# Patient Record
Sex: Female | Born: 1955 | Race: Black or African American | Hispanic: No | Marital: Married | Smoking: Never smoker
Health system: Southern US, Community
[De-identification: ages and names within clinical notes are randomized; demographics above are authoritative.]

## PROBLEM LIST (undated history)

## (undated) DIAGNOSIS — N200 Calculus of kidney: Secondary | ICD-10-CM

## (undated) DIAGNOSIS — E119 Type 2 diabetes mellitus without complications: Secondary | ICD-10-CM

## (undated) DIAGNOSIS — I10 Essential (primary) hypertension: Secondary | ICD-10-CM

## (undated) DIAGNOSIS — E7800 Pure hypercholesterolemia, unspecified: Secondary | ICD-10-CM

## (undated) HISTORY — PX: BREAST EXCISIONAL BIOPSY: SUR124

## (undated) HISTORY — PX: DILATION AND CURETTAGE OF UTERUS: SHX78

---

## 1997-09-10 HISTORY — PX: TUBAL LIGATION: SHX77

## 2000-02-22 ENCOUNTER — Ambulatory Visit (HOSPITAL_COMMUNITY): Admission: RE | Admit: 2000-02-22 | Discharge: 2000-02-22 | Payer: Self-pay | Admitting: Family Medicine

## 2000-02-22 ENCOUNTER — Encounter: Payer: Self-pay | Admitting: Family Medicine

## 2000-02-22 ENCOUNTER — Emergency Department (HOSPITAL_COMMUNITY): Admission: EM | Admit: 2000-02-22 | Discharge: 2000-02-23 | Payer: Self-pay | Admitting: Emergency Medicine

## 2006-08-06 ENCOUNTER — Encounter: Admission: RE | Admit: 2006-08-06 | Discharge: 2006-08-06 | Payer: Self-pay | Admitting: Family Medicine

## 2006-11-29 ENCOUNTER — Encounter: Admission: RE | Admit: 2006-11-29 | Discharge: 2006-11-29 | Payer: Self-pay | Admitting: Family Medicine

## 2007-08-11 ENCOUNTER — Encounter: Admission: RE | Admit: 2007-08-11 | Discharge: 2007-08-11 | Payer: Self-pay | Admitting: Family Medicine

## 2008-08-13 ENCOUNTER — Encounter: Admission: RE | Admit: 2008-08-13 | Discharge: 2008-08-13 | Payer: Self-pay | Admitting: Family Medicine

## 2008-09-27 ENCOUNTER — Ambulatory Visit (HOSPITAL_COMMUNITY): Admission: RE | Admit: 2008-09-27 | Discharge: 2008-09-27 | Payer: Self-pay | Admitting: Family Medicine

## 2008-10-08 ENCOUNTER — Inpatient Hospital Stay (HOSPITAL_COMMUNITY): Admission: AD | Admit: 2008-10-08 | Discharge: 2008-10-13 | Payer: Self-pay | Admitting: Gastroenterology

## 2008-11-06 ENCOUNTER — Emergency Department (HOSPITAL_COMMUNITY): Admission: EM | Admit: 2008-11-06 | Discharge: 2008-11-06 | Payer: Self-pay | Admitting: Emergency Medicine

## 2009-08-16 ENCOUNTER — Encounter: Admission: RE | Admit: 2009-08-16 | Discharge: 2009-08-16 | Payer: Self-pay | Admitting: Family Medicine

## 2010-08-17 ENCOUNTER — Encounter
Admission: RE | Admit: 2010-08-17 | Discharge: 2010-08-17 | Payer: Self-pay | Source: Home / Self Care | Admitting: Family Medicine

## 2010-10-01 ENCOUNTER — Encounter: Payer: Self-pay | Admitting: Family Medicine

## 2010-12-25 LAB — COMPREHENSIVE METABOLIC PANEL
ALT: 1141 U/L — ABNORMAL HIGH (ref 0–35)
ALT: 1400 U/L — ABNORMAL HIGH (ref 0–35)
AST: 1995 U/L — ABNORMAL HIGH (ref 0–37)
AST: 912 U/L — ABNORMAL HIGH (ref 0–37)
Alkaline Phosphatase: 159 U/L — ABNORMAL HIGH (ref 39–117)
Alkaline Phosphatase: 163 U/L — ABNORMAL HIGH (ref 39–117)
BUN: 7 mg/dL (ref 6–23)
CO2: 28 mEq/L (ref 19–32)
Calcium: 8.6 mg/dL (ref 8.4–10.5)
Calcium: 8.7 mg/dL (ref 8.4–10.5)
Chloride: 101 mEq/L (ref 96–112)
Creatinine, Ser: 0.9 mg/dL (ref 0.4–1.2)
GFR calc Af Amer: >60 mL/min (ref >60)
GFR calc Af Amer: >60 mL/min (ref >60)
GFR calc non Af Amer: >60 mL/min (ref >60)
Glucose, Bld: 107 mg/dL — ABNORMAL HIGH (ref 70–99)
Glucose, Bld: 163 mg/dL — ABNORMAL HIGH (ref 70–99)
Potassium: 3.2 mEq/L — ABNORMAL LOW (ref 3.5–5.1)
Potassium: 3.5 mEq/L (ref 3.5–5.1)
Potassium: 3.8 mEq/L (ref 3.5–5.1)
Sodium: 134 mEq/L — ABNORMAL LOW (ref 135–145)
Sodium: 134 mEq/L — ABNORMAL LOW (ref 135–145)
Total Bilirubin: 32.3 mg/dL (ref 0.3–1.2)
Total Bilirubin: 35 mg/dL (ref 0.3–1.2)
Total Protein: 6.9 g/dL (ref 6.0–8.3)
Total Protein: 7.3 g/dL (ref 6.0–8.3)

## 2010-12-25 LAB — ANA: Anti Nuclear Antibody(ANA): NEGATIVE

## 2010-12-25 LAB — CBC
HCT: 39.3 % (ref 36.0–46.0)
Hemoglobin: 13.3 g/dL (ref 12.0–15.0)
Hemoglobin: 13.7 g/dL (ref 12.0–15.0)
MCHC: 34.4 g/dL (ref 30.0–36.0)
MCV: 79.2 fL (ref 78.0–100.0)
RBC: 4.84 MIL/uL (ref 3.87–5.11)
RBC: 5.21 MIL/uL — ABNORMAL HIGH (ref 3.87–5.11)
RDW: 25.5 % — ABNORMAL HIGH (ref 11.5–15.5)
RDW: 26.6 % — ABNORMAL HIGH (ref 11.5–15.5)
WBC: 10.3 10*3/uL (ref 4.0–10.5)

## 2010-12-25 LAB — PROTIME-INR
INR: 1.4 (ref 0.00–1.49)
Prothrombin Time: 17.4 seconds — ABNORMAL HIGH (ref 11.6–15.2)
Prothrombin Time: 18.3 seconds — ABNORMAL HIGH (ref 11.6–15.2)

## 2010-12-25 LAB — APTT: aPTT: 37 seconds (ref 24–37)

## 2010-12-25 LAB — CLOSTRIDIUM DIFFICILE EIA: C difficile Toxins A+B, EIA: NEGATIVE

## 2010-12-25 LAB — AMMONIA: Ammonia: 21 umol/L (ref 11–35)

## 2010-12-26 LAB — COMPREHENSIVE METABOLIC PANEL
ALT: 816 U/L — ABNORMAL HIGH (ref 0–35)
ALT: 822 U/L — ABNORMAL HIGH (ref 0–35)
ALT: 915 U/L — ABNORMAL HIGH (ref 0–35)
AST: 496 U/L — ABNORMAL HIGH (ref 0–37)
AST: 590 U/L — ABNORMAL HIGH (ref 0–37)
AST: 70 U/L — ABNORMAL HIGH (ref 0–37)
Albumin: 2.2 g/dL — ABNORMAL LOW (ref 3.5–5.2)
Albumin: 2.3 g/dL — ABNORMAL LOW (ref 3.5–5.2)
Alkaline Phosphatase: 164 U/L — ABNORMAL HIGH (ref 39–117)
Alkaline Phosphatase: 170 U/L — ABNORMAL HIGH (ref 39–117)
Alkaline Phosphatase: 176 U/L — ABNORMAL HIGH (ref 39–117)
BUN: 14 mg/dL (ref 6–23)
BUN: 14 mg/dL (ref 6–23)
CO2: 25 mEq/L (ref 19–32)
CO2: 26 mEq/L (ref 19–32)
Calcium: 8.4 mg/dL (ref 8.4–10.5)
Calcium: 8.6 mg/dL (ref 8.4–10.5)
Calcium: 9.2 mg/dL (ref 8.4–10.5)
Chloride: 103 mEq/L (ref 96–112)
Chloride: 103 mEq/L (ref 96–112)
Chloride: 100 mEq/L (ref 96–112)
Creatinine, Ser: 1.03 mg/dL (ref 0.4–1.2)
Creatinine, Ser: 1.11 mg/dL (ref 0.4–1.2)
GFR calc Af Amer: 58 mL/min — ABNORMAL LOW (ref >60)
GFR calc Af Amer: >60 mL/min (ref >60)
GFR calc Af Amer: >60 mL/min (ref >60)
GFR calc non Af Amer: 56 mL/min — ABNORMAL LOW (ref >60)
GFR calc non Af Amer: 52 mL/min — ABNORMAL LOW (ref >60)
Glucose, Bld: 170 mg/dL — ABNORMAL HIGH (ref 70–99)
Glucose, Bld: 199 mg/dL — ABNORMAL HIGH (ref 70–99)
Glucose, Bld: 228 mg/dL — ABNORMAL HIGH (ref 70–99)
Glucose, Bld: 478 mg/dL — ABNORMAL HIGH (ref 70–99)
Potassium: 3.3 mEq/L — ABNORMAL LOW (ref 3.5–5.1)
Potassium: 3.3 mEq/L — ABNORMAL LOW (ref 3.5–5.1)
Potassium: 3.7 mEq/L (ref 3.5–5.1)
Sodium: 134 mEq/L — ABNORMAL LOW (ref 135–145)
Sodium: 134 mEq/L — ABNORMAL LOW (ref 135–145)
Total Bilirubin: 19.4 mg/dL (ref 0.3–1.2)
Total Bilirubin: 23.2 mg/dL (ref 0.3–1.2)
Total Bilirubin: 5.5 mg/dL — ABNORMAL HIGH (ref 0.3–1.2)
Total Protein: 6.8 g/dL (ref 6.0–8.3)
Total Protein: 6.9 g/dL (ref 6.0–8.3)

## 2010-12-26 LAB — DIFFERENTIAL
Basophils Absolute: 0 10*3/uL (ref 0.0–0.1)
Basophils Relative: 0 % (ref 0–1)
Eosinophils Absolute: 0 10*3/uL (ref 0.0–0.7)
Eosinophils Absolute: 0.1 10*3/uL (ref 0.0–0.7)
Eosinophils Relative: 0 % (ref 0–5)
Eosinophils Relative: 1 % (ref 0–5)
Lymphocytes Relative: 5 % — ABNORMAL LOW (ref 12–46)
Lymphocytes Relative: 23 % (ref 12–46)
Lymphs Abs: 1.1 10*3/uL (ref 0.7–4.0)
Lymphs Abs: 3.5 10*3/uL (ref 0.7–4.0)
Monocytes Absolute: 0.5 10*3/uL (ref 0.1–1.0)
Monocytes Relative: 2 % — ABNORMAL LOW (ref 3–12)
Neutro Abs: 21.1 10*3/uL — ABNORMAL HIGH (ref 1.7–7.7)
Neutrophils Relative %: 93 % — ABNORMAL HIGH (ref 43–77)
Neutrophils Relative %: 70 % (ref 43–77)

## 2010-12-26 LAB — URINALYSIS, ROUTINE W REFLEX MICROSCOPIC
Bilirubin Urine: NEGATIVE
Glucose, UA: >1000 mg/dL — AB
Hgb urine dipstick: NEGATIVE
Specific Gravity, Urine: 1.024 (ref 1.005–1.030)
Urobilinogen, UA: 2 mg/dL — ABNORMAL HIGH (ref 0.0–1.0)

## 2010-12-26 LAB — GLUCOSE, CAPILLARY
Glucose-Capillary: 160 mg/dL — ABNORMAL HIGH (ref 70–99)
Glucose-Capillary: 177 mg/dL — ABNORMAL HIGH (ref 70–99)
Glucose-Capillary: 268 mg/dL — ABNORMAL HIGH (ref 70–99)
Glucose-Capillary: 273 mg/dL — ABNORMAL HIGH (ref 70–99)
Glucose-Capillary: 291 mg/dL — ABNORMAL HIGH (ref 70–99)
Glucose-Capillary: 467 mg/dL — ABNORMAL HIGH (ref 70–99)

## 2010-12-26 LAB — PROTEIN ELECTROPH W RFLX QUANT IMMUNOGLOBULINS
Alpha-1-Globulin: 4.1 % (ref 2.9–4.9)
Beta 2: 6 % (ref 3.2–6.5)
Beta Globulin: 4.8 % (ref 4.7–7.2)
Gamma Globulin: 26.2 % — ABNORMAL HIGH (ref 11.1–18.8)

## 2010-12-26 LAB — CBC
HCT: 41 % (ref 36.0–46.0)
HCT: 33.2 % — ABNORMAL LOW (ref 36.0–46.0)
Hemoglobin: 14.2 g/dL (ref 12.0–15.0)
Hemoglobin: 14.2 g/dL (ref 12.0–15.0)
Hemoglobin: 11.7 g/dL — ABNORMAL LOW (ref 12.0–15.0)
MCHC: 34.3 g/dL (ref 30.0–36.0)
MCHC: 34.7 g/dL (ref 30.0–36.0)
MCHC: 34.7 g/dL (ref 30.0–36.0)
MCHC: 35.3 g/dL (ref 30.0–36.0)
MCV: 79.3 fL (ref 78.0–100.0)
MCV: 79.7 fL (ref 78.0–100.0)
MCV: 88.7 fL (ref 78.0–100.0)
Platelets: 381 10*3/uL (ref 150–400)
Platelets: 392 10*3/uL (ref 150–400)
RBC: 5.15 MIL/uL — ABNORMAL HIGH (ref 3.87–5.11)
RBC: 3.74 MIL/uL — ABNORMAL LOW (ref 3.87–5.11)
RDW: 27.4 % — ABNORMAL HIGH (ref 11.5–15.5)
RDW: 27.5 % — ABNORMAL HIGH (ref 11.5–15.5)
RDW: 27.5 % — ABNORMAL HIGH (ref 11.5–15.5)
WBC: 17.3 10*3/uL — ABNORMAL HIGH (ref 4.0–10.5)
WBC: 22.7 10*3/uL — ABNORMAL HIGH (ref 4.0–10.5)
WBC: 15.3 10*3/uL — ABNORMAL HIGH (ref 4.0–10.5)

## 2010-12-26 LAB — GIARDIA/CRYPTOSPORIDIUM SCREEN(EIA)
Cryptosporidium Screen (EIA): NEGATIVE
Giardia Screen - EIA: NEGATIVE

## 2010-12-26 LAB — CLOSTRIDIUM DIFFICILE EIA

## 2010-12-26 LAB — PROTIME-INR
INR: 1.2 (ref 0.00–1.49)
Prothrombin Time: 15.3 seconds — ABNORMAL HIGH (ref 11.6–15.2)

## 2010-12-26 LAB — URINE MICROSCOPIC-ADD ON

## 2011-01-23 NOTE — H&P (Signed)
Dana Thornton              ACCOUNT NO.:  [id number]   MEDICAL RECORD NO.:  [id number]          PATIENT TYPE:  INP   LOCATION:  5531                         FACILITY:  [facility]   PHYSICIAN:  Shirley Friar, MDDATE OF BIRTH:  [birth year]   DATE OF ADMISSION:  10/08/2008  DATE OF DISCHARGE:                              HISTORY & PHYSICAL   Dana Thornton is being admitted today.  She is a 55 year old female who  describes jaundice, abdominal pain, and extreme fatigue for about the  past 10 days to 2 weeks.  The patient reports abdominal pain only when  she has diarrhea.  She has been having about 5 diarrhea bowel movements  a day.  Several days ago, she was vomiting most all of her food, but is  able to keep some done now.  She denies any hematemesis as well as  hematochezia or melena.  She describes increased continuous nausea and  extreme fatigue.  She tells me her urine has been dark for about a week  and a half.  She denies any confusion whatsoever.   She was originally seen in the [facility] office by Dr. Madilyn Fireman on  October 04, 2008.  At that point in time, he found her LFTs to be  elevated with her transaminases in the 2000 and 3000 range.  Her  bilirubin was approximately 16.9.  On October 06, 2008, he retook her  LFTs.  AST was 2500, AST 1675, and total bilirubin 31.5.  Hepatitis  panel was negative.   PAST MEDICAL HISTORY:  Significant for hypertension, hypothyroidism,  hyperlipidemia, and diverticulosis.   CURRENT MEDICATIONS:  Vitamin D, Synthroid, Lipitor, and Accuretic.   She has an allergy to CODEINE.   REVIEW OF SYSTEMS:  As per HPI.  She denies any fever, palpitations, or  shortness of breath.   SOCIAL HISTORY:  Negative for alcohol, negative for tobacco, negative  for drugs.  She tells me that she specifically has never used IV drugs.   FAMILY HISTORY:  Negative for any history of liver disease or  gallbladder disease.   PHYSICAL EXAMINATION:  GENERAL:   She is alert and oriented, appears  fatigued.  HEENT:  Her eyes are icteric 3+.  CARDIOVASCULAR SYSTEM:  Regular rate and rhythm with no murmurs, rubs,  or gallops.  LUNGS:  Clear to auscultation anteriorly.  ABDOMEN:  Obese, soft, nontender, and nondistended with good bowel  sounds.  EXTREMITIES:  She has no lower extremity edema.   LABORATORY STUDIES:  From October 06, 2008, she has a smooth muscle  antibody that is positive at 37.  She has an ammonia level of 103 and  amylase 41.  Hepatitis panel negative.  Hemoglobin 13.9, hematocrit  40.7, white count 9.4, and platelets 274,000.  BMET within normal limits  including BUN of 8 and creatinine 0.9.  LFTs on October 06, 2008, show  AST 2564, ALT 1675, alk phos 205, and total bilirubin 31.5.  On October 04, 2008, her PT was 14.5, INR 1.4, and PTT 32.  Also, her iron sat was  44, TIBC 381, and serum iron  166.  She had an abdominal ultrasound on  September 27, 2008.  Her gallbladder was markedly thickened and irregular.  She had increased renal cortical echogenicity.  The radiologist states  that is consistent with medical parenchymal disease.   ASSESSMENT:  Dr. Charlott Rakes has seen and examined the patient,  collected a history, and reviewed her chart.  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  She had a recent development of jaundice,  markedly elevated transaminases, and rise in bilirubin, found to have an  elevated actin(anti-smooth muscle antibody) level.  Due to the degree of  her hyperbilirubinemia, I have chosen to admit her for IV steroids as  initial Rx for presumed autoimmune hepatitis.  She has evidence of  rising ammonia level, but no signs of encephalopathy thus far.      Stephani Police, [place]      Shirley Friar, MD  Electronically Signed    MLY/MEDQ  D:  10/08/2008  T:  10/09/2008  Job:  [id number]   cc:   Everardo All. Madilyn Fireman, M.D.

## 2011-01-23 NOTE — Consult Note (Signed)
NAMERYDER, CHESMORE              ACCOUNT NO.:  [id number]   MEDICAL RECORD NO.:  [id number]          PATIENT TYPE:  EMS   LOCATION:  MAJO                         FACILITY:  [facility]   PHYSICIAN:  Corinna L. Lendell Caprice, MDDATE OF BIRTH:  1955/12/15   DATE OF CONSULTATION:  11/06/2008  DATE OF DISCHARGE:  11/06/2008                                 CONSULTATION   CHIEF COMPLAINT:  High sugar.   REQUESTING PHYSICIAN:  Marisa Severin, MD   REASON FOR CONSULTATION:  Hyperglycemia.   IMPRESSION AND RECOMMENDATIONS:  1. Steroid-induced diabetes:  The patient is not in diabetic      ketoacidosis and she is currently fluid repleted.  Her sugars are      improved and she is requesting to go home with close outpatient      followup, I think, this is reasonable.  I will give her a dose of      Lantus as well as IV regular insulin and give her a prescription      for Januvia.  I have asked that she monitor for hypoglycemia and to      follow up with Dr. Valentina Lucks as soon as possible.  She is feeling      better and clinically at this point, does not appear dehydrated.  I      have recommended that she cut down on concentrated sweets and      continue her glimepiride 4 mg twice a day.  2. Autoimmune hepatitis.  3. Hypertension.  Hold Accuretic until blood sugars stabilize.   HISTORY OF PRESENT ILLNESS:  Ms. Berhe is a pleasant 55 year old black  female who was recently diagnosed with autoimmune hepatitis and has been  on prednisone.  She has some baseline labs drawn in Dr. Madilyn Fireman' office on  Wednesday and was told to follow up with Dr. Valentina Lucks.  Apparently, her  blood sugar was above 400.  She was started on glimepiride 4 mg twice a  day 2 days ago.  She had been on prednisone 60 mg a day, which was  decreased to 20 mg yesterday.  She has not taken her glimepiride today.  She had polyuria, polydipsia, blurry vision, dry mouth, but currently  these symptoms have abated.  Apparently, her blood sugars  have been  running 400 at home and she monitors her blood sugars twice daily.  She  has an appointment set up with Dr. Valentina Lucks on Wednesday.   PAST MEDICAL HISTORY:  As above.   MEDICATIONS:  Synthroid, prednisone, glimepiride, mercaptopurine, and  Accuretic.   SOCIAL HISTORY:  She works for [employer].  She does not smoke.  She denies  drug use or heavy alcohol use.   FAMILY HISTORY:  Noncontributory.   REVIEW OF SYSTEMS:  As above, otherwise negative.   PHYSICAL EXAMINATION:  VITAL SIGNS:  Temperature is 98, blood pressure  132/88, pulse 100, respiratory rate 18, and oxygen saturation 100%.  GENERAL:  The patient is an obese black female, in no acute distress.  HEENT:  Normocephalic, atraumatic.  Pupils equal, round, and reactive to  light.  She has icteric sclerae.  She has moist mucous membranes.  NECK:  Supple.  No lymphadenopathy.  LUNGS:  Clear to auscultation bilaterally without wheezes, rhonchi, or  rales.  CARDIOVASCULAR:  Regular rhythm, borderline tachycardiac.  No murmurs,  gallops, or rubs.  ABDOMEN:  Obese, soft, nontender.  GU AND RECTAL:  Deferred.  EXTREMITIES:  No clubbing, cyanosis, or edema.  SKIN:  No rash.  PSYCHIATRIC:  The patient is calm and cooperative with normal affect.  NEUROLOGIC:  Alert and oriented.  Cranial nerves and sensory motor exam  are intact.   LABORATORY DATA:  White count is 15,000 with a normal differential.  Hemoglobin 11.7, hematocrit 33.2.  Sodium is 134, potassium 3.9,  chloride 100, bicarbonate 26, glucose was 478, BUN 24, creatinine 1.1,  total bilirubin 5.5, alkaline phosphatase 190, SGOT 70, SGPT 127,  albumin 3.2.  blood acetone negative.  Urinalysis greater than 1000  glucose, specific gravity 1.024, urobilinogen 2, otherwise negative.  EKG shows sinus tachycardia with a rate of 105.   The patient voices understanding and I have given her my card, should  she run into problems this weekend.      Corinna L. Lendell Caprice,  MD  Electronically Signed     CLS/MEDQ  D:  11/06/2008  T:  11/06/2008  Job:  [id number]   cc:   Gretta Arab. Valentina Lucks, M.D.  John C. Madilyn Fireman, M.D.

## 2011-01-23 NOTE — Discharge Summary (Signed)
Dana Thornton, Dana Thornton              ACCOUNT NO.:  [id number]   MEDICAL RECORD NO.:  [id number]          PATIENT TYPE:  INP   LOCATION:  5531                         FACILITY:  [facility]   PHYSICIAN:  John C. Madilyn Fireman, M.D.    DATE OF BIRTH:  May 09, 1956   DATE OF ADMISSION:  10/08/2008  DATE OF DISCHARGE:  10/13/2008                               DISCHARGE SUMMARY   Dana Thornton was admitted to [facility] on October 08, 2008.  She is being discharged today, October 13, 2008.   DISCHARGE DIAGNOSES:  1. Autoimmune hepatitis.  2. Hypertension.   CONSULTS:  None.   PROCEDURES:  None.   RADIOLOGICAL EXAMS:  None.   PERTINENT LABORATORY DATA:  On the date of admission, Dana Thornton's AST  was 1995, her ALT was 1400, her alk phos was 159, and her total  bilirubin was 32.3.  From the office, she had an elevated actin level of  37.  Her ANA in the hospital was negative.   DISCHARGE LABORATORY DATA:  An AST of 496, ALT 816, alk phos 176, and  total bilirubin 19.  Her CBC today showed a white count of 19.6,  hemoglobin 14.2, hematocrit 41.0, and platelets 398,000.  Glucose ran  between 175 and 200 throughout the admission, her potassium today is  3.7, BUN 20, and creatinine 1.19.   HISTORY AND BRIEF HOSPITAL COURSE:  Dana Thornton is a very pleasant 55-  year-old female who is a patient of Dr. Dorena Cookey at [facility] as  well as Dr. Maurice Small at [facility].  She had been  complaining of jaundice, abdominal pain, and extreme fatigue for about  10 days to 2 weeks.  She has seen Dr. Madilyn Fireman multiple times in the office  just 2-4 days prior to admission and he had been drawing labs, found her  LFTs to be markedly elevated and found her actin level to be elevated,  at which time he admitted her to the hospital for therapy with IV  steroids for autoimmune hepatitis.  She was admitted to the hospital,  still having a good deal of nausea.  She was having multiple loose bowel  movements daily.  Stool cultures were taken.  Giardia and  Cryptosporidium was negative.  C. diff was negative x3.  Over the course  of her hospitalization, her stools firmed up in decreased in number.  She was also extremely nauseated and felt weak and tired on admission.  Over the course of her hospital stay, her nausea dissipated and she  regained some strength.  She was able to tolerate a p.o. diet  immediately.  By the date of discharge, she was able to tolerate all  p.o. medications.  Her admission was rather noneventful in that no  procedures were done.  However, she was treated with 80 mg of Solu-  Medrol IV t.i.d. and her labs immediately started to improve.  It was  noted, however, that her CBG ran between 75 and the low 200s.  She was  started on insulin for this.  On the date of discharge, the patient  was  stable in good condition.  She had no abdominal pain.  She was  ambulating well.  Her bowel movements had normalized.  Her eyes were  still very icteric.  She never showed signs of encephalopathy during  this hospital admission.  She has no lower extremity edema.  Denies any  abdominal pain and is eating well.  She is being discharged to home in  stable condition.  She will have close followup with [facility].  Her  first appointment for labs will be on October 15, 2008, this Friday,  following that she has an office visit with Dr. Dorena Cookey on October 25, 2008.  We will plan for her to return to work approximately on  October 26, 2008.   DISCHARGE MEDICATIONS:  Prednisone 60 mg orally once a day until she is  seen by Dr. Madilyn Fireman.  Her Lipitor is being held.  Her vitamin D is being  held.  Accuretic blood pressure medication 20-12.5 twice a day by mouth  and lisinopril 20 mg once a day by mouth.   The patient has been advised to call our office with any increased  abdominal pain or vomiting prior to her office visit.      Stephani Police, PA     ______________________________  Everardo All Madilyn Fireman, M.D.    MLY/MEDQ  D:  10/13/2008  T:  10/14/2008  Job:  [id number]   cc:   Gretta Arab. Valentina Lucks, M.D.  John C. Madilyn Fireman, M.D.

## 2011-08-22 ENCOUNTER — Other Ambulatory Visit: Payer: Self-pay | Admitting: Family Medicine

## 2011-08-22 DIAGNOSIS — Z1231 Encounter for screening mammogram for malignant neoplasm of breast: Secondary | ICD-10-CM

## 2011-08-30 ENCOUNTER — Ambulatory Visit
Admission: RE | Admit: 2011-08-30 | Discharge: 2011-08-30 | Disposition: A | Discharge status: Home / Self Care | Payer: 59 | Source: Ambulatory Visit | Attending: Family Medicine | Admitting: Family Medicine

## 2011-08-30 DIAGNOSIS — Z1231 Encounter for screening mammogram for malignant neoplasm of breast: Secondary | ICD-10-CM

## 2011-12-02 ENCOUNTER — Other Ambulatory Visit: Payer: Self-pay

## 2011-12-02 ENCOUNTER — Emergency Department (HOSPITAL_COMMUNITY): Payer: 59

## 2011-12-02 ENCOUNTER — Encounter (HOSPITAL_COMMUNITY): Payer: Self-pay | Admitting: Emergency Medicine

## 2011-12-02 ENCOUNTER — Inpatient Hospital Stay (HOSPITAL_COMMUNITY)
Admission: EM | Admit: 2011-12-02 | Discharge: 2011-12-10 | DRG: 208 | Disposition: A | Discharge status: Home Health | Payer: 59 | Source: Ambulatory Visit | Attending: Internal Medicine | Admitting: Internal Medicine

## 2011-12-02 DIAGNOSIS — J690 Pneumonitis due to inhalation of food and vomit: Secondary | ICD-10-CM | POA: Diagnosis present

## 2011-12-02 DIAGNOSIS — K759 Inflammatory liver disease, unspecified: Secondary | ICD-10-CM

## 2011-12-02 DIAGNOSIS — J9600 Acute respiratory failure, unspecified whether with hypoxia or hypercapnia: Principal | ICD-10-CM | POA: Diagnosis present

## 2011-12-02 DIAGNOSIS — N179 Acute kidney failure, unspecified: Secondary | ICD-10-CM | POA: Diagnosis present

## 2011-12-02 DIAGNOSIS — E1165 Type 2 diabetes mellitus with hyperglycemia: Secondary | ICD-10-CM | POA: Diagnosis present

## 2011-12-02 DIAGNOSIS — J9601 Acute respiratory failure with hypoxia: Secondary | ICD-10-CM | POA: Diagnosis present

## 2011-12-02 DIAGNOSIS — K754 Autoimmune hepatitis: Secondary | ICD-10-CM | POA: Diagnosis present

## 2011-12-02 DIAGNOSIS — Z9119 Patient's noncompliance with other medical treatment and regimen: Secondary | ICD-10-CM

## 2011-12-02 DIAGNOSIS — E1310 Other specified diabetes mellitus with ketoacidosis without coma: Secondary | ICD-10-CM | POA: Diagnosis present

## 2011-12-02 DIAGNOSIS — E669 Obesity, unspecified: Secondary | ICD-10-CM | POA: Diagnosis present

## 2011-12-02 DIAGNOSIS — D696 Thrombocytopenia, unspecified: Secondary | ICD-10-CM | POA: Diagnosis present

## 2011-12-02 DIAGNOSIS — E119 Type 2 diabetes mellitus without complications: Secondary | ICD-10-CM | POA: Diagnosis present

## 2011-12-02 DIAGNOSIS — Z6841 Body Mass Index (BMI) 40.0 and over, adult: Secondary | ICD-10-CM

## 2011-12-02 DIAGNOSIS — K7682 Hepatic encephalopathy: Secondary | ICD-10-CM | POA: Diagnosis present

## 2011-12-02 DIAGNOSIS — IMO0002 Reserved for concepts with insufficient information to code with codable children: Secondary | ICD-10-CM | POA: Diagnosis present

## 2011-12-02 DIAGNOSIS — K7290 Hepatic failure, unspecified without coma: Secondary | ICD-10-CM | POA: Diagnosis present

## 2011-12-02 DIAGNOSIS — E1159 Type 2 diabetes mellitus with other circulatory complications: Secondary | ICD-10-CM | POA: Diagnosis present

## 2011-12-02 DIAGNOSIS — T380X5A Adverse effect of glucocorticoids and synthetic analogues, initial encounter: Secondary | ICD-10-CM | POA: Diagnosis present

## 2011-12-02 DIAGNOSIS — E870 Hyperosmolality and hypernatremia: Secondary | ICD-10-CM | POA: Diagnosis present

## 2011-12-02 DIAGNOSIS — Z91199 Patient's noncompliance with other medical treatment and regimen due to unspecified reason: Secondary | ICD-10-CM

## 2011-12-02 DIAGNOSIS — I10 Essential (primary) hypertension: Secondary | ICD-10-CM | POA: Diagnosis present

## 2011-12-02 DIAGNOSIS — E860 Dehydration: Secondary | ICD-10-CM

## 2011-12-02 DIAGNOSIS — R739 Hyperglycemia, unspecified: Secondary | ICD-10-CM

## 2011-12-02 DIAGNOSIS — N19 Unspecified kidney failure: Secondary | ICD-10-CM

## 2011-12-02 DIAGNOSIS — E1110 Type 2 diabetes mellitus with ketoacidosis without coma: Secondary | ICD-10-CM

## 2011-12-02 DIAGNOSIS — R17 Unspecified jaundice: Secondary | ICD-10-CM | POA: Diagnosis present

## 2011-12-02 HISTORY — DX: Essential (primary) hypertension: I10

## 2011-12-02 LAB — POCT I-STAT 3, ART BLOOD GAS (G3+)
Patient temperature: 34.7
pCO2 arterial: 45.5 mmHg — ABNORMAL HIGH (ref 35.0–45.0)
pH, Arterial: 7.345 — ABNORMAL LOW (ref 7.350–7.400)

## 2011-12-02 LAB — CARDIAC PANEL(CRET KIN+CKTOT+MB+TROPI): Total CK: 795 U/L — ABNORMAL HIGH (ref 7–177)

## 2011-12-02 LAB — COMPREHENSIVE METABOLIC PANEL
ALT: 143 U/L — ABNORMAL HIGH (ref 0–35)
BUN: 84 mg/dL — ABNORMAL HIGH (ref 6–23)
CO2: 21 mEq/L (ref 19–32)
Calcium: 9.5 mg/dL (ref 8.4–10.5)
Creatinine, Ser: 3.59 mg/dL — ABNORMAL HIGH (ref 0.50–1.10)
GFR calc Af Amer: 15 mL/min — ABNORMAL LOW (ref >90)
GFR calc non Af Amer: 13 mL/min — ABNORMAL LOW (ref >90)
Glucose, Bld: 1055 mg/dL (ref 70–99)
Sodium: 151 mEq/L — ABNORMAL HIGH (ref 135–145)
Total Protein: 7.8 g/dL (ref 6.0–8.3)

## 2011-12-02 LAB — CBC
HCT: 44.8 % (ref 36.0–46.0)
Hemoglobin: 14.4 g/dL (ref 12.0–15.0)
MCH: 29 pg (ref 26.0–34.0)
MCHC: 32.1 g/dL (ref 30.0–36.0)
MCV: 90.3 fL (ref 78.0–100.0)
RBC: 4.96 MIL/uL (ref 3.87–5.11)

## 2011-12-02 LAB — DIFFERENTIAL
Basophils Relative: 0 % (ref 0–1)
Eosinophils Absolute: 0 10*3/uL (ref 0.0–0.7)
Eosinophils Relative: 0 % (ref 0–5)
Lymphs Abs: 2.3 10*3/uL (ref 0.7–4.0)
Monocytes Absolute: 0.5 10*3/uL (ref 0.1–1.0)
Neutro Abs: 22.6 10*3/uL — ABNORMAL HIGH (ref 1.7–7.7)

## 2011-12-02 LAB — URINALYSIS, ROUTINE W REFLEX MICROSCOPIC
Leukocytes, UA: NEGATIVE
Nitrite: NEGATIVE
Specific Gravity, Urine: 1.026 (ref 1.005–1.030)
Urobilinogen, UA: 0.2 mg/dL (ref 0.0–1.0)
pH: 5.5 (ref 5.0–8.0)

## 2011-12-02 LAB — GLUCOSE, CAPILLARY
Glucose-Capillary: >600 mg/dL (ref 70–99)
Glucose-Capillary: >600 mg/dL (ref 70–99)

## 2011-12-02 LAB — URINE MICROSCOPIC-ADD ON

## 2011-12-02 LAB — LACTIC ACID, PLASMA: Lactic Acid, Venous: 6 mmol/L — ABNORMAL HIGH (ref 0.5–2.2)

## 2011-12-02 MED ORDER — LORAZEPAM 2 MG/ML IJ SOLN
INTRAMUSCULAR | Status: AC
  Filled 2011-12-02: qty 1

## 2011-12-02 MED ORDER — DEXTROSE-NACL 5-0.45 % IV SOLN: INTRAVENOUS | Status: DC

## 2011-12-02 MED ORDER — SODIUM CHLORIDE 0.9 % IV BOLUS (SEPSIS)
1000.0000 mL | Freq: Once | INTRAVENOUS | Status: AC
  Administered 2011-12-02: 1000 mL via INTRAVENOUS

## 2011-12-02 MED ORDER — SODIUM CHLORIDE 0.9 % IV SOLN
INTRAVENOUS | Status: AC
  Administered 2011-12-02: 5.4 [IU]/h via INTRAVENOUS
  Filled 2011-12-02 (×2): qty 1

## 2011-12-02 MED ORDER — INSULIN REGULAR BOLUS VIA INFUSION
0.0000 [IU] | Freq: Three times a day (TID) | INTRAVENOUS | Status: DC
  Administered 2011-12-02: 10 [IU] via INTRAVENOUS
  Filled 2011-12-02: qty 10

## 2011-12-02 MED ORDER — INSULIN ASPART 100 UNIT/ML ~~LOC~~ SOLN
SUBCUTANEOUS | Status: AC
  Filled 2011-12-02: qty 1

## 2011-12-02 MED ORDER — SODIUM CHLORIDE 0.9 % IV SOLN
Freq: Once | INTRAVENOUS | Status: AC
  Administered 2011-12-03: 500 mL via INTRAVENOUS

## 2011-12-02 MED ORDER — DEXTROSE 50 % IV SOLN: 25.0000 mL | INTRAVENOUS | Status: DC | PRN

## 2011-12-02 MED ORDER — LORAZEPAM 2 MG/ML IJ SOLN
2.0000 mg | Freq: Once | INTRAMUSCULAR | Status: AC
  Administered 2011-12-02: 2 mg via INTRAVENOUS

## 2011-12-02 MED ORDER — SODIUM CHLORIDE 0.9 % IV SOLN
INTRAVENOUS | Status: DC
  Administered 2011-12-02: 23:00:00 via INTRAVENOUS

## 2011-12-02 NOTE — ED Notes (Signed)
Pt moaning, lethargic,arousable  to painful stimulation,  Pupils equal and regular and sluggish to reaction, open eyes to pain, pt on 100% NRB. Unable to pick sat, ext cold, foley temp 92.0, pt placed on warm blanket.pt MAE x4,

## 2011-12-02 NOTE — ED Notes (Signed)
Pt on bear hunger now

## 2011-12-02 NOTE — ED Provider Notes (Addendum)
History     CSN: [id number]  Arrival date & time 12/02/11  2201   First MD Initiated Contact with Patient 12/02/11 2205      Chief Complaint  Patient presents with  . Hyperglycemia    (Consider location/radiation/quality/duration/timing/severity/associated sxs/prior treatment) HPI Comments: Hx obtained per husband.  Pt has had 3-4 day hx of nausea/vomiting/upper abd pain and yellowing of her eyes.  Has a hx of autoimmune hepatitis and steroid induced DM.  Saw her PMD 2 days ago, given rx for vicodin and phenergan.  Has not taken much of the medicine.  Got worse today, was not responding well, has not been eating or drinking, not talking.  No hx of fevers at home.  No headaches.  Mild cold symptoms.  No hx of neck or back pain per husband.  The history is provided by the spouse. The history is limited by the condition of the patient.    Past Medical History  Diagnosis Date  . Diabetes mellitus   . Hypertension     History reviewed. No pertinent past surgical history.  No family history on file.  History  Substance Use Topics  . Smoking status: Not on file  . Smokeless tobacco: Not on file  . Alcohol Use:     OB History    Grav Para Term Preterm Abortions TAB SAB Ect Mult Living                  Review of Systems  Unable to perform ROS Eyes: Negative.     Allergies  Review of patient's allergies indicates no known allergies.  Home Medications   Current Outpatient Rx  Name Route Sig Dispense Refill  . HYDROCODONE-ACETAMINOPHEN 5-325 MG PO TABS Oral Take 1 tablet by mouth every 4 (four) hours as needed. For pain    . PROMETHAZINE HCL 25 MG PO TABS Oral Take 25 mg by mouth every 4 (four) hours as needed. For nausea from pain meds.      BP 145/85  Pulse 117  Temp(Src) 97.2 F (36.2 C) (Other (Comment))  Resp 20  SpO2 100%  Physical Exam  Constitutional: She appears well-developed and well-nourished.       Pt awake with eyes open, will follow commands, but  no verbal response  HENT:  Head: Normocephalic and atraumatic.       Dry mucus membranes  Eyes: Pupils are equal, round, and reactive to light. Scleral icterus is present.  Neck: Normal range of motion. Neck supple.  Cardiovascular: Normal rate, regular rhythm and normal heart sounds.   No murmur heard. Pulmonary/Chest: Effort normal and breath sounds normal. No respiratory distress. She has no wheezes. She has no rales. She exhibits no tenderness.  Abdominal: Soft. Bowel sounds are normal. There is no tenderness. There is no rebound and no guarding.  Musculoskeletal: Normal range of motion. She exhibits no edema.  Lymphadenopathy:    She has no cervical adenopathy.  Neurological: She is alert.       Will follow simple commands.  Moves all extremities symmetrically.  No facial droop, can stick out tongue, normal gag.  Noncomprehensible verbal response  Skin: Skin is warm and dry. No rash noted. She is not diaphoretic.  Psychiatric: She has a normal mood and affect.    ED Course  Procedures (including critical care time)  Results for orders placed during the hospital encounter of 12/02/11  CBC      Component Value Range   WBC 25.4 (*) 4.0 -  10.5 (K/uL)   RBC 4.96  3.87 - 5.11 (MIL/uL)   Hemoglobin 14.4  12.0 - 15.0 (g/dL)   HCT 40.9  81.1 - 91.4 (%)   MCV 90.3  78.0 - 100.0 (fL)   MCH 29.0  26.0 - 34.0 (pg)   MCHC 32.1  30.0 - 36.0 (g/dL)   RDW 78.2 (*) 95.6 - 15.5 (%)   Platelets 297  150 - 400 (K/uL)  DIFFERENTIAL      Component Value Range   Neutrophils Relative 89 (*) 43 - 77 (%)   Lymphocytes Relative 9 (*) 12 - 46 (%)   Monocytes Relative 2 (*) 3 - 12 (%)   Eosinophils Relative 0  0 - 5 (%)   Basophils Relative 0  0 - 1 (%)   Neutro Abs 22.6 (*) 1.7 - 7.7 (K/uL)   Lymphs Abs 2.3  0.7 - 4.0 (K/uL)   Monocytes Absolute 0.5  0.1 - 1.0 (K/uL)   Eosinophils Absolute 0.0  0.0 - 0.7 (K/uL)   Basophils Absolute 0.0  0.0 - 0.1 (K/uL)   WBC Morphology MILD LEFT SHIFT (1-5%  METAS, OCC MYELO, OCC BANDS)    COMPREHENSIVE METABOLIC PANEL      Component Value Range   Sodium 151 (*) 135 - 145 (mEq/L)   Potassium 5.6 (*) 3.5 - 5.1 (mEq/L)   Chloride 109  96 - 112 (mEq/L)   CO2 21  19 - 32 (mEq/L)   Glucose, Bld 1055 (*) 70 - 99 (mg/dL)   BUN 84 (*) 6 - 23 (mg/dL)   Creatinine, Ser 2.13 (*) 0.50 - 1.10 (mg/dL)   Calcium 9.5  8.4 - 08.6 (mg/dL)   Total Protein 7.8  6.0 - 8.3 (g/dL)   Albumin 3.4 (*) 3.5 - 5.2 (g/dL)   AST 47 (*) 0 - 37 (U/L)   ALT 143 (*) 0 - 35 (U/L)   Alkaline Phosphatase 172 (*) 39 - 117 (U/L)   Total Bilirubin 2.3 (*) 0.3 - 1.2 (mg/dL)   GFR calc non Af Amer 13 (*) >90 (mL/min)   GFR calc Af Amer 15 (*) >90 (mL/min)  URINALYSIS, ROUTINE W REFLEX MICROSCOPIC      Component Value Range   Color, Urine YELLOW  YELLOW    APPearance CLOUDY (*) CLEAR    Specific Gravity, Urine 1.026  1.005 - 1.030    pH 5.5  5.0 - 8.0    Glucose, UA >1000 (*) NEGATIVE (mg/dL)   Hgb urine dipstick LARGE (*) NEGATIVE    Bilirubin Urine SMALL (*) NEGATIVE    Ketones, ur NEGATIVE  NEGATIVE (mg/dL)   Protein, ur 578 (*) NEGATIVE (mg/dL)   Urobilinogen, UA 0.2  0.0 - 1.0 (mg/dL)   Nitrite NEGATIVE  NEGATIVE    Leukocytes, UA NEGATIVE  NEGATIVE   PREGNANCY, URINE      Component Value Range   Preg Test, Ur NEGATIVE  NEGATIVE   CARDIAC PANEL(CRET KIN+CKTOT+MB+TROPI)      Component Value Range   Total CK 795 (*) 7 - 177 (U/L)   CK, MB 2.7  0.3 - 4.0 (ng/mL)   Troponin I <0.30  <0.30 (ng/mL)   Relative Index 0.3  0.0 - 2.5   LACTIC ACID, PLASMA      Component Value Range   Lactic Acid, Venous 6.0 (*) 0.5 - 2.2 (mmol/L)  GLUCOSE, CAPILLARY      Component Value Range   Glucose-Capillary >600 (*) 70 - 99 (mg/dL)   Comment 1 Documented in Chart  Comment 2 Notify RN    POCT I-STAT 3, BLOOD GAS (G3+)      Component Value Range   pH, Arterial 7.345 (*) 7.350 - 7.400    pCO2 arterial 45.5 (*) 35.0 - 45.0 (mmHg)   pO2, Arterial 369.0 (*) 80.0 - 100.0 (mmHg)    Bicarbonate 25.5 (*) 20.0 - 24.0 (mEq/L)   TCO2 27  0 - 100 (mmol/L)   O2 Saturation 100.0     Acid-base deficit 1.0  0.0 - 2.0 (mmol/L)   Patient temperature 34.7 C     Collection site BRACHIAL ARTERY     Drawn by RT     Sample type ARTERIAL    URINE MICROSCOPIC-ADD ON      Component Value Range   Squamous Epithelial / LPF FEW (*) RARE    RBC / HPF 3-6  <3 (RBC/hpf)   Bacteria, UA MANY (*) RARE    Casts GRANULAR CAST (*) NEGATIVE    Urine-Other AMORPHOUS MATERIAL PRESENT    GLUCOSE, CAPILLARY      Component Value Range   Glucose-Capillary >600 (*) 70 - 99 (mg/dL)   Ct Head Wo Contrast  12/02/2011  *RADIOLOGY REPORT*  Clinical Data: 56 year old female with altered mental status.  CT HEAD WITHOUT CONTRAST  Technique:  Contiguous axial images were obtained from the base of the skull through the vertex without contrast.  Comparison: None  Findings: No acute intracranial abnormalities are identified, including mass lesion or mass effect, hydrocephalus, extra-axial fluid collection, midline shift, hemorrhage, or acute infarction.  The visualized bony calvarium is unremarkable.  IMPRESSION: No evidence of acute intracranial abnormality.  Original Report Authenticated By: Rosendo Gros, M.D.   Dg Chest Port 1 View  12/02/2011  *RADIOLOGY REPORT*  Clinical Data: 56 year old female with altered mental status.  PORTABLE CHEST - 1 VIEW  Comparison: None  Findings: Upper limits normal heart size and mild pulmonary vascular congestion noted. There is no evidence of focal airspace disease, pulmonary edema, suspicious pulmonary nodule/mass, pleural effusion, or pneumothorax. No acute bony abnormalities are identified.  IMPRESSION: Upper limits normal heart size and mild pulmonary vascular congestion.  Original Report Authenticated By: Rosendo Gros, M.D.      Date: 12/03/2011  Rate: 119  Rhythm: normal sinus rhythm and sinus tachycardia  QRS Axis: normal  Intervals: normal  ST/T Wave abnormalities:  nonspecific ST/T changes  Conduction Disutrbances:none  Narrative Interpretation:   Old EKG Reviewed: changes noted      1. Hyperglycemia   2. Renal failure   3. Dehydration       MDM  Pt with critically high BS, not acidodic, but lactate high.  Mild elevation in liver enzymes.  Was hypothermic on arrival, but temp improving with [employer].  Started IV fluid replacement.  Starting to have some UOP, about 200cc now.  Given 2 liter bolus, now at 150cc/hr given some pulmonary vascular congestion CXR.  On glucose stabilizer orders.  Pt controlling airway, but still confused.  No hx of fevers, or other recent illness to suggest sepsis.  No hypotension.  Feel that pt is more likely sick from hyperosmolar, nonketotic hyperglycemia.  In ARF with hypothermia, dehydration.  Will consult hospitalist for admission. Will cover pt with abx  Spoke with intensivist, will see pt in ED.  Requests CT abd/pelvis which I ordered.  I attempted CVL, but unsuccessful, pt combative.  Given ativan for combative behavior and fentanyl for pain during procedure.  CRITICAL CARE Performed by: Rolan Bucco  Total critical care time: 60  Critical care time was exclusive of separately billable procedures and treating other patients.  Critical care was necessary to treat or prevent imminent or life-threatening deterioration.  Critical care was time spent personally by me on the following activities: development of treatment plan with patient and/or surrogate as well as nursing, discussions with consultants, evaluation of patient's response to treatment, examination of patient, obtaining history from patient or surrogate, ordering and performing treatments and interventions, ordering and review of laboratory studies, ordering and review of radiographic studies, pulse oximetry and re-evaluation of patient's condition.         Rolan Bucco, MD 12/03/11 0030  Rolan Bucco, MD 12/03/11 1610  Rolan Bucco, MD 12/03/11 [id number]

## 2011-12-02 NOTE — ED Notes (Addendum)
Critical glucose level 1055mg /dL called Md notified

## 2011-12-02 NOTE — ED Notes (Signed)
Patient transported to CT 

## 2011-12-02 NOTE — ED Notes (Signed)
Pt husband stated pt has been unresponsive expect to pain for the last 8hrs., has not eaten or drink anything for more than 2DAYS, CBG >600 .

## 2011-12-02 NOTE — ED Notes (Signed)
Pt very restless, pulling thing out, more confused, trashing in bed,  MD notified in room now assessing pt.

## 2011-12-03 ENCOUNTER — Other Ambulatory Visit: Payer: Self-pay

## 2011-12-03 ENCOUNTER — Encounter (HOSPITAL_COMMUNITY): Payer: Self-pay | Admitting: Emergency Medicine

## 2011-12-03 ENCOUNTER — Inpatient Hospital Stay (HOSPITAL_COMMUNITY): Payer: 59

## 2011-12-03 ENCOUNTER — Emergency Department (HOSPITAL_COMMUNITY): Payer: 59

## 2011-12-03 DIAGNOSIS — K754 Autoimmune hepatitis: Secondary | ICD-10-CM | POA: Diagnosis present

## 2011-12-03 DIAGNOSIS — E1069 Type 1 diabetes mellitus with other specified complication: Secondary | ICD-10-CM

## 2011-12-03 DIAGNOSIS — E1165 Type 2 diabetes mellitus with hyperglycemia: Secondary | ICD-10-CM | POA: Diagnosis present

## 2011-12-03 DIAGNOSIS — R7309 Other abnormal glucose: Secondary | ICD-10-CM

## 2011-12-03 DIAGNOSIS — E1065 Type 1 diabetes mellitus with hyperglycemia: Secondary | ICD-10-CM

## 2011-12-03 DIAGNOSIS — J9600 Acute respiratory failure, unspecified whether with hypoxia or hypercapnia: Secondary | ICD-10-CM

## 2011-12-03 DIAGNOSIS — J9601 Acute respiratory failure with hypoxia: Secondary | ICD-10-CM | POA: Diagnosis present

## 2011-12-03 DIAGNOSIS — E860 Dehydration: Secondary | ICD-10-CM

## 2011-12-03 LAB — BLOOD GAS, ARTERIAL
Drawn by: 34779
MECHVT: 400 mL
O2 Saturation: 97.9 %
PEEP: 5 cmH2O
Patient temperature: 97.9
RATE: 14 resp/min

## 2011-12-03 LAB — POCT I-STAT, CHEM 8
BUN: 79 mg/dL — ABNORMAL HIGH (ref 6–23)
Hemoglobin: 13.3 g/dL (ref 12.0–15.0)
Potassium: 3.6 mEq/L (ref 3.5–5.1)
Sodium: 166 mEq/L (ref 135–145)
TCO2: 21 mmol/L (ref 0–100)

## 2011-12-03 LAB — SEDIMENTATION RATE: Sed Rate: 23 mm/hr — ABNORMAL HIGH (ref 0–22)

## 2011-12-03 LAB — COMPREHENSIVE METABOLIC PANEL
Albumin: 2.8 g/dL — ABNORMAL LOW (ref 3.5–5.2)
BUN: 76 mg/dL — ABNORMAL HIGH (ref 6–23)
Chloride: 129 mEq/L — ABNORMAL HIGH (ref 96–112)
Creatinine, Ser: 2.84 mg/dL — ABNORMAL HIGH (ref 0.50–1.10)
GFR calc Af Amer: 20 mL/min — ABNORMAL LOW (ref >90)
Glucose, Bld: 168 mg/dL — ABNORMAL HIGH (ref 70–99)
Total Bilirubin: 1.5 mg/dL — ABNORMAL HIGH (ref 0.3–1.2)
Total Protein: 6.9 g/dL (ref 6.0–8.3)

## 2011-12-03 LAB — BASIC METABOLIC PANEL
CO2: 25 mEq/L (ref 19–32)
Calcium: 8.5 mg/dL (ref 8.4–10.5)
Calcium: 8.4 mg/dL (ref 8.4–10.5)
Chloride: 130 mEq/L — ABNORMAL HIGH (ref 96–112)
Chloride: 128 mEq/L — ABNORMAL HIGH (ref 96–112)
Creatinine, Ser: 2.52 mg/dL — ABNORMAL HIGH (ref 0.50–1.10)
GFR calc Af Amer: 24 mL/min — ABNORMAL LOW (ref >90)
GFR calc Af Amer: 28 mL/min — ABNORMAL LOW (ref >90)
GFR calc non Af Amer: 21 mL/min — ABNORMAL LOW (ref >90)
GFR calc non Af Amer: 24 mL/min — ABNORMAL LOW (ref >90)
Potassium: 4.5 mEq/L (ref 3.5–5.1)
Potassium: 4.1 mEq/L (ref 3.5–5.1)
Sodium: 164 mEq/L (ref 135–145)
Sodium: 162 mEq/L (ref 135–145)
Sodium: 161 mEq/L (ref 135–145)

## 2011-12-03 LAB — GLUCOSE, CAPILLARY
Glucose-Capillary: >600 mg/dL (ref 70–99)
Glucose-Capillary: 310 mg/dL — ABNORMAL HIGH (ref 70–99)
Glucose-Capillary: 149 mg/dL — ABNORMAL HIGH (ref 70–99)
Glucose-Capillary: 187 mg/dL — ABNORMAL HIGH (ref 70–99)
Glucose-Capillary: 108 mg/dL — ABNORMAL HIGH (ref 70–99)
Glucose-Capillary: 141 mg/dL — ABNORMAL HIGH (ref 70–99)
Glucose-Capillary: 187 mg/dL — ABNORMAL HIGH (ref 70–99)

## 2011-12-03 LAB — MAGNESIUM: Magnesium: 2.8 mg/dL — ABNORMAL HIGH (ref 1.5–2.5)

## 2011-12-03 LAB — MRSA PCR SCREENING: MRSA by PCR: NEGATIVE

## 2011-12-03 LAB — KETONES, QUALITATIVE: Acetone, Bld: NEGATIVE

## 2011-12-03 LAB — PHOSPHORUS: Phosphorus: 3.1 mg/dL (ref 2.3–4.6)

## 2011-12-03 LAB — LACTIC ACID, PLASMA: Lactic Acid, Venous: 1.2 mmol/L (ref 0.5–2.2)

## 2011-12-03 MED ORDER — MIDAZOLAM HCL 2 MG/2ML IJ SOLN: 2.0000 mg | Freq: Once | INTRAMUSCULAR | Status: DC

## 2011-12-03 MED ORDER — ETOMIDATE 2 MG/ML IV SOLN
INTRAVENOUS | Status: AC
  Filled 2011-12-03: qty 20

## 2011-12-03 MED ORDER — MIDAZOLAM HCL 2 MG/2ML IJ SOLN
2.0000 mg | INTRAMUSCULAR | Status: DC | PRN
  Administered 2011-12-03: 2 mg via INTRAVENOUS

## 2011-12-03 MED ORDER — MIDAZOLAM HCL 2 MG/2ML IJ SOLN
INTRAMUSCULAR | Status: AC
  Administered 2011-12-03: 01:00:00
  Filled 2011-12-03: qty 2

## 2011-12-03 MED ORDER — INSULIN ASPART 100 UNIT/ML ~~LOC~~ SOLN
0.0000 [IU] | SUBCUTANEOUS | Status: DC
  Administered 2011-12-03: 5 [IU] via SUBCUTANEOUS
  Administered 2011-12-03: 11 [IU] via SUBCUTANEOUS
  Administered 2011-12-04 (×2): 15 [IU] via SUBCUTANEOUS

## 2011-12-03 MED ORDER — OSMOLITE 1.5 CAL PO LIQD
1000.0000 mL | ORAL | Status: DC
  Administered 2011-12-03 – 2011-12-04 (×2): 1000 mL
  Filled 2011-12-03 (×4): qty 1000

## 2011-12-03 MED ORDER — PANTOPRAZOLE SODIUM 40 MG IV SOLR
40.0000 mg | Freq: Every day | INTRAVENOUS | Status: DC
  Administered 2011-12-03: 40 mg via INTRAVENOUS
  Filled 2011-12-03 (×3): qty 40

## 2011-12-03 MED ORDER — SODIUM CHLORIDE 0.9 % IV BOLUS (SEPSIS)
1000.0000 mL | Freq: Once | INTRAVENOUS | Status: AC
  Administered 2011-12-03: 1000 mL via INTRAVENOUS

## 2011-12-03 MED ORDER — PROPOFOL 10 MG/ML IV EMUL
5.0000 ug/kg/min | Freq: Once | INTRAVENOUS | Status: AC
  Administered 2011-12-03: 50 ug/kg/min via INTRAVENOUS
  Filled 2011-12-03: qty 20

## 2011-12-03 MED ORDER — FENTANYL BOLUS VIA INFUSION
50.0000 ug | Freq: Four times a day (QID) | INTRAVENOUS | Status: DC | PRN
Start: 2011-12-03 — End: 2011-12-03
  Filled 2011-12-03: qty 100

## 2011-12-03 MED ORDER — FENTANYL CITRATE 0.05 MG/ML IJ SOLN
INTRAMUSCULAR | Status: AC
  Filled 2011-12-03: qty 2

## 2011-12-03 MED ORDER — SODIUM CHLORIDE 0.9 % IV SOLN
50.0000 ug/h | INTRAVENOUS | Status: DC
  Administered 2011-12-03: 50 ug/h via INTRAVENOUS
  Filled 2011-12-03: qty 50

## 2011-12-03 MED ORDER — PROPOFOL 10 MG/ML IV EMUL
INTRAVENOUS | Status: AC
  Filled 2011-12-03: qty 20

## 2011-12-03 MED ORDER — PIPERACILLIN-TAZOBACTAM 3.375 G IVPB
3.3750 g | Freq: Three times a day (TID) | INTRAVENOUS | Status: DC
  Administered 2011-12-03 – 2011-12-05 (×7): 3.375 g via INTRAVENOUS
  Filled 2011-12-03 (×9): qty 50

## 2011-12-03 MED ORDER — BIOTENE DRY MOUTH MT LIQD
15.0000 mL | Freq: Four times a day (QID) | OROMUCOSAL | Status: DC
  Administered 2011-12-03 – 2011-12-10 (×22): 15 mL via OROMUCOSAL

## 2011-12-03 MED ORDER — SODIUM CHLORIDE 0.45 % IV SOLN
INTRAVENOUS | Status: DC
  Administered 2011-12-03 (×2): via INTRAVENOUS
  Administered 2011-12-04: 125 mL/h via INTRAVENOUS
  Administered 2011-12-04: 06:00:00 via INTRAVENOUS
  Administered 2011-12-04: 125 mL/h via INTRAVENOUS

## 2011-12-03 MED ORDER — LORAZEPAM 2 MG/ML IJ SOLN
INTRAMUSCULAR | Status: AC
  Filled 2011-12-03: qty 1

## 2011-12-03 MED ORDER — SODIUM CHLORIDE 0.9 % IV SOLN
Freq: Once | INTRAVENOUS | Status: AC
  Administered 2011-12-03: 2000 mL via INTRAVENOUS

## 2011-12-03 MED ORDER — SUCCINYLCHOLINE CHLORIDE 20 MG/ML IJ SOLN
INTRAMUSCULAR | Status: AC
  Filled 2011-12-03: qty 10

## 2011-12-03 MED ORDER — FENTANYL CITRATE 0.05 MG/ML IJ SOLN
50.0000 ug | Freq: Once | INTRAMUSCULAR | Status: AC
  Administered 2011-12-03: 50 ug via INTRAVENOUS

## 2011-12-03 MED ORDER — CHLORHEXIDINE GLUCONATE 0.12 % MT SOLN
15.0000 mL | Freq: Two times a day (BID) | OROMUCOSAL | Status: DC
  Administered 2011-12-03 – 2011-12-05 (×5): 15 mL via OROMUCOSAL
  Filled 2011-12-03 (×5): qty 15

## 2011-12-03 MED ORDER — PIPERACILLIN-TAZOBACTAM 3.375 G IVPB
3.3750 g | Freq: Once | INTRAVENOUS | Status: AC
  Administered 2011-12-03: 3.375 g via INTRAVENOUS
  Filled 2011-12-03: qty 50

## 2011-12-03 MED ORDER — PROPOFOL 10 MG/ML IV EMUL
5.0000 ug/kg/min | INTRAVENOUS | Status: DC
  Administered 2011-12-03 (×2): 30 ug/kg/min via INTRAVENOUS
  Administered 2011-12-03: 25 ug/kg/min via INTRAVENOUS
  Administered 2011-12-04 (×2): 35 ug/kg/min via INTRAVENOUS
  Administered 2011-12-04: 40 ug/kg/min via INTRAVENOUS
  Administered 2011-12-04: 35 ug/kg/min via INTRAVENOUS
  Administered 2011-12-05: 10 ug/kg/min via INTRAVENOUS
  Filled 2011-12-03 (×10): qty 100

## 2011-12-03 MED ORDER — PROPOFOL 10 MG/ML IV EMUL: 5.0000 ug/kg/min | Freq: Once | INTRAVENOUS | Status: DC

## 2011-12-03 MED ORDER — ROCURONIUM BROMIDE 50 MG/5ML IV SOLN
INTRAVENOUS | Status: AC
  Filled 2011-12-03: qty 2

## 2011-12-03 MED ORDER — SODIUM CHLORIDE 0.9 % IV SOLN: Freq: Once | INTRAVENOUS | Status: DC

## 2011-12-03 MED ORDER — HEPARIN SODIUM (PORCINE) 5000 UNIT/ML IJ SOLN
5000.0000 [IU] | Freq: Three times a day (TID) | INTRAMUSCULAR | Status: DC
  Administered 2011-12-03 – 2011-12-08 (×16): 5000 [IU] via SUBCUTANEOUS
  Filled 2011-12-03 (×18): qty 1

## 2011-12-03 MED ORDER — FENTANYL BOLUS VIA INFUSION
50.0000 ug | Freq: Four times a day (QID) | INTRAVENOUS | Status: DC | PRN
  Filled 2011-12-03: qty 100

## 2011-12-03 MED ORDER — METHYLPREDNISOLONE SODIUM SUCC 40 MG IJ SOLR
40.0000 mg | Freq: Two times a day (BID) | INTRAMUSCULAR | Status: DC
  Administered 2011-12-03 – 2011-12-09 (×13): 40 mg via INTRAVENOUS
  Filled 2011-12-03 (×17): qty 1

## 2011-12-03 MED ORDER — LIDOCAINE HCL (CARDIAC) 20 MG/ML IV SOLN
INTRAVENOUS | Status: AC
  Filled 2011-12-03: qty 5

## 2011-12-03 MED ORDER — SODIUM CHLORIDE 0.9 % IV SOLN
50.0000 ug/h | INTRAVENOUS | Status: DC
  Administered 2011-12-04: 100 ug/h via INTRAVENOUS
  Filled 2011-12-03 (×2): qty 50

## 2011-12-03 MED ORDER — SODIUM CHLORIDE 0.9 % IV SOLN: 250.0000 mL | INTRAVENOUS | Status: DC | PRN

## 2011-12-03 MED ORDER — NEPRO/CARBSTEADY PO LIQD
1000.0000 mL | ORAL | Status: DC
  Filled 2011-12-03: qty 1000

## 2011-12-03 MED ORDER — SODIUM CHLORIDE 0.9 % IV SOLN
INTRAVENOUS | Status: DC
  Administered 2011-12-03: 6.9 [IU]/h via INTRAVENOUS
  Filled 2011-12-03: qty 1

## 2011-12-03 MED ORDER — HEPARIN SODIUM (PORCINE) 5000 UNIT/ML IJ SOLN: 5000.0000 [IU] | Freq: Three times a day (TID) | INTRAMUSCULAR | Status: DC

## 2011-12-03 MED ORDER — MIDAZOLAM HCL 2 MG/2ML IJ SOLN
INTRAMUSCULAR | Status: AC
  Filled 2011-12-03: qty 2

## 2011-12-03 MED ORDER — FENTANYL CITRATE 0.05 MG/ML IJ SOLN: 50.0000 ug | INTRAMUSCULAR | Status: DC | PRN

## 2011-12-03 MED ORDER — PRO-STAT SUGAR FREE PO LIQD
30.0000 mL | Freq: Two times a day (BID) | ORAL | Status: DC
  Administered 2011-12-04 (×2): 30 mL
  Filled 2011-12-03 (×7): qty 30

## 2011-12-03 MED ORDER — MIDAZOLAM HCL 2 MG/2ML IJ SOLN: 2.0000 mg | INTRAMUSCULAR | Status: DC | PRN

## 2011-12-03 MED ORDER — INSULIN GLARGINE 100 UNIT/ML ~~LOC~~ SOLN
10.0000 [IU] | Freq: Every day | SUBCUTANEOUS | Status: DC
  Administered 2011-12-03: 10 [IU] via SUBCUTANEOUS

## 2011-12-03 MED ORDER — SUCCINYLCHOLINE CHLORIDE 20 MG/ML IJ SOLN
INTRAMUSCULAR | Status: AC
  Administered 2011-12-03: 100 mg
  Filled 2011-12-03: qty 10

## 2011-12-03 MED ORDER — SODIUM CHLORIDE 0.9 % IV SOLN
INTRAVENOUS | Status: DC
  Administered 2011-12-03: 04:00:00 via INTRAVENOUS

## 2011-12-03 MED ORDER — LORAZEPAM 2 MG/ML IJ SOLN
1.0000 mg | Freq: Once | INTRAMUSCULAR | Status: AC
Start: 2011-12-03 — End: 2011-12-03
  Administered 2011-12-03: 1 mg via INTRAVENOUS

## 2011-12-03 MED ORDER — VANCOMYCIN HCL IN DEXTROSE 1-5 GM/200ML-% IV SOLN
1000.0000 mg | Freq: Once | INTRAVENOUS | Status: AC
  Administered 2011-12-03: 1000 mg via INTRAVENOUS
  Filled 2011-12-03: qty 200

## 2011-12-03 NOTE — Progress Notes (Signed)
INITIAL ADULT NUTRITION ASSESSMENT Date: 12/03/2011   Time: 12:25 PM  Reason for Assessment: Consult- TF Initiation/Management  ASSESSMENT: Female 56 y.o.  Dx: hyperglycemia, ARF  Hx:  Past Medical History  Diagnosis Date  . Diabetes mellitus   . Hypertension     Related Meds:     . sodium chloride   Intravenous Once  . sodium chloride   Intravenous Once  . sodium chloride   Intravenous Once  . antiseptic oral rinse  15 mL Mouth Rinse QID  . chlorhexidine  15 mL Mouth Rinse BID  . etomidate      . fentaNYL      . fentaNYL  50 mcg Intravenous Once  . heparin subcutaneous  5,000 Units Subcutaneous Q8H  . insulin aspart      . insulin (NOVOLIN-R) infusion   Intravenous To Major  . lidocaine (cardiac) 100 mg/35ml      . LORazepam      . LORazepam      . LORazepam  1 mg Intravenous Once  . LORazepam  2 mg Intravenous Once  . methylPREDNISolone (SOLU-MEDROL) injection  40 mg Intravenous Q12H  . midazolam      . midazolam      . pantoprazole (PROTONIX) IV  40 mg Intravenous QHS  . piperacillin-tazobactam (ZOSYN)  IV  3.375 g Intravenous Once  . piperacillin-tazobactam (ZOSYN)  IV  3.375 g Intravenous Q8H  . propofol  5-70 mcg/kg/min Intravenous Once  . rocuronium      . sodium chloride  1,000 mL Intravenous Once  . succinylcholine      . vancomycin  1,000 mg Intravenous Once  . DISCONTD: heparin  5,000 Units Subcutaneous Q8H  . DISCONTD: insulin regular  0-10 Units Intravenous TID WC  . DISCONTD: midazolam  2-4 mg Intravenous Once  . DISCONTD: propofol  5-70 mcg/kg/min Intravenous Once    Ht: 5\' 2"  (157.5 cm)  Wt: 225 lb 15.5 oz (102.5 kg)  Ideal Wt: 50 kg % Ideal Wt: 205%  Usual Wt: 220 lb (per pt husband)  % Usual Wt: 102%  Body mass index is 41.33 kg/(m^2). Obesity Class III  Food/Nutrition Related Hx: 3-4 days PTA, pt had n/v with upper abdominal pain, per H&P. Per pt husband, pt with decreased appetite for >3 days PTA. Pt only able to consume small  amounts of juice and soup. Pt husband states pt did not check her blood sugar at home; pt was given insulin set last year but pt has never used it.  Labs:  CMP     Component Value Date/Time   NA 166* 12/03/2011 0542   K 3.7 12/03/2011 0542   CL 129* 12/03/2011 0542   CO2 26 12/03/2011 0542   GLUCOSE 168* 12/03/2011 0542   BUN 76* 12/03/2011 0542   CREATININE 2.84* 12/03/2011 0542   CALCIUM 8.7 12/03/2011 0542   PROT 6.9 12/03/2011 0542   ALBUMIN 2.8* 12/03/2011 0542   AST 42* 12/03/2011 0542   ALT 122* 12/03/2011 0542   ALKPHOS 145* 12/03/2011 0542   BILITOT 1.5* 12/03/2011 0542   GFRNONAA 18* 12/03/2011 0542   GFRAA 20* 12/03/2011 0542  Magnesium: 2.8 on 3/25 (high) Phosphorus: 3.1 on 3/25 (WNL) Noted LFT's and Na elevated.  CBG (last 3)   Basename 12/03/11 0702 12/03/11 0614 12/03/11 0515  GLUCAP 149* 187* 233*     Intake/Output Summary (Last 24 hours) at 12/03/11 1236 Last data filed at 12/03/11 1000  Gross per 24 hour  Intake 2647.01 ml  Output    485 ml  Net 2162.01 ml    Diet Order: NPO  Supplements/Tube Feeding: Nepro Carb Steady @ 30 ml/hr Tip of OG tube is seen to be 3 cm above the carina.  IVF:    sodium chloride Last Rate: 100 mL/hr (12/03/11 0800)  feeding supplement (NEPRO CARB STEADY)   fentaNYL infusion INTRAVENOUS Last Rate: 50 mcg/hr (12/03/11 1000)  insulin (NOVOLIN-R) infusion Last Rate: Stopped (12/03/11 0900)  propofol Last Rate: 30 mcg/kg/min (12/03/11 0800)  DISCONTD: sodium chloride Last Rate: 150 mL/hr at 12/02/11 2238  DISCONTD: sodium chloride Last Rate: Stopped (12/03/11 4401)  DISCONTD: dextrose 5 % and 0.45% NaCl   DISCONTD: fentaNYL infusion INTRAVENOUS Last Rate: 50 mcg/hr (12/03/11 0347)    Estimated Nutritional Needs:   Kcal: 1683 Protein:60-80 grams Fluid: 1.5 L  Propofol currently running at 12 ml/hr, providing 317 kcals.  No plans to extubate soon as MS will not support, per Dr. Tyson Alias note. 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   Noted pt with +2 and +1 pitting edema of extremities.  NUTRITION DIAGNOSIS: -Inadequate oral intake (NI-2.1).  Status: Ongoing  RELATED TO: inability to eat  AS EVIDENCE BY: NPO status  MONITORING/EVALUATION(Goals): Goal: EN to provide 60-70% of estimated calorie needs based on ASPEN guidelines for permissive underfeeding of the critically ill. Monitor: TF tolerance/adequacy, renal labs, weight trend, need for vent support, propofol rate  EDUCATION NEEDS: -No education needs identified at this time  INTERVENTION:  Discontinue Nepro Carb Steady  Initiate Osmolite 1.5 via OG tube continuously @ 20 ml/hr with 30 ml Prostat BID. TF and Propofol will provide 1181 kcals (70% estimated kcal needs), 75 grams protein (100% estimated protein needs), and 365 ml free water daily.   Recommend ordering adult multi vitamin as pt will not receive 100% RDI's from current TF rate.  Dietitian #:[phone]  DOCUMENTATION CODES Per approved criteria  -Morbid Obesity    Karenann Cai 12/03/2011, 12:25 PM  Hettie Holstein [phone]

## 2011-12-03 NOTE — Procedures (Signed)
Central Venous Catheter Insertion Procedure Note Dana Thornton [id number] 02-12-1956  Procedure: Insertion of Central Venous Catheter Indications: Assessment of intravascular volume, Drug and/or fluid administration and Frequent blood sampling  Procedure Details Consent: Unable to obtain consent because of altered level of consciousness. Time Out: Verified patient identification, verified procedure, site/side was marked, verified correct patient position, special equipment/implants available, medications/allergies/relevent history reviewed, required imaging and test results available.  Performed  Maximum sterile technique was used including antiseptics, cap, gloves, gown, hand hygiene, mask and sheet. Skin prep: Chlorhexidine; local anesthetic administered A antimicrobial bonded/coated triple lumen catheter was placed in the right subclavian vein using the Seldinger technique.  Evaluation Blood flow good Complications: No apparent complications Patient did tolerate procedure well. Chest X-ray ordered to verify placement.  CXR: Catheter needs to be pulled about 4 cm  Overton Mam, M.D. Pulmonary and Critical Care Medicine Call E-link with questions [phone] 12/03/2011, 6:10 AM

## 2011-12-03 NOTE — Progress Notes (Signed)
ANTIBIOTIC CONSULT NOTE - INITIAL  Pharmacy Consult for Zosyn Indication: rule out sepsis  No Known Allergies  Patient Measurements: Height: 5\' 2"  (157.5 cm) Weight: 220 lb (99.791 kg) IBW/kg (Calculated) : 50.1   Vital Signs: Temp: 97.3 F (36.3 C) (03/25 0145) Temp src: Other (Comment) (03/24 2235) BP: 93/70 mmHg (03/25 0225) Pulse Rate: 107  (03/25 0225) Intake/Output from previous day: 03/24 0701 - 03/25 0700 In: 1500 [I.V.:1500] Out: -  Intake/Output from this shift: Total I/O In: 1500 [I.V.:1500] Out: -   Labs:  Basename 12/03/11 0202 12/02/11 2211  WBC -- 25.4*  HGB 13.3 14.4  PLT -- 297  LABCREA -- --  CREATININE 3.20* 3.59*   Estimated Creatinine Clearance: 22 ml/min (by C-G formula based on Cr of 3.2).  Medical History: Past Medical History  Diagnosis Date  . Diabetes mellitus   . Hypertension     Medications:  Vicodin  Phenergan  Assessment: 56 yo female with hyperglycemia, VDRF for empiric antibiotics  Plan:  Zosyn 3.375 g IV q8h F/U rernal function  Clerance Umland, Gary Fleet 12/03/2011,2:37 AM

## 2011-12-03 NOTE — ED Notes (Signed)
100 mg Succy IV push and 80 mg propofol given IV push at this time. Anethesthia RN and MD,  RT, at bedside for assistance. Pulmonologist to intubate.

## 2011-12-03 NOTE — ED Notes (Signed)
CVC placed by CCM. Right chest. Dressing applied .

## 2011-12-03 NOTE — Progress Notes (Signed)
CRITICAL VALUE ALERT  Critical value received:  Na=161  Date of notification:  12/03/11  Time of notification:  2217  Critical value read back:yes  Nurse who received alert:  Dustin Flock  MD notified (1st page):  Dr Marin Shutter  Time of first page: 2245  MD notified (2nd page):  Time of second page:  Responding MD:  Dr Charlynn Court  Time MD responded:  2245 Value consistent with previous results

## 2011-12-03 NOTE — Progress Notes (Signed)
CRITICAL VALUE ALERT  Critical value received:  Na=166  Date of notification:  12/03/11 Time of notification:  0629  Critical value read back:yes  Nurse who received alert:  Dustin Flock  MD notified (1st page):  Dr Deterding  Time of first page:  0630  MD notified (2nd page):  Time of second page:  Responding MD:  E. Deterding  Time MD responded:  0630

## 2011-12-03 NOTE — H&P (Signed)
. Name: Dana Thornton MRN: [id number] DOB: [birth year]    LOS: 1  PCCM ADMISSION NOTE  History of Present Illness:  56 yr old DM / HTN, auto immune hepatitis.  3-4 days n/v, upper abdominal pain and yellowin gof eyes. 2 days to primary  Treated vicodin / phenergan for abdo pain  / n/v. Became unresponsive 8 hrs prior to admission, delayed. Presented to [facility]. Temp 91, gl;u 1050. Intubated for airway protection. Insulin drip started. Fluid given to ICU  Lines / Drains: ETT 3/25>>> Rt [place] 3/25>>>  Cultures: BC 3/25>>> Urine 3/25>>> mrsa pcr neg  Antibiotics: 3/25 zosyn>>> 3/25 vanc>>>  Tests / Events: 3/25- intubated, line placed 3/25 Ct abdo- Bilateral lower lobe airspace opacification raises concern for  pneumonia, given associated air bronchograms.  2. Mild scattered diverticulosis along the descending proximal  sigmoid colon, without definite evidence of diverticulitis.  3. Vaguely increased attenuation within the gallbladder raises  concern for sludge; no definite stones identified within the  gallbladder. No evidence for obstruction or cholecystitis. 3/25 ct head - neg   The patient is sedated, intubated and unable to provide history, which was obtained for available medical records.    Past Medical History  Diagnosis Date  . Diabetes mellitus   . 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 No pertinent past surgical history. Prior to Admission medications   Medication Sig Start Date End Date Taking? Authorizing Provider  HYDROcodone-acetaminophen (NORCO) 5-325 MG per tablet Take 1 tablet by mouth every 4 (four) hours as needed. For pain   Yes Historical Provider, MD  promethazine (PHENERGAN) 25 MG tablet Take 25 mg by mouth every 4 (four) hours as needed. For nausea from pain meds.   Yes Historical Provider, MD   Allergies No Known Allergies reaction to pred per husband unclear.  Family History No family history on file.  Social  History  does not have a smoking history on file. She does not have any smokeless tobacco history on file. She reports that she does not drink alcohol. Her drug history not on file.  Review Of Systems  11 points review of systems is negative with an exception of listed in HPI.  Vital Signs: Temp:  [91 F (32.8 C)-98.1 F (36.7 C)] 97.3 F (36.3 C) (03/25 0900) Pulse Rate:  [96-117] 103  (03/25 0900) Resp:  [14-30] 24  (03/25 0900) BP: (84-148)/(61-126) 119/66 mmHg (03/25 0900) SpO2:  [93 %-100 %] 100 % (03/25 0900) FiO2 (%):  [39.4 %-100 %] 40 % (03/25 0900) Weight:  [99.791 kg (220 lb)-102.5 kg (225 lb 15.5 oz)] 102.5 kg (225 lb 15.5 oz) (03/25 0330) I/O last 3 completed shifts: In: 2174.3 [I.V.:2174.3] Out: 375 [Urine:375]  Physical Examination: General:  Unresponsive, vent Neuro:  rass -4 HEENT:  No jvd, perrl 5 mm sluggish, no jaundice Neck:  ett Cardiovascular:  s1 s2 RRR Lungs:  CTA Abdomen:  Soft, bs obese, no r/g  Ventilator settings: Vent Mode:  [-] PRVC FiO2 (%):  [39.4 %-100 %] 40 % Set Rate:  [14 bmp-15 bmp] 14 bmp Vt Set:  [400 mL-500 mL] 400 mL PEEP:  [5 cmH20] 5 cmH20 Plateau Pressure:  [14 cmH20-21 cmH20] 14 cmH20  Labs and Imaging:  Reviewed.  Please refer to the Assessment and Plan section for relevant results. CBC    Component Value Date/Time   WBC 25.4* 12/02/2011 2211   RBC 4.96 12/02/2011 2211   HGB 13.3 12/03/2011 0202   HCT 39.0  12/03/2011 0202   PLT 297 12/02/2011 2211   MCV 90.3 12/02/2011 2211   MCH 29.0 12/02/2011 2211   MCHC 32.1 12/02/2011 2211   RDW 18.0* 12/02/2011 2211   LYMPHSABS 2.3 12/02/2011 2211   MONOABS 0.5 12/02/2011 2211   EOSABS 0.0 12/02/2011 2211   BASOSABS 0.0 12/02/2011 2211    BMET    Component Value Date/Time   NA 166* 12/03/2011 0542   K 3.7 12/03/2011 0542   CL 129* 12/03/2011 0542   CO2 26 12/03/2011 0542   GLUCOSE 168* 12/03/2011 0542   BUN 76* 12/03/2011 0542   CREATININE 2.84* 12/03/2011 0542   CALCIUM 8.7 12/03/2011  0542   GFRNONAA 18* 12/03/2011 0542   GFRAA 20* 12/03/2011 0542    ABG    Component Value Date/Time   PHART 7.342* 12/03/2011 0600   PCO2ART 45.4* 12/03/2011 0600   PO2ART 104.0* 12/03/2011 0600   HCO3 24.1* 12/03/2011 0600   TCO2 25.5 12/03/2011 0600   ACIDBASEDEF 0.9 12/03/2011 0600   O2SAT 97.9 12/03/2011 0600      Assessment and Plan: Acute resp failure, type 4 -abg reviewed -Continue current MV -treating asp process Consider SBT, no plan extubation, MS will not support  Acute renal failure -pre renal likely -continue 1/2 NS bmet q4h Slow na correction  Likely asp PNA CT c/w bases Continue asp coverage Follow BC, sputum addition  Change in  MS -CT head neg -Correct na slowly -consider lactulose ealry in am after volume corrected and re eval  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 And liver dz Consider early TF   Hypernatremia Na corrected on admission 168, now 166 , no major osm changes Correct slow with 1/2 NS  malnurished -tf ppi  At risk DVT Add sub q hep  aggitation Propofol Dc versed - liver dz fent  Best practices / Disposition: -->ICU status under PCCM -->full code -->Heparin for DVT Px -->Protonix for GI Px -->ventilator bundle -->diet TF  -->family updated at bedside  The patient is critically ill with multiple organ systems failure and requires high complexity decision making for assessment and support, frequent evaluation and titration of therapies, application of advanced monitoring technologies and extensive interpretation of multiple databases. Critical Care Time devoted to patient care services described in this note is 60 minutes.  Nelda Bucks 12/03/2011, 9:58 AM  Mcarthur Rossetti. Tyson Alias, MD, FACP Pgr: [phone] [place]  Pulmonary & Critical Care

## 2011-12-03 NOTE — ED Notes (Signed)
CVC line attempted by Ed Dr  Not successfully.

## 2011-12-03 NOTE — ED Notes (Signed)
PT tubed with 7.0. Good color change, b

## 2011-12-03 NOTE — ED Notes (Signed)
CBG >600 INSULIN GTT INCREASE TO 10.8UNIT/HR

## 2011-12-03 NOTE — Procedures (Signed)
Intubation Procedure Note Dana Thornton [id number] May 07, 1956  Procedure: Intubation Indications: Airway protection and maintenance  Procedure Details Consent: Unable to obtain consent because of altered level of consciousness. Time Out: Verified patient identification, verified procedure, site/side was marked, verified correct patient position, special equipment/implants available, medications/allergies/relevent history reviewed, required imaging and test results available.  Performed  Medications used: Propofol and succinylcholine Equipment used: Macintosh 3 laryngoscope blade Grade I View Correct placement confirmed by by auscultation and ETCO2 monitor Tube secured at 22 cm at the lip  Evaluation Hemodynamic Status: BP stable throughout; O2 sats: stable throughout Patient's Current Condition: stable Complications: No apparent complications Patient did tolerate procedure well. Chest X-ray ordered to verify placement.  CXR: tube position acceptable.   Overton Mam, M.D. Pulmonary and Critical Care Medicine Call E-link with questions [phone] 12/03/2011

## 2011-12-03 NOTE — ED Notes (Signed)
Intensivist and RN and RT at bedside for impending intubation.

## 2011-12-04 ENCOUNTER — Inpatient Hospital Stay (HOSPITAL_COMMUNITY): Payer: 59

## 2011-12-04 DIAGNOSIS — R7309 Other abnormal glucose: Secondary | ICD-10-CM

## 2011-12-04 DIAGNOSIS — J9600 Acute respiratory failure, unspecified whether with hypoxia or hypercapnia: Principal | ICD-10-CM

## 2011-12-04 DIAGNOSIS — K759 Inflammatory liver disease, unspecified: Secondary | ICD-10-CM

## 2011-12-04 DIAGNOSIS — E1310 Other specified diabetes mellitus with ketoacidosis without coma: Secondary | ICD-10-CM

## 2011-12-04 LAB — GLUCOSE, CAPILLARY
Glucose-Capillary: 380 mg/dL — ABNORMAL HIGH (ref 70–99)
Glucose-Capillary: 362 mg/dL — ABNORMAL HIGH (ref 70–99)
Glucose-Capillary: 303 mg/dL — ABNORMAL HIGH (ref 70–99)
Glucose-Capillary: 190 mg/dL — ABNORMAL HIGH (ref 70–99)
Glucose-Capillary: 132 mg/dL — ABNORMAL HIGH (ref 70–99)
Glucose-Capillary: 136 mg/dL — ABNORMAL HIGH (ref 70–99)
Glucose-Capillary: 104 mg/dL — ABNORMAL HIGH (ref 70–99)
Glucose-Capillary: 175 mg/dL — ABNORMAL HIGH (ref 70–99)
Glucose-Capillary: 224 mg/dL — ABNORMAL HIGH (ref 70–99)
Glucose-Capillary: 189 mg/dL — ABNORMAL HIGH (ref 70–99)
Glucose-Capillary: 191 mg/dL — ABNORMAL HIGH (ref 70–99)

## 2011-12-04 LAB — POCT I-STAT 3, ART BLOOD GAS (G3+)
pCO2 arterial: 47.7 mmHg — ABNORMAL HIGH (ref 35.0–45.0)
pO2, Arterial: 71 mmHg — ABNORMAL LOW (ref 80.0–100.0)

## 2011-12-04 LAB — COMPREHENSIVE METABOLIC PANEL
ALT: 93 U/L — ABNORMAL HIGH (ref 0–35)
BUN: 67 mg/dL — ABNORMAL HIGH (ref 6–23)
CO2: 24 mEq/L (ref 19–32)
Calcium: 8 mg/dL — ABNORMAL LOW (ref 8.4–10.5)
Creatinine, Ser: 1.91 mg/dL — ABNORMAL HIGH (ref 0.50–1.10)
GFR calc Af Amer: 33 mL/min — ABNORMAL LOW (ref >90)
GFR calc non Af Amer: 28 mL/min — ABNORMAL LOW (ref >90)
Glucose, Bld: 407 mg/dL — ABNORMAL HIGH (ref 70–99)

## 2011-12-04 LAB — URINE CULTURE
Colony Count: NO GROWTH
Culture  Setup Time: 201303250221
Culture: NO GROWTH

## 2011-12-04 LAB — BASIC METABOLIC PANEL
BUN: 67 mg/dL — ABNORMAL HIGH (ref 6–23)
CO2: 25 mEq/L (ref 19–32)
CO2: 27 mEq/L (ref 19–32)
Calcium: 8 mg/dL — ABNORMAL LOW (ref 8.4–10.5)
Chloride: 127 mEq/L — ABNORMAL HIGH (ref 96–112)
Creatinine, Ser: 2.01 mg/dL — ABNORMAL HIGH (ref 0.50–1.10)
Glucose, Bld: 378 mg/dL — ABNORMAL HIGH (ref 70–99)
Glucose, Bld: 172 mg/dL — ABNORMAL HIGH (ref 70–99)
Potassium: 4.3 mEq/L (ref 3.5–5.1)
Sodium: 162 mEq/L (ref 135–145)

## 2011-12-04 LAB — CBC
Hemoglobin: 11.7 g/dL — ABNORMAL LOW (ref 12.0–15.0)
MCHC: 32.3 g/dL (ref 30.0–36.0)
RBC: 4.03 MIL/uL (ref 3.87–5.11)

## 2011-12-04 LAB — DIFFERENTIAL
Basophils Relative: 0 % (ref 0–1)
Monocytes Relative: 1 % — ABNORMAL LOW (ref 3–12)
Neutro Abs: 20 10*3/uL — ABNORMAL HIGH (ref 1.7–7.7)
Neutrophils Relative %: 92 % — ABNORMAL HIGH (ref 43–77)

## 2011-12-04 LAB — PROCALCITONIN: Procalcitonin: 0.45 ng/mL

## 2011-12-04 MED ORDER — PANTOPRAZOLE SODIUM 40 MG PO PACK
40.0000 mg | PACK | Freq: Every day | ORAL | Status: DC
  Administered 2011-12-04: 40 mg
  Filled 2011-12-04 (×2): qty 20

## 2011-12-04 MED ORDER — SODIUM CHLORIDE 0.9 % IV SOLN
INTRAVENOUS | Status: DC
  Administered 2011-12-04: 4.2 [IU]/h via INTRAVENOUS
  Administered 2011-12-04: 10.4 [IU]/h via INTRAVENOUS
  Administered 2011-12-04: 5.1 [IU]/h via INTRAVENOUS
  Filled 2011-12-04 (×2): qty 1

## 2011-12-04 MED ORDER — FREE WATER
200.0000 mL | Freq: Three times a day (TID) | Status: DC
  Administered 2011-12-04 – 2011-12-05 (×4): 200 mL

## 2011-12-04 MED ORDER — LACTULOSE 10 GM/15ML PO SOLN
30.0000 g | Freq: Every day | ORAL | Status: DC
  Administered 2011-12-04: 30 g via ORAL
  Filled 2011-12-04 (×3): qty 45

## 2011-12-04 NOTE — H&P (Signed)
. Name: Dana Thornton MRN: [id number] DOB: 1956/03/28    LOS: 2  PCCM ADMISSION NOTE  History of Present Illness:  56 yr old DM / HTN, auto immune hepatitis.  3-4 days n/v, upper abdominal pain and yellowin gof eyes. 2 days to primary  Treated vicodin / phenergan for abdo pain  / n/v. Became unresponsive 8 hrs prior to admission, delayed. Presented to [facility]. Temp 91, gl;u 1050. Intubated for airway protection. Insulin drip started. Fluid given to ICU  Lines / Drains: ETT 3/25>>> Rt [place] 3/25>>>  Cultures: BC 3/25>>> Urine 3/25>>> mrsa pcr neg  Antibiotics: 3/25 zosyn>>> 3/25 vanc>>>off  Tests / Events: 3/25- intubated, line placed 3/25 Ct abdo- Bilateral lower lobe airspace opacification raises concern for  pneumonia, given associated air bronchograms.  2. Mild scattered diverticulosis along the descending proximal  sigmoid colon, without definite evidence of diverticulitis.  3. Vaguely increased attenuation within the gallbladder raises  concern for sludge; no definite stones identified within the  gallbladder. No evidence for obstruction or cholecystitis. 3/25 ct head - neg 3/26- hyperglycemia, higher prop needs  Vital Signs: Temp:  [97.3 F (36.3 C)-98.4 F (36.9 C)] 97.5 F (36.4 C) (03/26 0700) Pulse Rate:  [76-106] 82  (03/26 0700) Resp:  [14-28] 15  (03/26 0700) BP: (87-113)/(48-89) 93/58 mmHg (03/26 0700) SpO2:  [98 %-100 %] 99 % (03/26 0700) FiO2 (%):  [39.7 %-40.2 %] 40.1 % (03/26 0700) Weight:  [106.4 kg (234 lb 9.1 oz)] 106.4 kg (234 lb 9.1 oz) (03/26 0500) I/O last 3 completed shifts: In: 6608 [I.V.:6113; NG/GT:320; IV Piggyback:175] Out: 1445 [Urine:1445]  Physical Examination: General:  aggitated at times Neuro:  rass -2, moves all ext, not following commands HEENT:  No jvd, perrl 3 mm sluggish, no jaundice Neck:  ett Cardiovascular:  s1 s2 RRR Lungs:  CTA Abdomen:  Soft, bs obese, no r/g, liver border 2 cm?  Ventilator settings: Vent Mode:   [-] PRVC FiO2 (%):  [39.7 %-40.2 %] 40.1 % Set Rate:  [14 bmp] 14 bmp Vt Set:  [400 mL] 400 mL PEEP:  [5 cmH20] 5 cmH20 Plateau Pressure:  [17 cmH20-18 cmH20] 18 cmH20  Labs and Imaging:  Reviewed.  Please refer to the Assessment and Plan section for relevant results. CBC    Component Value Date/Time   WBC 21.7* 12/04/2011 0540   RBC 4.03 12/04/2011 0540   HGB 11.7* 12/04/2011 0540   HCT 36.2 12/04/2011 0540   PLT 164 12/04/2011 0540   MCV 89.8 12/04/2011 0540   MCH 29.0 12/04/2011 0540   MCHC 32.3 12/04/2011 0540   RDW 18.6* 12/04/2011 0540   LYMPHSABS 1.5 12/04/2011 0540   MONOABS 0.3 12/04/2011 0540   EOSABS 0.0 12/04/2011 0540   BASOSABS 0.0 12/04/2011 0540    BMET    Component Value Date/Time   NA 159* 12/04/2011 0540   K 4.4 12/04/2011 0540   CL 129* 12/04/2011 0540   CO2 24 12/04/2011 0540   GLUCOSE 407* 12/04/2011 0540   BUN 67* 12/04/2011 0540   CREATININE 1.91* 12/04/2011 0540   CALCIUM 8.0* 12/04/2011 0540   GFRNONAA 28* 12/04/2011 0540   GFRAA 33* 12/04/2011 0540    ABG    Component Value Date/Time   PHART 7.321* 12/04/2011 0411   PCO2ART 47.7* 12/04/2011 0411   PO2ART 71.0* 12/04/2011 0411   HCO3 24.8* 12/04/2011 0411   TCO2 26 12/04/2011 0411   ACIDBASEDEF 2.0 12/04/2011 0411   O2SAT 93.0 12/04/2011 0411  Assessment and Plan: Acute resp failure, type 4 -abg reviewed, increase Tv 450 -plan wean cpap 5 ps 5 with sedation and apnea alarms, Tv alarms unlikely to extubate with current neurostatus pcxrin am  follow plat   Acute renal failure, improved -pre renal likely -continue 1/2 NS bmet in pm  Slow na correction, add free water  Likely asp PNA CT c/w bases Will narrow zosyn in am if remains culture eneg  Change in  MS -CT head neg -Correct na slowly -consider lactulose as pos balance to continue  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) lft wnl Await Gi to see Maintain solumedral Await esr  HNNK / small degree dka -tolerating TF  1/2  NS Restart insulin drip, best way to control glu for her Obtain TG  Hypernatremia Na corrected on admission 168, now 166 , no major osm changes Correct slow with 1/2 NS Add free water  malnurished -tf ppi  At risk DVT sub q hep  aggitation Propofol with wua lactulsoe today Dc versed - liver dz fent 1 day history neurochanges does not support vasculitis cns etc Esr awaited  Best practices / Disposition: -->ICU status under PCCM -->full code -->Heparin for DVT Px -->Protonix for GI Px -->ventilator bundle -->diet TF  -->family updated at bedside  The patient is critically ill with multiple organ systems failure and requires high complexity decision making for assessment and support, frequent evaluation and titration of therapies, application of advanced monitoring technologies and extensive interpretation of multiple databases. Critical Care Time devoted to patient care services described in this note is 30 minutes.  Nelda Bucks 12/04/2011, 9:22 AM  Mcarthur Rossetti. Tyson Alias, MD, FACP Pgr: [phone] [place] [facility]

## 2011-12-04 NOTE — Significant Event (Signed)
CRITICAL VALUE ALERT  Critical value received:  Na++ 162  Date of notification:  12/04/2011  Time of notification:  1940  Critical value read back:yes  Nurse who received alert:  Gerre Pebbles, RN/Davis Mayo Ao, RN  MD notified (1st page):  Dr Blima Dessert  Time of first page:  1940  MD notified (2nd page):  Time of second page:  Responding MD:  Dr Amadeo Garnet  Time MD responded:  [phone]

## 2011-12-04 NOTE — Consult Note (Signed)
[facility] CONSULT Reason for consult: History of autoimmune hepatitis Referring Physician: Dr Tyson Alias, PCP: Dr. Margretta Ditty, GI: Dr. Dorena Cookey  Dana Thornton is an 56 y.o. female.  HPI: The patient has history of diabetes exacerbated in the past by the use of steroids. This she has documented autoimmune hepatitis and has been followed by Dr. Madilyn Fireman. She apparently has had this for a number of years this can has had previous workup by Dr. Madilyn Fireman. He had apparently treated her in the past with prednisone with rapid resolution of her liver test. The prednisone was tapered and she was started on 6-MP. She continued to be in remission on 6-MP and in 2011 the 6 MP was decreased to 25 mg daily. For unclear reasons the patient stop the medication. She will return to her primary care physician feeling poorly about 2 weeks ago with total bilirubin 9.2 and transaminases around 1400. This she saw Dr. Madilyn Fireman in the office and was jaundice. He started her back on prednisone and 6-mercaptopurine 50 mg daily. She was brought to the emergency room unresponsive. She had been nauseated and had been treated with Vicodin and her. She had a respiratory arrest and required intubation. CT of the abdomen showed possible pneumonia as well as diverticulosis and a question of sludge in the gallbladder. Patient admitted with blood sugar 1055 on insulin drip and treated with antibiotics for probable aspiration pneumonia. She has been given steroids for her pneumonia. Her initial bilirubin was 2.3 on admission with slight elevation of transaminases has slightly improved since receiving steroids.   Past Medical History  Diagnosis Date  . Diabetes mellitus   . Hypertension     History reviewed. No pertinent past surgical history.  History reviewed. No pertinent family history.  Social History:  reports that she has never smoked. She has never used smokeless tobacco. She reports that she does not drink alcohol or  use illicit drugs.  Allergies: No Known Allergies  Medications;    . sodium chloride   Intravenous Once  . antiseptic oral rinse  15 mL Mouth Rinse QID  . chlorhexidine  15 mL Mouth Rinse BID  . etomidate      . feeding supplement (OSMOLITE 1.5 CAL)  1,000 mL Per Tube Q24H  . feeding supplement  30 mL Per Tube BID  . free water  200 mL Per Tube Q8H  . heparin subcutaneous  5,000 Units Subcutaneous Q8H  . lactulose  30 g Oral Daily  . lidocaine (cardiac) 100 mg/67ml      . methylPREDNISolone (SOLU-MEDROL) injection  40 mg Intravenous Q12H  . midazolam      . pantoprazole (PROTONIX) IV  40 mg Intravenous QHS  . piperacillin-tazobactam (ZOSYN)  IV  3.375 g Intravenous Q8H  . rocuronium      . sodium chloride  1,000 mL Intravenous Once  . DISCONTD: insulin aspart  0-15 Units Subcutaneous Q4H  . DISCONTD: insulin glargine  10 Units Subcutaneous QHS   PRN Meds sodium chloride, fentaNYL Results for orders placed during the hospital encounter of 12/02/11 (from the past 48 hour(s))  URINALYSIS, ROUTINE W REFLEX MICROSCOPIC     Status: Abnormal   Collection Time   12/02/11  9:04 PM      Component Value Range Comment   Color, Urine YELLOW  YELLOW     APPearance CLOUDY (*) CLEAR     Specific Gravity, Urine 1.026  1.005 - 1.030     pH 5.5  5.0 - 8.0  Glucose, UA >1000 (*) NEGATIVE (mg/dL)    Hgb urine dipstick LARGE (*) NEGATIVE     Bilirubin Urine SMALL (*) NEGATIVE     Ketones, ur NEGATIVE  NEGATIVE (mg/dL)    Protein, ur 161 (*) NEGATIVE (mg/dL)    Urobilinogen, UA 0.2  0.0 - 1.0 (mg/dL)    Nitrite NEGATIVE  NEGATIVE     Leukocytes, UA NEGATIVE  NEGATIVE    URINE MICROSCOPIC-ADD ON     Status: Abnormal   Collection Time   12/02/11  9:04 PM      Component Value Range Comment   Squamous Epithelial / LPF FEW (*) RARE     RBC / HPF 3-6  <3 (RBC/hpf)    Bacteria, UA MANY (*) RARE     Casts GRANULAR CAST (*) NEGATIVE  HYALINE CASTS   Urine-Other AMORPHOUS MATERIAL PRESENT       GLUCOSE, CAPILLARY     Status: Abnormal   Collection Time   12/02/11 10:10 PM      Component Value Range Comment   Glucose-Capillary >600 (*) 70 - 99 (mg/dL)    Comment 1 Documented in Chart      Comment 2 Notify RN     CBC     Status: Abnormal   Collection Time   12/02/11 10:11 PM      Component Value Range Comment   WBC 25.4 (*) 4.0 - 10.5 (K/uL)    RBC 4.96  3.87 - 5.11 (MIL/uL)    Hemoglobin 14.4  12.0 - 15.0 (g/dL)    HCT 09.6  04.5 - 40.9 (%)    MCV 90.3  78.0 - 100.0 (fL)    MCH 29.0  26.0 - 34.0 (pg)    MCHC 32.1  30.0 - 36.0 (g/dL)    RDW 81.1 (*) 91.4 - 15.5 (%)    Platelets 297  150 - 400 (K/uL)   DIFFERENTIAL     Status: Abnormal   Collection Time   12/02/11 10:11 PM      Component Value Range Comment   Neutrophils Relative 89 (*) 43 - 77 (%)    Lymphocytes Relative 9 (*) 12 - 46 (%)    Monocytes Relative 2 (*) 3 - 12 (%)    Eosinophils Relative 0  0 - 5 (%)    Basophils Relative 0  0 - 1 (%)    Neutro Abs 22.6 (*) 1.7 - 7.7 (K/uL)    Lymphs Abs 2.3  0.7 - 4.0 (K/uL)    Monocytes Absolute 0.5  0.1 - 1.0 (K/uL)    Eosinophils Absolute 0.0  0.0 - 0.7 (K/uL)    Basophils Absolute 0.0  0.0 - 0.1 (K/uL)    WBC Morphology MILD LEFT SHIFT (1-5% METAS, OCC MYELO, OCC BANDS)     COMPREHENSIVE METABOLIC PANEL     Status: Abnormal   Collection Time   12/02/11 10:11 PM      Component Value Range Comment   Sodium 151 (*) 135 - 145 (mEq/L)    Potassium 5.6 (*) 3.5 - 5.1 (mEq/L) HEMOLYSIS AT THIS LEVEL MAY AFFECT RESULT   Chloride 109  96 - 112 (mEq/L)    CO2 21  19 - 32 (mEq/L)    Glucose, Bld 1055 (*) 70 - 99 (mg/dL)    BUN 84 (*) 6 - 23 (mg/dL)    Creatinine, Ser 7.82 (*) 0.50 - 1.10 (mg/dL)    Calcium 9.5  8.4 - 10.5 (mg/dL)    Total Protein 7.8  6.0 -  8.3 (g/dL)    Albumin 3.4 (*) 3.5 - 5.2 (g/dL)    AST 47 (*) 0 - 37 (U/L) HEMOLYSIS AT THIS LEVEL MAY AFFECT RESULT   ALT 143 (*) 0 - 35 (U/L)    Alkaline Phosphatase 172 (*) 39 - 117 (U/L)    Total Bilirubin 2.3 (*)  0.3 - 1.2 (mg/dL)    GFR calc non Af Amer 13 (*) >90 (mL/min)    GFR calc Af Amer 15 (*) >90 (mL/min)   LACTIC ACID, PLASMA     Status: Abnormal   Collection Time   12/02/11 10:12 PM      Component Value Range Comment   Lactic Acid, Venous 6.0 (*) 0.5 - 2.2 (mmol/L)   URINE CULTURE     Status: Normal   Collection Time   12/02/11 10:22 PM      Component Value Range Comment   Specimen Description URINE, CATHETERIZED      Special Requests NONE      Culture  Setup Time [id number]      Colony Count NO GROWTH      Culture NO GROWTH      Report Status 12/04/2011 FINAL     POCT I-STAT 3, BLOOD GAS (G3+)     Status: Abnormal   Collection Time   12/02/11 10:24 PM      Component Value Range Comment   pH, Arterial 7.345 (*) 7.350 - 7.400     pCO2 arterial 45.5 (*) 35.0 - 45.0 (mmHg)    pO2, Arterial 369.0 (*) 80.0 - 100.0 (mmHg)    Bicarbonate 25.5 (*) 20.0 - 24.0 (mEq/L)    TCO2 27  0 - 100 (mmol/L)    O2 Saturation 100.0      Acid-base deficit 1.0  0.0 - 2.0 (mmol/L)    Patient temperature 34.7 C      Collection site BRACHIAL ARTERY      Drawn by RT      Sample type ARTERIAL     CARDIAC PANEL(CRET KIN+CKTOT+MB+TROPI)     Status: Abnormal   Collection Time   12/02/11 10:29 PM      Component Value Range Comment   Total CK 795 (*) 7 - 177 (U/L)    CK, MB 2.7  0.3 - 4.0 (ng/mL)    Troponin I <0.30  <0.30 (ng/mL)    Relative Index 0.3  0.0 - 2.5    PREGNANCY, URINE     Status: Normal   Collection Time   12/02/11 10:36 PM      Component Value Range Comment   Preg Test, Ur NEGATIVE  NEGATIVE    CULTURE, BLOOD (ROUTINE X 2)     Status: Normal (Preliminary result)   Collection Time   12/02/11 10:45 PM      Component Value Range Comment   Specimen Description BLOOD RIGHT HAND      Special Requests BOTTLES DRAWN AEROBIC ONLY 2CC      Culture  Setup Time [id number]      Culture        Value:        BLOOD CULTURE RECEIVED NO GROWTH TO DATE CULTURE WILL BE HELD FOR 5 DAYS BEFORE ISSUING A  FINAL NEGATIVE REPORT   Report Status PENDING     GLUCOSE, CAPILLARY     Status: Abnormal   Collection Time   12/02/11 11:34 PM      Component Value Range Comment   Glucose-Capillary >600 (*) 70 - 99 (mg/dL)   KETONES, QUALITATIVE  Status: Normal   Collection Time   12/03/11 12:15 AM      Component Value Range Comment   Acetone, Bld NEGATIVE  NEGATIVE    LIPASE, BLOOD     Status: Abnormal   Collection Time   12/03/11 12:17 AM      Component Value Range Comment   Lipase 103 (*) 11 - 59 (U/L)   GLUCOSE, CAPILLARY     Status: Abnormal   Collection Time   12/03/11 12:40 AM      Component Value Range Comment   Glucose-Capillary >600 (*) 70 - 99 (mg/dL)   POCT I-STAT, CHEM 8     Status: Abnormal   Collection Time   12/03/11  2:02 AM      Component Value Range Comment   Sodium 166 (*) 135 - 145 (mEq/L)    Potassium 3.6  3.5 - 5.1 (mEq/L)    Chloride 129 (*) 96 - 112 (mEq/L)    BUN 79 (*) 6 - 23 (mg/dL)    Creatinine, Ser 0.86 (*) 0.50 - 1.10 (mg/dL)    Glucose, Bld 578 (*) 70 - 99 (mg/dL)    Calcium, Ion 4.69  1.12 - 1.32 (mmol/L)    TCO2 21  0 - 100 (mmol/L)    Hemoglobin 13.3  12.0 - 15.0 (g/dL)    HCT 62.9  52.8 - 41.3 (%)    Comment NOTIFIED PHYSICIAN     GLUCOSE, CAPILLARY     Status: Abnormal   Collection Time   12/03/11  2:10 AM      Component Value Range Comment   Glucose-Capillary 452 (*) 70 - 99 (mg/dL)   MRSA PCR SCREENING     Status: Normal   Collection Time   12/03/11  2:56 AM      Component Value Range Comment   MRSA by PCR NEGATIVE  NEGATIVE    GLUCOSE, CAPILLARY     Status: Abnormal   Collection Time   12/03/11  3:10 AM      Component Value Range Comment   Glucose-Capillary 406 (*) 70 - 99 (mg/dL)    Comment 1 Notify RN     GLUCOSE, CAPILLARY     Status: Abnormal   Collection Time   12/03/11  4:22 AM      Component Value Range Comment   Glucose-Capillary 310 (*) 70 - 99 (mg/dL)   GLUCOSE, CAPILLARY     Status: Abnormal   Collection Time   12/03/11  5:15  AM      Component Value Range Comment   Glucose-Capillary 233 (*) 70 - 99 (mg/dL)   APTT     Status: Abnormal   Collection Time   12/03/11  5:42 AM      Component Value Range Comment   aPTT 21 (*) 24 - 37 (seconds) SPECIMEN CHECKED FOR CLOTS  COMPREHENSIVE METABOLIC PANEL     Status: Abnormal   Collection Time   12/03/11  5:42 AM      Component Value Range Comment   Sodium 166 (*) 135 - 145 (mEq/L)    Potassium 3.7  3.5 - 5.1 (mEq/L)    Chloride 129 (*) 96 - 112 (mEq/L)    CO2 26  19 - 32 (mEq/L)    Glucose, Bld 168 (*) 70 - 99 (mg/dL)    BUN 76 (*) 6 - 23 (mg/dL)    Creatinine, Ser 2.44 (*) 0.50 - 1.10 (mg/dL)    Calcium 8.7  8.4 - 10.5 (mg/dL)    Total Protein  6.9  6.0 - 8.3 (g/dL)    Albumin 2.8 (*) 3.5 - 5.2 (g/dL)    AST 42 (*) 0 - 37 (U/L)    ALT 122 (*) 0 - 35 (U/L)    Alkaline Phosphatase 145 (*) 39 - 117 (U/L)    Total Bilirubin 1.5 (*) 0.3 - 1.2 (mg/dL)    GFR calc non Af Amer 18 (*) >90 (mL/min)    GFR calc Af Amer 20 (*) >90 (mL/min)   MAGNESIUM     Status: Abnormal   Collection Time   12/03/11  5:42 AM      Component Value Range Comment   Magnesium 2.8 (*) 1.5 - 2.5 (mg/dL)   PHOSPHORUS     Status: Normal   Collection Time   12/03/11  5:42 AM      Component Value Range Comment   Phosphorus 3.1  2.3 - 4.6 (mg/dL)   PROTIME-INR     Status: Abnormal   Collection Time   12/03/11  5:42 AM      Component Value Range Comment   Prothrombin Time 16.1 (*) 11.6 - 15.2 (seconds)    INR 1.26  0.00 - 1.49    BLOOD GAS, ARTERIAL     Status: Abnormal   Collection Time   12/03/11  6:00 AM      Component Value Range Comment   FIO2 0.40      Delivery systems VENTILATOR      Mode PRESSURE REGULATED VOLUME CONTROL      VT 400      Rate 14      Peep/cpap 5.0      pH, Arterial 7.342 (*) 7.350 - 7.400     pCO2 arterial 45.4 (*) 35.0 - 45.0 (mmHg)    pO2, Arterial 104.0 (*) 80.0 - 100.0 (mmHg)    Bicarbonate 24.1 (*) 20.0 - 24.0 (mEq/L)    TCO2 25.5  0 - 100 (mmol/L)     Acid-base deficit 0.9  0.0 - 2.0 (mmol/L)    O2 Saturation 97.9      Patient temperature 97.9      Collection site RIGHT RADIAL      Drawn by [phone]      Sample type ARTERIAL DRAW      Allens test (pass/fail) PASS  PASS    GLUCOSE, CAPILLARY     Status: Abnormal   Collection Time   12/03/11  6:14 AM      Component Value Range Comment   Glucose-Capillary 187 (*) 70 - 99 (mg/dL)   GLUCOSE, CAPILLARY     Status: Abnormal   Collection Time   12/03/11  7:02 AM      Component Value Range Comment   Glucose-Capillary 149 (*) 70 - 99 (mg/dL)   GLUCOSE, CAPILLARY     Status: Abnormal   Collection Time   12/03/11  8:19 AM      Component Value Range Comment   Glucose-Capillary 116 (*) 70 - 99 (mg/dL)   GLUCOSE, CAPILLARY     Status: Normal   Collection Time   12/03/11  9:27 AM      Component Value Range Comment   Glucose-Capillary 74  70 - 99 (mg/dL)   BASIC METABOLIC PANEL     Status: Abnormal   Collection Time   12/03/11 10:14 AM      Component Value Range Comment   Sodium 164 (*) 135 - 145 (mEq/L)    Potassium 4.3  3.5 - 5.1 (mEq/L)    Chloride 130 (*)  96 - 112 (mEq/L)    CO2 24  19 - 32 (mEq/L)    Glucose, Bld 213 (*) 70 - 99 (mg/dL)    BUN 74 (*) 6 - 23 (mg/dL)    Creatinine, Ser 1.61 (*) 0.50 - 1.10 (mg/dL)    Calcium 8.5  8.4 - 10.5 (mg/dL)    GFR calc non Af Amer 20 (*) >90 (mL/min)    GFR calc Af Amer 24 (*) >90 (mL/min)   AMYLASE     Status: Normal   Collection Time   12/03/11 10:14 AM      Component Value Range Comment   Amylase 86  0 - 105 (U/L)   SEDIMENTATION RATE     Status: Abnormal   Collection Time   12/03/11 10:14 AM      Component Value Range Comment   Sed Rate 23 (*) 0 - 22 (mm/hr)   GLUCOSE, CAPILLARY     Status: Abnormal   Collection Time   12/03/11 10:27 AM      Component Value Range Comment   Glucose-Capillary 108 (*) 70 - 99 (mg/dL)   LACTIC ACID, PLASMA     Status: Normal   Collection Time   12/03/11 10:30 AM      Component Value Range Comment   Lactic  Acid, Venous 1.2  0.5 - 2.2 (mmol/L)   GLUCOSE, CAPILLARY     Status: Abnormal   Collection Time   12/03/11 11:18 AM      Component Value Range Comment   Glucose-Capillary 141 (*) 70 - 99 (mg/dL)   CULTURE, RESPIRATORY     Status: Normal (Preliminary result)   Collection Time   12/03/11 12:17 PM      Component Value Range Comment   Specimen Description ENDOTRACHEAL ASPIRATE      Special Requests NONE      Gram Stain        Value: NO WBC SEEN     FEW SQUAMOUS EPITHELIAL CELLS PRESENT     RARE GRAM POSITIVE COCCI IN PAIRS   Culture Non-Pathogenic Oropharyngeal-type Flora Isolated.      Report Status PENDING     GLUCOSE, CAPILLARY     Status: Abnormal   Collection Time   12/03/11 12:42 PM      Component Value Range Comment   Glucose-Capillary 174 (*) 70 - 99 (mg/dL)   GLUCOSE, CAPILLARY     Status: Abnormal   Collection Time   12/03/11  1:51 PM      Component Value Range Comment   Glucose-Capillary 184 (*) 70 - 99 (mg/dL)   GLUCOSE, CAPILLARY     Status: Abnormal   Collection Time   12/03/11  3:28 PM      Component Value Range Comment   Glucose-Capillary 187 (*) 70 - 99 (mg/dL)   GLUCOSE, CAPILLARY     Status: Abnormal   Collection Time   12/03/11  5:16 PM      Component Value Range Comment   Glucose-Capillary 220 (*) 70 - 99 (mg/dL)   BASIC METABOLIC PANEL     Status: Abnormal   Collection Time   12/03/11  6:14 PM      Component Value Range Comment   Sodium 162 (*) 135 - 145 (mEq/L)    Potassium 4.5  3.5 - 5.1 (mEq/L)    Chloride 127 (*) 96 - 112 (mEq/L)    CO2 25  19 - 32 (mEq/L)    Glucose, Bld 253 (*) 70 - 99 (mg/dL)  BUN 76 (*) 6 - 23 (mg/dL)    Creatinine, Ser 1.61 (*) 0.50 - 1.10 (mg/dL)    Calcium 8.4  8.4 - 10.5 (mg/dL)    GFR calc non Af Amer 21 (*) >90 (mL/min)    GFR calc Af Amer 25 (*) >90 (mL/min)   GLUCOSE, CAPILLARY     Status: Abnormal   Collection Time   12/03/11  6:15 PM      Component Value Range Comment   Glucose-Capillary 231 (*) 70 - 99 (mg/dL)     GLUCOSE, CAPILLARY     Status: Abnormal   Collection Time   12/03/11  7:02 PM      Component Value Range Comment   Glucose-Capillary 245 (*) 70 - 99 (mg/dL)   BASIC METABOLIC PANEL     Status: Abnormal   Collection Time   12/03/11  9:23 PM      Component Value Range Comment   Sodium 161 (*) 135 - 145 (mEq/L)    Potassium 4.1  3.5 - 5.1 (mEq/L)    Chloride 128 (*) 96 - 112 (mEq/L)    CO2 25  19 - 32 (mEq/L)    Glucose, Bld 337 (*) 70 - 99 (mg/dL)    BUN 70 (*) 6 - 23 (mg/dL)    Creatinine, Ser 0.96 (*) 0.50 - 1.10 (mg/dL)    Calcium 7.6 (*) 8.4 - 10.5 (mg/dL)    GFR calc non Af Amer 24 (*) >90 (mL/min)    GFR calc Af Amer 28 (*) >90 (mL/min)   GLUCOSE, CAPILLARY     Status: Abnormal   Collection Time   12/03/11 10:59 PM      Component Value Range Comment   Glucose-Capillary 339 (*) 70 - 99 (mg/dL)   BASIC METABOLIC PANEL     Status: Abnormal   Collection Time   12/04/11  1:37 AM      Component Value Range Comment   Sodium 158 (*) 135 - 145 (mEq/L)    Potassium 4.2  3.5 - 5.1 (mEq/L)    Chloride 127 (*) 96 - 112 (mEq/L)    CO2 25  19 - 32 (mEq/L)    Glucose, Bld 378 (*) 70 - 99 (mg/dL)    BUN 67 (*) 6 - 23 (mg/dL)    Creatinine, Ser 0.45 (*) 0.50 - 1.10 (mg/dL)    Calcium 7.8 (*) 8.4 - 10.5 (mg/dL)    GFR calc non Af Amer 27 (*) >90 (mL/min)    GFR calc Af Amer 31 (*) >90 (mL/min)   GLUCOSE, CAPILLARY     Status: Abnormal   Collection Time   12/04/11  3:36 AM      Component Value Range Comment   Glucose-Capillary 380 (*) 70 - 99 (mg/dL)   POCT I-STAT 3, BLOOD GAS (G3+)     Status: Abnormal   Collection Time   12/04/11  4:11 AM      Component Value Range Comment   pH, Arterial 7.321 (*) 7.350 - 7.400     pCO2 arterial 47.7 (*) 35.0 - 45.0 (mmHg)    pO2, Arterial 71.0 (*) 80.0 - 100.0 (mmHg)    Bicarbonate 24.8 (*) 20.0 - 24.0 (mEq/L)    TCO2 26  0 - 100 (mmol/L)    O2 Saturation 93.0      Acid-base deficit 2.0  0.0 - 2.0 (mmol/L)    Patient temperature 97.5 F       Collection site RADIAL, ALLEN'S TEST ACCEPTABLE  Drawn by Operator      Sample type ARTERIAL     COMPREHENSIVE METABOLIC PANEL     Status: Abnormal   Collection Time   12/04/11  5:40 AM      Component Value Range Comment   Sodium 159 (*) 135 - 145 (mEq/L)    Potassium 4.4  3.5 - 5.1 (mEq/L)    Chloride 129 (*) 96 - 112 (mEq/L)    CO2 24  19 - 32 (mEq/L)    Glucose, Bld 407 (*) 70 - 99 (mg/dL)    BUN 67 (*) 6 - 23 (mg/dL)    Creatinine, Ser 1.19 (*) 0.50 - 1.10 (mg/dL)    Calcium 8.0 (*) 8.4 - 10.5 (mg/dL)    Total Protein 6.4  6.0 - 8.3 (g/dL)    Albumin 2.6 (*) 3.5 - 5.2 (g/dL)    AST 52 (*) 0 - 37 (U/L)    ALT 93 (*) 0 - 35 (U/L)    Alkaline Phosphatase 133 (*) 39 - 117 (U/L)    Total Bilirubin 1.2  0.3 - 1.2 (mg/dL)    GFR calc non Af Amer 28 (*) >90 (mL/min)    GFR calc Af Amer 33 (*) >90 (mL/min)   CBC     Status: Abnormal   Collection Time   12/04/11  5:40 AM      Component Value Range Comment   WBC 21.7 (*) 4.0 - 10.5 (K/uL)    RBC 4.03  3.87 - 5.11 (MIL/uL)    Hemoglobin 11.7 (*) 12.0 - 15.0 (g/dL)    HCT 14.7  82.9 - 56.2 (%)    MCV 89.8  78.0 - 100.0 (fL)    MCH 29.0  26.0 - 34.0 (pg)    MCHC 32.3  30.0 - 36.0 (g/dL)    RDW 13.0 (*) 86.5 - 15.5 (%)    Platelets 164  150 - 400 (K/uL)   DIFFERENTIAL     Status: Abnormal   Collection Time   12/04/11  5:40 AM      Component Value Range Comment   Neutrophils Relative 92 (*) 43 - 77 (%)    Neutro Abs 20.0 (*) 1.7 - 7.7 (K/uL)    Lymphocytes Relative 7 (*) 12 - 46 (%)    Lymphs Abs 1.5  0.7 - 4.0 (K/uL)    Monocytes Relative 1 (*) 3 - 12 (%)    Monocytes Absolute 0.3  0.1 - 1.0 (K/uL)    Eosinophils Relative 0  0 - 5 (%)    Eosinophils Absolute 0.0  0.0 - 0.7 (K/uL)    Basophils Relative 0  0 - 1 (%)    Basophils Absolute 0.0  0.0 - 0.1 (K/uL)   PROCALCITONIN     Status: Normal   Collection Time   12/04/11  5:40 AM      Component Value Range Comment   Procalcitonin 0.45     GLUCOSE, CAPILLARY     Status:  Abnormal   Collection Time   12/04/11  7:58 AM      Component Value Range Comment   Glucose-Capillary 362 (*) 70 - 99 (mg/dL)    Comment 1 Notify RN      Comment 2 Documented in Chart       Ct Abdomen Pelvis Wo Contrast  12/03/2011  *RADIOLOGY REPORT*  Clinical Data: Abdominal pain; elevated LFTs.  Nausea, vomiting and jaundice.  CT ABDOMEN AND PELVIS WITHOUT CONTRAST  Technique:  Multidetector CT imaging of the abdomen and pelvis  was performed following the standard protocol without intravenous contrast.  Comparison: CT of the abdomen and pelvis performed 07/01/2007, and abdominal ultrasound performed 09/27/2008  Findings: Bilateral lower lobe airspace consolidation raises concern for pneumonia, given associated air bronchograms.  The liver and spleen are unremarkable in appearance.  Vaguely increased attenuation within the gallbladder may reflect sludge; no definite stones are identified.  There is no evidence for obstruction or cholecystitis.  The pancreas and adrenal glands are unremarkable.  The kidneys are unremarkable in appearance.  There is no evidence of hydronephrosis.  No renal or ureteral stones are seen.  No perinephric stranding is appreciated.  No free fluid is identified.  The small bowel is unremarkable in appearance.  The stomach is within normal limits.  No acute vascular abnormalities are seen.  Scattered calcification is noted along the abdominal aorta and its branches.  The appendix is normal in caliber, extending about the midline, without evidence for appendicitis.  Mild scattered diverticulosis is noted along the descending and proximal sigmoid colon, without definite evidence of diverticulitis.  The colon is otherwise unremarkable in appearance.  The bladder is decompressed, with a Foley catheter in place.  A small amount of air is noted within the bladder.  The uterus is grossly unremarkable in appearance.  The ovaries are relatively symmetric; no suspicious adnexal masses are  seen.  No inguinal lymphadenopathy is seen.  No acute osseous abnormalities are identified.  IMPRESSION:  1.  Bilateral lower lobe airspace opacification raises concern for pneumonia, given associated air bronchograms. 2.  Mild scattered diverticulosis along the descending proximal sigmoid colon, without definite evidence of diverticulitis. 3.  Vaguely increased attenuation within the gallbladder raises concern for sludge; no definite stones identified within the gallbladder.  No evidence for obstruction or cholecystitis.  Original Report Authenticated By: Tonia Ghent, M.D.   Ct Head Wo Contrast  12/02/2011  *RADIOLOGY REPORT*  Clinical Data: 56 year old female with altered mental status.  CT HEAD WITHOUT CONTRAST  Technique:  Contiguous axial images were obtained from the base of the skull through the vertex without contrast.  Comparison: None  Findings: No acute intracranial abnormalities are identified, including mass lesion or mass effect, hydrocephalus, extra-axial fluid collection, midline shift, hemorrhage, or acute infarction.  The visualized bony calvarium is unremarkable.  IMPRESSION: No evidence of acute intracranial abnormality.  Original Report Authenticated By: Rosendo Gros, M.D.   Dg Chest Port 1 View  12/04/2011  *RADIOLOGY REPORT*  Clinical Data: Respiratory failure  PORTABLE CHEST - 1 VIEW  Comparison: 12/03/2011  Findings: Endotracheal tube remains present with the tip approximately 4 cm above the carina.  Interval nasogastric tube with tip in stomach.  Central line positioning stable with tip in the right atrium.  Bilateral lower lobe atelectasis present which is slightly more prominent compared to the prior chest x-ray.  No overt pulmonary edema or significant pleural effusions identified.  IMPRESSION: Increased bilateral lower lobe atelectasis.  Original Report Authenticated By: Reola Calkins, M.D.   Dg Chest Port 1 View  12/03/2011  *RADIOLOGY REPORT*  Clinical Data:  Endotracheal tube placement.  PORTABLE CHEST - 1 VIEW  Comparison: Chest radiograph performed earlier today at 01:23 a.m.  Findings: The patient's endotracheal tube is seen ending 3 cm above the carina.  The patient's right IJ line is noted ending deep within the right atrium; this should be retracted 7 cm.  The lungs remain hypoexpanded.  Vascular congestion is noted, with mild bibasilar airspace opacity, raising concern for mild interstitial  edema.  No pleural effusion or pneumothorax is seen.  The cardiomediastinal silhouette is borderline enlarged.  No acute osseous abnormalities identified.  IMPRESSION:  1.  Endotracheal tube seen ending 3 cm above the carina. 2.  Right IJ line noted ending deep within the right atrium; this should be retracted 7 cm. 3.  Lungs hypoexpanded; vascular congestion and borderline cardiomegaly, with mild bibasilar airspace opacities, raising concern for mild interstitial edema.  These results were called by telephone on 12/03/2011  at  04:17 a.m. to  Nursing on [id number], who verbally acknowledged these results.  Original Report Authenticated By: Tonia Ghent, M.D.   Dg Chest Portable 1 View  12/03/2011  *RADIOLOGY REPORT*  Clinical Data: Central line placement; hyperglycemia.  PORTABLE CHEST - 1 VIEW  Comparison: Chest radiograph performed 12/02/2011  Findings: The patient's right subclavian line is noted ending deep within the right atrium; this should be retracted at least 4 cm.  The lungs are hypoexpanded.  Mild vascular congestion and vascular crowding are noted.  No definite interstitial edema is seen.  No focal consolidation, pleural effusion or pneumothorax is identified.  The cardiomediastinal silhouette is borderline normal in size.  No acute osseous abnormalities are identified.  IMPRESSION:  1.  Right subclavian line noted ending deep within the right atrium; this should be retracted at least 4 cm. 2.  Lungs hypoexpanded, with mild vascular congestion.  No focal  consolidation seen.  These results were called by telephone on 12/03/2011  at  01:38 a.m. to  Dr. Glynn Octave, who verbally acknowledged these results.  Original Report Authenticated By: Tonia Ghent, M.D.   Dg Chest Port 1 View  12/02/2011  *RADIOLOGY REPORT*  Clinical Data: 56 year old female with altered mental status.  PORTABLE CHEST - 1 VIEW  Comparison: None  Findings: Upper limits normal heart size and mild pulmonary vascular congestion noted. There is no evidence of focal airspace disease, pulmonary edema, suspicious pulmonary nodule/mass, pleural effusion, or pneumothorax. No acute bony abnormalities are identified.  IMPRESSION: Upper limits normal heart size and mild pulmonary vascular congestion.  Original Report Authenticated By: Rosendo Gros, M.D.   ROS: Patient intubated review of systems unobtainable GI: General: HEENT: Pulmonary: Cardiovascular: Other:           Blood pressure 91/53, pulse 84, temperature 98 F (36.7 C), temperature source Core (Comment), resp. rate 19, height 5\' 2"  (1.575 m), weight 106.4 kg (234 lb 9.1 oz), SpO2 100.00%.  Physical exam:   Gen.-obese African American female heavily sedated on the ventilator. Eyes-sclerae are nonicteric Lungs-coarse ventilator breath sounds Heart-grossly normal Abdomen-grossly obese with good bowel sounds and nontender  Assessment: 1. 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 She apparently has had lots of problems in the past with steroids regarding her diabetes but has responded dramatically regarding her autoimmune hepatitis. Her bilirubin has gone from 9.8 to normal after 2 weeks of prednisone. 2. Respiratory Failure probably due to aspiration pneumonia. She appears to have active pneumonia and is being treated for this with antibiotics. 3. Severe Hyperglycemia. On insulin drip. This is almost certainly exacerbated by recent resumption of  steroids.   Plan: Patients liver should continue to respond to the prednisone. When she is extubated and taking by mouth, we did go ahead and resume 6-MP 50 mg daily and began to taper rapidly from the prednisone. I have discussed this with her husband. I have emphasized to him that she will likely need  to be on 6-MP in some dose indefinitely.   Iliani Vejar JR,Dalayah Deahl L 12/04/2011, 1:35 PM

## 2011-12-05 ENCOUNTER — Inpatient Hospital Stay (HOSPITAL_COMMUNITY): Payer: 59

## 2011-12-05 DIAGNOSIS — E860 Dehydration: Secondary | ICD-10-CM

## 2011-12-05 LAB — CBC
Hemoglobin: 10.9 g/dL — ABNORMAL LOW (ref 12.0–15.0)
MCH: 28.7 pg (ref 26.0–34.0)
MCV: 88.9 fL (ref 78.0–100.0)
RBC: 3.8 MIL/uL — ABNORMAL LOW (ref 3.87–5.11)

## 2011-12-05 LAB — DIFFERENTIAL
Eosinophils Absolute: 0 10*3/uL (ref 0.0–0.7)
Lymphs Abs: 1.3 10*3/uL (ref 0.7–4.0)
Monocytes Relative: 3 % (ref 3–12)
Neutrophils Relative %: 91 % — ABNORMAL HIGH (ref 43–77)

## 2011-12-05 LAB — CULTURE, RESPIRATORY W GRAM STAIN: Gram Stain: NONE SEEN

## 2011-12-05 LAB — BASIC METABOLIC PANEL
BUN: 41 mg/dL — ABNORMAL HIGH (ref 6–23)
Calcium: 8.2 mg/dL — ABNORMAL LOW (ref 8.4–10.5)
Creatinine, Ser: 1.2 mg/dL — ABNORMAL HIGH (ref 0.50–1.10)
GFR calc Af Amer: 58 mL/min — ABNORMAL LOW (ref >90)
GFR calc non Af Amer: 50 mL/min — ABNORMAL LOW (ref >90)
Potassium: 4.2 mEq/L (ref 3.5–5.1)

## 2011-12-05 LAB — GLUCOSE, CAPILLARY
Glucose-Capillary: 155 mg/dL — ABNORMAL HIGH (ref 70–99)
Glucose-Capillary: 202 mg/dL — ABNORMAL HIGH (ref 70–99)
Glucose-Capillary: 201 mg/dL — ABNORMAL HIGH (ref 70–99)
Glucose-Capillary: 111 mg/dL — ABNORMAL HIGH (ref 70–99)
Glucose-Capillary: 119 mg/dL — ABNORMAL HIGH (ref 70–99)
Glucose-Capillary: 151 mg/dL — ABNORMAL HIGH (ref 70–99)
Glucose-Capillary: 175 mg/dL — ABNORMAL HIGH (ref 70–99)
Glucose-Capillary: 194 mg/dL — ABNORMAL HIGH (ref 70–99)

## 2011-12-05 LAB — COMPREHENSIVE METABOLIC PANEL
AST: 49 U/L — ABNORMAL HIGH (ref 0–37)
Albumin: 2.4 g/dL — ABNORMAL LOW (ref 3.5–5.2)
Calcium: 7.9 mg/dL — ABNORMAL LOW (ref 8.4–10.5)
Creatinine, Ser: 1.42 mg/dL — ABNORMAL HIGH (ref 0.50–1.10)
GFR calc non Af Amer: 41 mL/min — ABNORMAL LOW (ref >90)
Total Protein: 6.1 g/dL (ref 6.0–8.3)

## 2011-12-05 MED ORDER — PANTOPRAZOLE SODIUM 40 MG PO PACK
40.0000 mg | PACK | Freq: Every day | ORAL | Status: DC
  Filled 2011-12-05: qty 20

## 2011-12-05 MED ORDER — INSULIN ASPART 100 UNIT/ML ~~LOC~~ SOLN
0.0000 [IU] | Freq: Three times a day (TID) | SUBCUTANEOUS | Status: DC
  Administered 2011-12-05 – 2011-12-06 (×2): 3 [IU] via SUBCUTANEOUS
  Administered 2011-12-06 (×2): 5 [IU] via SUBCUTANEOUS
  Administered 2011-12-07: 8 [IU] via SUBCUTANEOUS

## 2011-12-05 MED ORDER — DEXTROSE 5 % IV SOLN
INTRAVENOUS | Status: DC
  Administered 2011-12-05 – 2011-12-07 (×3): via INTRAVENOUS

## 2011-12-05 MED ORDER — INSULIN ASPART 100 UNIT/ML ~~LOC~~ SOLN
0.0000 [IU] | Freq: Every day | SUBCUTANEOUS | Status: DC
  Administered 2011-12-05: 2 [IU] via SUBCUTANEOUS

## 2011-12-05 MED ORDER — INSULIN GLARGINE 100 UNIT/ML ~~LOC~~ SOLN
10.0000 [IU] | SUBCUTANEOUS | Status: DC
  Administered 2011-12-05 – 2011-12-06 (×2): 10 [IU] via SUBCUTANEOUS

## 2011-12-05 MED ORDER — MIDAZOLAM HCL 2 MG/2ML IJ SOLN
1.0000 mg | INTRAMUSCULAR | Status: DC | PRN
  Administered 2011-12-05: 5 mg via INTRAVENOUS
  Filled 2011-12-05: qty 6

## 2011-12-05 MED ORDER — DEXTROSE 5 % IV SOLN
1.0000 g | INTRAVENOUS | Status: AC
  Administered 2011-12-05 – 2011-12-09 (×5): 1 g via INTRAVENOUS
  Filled 2011-12-05 (×5): qty 10

## 2011-12-05 NOTE — Progress Notes (Signed)
Extubated @ 10:20

## 2011-12-05 NOTE — H&P (Signed)
. Name: Dana Thornton MRN: [id number] DOB: 11-04-1955    LOS: 3  PCCM ADMISSION NOTE  History of Present Illness:  56 yr old DM / HTN, auto immune hepatitis.  3-4 days n/v, upper abdominal pain and yellowin gof eyes. 2 days to primary  Treated vicodin / phenergan for abdo pain  / n/v. Became unresponsive 8 hrs prior to admission, delayed. Presented to [facility]. Temp 91, gl;u 1050. Intubated for airway protection. Insulin drip started. Fluid given to ICU  Lines / Drains: ETT 3/25>>> Rt [place] 3/25>>>  Cultures: BC 3/25>>> Urine 3/25>>> mrsa pcr neg  Antibiotics: 3/25 zosyn>>>3/27 3/25 vanc>>>off 3/27 ceftriaxone>>>stop date 3/31  Tests / Events: 3/25- intubated, line placed 3/25 Ct abdo- Bilateral lower lobe airspace opacification raises concern for  pneumonia, given associated air bronchograms.  2. Mild scattered diverticulosis along the descending proximal  sigmoid colon, without definite evidence of diverticulitis.  3. Vaguely increased attenuation within the gallbladder raises  concern for sludge; no definite stones identified within the  gallbladder. No evidence for obstruction or cholecystitis. 3/25 ct head - neg 3/26- hyperglycemia, higher prop needs 3/27- follow commands, Na correction slow  Vital Signs: Temp:  [96.1 F (35.6 C)-98.5 F (36.9 C)] 98 F (36.7 C) (03/27 0800) Pulse Rate:  [58-97] 70  (03/27 0800) Resp:  [8-24] 19  (03/27 0800) BP: (82-125)/(45-73) 115/59 mmHg (03/27 0800) SpO2:  [96 %-100 %] 100 % (03/27 0800) FiO2 (%):  [39.7 %-40.3 %] 39.9 % (03/27 0800) Weight:  [111.8 kg (246 lb 7.6 oz)] 111.8 kg (246 lb 7.6 oz) (03/27 0500) I/O last 3 completed shifts: In: 5918.6 [I.V.:5003.6; NG/GT:740; IV Piggyback:175] Out: 2290 [Urine:2290]  Physical Examination: General:  anxiety Neuro:  rass 0, weak diffuse HEENT:  No jvd, perrl 3 mm sluggish, no jaundice Neck:  ett Cardiovascular:  s1 s2 RRR Lungs:  CTA Abdomen:  Soft, bs obese, no r/g, liver  border 2 cm  Ventilator settings: Vent Mode:  [-] PRVC FiO2 (%):  [39.7 %-40.3 %] 39.9 % Set Rate:  [14 bmp-15 bmp] 15 bmp Vt Set:  [400 mL-450 mL] 450 mL PEEP:  [5 cmH20-5.5 cmH20] 5 cmH20 Pressure Support:  [8 cmH20] 8 cmH20 Plateau Pressure:  [17 cmH20-22 cmH20] 22 cmH20  Labs and Imaging:  Reviewed.  Please refer to the Assessment and Plan section for relevant results. CBC    Component Value Date/Time   WBC 20.3* 12/05/2011 0425   RBC 3.80* 12/05/2011 0425   HGB 10.9* 12/05/2011 0425   HCT 33.8* 12/05/2011 0425   PLT 133* 12/05/2011 0425   MCV 88.9 12/05/2011 0425   MCH 28.7 12/05/2011 0425   MCHC 32.2 12/05/2011 0425   RDW 18.9* 12/05/2011 0425   LYMPHSABS 1.3 12/05/2011 0425   MONOABS 0.6 12/05/2011 0425   EOSABS 0.0 12/05/2011 0425   BASOSABS 0.0 12/05/2011 0425    BMET    Component Value Date/Time   NA 158* 12/05/2011 0425   K 4.0 12/05/2011 0425   CL 125* 12/05/2011 0425   CO2 27 12/05/2011 0425   GLUCOSE 230* 12/05/2011 0425   BUN 49* 12/05/2011 0425   CREATININE 1.42* 12/05/2011 0425   CALCIUM 7.9* 12/05/2011 0425   GFRNONAA 41* 12/05/2011 0425   GFRAA 47* 12/05/2011 0425    ABG    Component Value Date/Time   PHART 7.321* 12/04/2011 0411   PCO2ART 47.7* 12/04/2011 0411   PO2ART 71.0* 12/04/2011 0411   HCO3 24.8* 12/04/2011 0411   TCO2 26 12/04/2011 0411  ACIDBASEDEF 2.0 12/04/2011 0411   O2SAT 93.0 12/04/2011 0411    Assessment and Plan: Acute resp failure, type 4 Improved MS, wean cpap 5ps 5 , goal 30 min  Assess cough, strength, rsbi -pcxr reviewed, no defined  infiltrates   Acute renal failure, improved -pre renal likely -chem in pm , d5w addition  Needed Dc 1/2 NS continue free water  Likely asp PNA culture neg, dc zosyn Add ceftriaxone stop date 31st  Change in  MS -CT head neg -Correct na slowly -continue lactulose, improved with this  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) lft wnl Gi eval , appreciated, plan noted   HNNK / small degree  dka -resolved Correct Na Insulin drip will continue to be needed with d5 addition  Hypernatremia Na in pm , as we change top d5w  malnurished -tf, hold ppi  At risk DVT sub q hep  aggitation Propofol now off wua successful NO versed  Best practices / Disposition: -->ICU status under PCCM -->full code -->Heparin for DVT Px -->Protonix for GI Px -->ventilator bundle -->diet TF  -->husband updated at bedside    The patient is critically ill with multiple organ systems failure and requires high complexity decision making for assessment and support, frequent evaluation and titration of therapies, application of advanced monitoring technologies and extensive interpretation of multiple databases. Critical Care Time devoted to patient care services described in this note is 30 minutes.  Nelda Bucks 12/05/2011, 8:51 AM  Mcarthur Rossetti. Tyson Alias, MD, FACP Pgr: [phone] [facility]

## 2011-12-05 NOTE — Progress Notes (Signed)
UR Completed.  [name] [phone] 12/05/2011  

## 2011-12-05 NOTE — Procedures (Signed)
Extubation Procedure Note  Patient Details:   Name: Dana Thornton DOB: 26-Dec-1955 MRN: [id number]   Airway Documentation:     Evaluation  O2 sats: stable throughout Complications: No apparent complications Patient did tolerate procedure well. Bilateral Breath Sounds: Clear Suctioning: Airway Yes  Joylene John 12/05/2011, 11:16 AM

## 2011-12-05 NOTE — Progress Notes (Signed)
Pt incontinent of urine all day. Pt skin and linen constantly soiled. Pt agreed to allow RN re-insert foley catheter to protect her skin.

## 2011-12-06 ENCOUNTER — Inpatient Hospital Stay (HOSPITAL_COMMUNITY): Payer: 59

## 2011-12-06 LAB — GLUCOSE, CAPILLARY
Glucose-Capillary: 219 mg/dL — ABNORMAL HIGH (ref 70–99)
Glucose-Capillary: 207 mg/dL — ABNORMAL HIGH (ref 70–99)
Glucose-Capillary: 157 mg/dL — ABNORMAL HIGH (ref 70–99)

## 2011-12-06 LAB — COMPREHENSIVE METABOLIC PANEL
BUN: 35 mg/dL — ABNORMAL HIGH (ref 6–23)
Calcium: 8.3 mg/dL — ABNORMAL LOW (ref 8.4–10.5)
GFR calc Af Amer: 75 mL/min — ABNORMAL LOW (ref >90)
GFR calc non Af Amer: 65 mL/min — ABNORMAL LOW (ref >90)
Glucose, Bld: 225 mg/dL — ABNORMAL HIGH (ref 70–99)
Total Protein: 6.3 g/dL (ref 6.0–8.3)

## 2011-12-06 LAB — DIFFERENTIAL
Basophils Absolute: 0 10*3/uL (ref 0.0–0.1)
Lymphocytes Relative: 14 % (ref 12–46)
Monocytes Absolute: 0.6 10*3/uL (ref 0.1–1.0)
Monocytes Relative: 5 % (ref 3–12)
Neutro Abs: 11.4 10*3/uL — ABNORMAL HIGH (ref 1.7–7.7)
Neutrophils Relative %: 82 % — ABNORMAL HIGH (ref 43–77)

## 2011-12-06 LAB — BASIC METABOLIC PANEL
CO2: 28 mEq/L (ref 19–32)
Calcium: 8.3 mg/dL — ABNORMAL LOW (ref 8.4–10.5)
GFR calc Af Amer: 80 mL/min — ABNORMAL LOW (ref >90)
GFR calc non Af Amer: 69 mL/min — ABNORMAL LOW (ref >90)
Sodium: 152 mEq/L — ABNORMAL HIGH (ref 135–145)

## 2011-12-06 LAB — CBC
HCT: 35.3 % — ABNORMAL LOW (ref 36.0–46.0)
Hemoglobin: 11.3 g/dL — ABNORMAL LOW (ref 12.0–15.0)
RDW: 18.4 % — ABNORMAL HIGH (ref 11.5–15.5)
WBC: 14 10*3/uL — ABNORMAL HIGH (ref 4.0–10.5)

## 2011-12-06 MED ORDER — LABETALOL HCL 5 MG/ML IV SOLN
10.0000 mg | INTRAVENOUS | Status: DC | PRN
  Administered 2011-12-06: 10 mg via INTRAVENOUS
  Filled 2011-12-06: qty 4

## 2011-12-06 MED ORDER — PANTOPRAZOLE SODIUM 40 MG PO TBEC
40.0000 mg | DELAYED_RELEASE_TABLET | Freq: Every day | ORAL | Status: DC
  Administered 2011-12-06 – 2011-12-10 (×5): 40 mg via ORAL
  Filled 2011-12-06 (×5): qty 1

## 2011-12-06 MED ORDER — ONDANSETRON HCL 4 MG/2ML IJ SOLN
4.0000 mg | Freq: Once | INTRAMUSCULAR | Status: AC
  Administered 2011-12-06: 4 mg via INTRAVENOUS

## 2011-12-06 MED ORDER — LABETALOL HCL 5 MG/ML IV SOLN
INTRAVENOUS | Status: AC
  Administered 2011-12-06: 10 mg via INTRAVENOUS
  Filled 2011-12-06: qty 4

## 2011-12-06 MED ORDER — LACTULOSE 10 GM/15ML PO SOLN
30.0000 g | Freq: Two times a day (BID) | ORAL | Status: DC
  Filled 2011-12-06 (×3): qty 45

## 2011-12-06 NOTE — Evaluation (Signed)
Physical Therapy Evaluation Patient Details Name: Dana Thornton MRN: [id number] DOB: 1955-09-20 Today's Date: 12/06/2011  Problem List:  Patient Active Problem List  Diagnoses  . Acute respiratory failure  . DKA, type 2, not at goal  . Hepatitis    Past Medical History:  Past Medical History  Diagnosis Date  . Diabetes mellitus   . Hypertension    Past Surgical History: History reviewed. No pertinent past surgical history.  PT Assessment/Plan/Recommendation PT Assessment Clinical Impression Statement: Pt admitted with hepatitis and unresponsiveness on vent 3/25-27. Pt currently lethargic maintaining neck flexion and unable to control oral secretions drooling throughout tx. Pt with decreased coordination, strength and control. Will benefit from acute therapy to maximize function, strength, and activity tolerance prior to transfer to next venue. PT Recommendation/Assessment: Patient will need skilled PT in the acute care venue PT Problem List: Decreased balance;Decreased strength;Decreased mobility;Decreased activity tolerance;Decreased coordination;Decreased cognition;Decreased knowledge of use of DME Barriers to Discharge: Other (comment) Barriers to Discharge Comments: spouse works but pt states he could provide 24hr assist if needed PT Therapy Diagnosis : Generalized weakness;Altered mental status PT Plan PT Frequency: Min 3X/week PT Treatment/Interventions: DME instruction;Gait training;Functional mobility training;Balance training;Therapeutic exercise;Therapeutic activities;Patient/family education;Cognitive remediation PT Recommendation Recommendations for Other Services: OT consult;Speech consult;Rehab consult Follow Up Recommendations: Inpatient Rehab Equipment Recommended: Defer to next venue PT Goals  Acute Rehab PT Goals PT Goal Formulation: With patient Time For Goal Achievement: 2 weeks Pt will go Supine/Side to Sit: with supervision;with HOB 0 degrees PT Goal:  Supine/Side to Sit - Progress: Goal set today Pt will Sit at [facility] of Bed: with supervision;3-5 min;with no upper extremity support PT Goal: Sit at Edge Of Bed - Progress: Goal set today Pt will go Sit to Supine/Side: with min assist PT Goal: Sit to Supine/Side - Progress: Goal set today Pt will go Sit to Stand: with min assist PT Goal: Sit to Stand - Progress: Goal set today Pt will go Stand to Sit: with min assist PT Goal: Stand to Sit - Progress: Goal set today Pt will Transfer Bed to Chair/Chair to Bed: with min assist PT Transfer Goal: Bed to Chair/Chair to Bed - Progress: Goal set today Pt will Ambulate: 16 - 50 feet;with min assist;with least restrictive assistive device PT Goal: Ambulate - Progress: Goal set today  PT Evaluation Precautions/Restrictions  Precautions Precautions: Fall Prior Functioning  Home Living Lives With: Spouse Type of Home: House Home Layout: One level Home Access: Level entry Bathroom Shower/Tub: Tub/shower unit;Curtain Bathroom Toilet: Standard Home Adaptive Equipment: None Prior Function Level of Independence: Independent with basic ADLs;Independent with homemaking with wheelchair;Independent with gait;Independent with transfers Driving: Yes Vocation: Full time employment Comments: Pt is a Clinical biochemist rep for [employer] Cognition Cognition Arousal/Alertness: Lethargic Overall Cognitive Status: Impaired Attention: Impaired Current Attention Level: Sustained Orientation Level: Oriented to person;Oriented to place;Oriented to time Sensation/Coordination Sensation Light Touch: Appears Intact Coordination Gross Motor Movements are Fluid and Coordinated: No Fine Motor Movements are Fluid and Coordinated: No Coordination and Movement Description: Pt unable to maintain grasp with bil UE Heel Shin Test: unable to complete heel drops off shin with movement Extremity Assessment RLE Assessment RLE Assessment: Exceptions to [facility] RLE Strength RLE  Overall Strength Comments: grossly 2+/5, pt able to initiate heel slides but assist to complete, and able to bring legs to EOB but not back into bed. Unable to formally assess secondary to pt fatigue and decreased coordination LLE Assessment LLE Assessment: Exceptions to [facility] LLE Strength LLE Overall  Strength Comments: grossly 2+/5, pt able to initiate heel slides but assist to complete, and able to bring legs to EOB but not back into bed. Unable to formally assess secondary to pt fatigue and decreased coordination Mobility (including Balance) Bed Mobility Bed Mobility: Yes Rolling Right: 5: Supervision;With rail Rolling Right Details (indicate cue type and reason): increased time and cueing to reach for rail Right Sidelying to Sit: 4: Min assist;HOB elevated (comment degrees) (HOB 20degrees) Right Sidelying to Sit Details (indicate cue type and reason): increased time and cueing for sequence to complete transfer Sitting - Scoot to Edge of Bed: 4: Min assist Sitting - Scoot to [employer] of Bed Details (indicate cue type and reason): assist with pad to complete reciprocal scooting Sit to Supine: 2: Max assist;HOB flat Sit to Supine - Details (indicate cue type and reason): gravity assisted upper body and max assist for lower body into bed Scooting to HOB: 1: +2 Total assist;Patient percentage (comment) (pt=60%) Scooting to [facility] Details (indicate cue type and reason): with bed in trendelenburg and assist to place bil LE into knee flexion and cueing pt able to scoot toward [facility] with supervision but required total assist at end of transfer to complete last couple of inches due to pt fatigue Transfers Transfers: Yes Sit to Stand: 3: Mod assist;From bed Sit to Stand Details (indicate cue type and reason): 1st trial from bed pt used momentum to stand from bed. Pt reported dizziness in standing with BP elevated and returned to sitting. Second trial with max assist pt unable to stand and returned to  supine Stand to Sit: 3: Mod assist;To bed Stand to Sit Details: assist to control descent secondary to pt reported dizziness then sat unexpectedly Ambulation/Gait Ambulation/Gait: No Stairs: No  Posture/Postural Control Posture/Postural Control: Postural limitations Postural Limitations: pt maintaining neck flexion and trunk flexion EOB Balance Balance Assessed: Yes Static Sitting Balance Static Sitting - Balance Support: Feet supported;Bilateral upper extremity supported Static Sitting - Level of Assistance: 4: Min assist Static Sitting - Comment/# of Minutes: pt with Right lean and unable to control balance with bil UE secondary to lack of gross motor coordination and unable to maintain grasp with bil UE Exercise    End of Session PT - End of Session Equipment Utilized During Treatment: Gait belt Activity Tolerance: Patient limited by fatigue Patient left: in bed;with call bell in reach Nurse Communication: Mobility status for transfers General Behavior During Session: Lethargic Cognition: Impaired  Delorse Lek 12/06/2011, 3:59 PM  Delaney Meigs, PT [phone]

## 2011-12-06 NOTE — Progress Notes (Signed)
Subjective: Extubated yesterday. Is hungry.  Objective: Vital signs in last 24 hours: Temp:  [97.4 F (36.3 C)-98.7 F (37.1 C)] 98.2 F (36.8 C) (03/27 1500) Pulse Rate:  [58-101] 64  (03/28 0800) Resp:  [13-25] 18  (03/27 2200) BP: (88-194)/(56-113) 156/83 mmHg (03/28 0800) SpO2:  [96 %-100 %] 99 % (03/28 0800) FiO2 (%):  [39.7 %] 39.7 % (03/27 1000) Weight:  [111.5 kg (245 lb 13 oz)] 111.5 kg (245 lb 13 oz) (03/28 0500) Weight change: -0.3 kg (-10.6 oz) Last BM Date: 12/04/11  PE: GEN:  Somnolent, intermittently confused ABD:  Protuberant, soft  Lab Results: CBC    Component Value Date/Time   WBC 14.0* 12/06/2011 0500   RBC 4.02 12/06/2011 0500   HGB 11.3* 12/06/2011 0500   HCT 35.3* 12/06/2011 0500   PLT 130* 12/06/2011 0500   MCV 87.8 12/06/2011 0500   MCH 28.1 12/06/2011 0500   MCHC 32.0 12/06/2011 0500   RDW 18.4* 12/06/2011 0500   LYMPHSABS 1.9 12/06/2011 0500   MONOABS 0.6 12/06/2011 0500   EOSABS 0.0 12/06/2011 0500   BASOSABS 0.0 12/06/2011 0500   CMP     Component Value Date/Time   NA 155* 12/06/2011 0500   K 4.3 12/06/2011 0500   CL 122* 12/06/2011 0500   CO2 28 12/06/2011 0500   GLUCOSE 225* 12/06/2011 0500   BUN 35* 12/06/2011 0500   CREATININE 0.97 12/06/2011 0500   CALCIUM 8.3* 12/06/2011 0500   PROT 6.3 12/06/2011 0500   ALBUMIN 2.4* 12/06/2011 0500   AST 47* 12/06/2011 0500   ALT 71* 12/06/2011 0500   ALKPHOS 107 12/06/2011 0500   BILITOT 1.2 12/06/2011 0500   GFRNONAA 65* 12/06/2011 0500   GFRAA 75* 12/06/2011 0500   Assessment: 1.  Autoimmune hepatitis, quiescent. 2.  Hyperglycemic coma, from prednisone, used to treat #1 above.  Plan:  1.  Continue IV solumedrol today, as I am not convinced she can reliably take PO medications at this time. 2.  Perhaps tomorrow, we can start 6-mercaptopurine in conjunction with low-dose prednisone; she will require low-dose prednisone for a period of at least 4 weeks, as it takes several weeks for to achieve  steady-state concentration. 3.  Dr. Randa Evens to follow tomorrow.   Freddy Jaksch 12/06/2011, 9:55 AM

## 2011-12-06 NOTE — Progress Notes (Signed)
. Name: Dana Thornton MRN: [id number] DOB: [birth year]    LOS: 4  PCCM ADMISSION NOTE  History of Present Illness:  56 yr old DM / HTN, auto immune hepatitis.  3-4 days n/v, upper abdominal pain and yellowin gof eyes. 2 days to primary  Treated vicodin / phenergan for abdo pain  / n/v. Became unresponsive 8 hrs prior to admission, delayed. Presented to [facility]. Temp 91, gl;u 1050. Intubated for airway protection. Insulin drip started. Fluid given to ICU  Lines / Drains: ETT 3/25>>>3/27 Rt [place] 3/25>>>3/28  Cultures: [facility] 3/25>>> Urine 3/25>>> mrsa pcr neg  Antibiotics: 3/25 zosyn>>>3/27 3/25 vanc>>>off 3/27 ceftriaxone>>>stop date 3/31  Tests / Events: 3/25- intubated, line placed 3/25 Ct abdo- Bilateral lower lobe airspace opacification raises concern for  pneumonia, given associated air bronchograms.  2. Mild scattered diverticulosis along the descending proximal  sigmoid colon, without definite evidence of diverticulitis.  3. Vaguely increased attenuation within the gallbladder raises  concern for sludge; no definite stones identified within the  gallbladder. No evidence for obstruction or cholecystitis. 3/25 ct head - neg 3/26- hyperglycemia, higher prop needs 3/27- follow commands, Na correction slow 3/27- extubated   Vital Signs: Temp:  [97.4 F (36.3 C)-98.7 F (37.1 C)] 98.2 F (36.8 C) (03/27 1500) Pulse Rate:  [58-101] 62  (03/28 1000) Resp:  [13-22] 18  (03/27 2200) BP: (88-194)/(56-113) 148/86 mmHg (03/28 1000) SpO2:  [96 %-100 %] 99 % (03/28 1000) Weight:  [111.5 kg (245 lb 13 oz)] 111.5 kg (245 lb 13 oz) (03/28 0500) I/O last 3 completed shifts: In: 3917.9 [I.V.:3547.9; NG/GT:270; IV Piggyback:100] Out: 2141 [Urine:2141]  Physical Examination: General:  calm Neuro:  Nonfocal, weak diffuse HEENT:  No jvd, perrl 3 mm sluggish, no jaundice Neck:  ett Cardiovascular:  s1 s2 RRR Lungs:  CTA Abdomen:  Soft, bs obese, no r/g, liver border 2  cm  Ventilator settings:    Labs and Imaging:  Reviewed.  Please refer to the Assessment and Plan section for relevant results. CBC    Component Value Date/Time   WBC 14.0* 12/06/2011 0500   RBC 4.02 12/06/2011 0500   HGB 11.3* 12/06/2011 0500   HCT 35.3* 12/06/2011 0500   PLT 130* 12/06/2011 0500   MCV 87.8 12/06/2011 0500   MCH 28.1 12/06/2011 0500   MCHC 32.0 12/06/2011 0500   RDW 18.4* 12/06/2011 0500   LYMPHSABS 1.9 12/06/2011 0500   MONOABS 0.6 12/06/2011 0500   EOSABS 0.0 12/06/2011 0500   BASOSABS 0.0 12/06/2011 0500    BMET    Component Value Date/Time   NA 155* 12/06/2011 0500   K 4.3 12/06/2011 0500   CL 122* 12/06/2011 0500   CO2 28 12/06/2011 0500   GLUCOSE 225* 12/06/2011 0500   BUN 35* 12/06/2011 0500   CREATININE 0.97 12/06/2011 0500   CALCIUM 8.3* 12/06/2011 0500   GFRNONAA 65* 12/06/2011 0500   GFRAA 75* 12/06/2011 0500    ABG    Component Value Date/Time   PHART 7.321* 12/04/2011 0411   PCO2ART 47.7* 12/04/2011 0411   PO2ART 71.0* 12/04/2011 0411   HCO3 24.8* 12/04/2011 0411   TCO2 26 12/04/2011 0411   ACIDBASEDEF 2.0 12/04/2011 0411   O2SAT 93.0 12/04/2011 0411    Assessment and Plan: Acute resp failure, type 4 IS No distress Even to neg goal  Acute renal failure, improved -pre renal likely -chem in pm and am  continue free water d5w , slight increase  Likely asp PNA ceftriaxone stop date 31st  afebrile  Change in  MS -CT head neg -Correct na slowly -continue lactulose, increase slight  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) Gi to start pred / in am  In am ?  HNNK / small degree dka -resolved Correct Na Insulin drip off ssi lantus  Hypernatremia Na in pm, d5w to 100  At risk DVT sub q hep Ambulate if able  Best practices / Disposition: -->ICU status under PCCM, to triad in am 3/29 -->full code -->Heparin for DVT Px -->Protonix for GI Px -->diet  -->husband updated at bedside  Nelda Bucks. 12/06/2011, 10:19 AM  Mcarthur Rossetti. Tyson Alias, MD, FACP Pgr: [phone] [facility]

## 2011-12-06 NOTE — Progress Notes (Signed)
Inpatient Diabetes Program Recommendations  AACE/ADA: New Consensus Statement on Inpatient Glycemic Control (2009)  Target Ranges:  Prepandial:   less than 140 mg/dL      Peak postprandial:   less than 180 mg/dL (1-2 hours)      Critically ill patients:  140 - 180 mg/dL   Reason for Visit: Results for Dana Thornton, Dana Thornton (MRN [id number]) as of 12/06/2011 15:15  Ref. Range 12/05/2011 16:46 12/05/2011 16:49 12/06/2011 04:02 12/06/2011 07:27 12/06/2011 11:25  Glucose-Capillary Latest Range: 70-99 mg/dL 409 (H) 811 (H) 914 (H) 219 (H) 207 (H)    Inpatient Diabetes Program Recommendations Insulin - Basal: Consider increasing Lantus to 15 units daily. HgbA1C: Consider checking A1C to determine prehosp. glycemic control.  Note: Note that patient does have prior history of diabetes, however no home diabetes medications listed.  Patient is currently still on steroids.  A1C may be helpful in determining plan at discharge for diabetes.

## 2011-12-07 DIAGNOSIS — N179 Acute kidney failure, unspecified: Secondary | ICD-10-CM | POA: Diagnosis present

## 2011-12-07 DIAGNOSIS — E870 Hyperosmolality and hypernatremia: Secondary | ICD-10-CM | POA: Diagnosis present

## 2011-12-07 DIAGNOSIS — R17 Unspecified jaundice: Secondary | ICD-10-CM | POA: Diagnosis present

## 2011-12-07 DIAGNOSIS — E1159 Type 2 diabetes mellitus with other circulatory complications: Secondary | ICD-10-CM | POA: Diagnosis present

## 2011-12-07 DIAGNOSIS — K7682 Hepatic encephalopathy: Secondary | ICD-10-CM | POA: Diagnosis present

## 2011-12-07 DIAGNOSIS — I10 Essential (primary) hypertension: Secondary | ICD-10-CM | POA: Diagnosis present

## 2011-12-07 DIAGNOSIS — E119 Type 2 diabetes mellitus without complications: Secondary | ICD-10-CM | POA: Diagnosis present

## 2011-12-07 DIAGNOSIS — K7290 Hepatic failure, unspecified without coma: Secondary | ICD-10-CM | POA: Diagnosis present

## 2011-12-07 DIAGNOSIS — J690 Pneumonitis due to inhalation of food and vomit: Secondary | ICD-10-CM | POA: Diagnosis present

## 2011-12-07 LAB — GLUCOSE, CAPILLARY
Glucose-Capillary: 170 mg/dL — ABNORMAL HIGH (ref 70–99)
Glucose-Capillary: 208 mg/dL — ABNORMAL HIGH (ref 70–99)

## 2011-12-07 LAB — COMPREHENSIVE METABOLIC PANEL
ALT: 64 U/L — ABNORMAL HIGH (ref 0–35)
AST: 41 U/L — ABNORMAL HIGH (ref 0–37)
Alkaline Phosphatase: 108 U/L (ref 39–117)
CO2: 27 mEq/L (ref 19–32)
GFR calc Af Amer: 83 mL/min — ABNORMAL LOW (ref >90)
GFR calc non Af Amer: 72 mL/min — ABNORMAL LOW (ref >90)
Glucose, Bld: 283 mg/dL — ABNORMAL HIGH (ref 70–99)
Potassium: 4 mEq/L (ref 3.5–5.1)
Sodium: 144 mEq/L (ref 135–145)
Total Protein: 6.5 g/dL (ref 6.0–8.3)

## 2011-12-07 LAB — BASIC METABOLIC PANEL
BUN: 21 mg/dL (ref 6–23)
CO2: 27 mEq/L (ref 19–32)
Calcium: 8.5 mg/dL (ref 8.4–10.5)
Creatinine, Ser: 0.93 mg/dL (ref 0.50–1.10)
GFR calc non Af Amer: 68 mL/min — ABNORMAL LOW (ref >90)
Glucose, Bld: 222 mg/dL — ABNORMAL HIGH (ref 70–99)

## 2011-12-07 LAB — CBC
Hemoglobin: 11.7 g/dL — ABNORMAL LOW (ref 12.0–15.0)
Platelets: 125 10*3/uL — ABNORMAL LOW (ref 150–400)
RBC: 4.11 MIL/uL (ref 3.87–5.11)

## 2011-12-07 MED ORDER — RIFAXIMIN 200 MG PO TABS
200.0000 mg | ORAL_TABLET | Freq: Three times a day (TID) | ORAL | Status: DC
  Administered 2011-12-07 – 2011-12-08 (×5): 200 mg via ORAL
  Filled 2011-12-07 (×10): qty 1

## 2011-12-07 MED ORDER — LACTULOSE ENEMA: 300.0000 mL | Freq: Two times a day (BID) | ORAL | Status: DC

## 2011-12-07 MED ORDER — INSULIN ASPART 100 UNIT/ML ~~LOC~~ SOLN
0.0000 [IU] | Freq: Every day | SUBCUTANEOUS | Status: DC
Start: 2011-12-07 — End: 2011-12-08
  Administered 2011-12-07: 2 [IU] via SUBCUTANEOUS

## 2011-12-07 MED ORDER — LACTULOSE 10 GM/15ML PO SOLN: 30.0000 g | Freq: Two times a day (BID) | ORAL | Status: DC

## 2011-12-07 MED ORDER — MERCAPTOPURINE 50 MG PO TABS
50.0000 mg | ORAL_TABLET | Freq: Every day | ORAL | Status: DC
  Administered 2011-12-08 – 2011-12-10 (×3): 50 mg via ORAL
  Filled 2011-12-07 (×3): qty 1

## 2011-12-07 MED ORDER — INSULIN ASPART 100 UNIT/ML ~~LOC~~ SOLN
0.0000 [IU] | Freq: Three times a day (TID) | SUBCUTANEOUS | Status: DC
  Administered 2011-12-07 (×2): 7 [IU] via SUBCUTANEOUS
  Administered 2011-12-08: 11 [IU] via SUBCUTANEOUS
  Administered 2011-12-08: 4 [IU] via SUBCUTANEOUS
  Administered 2011-12-08: 15 [IU] via SUBCUTANEOUS
  Administered 2011-12-09: 3 [IU] via SUBCUTANEOUS
  Administered 2011-12-10: 11 [IU] via SUBCUTANEOUS
  Administered 2011-12-10: 7 [IU] via SUBCUTANEOUS

## 2011-12-07 MED ORDER — INSULIN GLARGINE 100 UNIT/ML ~~LOC~~ SOLN
20.0000 [IU] | Freq: Every day | SUBCUTANEOUS | Status: DC
  Administered 2011-12-07: 20 [IU] via SUBCUTANEOUS

## 2011-12-07 MED ORDER — BD GETTING STARTED TAKE HOME KIT: 1/2ML X 30G SYRINGES
1.0000 | Freq: Once | Status: DC
  Filled 2011-12-07: qty 1

## 2011-12-07 MED ORDER — INSULIN ASPART 100 UNIT/ML ~~LOC~~ SOLN
6.0000 [IU] | Freq: Three times a day (TID) | SUBCUTANEOUS | Status: DC
  Administered 2011-12-07 – 2011-12-08 (×3): 6 [IU] via SUBCUTANEOUS

## 2011-12-07 NOTE — Progress Notes (Signed)
ANTIBIOTIC CONSULT NOTE - FOLLOW UP  Pharmacy Consult for Ceftriaxone Indication: Aspiration Pneumonia  No Known Allergies  Patient Measurements: Height: 5\' 2"  (157.5 cm) Weight: 244 lb 2.9 oz (110.76 kg) IBW/kg (Calculated) : 50.1   Vital Signs: Temp: 97.2 F (36.2 C) (03/29 0740) Temp src: Oral (03/29 0740) BP: 135/78 mmHg (03/29 0600) Pulse Rate: 60  (03/29 0600) Intake/Output from previous day: 03/28 0701 - 03/29 0700 In: 2045 [P.O.:120; I.V.:1925] Out: 1820 [Urine:1820] Intake/Output from this shift:    Labs:  Basename 12/06/11 1743 12/06/11 0500 12/05/11 1800 12/05/11 0425  WBC -- 14.0* -- 20.3*  HGB -- 11.3* -- 10.9*  PLT -- 130* -- 133*  LABCREA -- -- -- --  CREATININE 0.92 0.97 1.20* --   Estimated Creatinine Clearance: 81.2 ml/min (by C-G formula based on Cr of 0.92). No results found for this basename: VANCOTROUGH:2,VANCOPEAK:2,VANCORANDOM:2,GENTTROUGH:2,GENTPEAK:2,GENTRANDOM:2,TOBRATROUGH:2,TOBRAPEAK:2,TOBRARND:2,AMIKACINPEAK:2,AMIKACINTROU:2,AMIKACIN:2, in the last 72 hours   Microbiology: Recent Results (from the past 720 hour(s))  URINE CULTURE     Status: Normal   Collection Time   12/02/11 10:22 PM      Component Value Range Status Comment   Specimen Description URINE, CATHETERIZED   Final    Special Requests NONE   Final    Culture  Setup Time [id number]   Final    Colony Count NO GROWTH   Final    Culture NO GROWTH   Final    Report Status 12/04/2011 FINAL   Final   CULTURE, BLOOD (ROUTINE X 2)     Status: Normal (Preliminary result)   Collection Time   12/02/11 10:45 PM      Component Value Range Status Comment   Specimen Description BLOOD RIGHT HAND   Final    Special Requests BOTTLES DRAWN AEROBIC ONLY 2CC   Final    Culture  Setup Time [id number]   Final    Culture     Final    Value:        BLOOD CULTURE RECEIVED NO GROWTH TO DATE CULTURE WILL BE HELD FOR 5 DAYS BEFORE ISSUING A FINAL NEGATIVE REPORT   Report Status PENDING    Incomplete   MRSA PCR SCREENING     Status: Normal   Collection Time   12/03/11  2:56 AM      Component Value Range Status Comment   MRSA by PCR NEGATIVE  NEGATIVE  Final   CULTURE, RESPIRATORY     Status: Normal   Collection Time   12/03/11 12:17 PM      Component Value Range Status Comment   Specimen Description ENDOTRACHEAL ASPIRATE   Final    Special Requests NONE   Final    Gram Stain     Final    Value: NO WBC SEEN     FEW SQUAMOUS EPITHELIAL CELLS PRESENT     RARE GRAM POSITIVE COCCI IN PAIRS   Culture Non-Pathogenic Oropharyngeal-type Flora Isolated.   Final    Report Status 12/05/2011 FINAL   Final     Anti-infectives     Start     Dose/Rate Route Frequency Ordered Stop   12/05/11 2200   cefTRIAXone (ROCEPHIN) 1 g in dextrose 5 % 50 mL IVPB        1 g 100 mL/hr over 30 Minutes Intravenous Every 24 hours 12/05/11 0858 12/10/11 2159   12/03/11 0800   piperacillin-tazobactam (ZOSYN) IVPB 3.375 g  Status:  Discontinued        3.375 g 12.5 mL/hr over 240 Minutes Intravenous  3 times per day 12/03/11 0550 12/05/11 0858   12/03/11 0030   vancomycin (VANCOCIN) IVPB 1000 mg/200 mL premix        1,000 mg 200 mL/hr over 60 Minutes Intravenous  Once 12/03/11 0026 12/03/11 0152   12/03/11 0030  piperacillin-tazobactam (ZOSYN) IVPB 3.375 g       3.375 g 12.5 mL/hr over 240 Minutes Intravenous  Once 12/03/11 0026 12/03/11 0602          Assessment: Admit Complaint: 56 yo F w/ hx of autoimmune hepatitis & steroid induced DM. Presents on 3/24 with nausea/vomiting/upper abd pain/ hyperglycemia/ yellowing of eyes.  Anticoagulation: Hep SQ for VTE ppx  Infectious Disease: Likely aspiration PNA: Zosyn switched to CTX. Afebrile. WBC elevated but trending down. PCT 0.45 (3/26). MRSA PCR neg. Zosyn 3/25>> 3/27 CTX 3/27 >> 3/31 (stop date in place) BCx 3/24>>NGTD UCx 3/24>>neg Resp Cx 3/25>> NPF (collected after start of abx)  Cardiovascular: BP ok to high - labetalol PRN added  3/28. HR ok. CE (-)x1 (3/24).   Endocrinology: Hx of DM. Presents with hyperosmolar, nonketotic hyperglycemia. BG at admission >600. ICU protocol Insulin infusion d/ced 3/27>>on lantus & SSI meal coverage, bedtime (BGs 157-283; trending up). Solumedrol 40 q12. -Steroids not weaned since 3/25. Recommend weaning steroids which will help to improve CBGs. Rise in CBGs may be also related to D5 at 139ml/hr for elevated Na.   Gastrointestinal / Nutrition: Carb modified diet. OG tube removed. PPI PO. LFTs barely elevated (solumedrol IV on board for hepatitis). GI consult:  Resumed 6-MP today (hopefully wean steroid soon)-dose appropriate. Follow-up LFTs, renal function, and CBC.   Neurology: A&O. Follows commands. Fentanyl and propofol drips d/ced (AVOID versed with autoimmune hepatitis). Lactulose increased yesterday- initiated for change in MS in setting of autoimmune hepatitis; MS improved--unsure plan for duration.   Nephrology: Scr trending down. UOP 0.12ml/kg/hr. Na 168 (corrected) on admission; still elevated but trending down (on D5 @100ml /hr). Free water 200 q8h d/ced. High chloride trending down. Other lytes OK.   Pulmonary: Extubated 3/27. On 3L O2 nasal cannula (sat 100%).  Hematology / Oncology: Hgb/Hct increasing. Plts dropping (Plts 297 on admit >>164 >> 130; currently below 1/2 of admit Plt count; consider possibility of HIT however likely autoimmune hepatitis playing a role; day 4 of SQ Heparin)- on Hep SQ.   PTA Medication Issues: Norco PRN, promethazine PRN.  Best Practices: Hep SQ for VTE ppx, PPI for SUP  Plan:  Will f/u on renal function and cultures.   Delphia Grates, PharmD Candidate 12/07/2011,8:55 AM  I agree with above assessment. See edits in blue.   Recommendations: 1. Re-evaluate need for continued lactulose with improved Mental Status. 2. Recommend tapering Solumedrol-last changed on 3/25. Currently on Solumedrol 40mg  IV q12h. This will likely help to improve  CBGs. 3. Watch Platelets closely (admit 297-now day 4 of sq heparin and 130; likely hepatitis is a component of this, but need to consider HIT).   Link Snuffer, PharmD, BCPS Clinical Pharmacist [phone] 12/07/2011, 10:29 AM

## 2011-12-07 NOTE — Progress Notes (Signed)
Inpatient Diabetes Program Recommendations  AACE/ADA: New Consensus Statement on Inpatient Glycemic Control (2009)  Target Ranges:  Prepandial:   less than 140 mg/dL      Peak postprandial:   less than 180 mg/dL (1-2 hours)      Critically ill patients:  140 - 180 mg/dL   Reason for Visit: Patient admitted with hyperglycemia.  No A1C available to assess pre-hospitalization glycemic control.  Lantus increased this AM and meal coverage added.  Ordered insulin teaching/ videos for patient when appropriate (just in case patient is to be d/c'd home on insulin).  Discussed with RN and patient is still very sleepy. Not appropriate for education at this time.  Will follow.

## 2011-12-07 NOTE — Progress Notes (Signed)
[facility]   Subjective: Awakened easily but confused and seemed somewhat lethargic. Denies shortness of breath or chest pain  Objective: Vital signs in last 24 hours: Temp:  [97.2 F (36.2 C)-98.4 F (36.9 C)] 98.4 F (36.9 C) (03/29 1054) Pulse Rate:  [57-94] 82  (03/29 1054) Resp:  [20] 20  (03/29 1054) BP: (99-191)/(58-136) 164/88 mmHg (03/29 1054) SpO2:  [89 %-100 %] 99 % (03/29 1054) Weight:  [110.76 kg (244 lb 2.9 oz)] 110.76 kg (244 lb 2.9 oz) (03/29 0500) Weight change: -0.74 kg (-1 lb 10.1 oz) Last BM Date: 12/04/11  Intake/Output from previous day: 03/28 0701 - 03/29 0700 In: 2145 [P.O.:120; I.V.:2025] Out: 1820 [Urine:1820] Intake/Output this shift: Total I/O In: 200 [I.V.:200] Out: 350 [Urine:350]  General appearance: cooperative, appears older than stated age, icteric, no distress and slowed mentation Resp: clear to auscultation bilaterally, 3 L of oxygen with saturations 100%. Saturations between 94 and 96% on room air previously. Cardio: regular rate and rhythm, S1, S2 normal, no murmur, click, rub or gallop GI: soft, non-tender; bowel sounds normal; no masses,  no organomegaly Extremities: extremities normal, atraumatic, no cyanosis or edema Neurologic: Grossly normal except for lethargy and confusion. Patient endorses she's only been diagnosed with autoimmune hepatitis for one week which conflicts with her medical record.  Lab Results:  Basename 12/07/11 1008 12/06/11 0500  WBC 12.6* 14.0*  HGB 11.7* 11.3*  HCT 35.5* 35.3*  PLT 125* 130*   BMET  Basename 12/07/11 1008 12/06/11 1743  NA 144 152*  K 4.0 3.7  CL 111 117*  CO2 27 28  GLUCOSE 283* 162*  BUN 23 30*  CREATININE 0.89 0.92  CALCIUM 8.3* 8.3*    Studies/Results: Dg Chest Port 1 View  12/06/2011  *RADIOLOGY REPORT*  Clinical Data: Assess edema.  PORTABLE CHEST - 1 VIEW  Comparison: Plain film of the chest 12/04/2011 and 12/05/2011.  Findings: Endotracheal tube and NG tube  have been removed. Bibasilar airspace opacities persist.  Aeration is improved in the left lung base.  No pneumothorax is identified.  There is cardiomegaly.  IMPRESSION:  1.  Status post extubation and NG tube removal. 2.  Improved left basilar airspace disease with no change in the right base.  Original Report Authenticated By: Bernadene Bell. Maricela Curet, M.D.    Medications:  I have reviewed the patient's current medications. Scheduled:   . antiseptic oral rinse  15 mL Mouth Rinse QID  . cefTRIAXone (ROCEPHIN)  IV  1 g Intravenous Q24H  . heparin subcutaneous  5,000 Units Subcutaneous Q8H  . insulin aspart  0-15 Units Subcutaneous TID WC  . insulin aspart  0-5 Units Subcutaneous QHS  . insulin glargine  10 Units Subcutaneous Q24H  . mercaptopurine  50 mg Oral Daily  . methylPREDNISolone (SOLU-MEDROL) injection  40 mg Intravenous Q12H  . ondansetron (ZOFRAN) IV  4 mg Intravenous Once  . pantoprazole  40 mg Oral Q1200  . rifaximin  200 mg Oral TID  . DISCONTD: feeding supplement  30 mL Per Tube BID  . DISCONTD: lactulose  30 g Oral BID  . DISCONTD: lactulose  30 g Oral BID  . DISCONTD: lactulose  300 mL Rectal BID  . DISCONTD: pantoprazole sodium  40 mg Oral QHS    Assessment/Plan:  Principal Problem:  *Acute respiratory failure with hypoxia/Aspiration pneumonia *Stable *Was placed back on oxygen overnight but unclear why since saturations were in the mid 90s and no evidence of tachypnea *Consider weaning oxygen to off *Continue  Rocephin-white count continues to trend downward and no fever  Active Problems:  Acute renal failure *Creatinine 2.52 with a BUN of 74 presentation *As of today creatinine is now down to 0.89 with a BUN of 23 *Excellent urine output   Autoimmune hepatitis/Jaundice *Appreciate gastroenterology team assistance *Total bilirubin normal at presentation at 1.0 with elevated alkaline phosphatase 133 and elevated AST and ALT of 52 and 93 respectively *Patient had  been noncompliant with her medications prior to admission *Currently on IV Solu-Medrol but anticipate patient will be transitioned over to prednisone and 6 MP by gastroenterology  Hepatic encephalopathy *Patient refusing lactulose so we'll change to Xifaxan *Ammonia level today is 52 *If mental status does not improve in the next 24-48 hours consider other etiology to patient's encephalopathy-may need CT of the head to clarify  Thrombocytopenia *Likely related to patient's underlying hepatitis issues-stable   Diabetes mellitus/ DKA, type 2, not at goal *Presented with serum glucose greater than 400 *CBGs still greater than 200 especially now with patient advancing diet *We'll make adjustments in insulin dosing and sliding scale insulin   Hypernatremia *Sodium today is 144   HTN (hypertension) *Blood pressure now increasing to the hypertensive range again *Does not appear to have been on antihypertensive medications prior to admission *would benefit from beta blocker therapy but did have transient issues with bradycardia early this morning with rates in the 50s so at this point we'll continue to monitor and if heart rate remains stable would initiate beta blocker in a.m. *May benefit from 2-D echocardiogram this admission   Disposition *Transfer to 6700   LOS: 5 days   Junious Silk, ANP pager [phone]  [facility] [email or web address] Password: [email or web address]  12/07/2011, 11:13 AM  I have examined the patient and reviewed the chart. I agree with the above note.   Calvert Cantor, MD [phone]

## 2011-12-07 NOTE — Progress Notes (Signed)
[facility] PROGRESS NOTE Subjective Pt extubated and taking PO. Denies pain.  Objective: Vital signs in last 24 hours: Temp:  [97.2 F (36.2 C)] 97.2 F (36.2 C) (03/29 0740) Pulse Rate:  [56-94] 60  (03/29 0600) BP: (99-191)/(58-136) 135/78 mmHg (03/29 0600) SpO2:  [89 %-100 %] 100 % (03/29 0600) Weight:  [110.76 kg (244 lb 2.9 oz)] 110.76 kg (244 lb 2.9 oz) (03/29 0500) Last BM Date: 12/04/11  Intake/Output from previous day: 03/28 0701 - 03/29 0700 In: 2045 [P.O.:120; I.V.:1925] Out: 1820 [Urine:1820] Intake/Output this shift:    ZO:[id number] Abd-soft and nontender  Lab Results:  Basename 12/06/11 0500 12/05/11 0425  WBC 14.0* 20.3*  HGB 11.3* 10.9*  HCT 35.3* 33.8*  PLT 130* 133*   BMET  Basename 12/06/11 1743 12/06/11 0500 12/05/11 1800 12/05/11 0425 12/04/11 1800  NA 152* 155* 156* 158* 162*  K 3.7 4.3 4.2 4.0 4.3  CL 117* 122* 123* 125* 130*  CO2 28 28 27 27 27   CREATININE 0.92 0.97 1.20* 1.42* 1.66*   LFT  Basename 12/06/11 0500 12/05/11 0425  PROT 6.3 6.1  AST 47* 49*  ALT 71* 79*  ALKPHOS 107 105  BILITOT 1.2 1.1  BILIDIR -- --  IBILI -- --   PT/INR No results found for this basename: LABPROT:3,INR:3 in the last 72 hours PANCREAS No results found for this basename: LIPASE:3 in the last 72 hours       Studies/Results: Dg Chest Port 1 View  12/06/2011  *RADIOLOGY REPORT*  Clinical Data: Assess edema.  PORTABLE CHEST - 1 VIEW  Comparison: Plain film of the chest 12/04/2011 and 12/05/2011.  Findings: Endotracheal tube and NG tube have been removed. Bibasilar airspace opacities persist.  Aeration is improved in the left lung base.  No pneumothorax is identified.  There is cardiomegaly.  IMPRESSION:  1.  Status post extubation and NG tube removal. 2.  Improved left basilar airspace disease with no change in the right base.  Original Report Authenticated By: Bernadene Bell. Maricela Curet, M.D.    Medications: I have reviewed the patient's current  medications.  Assessment/Plan: 1. Autoimmune Hepatitis.  Pt on IV steroid for respiratory failure and hopefully can be weaned soon. LFTs near normal but could increase as steroids tapered.  Will resume 6-MP and follow Q2-3 days.   Dana Thornton,Dana Thornton L 12/07/2011, 9:11 AM

## 2011-12-07 NOTE — Progress Notes (Signed)
Nutrition Follow-up  Pt awake but very drowsy with multiple family members at bedside. Pt was just transferred from 2100 this morning and states she had some n/v from the move.  PO intake of breakfast this morning was 50% per pt husband report. Pt's husband states PO intake was <25% of dinner last night.   Diet Order:  Carb Modified Medium (1600-2000 kcal)  Meds: Scheduled Meds:   . antiseptic oral rinse  15 mL Mouth Rinse QID  . cefTRIAXone (ROCEPHIN)  IV  1 g Intravenous Q24H  . heparin subcutaneous  5,000 Units Subcutaneous Q8H  . insulin aspart  0-15 Units Subcutaneous TID WC  . insulin aspart  0-5 Units Subcutaneous QHS  . insulin glargine  10 Units Subcutaneous Q24H  . mercaptopurine  50 mg Oral Daily  . methylPREDNISolone (SOLU-MEDROL) injection  40 mg Intravenous Q12H  . ondansetron (ZOFRAN) IV  4 mg Intravenous Once  . pantoprazole  40 mg Oral Q1200  . rifaximin  200 mg Oral TID  . DISCONTD: feeding supplement  30 mL Per Tube BID  . DISCONTD: lactulose  30 g Oral BID  . DISCONTD: lactulose  30 g Oral BID  . DISCONTD: lactulose  300 mL Rectal BID  . DISCONTD: pantoprazole sodium  40 mg Oral QHS   Continuous Infusions:   . dextrose 100 mL/hr at 12/07/11 0600   PRN Meds:.sodium chloride, labetalol  Labs:  CMP     Component Value Date/Time   NA 144 12/07/2011 1008   K 4.0 12/07/2011 1008   CL 111 12/07/2011 1008   CO2 27 12/07/2011 1008   GLUCOSE 283* 12/07/2011 1008   BUN 23 12/07/2011 1008   CREATININE 0.89 12/07/2011 1008   CALCIUM 8.3* 12/07/2011 1008   PROT 6.5 12/07/2011 1008   ALBUMIN 2.4* 12/07/2011 1008   AST 41* 12/07/2011 1008   ALT 64* 12/07/2011 1008   ALKPHOS 108 12/07/2011 1008   BILITOT 1.3* 12/07/2011 1008   GFRNONAA 72* 12/07/2011 1008   GFRAA 83* 12/07/2011 1008  Phosphorus: 3.1 on 12/03/11 ( WNL) Magnesium: 2.8 on 12/03/11 (high) GFR continues to improve.  Intake/Output Summary (Last 24 hours) at 12/07/11 1115 Last data filed at 12/07/11 0900  Gross  per 24 hour  Intake   1900 ml  Output   1775 ml  Net    125 ml    Weight Status:  110.76 kg, trending up from 102.5 kg on 3/25  Re-estimated needs:  1600-1800 kcal and 75-90 grams protein  Nutrition Dx:  Inadequate oral intake now r/t poor appetite AEB PO intake 25-50% of meals.  Goal:  EN to provide 60-70% of estimated calorie needs based on ASPEN guidelines for permissive underfeeding of the critically ill. No longer Applicable. New Goal: PO intake to meet >/=90% of estimated needs.  Intervention:    Order orange shebert magic cup BID  Encourage PO intake as pt is able  Monitor:  PO intake, weight trend, labs  Karenann Cai Pager #:  [phone]  Kendell Bane Cornelison [phone]

## 2011-12-08 LAB — GLUCOSE, CAPILLARY
Glucose-Capillary: 344 mg/dL — ABNORMAL HIGH (ref 70–99)
Glucose-Capillary: 259 mg/dL — ABNORMAL HIGH (ref 70–99)
Glucose-Capillary: 197 mg/dL — ABNORMAL HIGH (ref 70–99)

## 2011-12-08 MED ORDER — INSULIN ASPART 100 UNIT/ML ~~LOC~~ SOLN
8.0000 [IU] | Freq: Three times a day (TID) | SUBCUTANEOUS | Status: DC
  Administered 2011-12-08 – 2011-12-09 (×4): 8 [IU] via SUBCUTANEOUS

## 2011-12-08 MED ORDER — MENTHOL 3 MG MT LOZG
1.0000 | LOZENGE | OROMUCOSAL | Status: DC | PRN
  Filled 2011-12-08 (×2): qty 9

## 2011-12-08 MED ORDER — INSULIN ASPART 100 UNIT/ML ~~LOC~~ SOLN: 0.0000 [IU] | Freq: Every day | SUBCUTANEOUS | Status: DC

## 2011-12-08 MED ORDER — INSULIN ASPART 100 UNIT/ML ~~LOC~~ SOLN
0.0000 [IU] | Freq: Three times a day (TID) | SUBCUTANEOUS | Status: DC
  Administered 2011-12-09: 15 [IU] via SUBCUTANEOUS

## 2011-12-08 MED ORDER — INSULIN GLARGINE 100 UNIT/ML ~~LOC~~ SOLN
25.0000 [IU] | Freq: Every day | SUBCUTANEOUS | Status: DC
  Administered 2011-12-08 – 2011-12-09 (×2): 25 [IU] via SUBCUTANEOUS

## 2011-12-08 NOTE — Progress Notes (Signed)
Dana Thornton CSN:[id number],MRN:[id number] is a 56 y.o. female,  Outpatient Primary MD for the patient is No primary provider on file.  Chief Complaint  Patient presents with  . Hyperglycemia        Subjective:    Dana Thornton today has, No headache, No chest pain, No abdominal pain - No Nausea, No new weakness tingling or numbness, No Cough - SOB.   Objective:   Filed Vitals:   12/07/11 1343 12/07/11 1709 12/07/11 2053 12/08/11 0519  BP: 107/60 133/96 144/65 153/74  Pulse: 89 68 57 53  Temp: 98 F (36.7 C) 98.2 F (36.8 C) 98.2 F (36.8 C) 97.7 F (36.5 C)  TempSrc: Oral Oral Oral Oral  Resp: 20 19 19 19   Height:      Weight:   111.812 kg (246 lb 8 oz)   SpO2: 100% 99% 96% 98%    Wt Readings from Last 3 Encounters:  12/07/11 111.812 kg (246 lb 8 oz)     Intake/Output Summary (Last 24 hours) at 12/08/11 0924 Last data filed at 12/07/11 1842  Gross per 24 hour  Intake    150 ml  Output    650 ml  Net   -500 ml    Exam Awake Alert, Oriented *3, No new F.N deficits, Normal affect [place].AT,PERRAL Supple Neck,No JVD, No cervical lymphadenopathy appriciated.  Symmetrical Chest wall movement, Good air movement bilaterally, CTAB RRR,No Gallops,Rubs or new Murmurs, No Parasternal Heave +ve B.Sounds, Abd Soft, Non tender, No organomegaly appriciated, No rebound -guarding or rigidity. No Cyanosis, Clubbing or edema, No new Rash or bruise     Data Review  CBC  Lab 12/07/11 1008 12/06/11 0500 12/05/11 0425 12/04/11 0540 12/03/11 0202 12/02/11 2211  WBC 12.6* 14.0* 20.3* 21.7* -- 25.4*  HGB 11.7* 11.3* 10.9* 11.7* 13.3 --  HCT 35.5* 35.3* 33.8* 36.2 39.0 --  PLT 125* 130* 133* 164 -- 297  MCV 86.4 87.8 88.9 89.8 -- 90.3  MCH 28.5 28.1 28.7 29.0 -- 29.0  MCHC 33.0 32.0 32.2 32.3 -- 32.1  RDW 17.8* 18.4* 18.9* 18.6* -- 18.0*  LYMPHSABS -- 1.9 1.3 1.5 -- 2.3  MONOABS -- 0.6 0.6 0.3 -- 0.5  EOSABS -- 0.0 0.0 0.0 -- 0.0  BASOSABS -- 0.0 0.0 0.0 -- 0.0  BANDABS -- --  -- -- -- --    Chemistries   Lab 12/07/11 1714 12/07/11 1008 12/06/11 1743 12/06/11 0500 12/05/11 1800 12/05/11 0425 12/04/11 0540 12/03/11 0542  NA 143 144 152* 155* 156* -- -- --  K 4.0 4.0 3.7 4.3 4.2 -- -- --  CL 110 111 117* 122* 123* -- -- --  CO2 27 27 28 28 27  -- -- --  GLUCOSE 222* 283* 162* 225* 224* -- -- --  BUN 21 23 30* 35* 41* -- -- --  CREATININE 0.93 0.89 0.92 0.97 1.20* -- -- --  CALCIUM 8.5 8.3* 8.3* 8.3* 8.2* -- -- --  MG -- -- -- -- -- -- -- 2.8*  AST -- 41* -- 47* -- 49* 52* 42*  ALT -- 64* -- 71* -- 79* 93* 122*  ALKPHOS -- 108 -- 107 -- 105 133* 145*  BILITOT -- 1.3* -- 1.2 -- 1.1 1.2 1.5*   ------------------------------------------------------------------------------------------------------------------ estimated creatinine clearance is 80.7 ml/min (by C-G formula based on Cr of 0.93). ------------------------------------------------------------------------------------------------------------------ No results found for this basename: HGBA1C:2 in the last 72 hours ------------------------------------------------------------------------------------------------------------------ No results found for this basename: CHOL:2,HDL:2,LDLCALC:2,TRIG:2,CHOLHDL:2,LDLDIRECT:2 in the last 72 hours ------------------------------------------------------------------------------------------------------------------ No results found  for this basename: TSH,T4TOTAL,FREET3,T3FREE,THYROIDAB in the last 72 hours ------------------------------------------------------------------------------------------------------------------ No results found for this basename: VITAMINB12:2,FOLATE:2,FERRITIN:2,TIBC:2,IRON:2,RETICCTPCT:2 in the last 72 hours  Coagulation profile  Lab 12/03/11 0542  INR 1.26  PROTIME --    No results found for this basename: DDIMER:2 in the last 72 hours  Cardiac Enzymes  Lab 12/02/11 2229  CKMB 2.7  TROPONINI <0.30  MYOGLOBIN --    ------------------------------------------------------------------------------------------------------------------ No components found with this basename: POCBNP:3  Micro Results Recent Results (from the past 240 hour(s))  URINE CULTURE     Status: Normal   Collection Time   12/02/11 10:22 PM      Component Value Range Status Comment   Specimen Description URINE, CATHETERIZED   Final    Special Requests NONE   Final    Culture  Setup Time [id number]   Final    Colony Count NO GROWTH   Final    Culture NO GROWTH   Final    Report Status 12/04/2011 FINAL   Final   CULTURE, BLOOD (ROUTINE X 2)     Status: Normal (Preliminary result)   Collection Time   12/02/11 10:45 PM      Component Value Range Status Comment   Specimen Description BLOOD RIGHT HAND   Final    Special Requests BOTTLES DRAWN AEROBIC ONLY 2CC   Final    Culture  Setup Time [id number]   Final    Culture     Final    Value:        BLOOD CULTURE RECEIVED NO GROWTH TO DATE CULTURE WILL BE HELD FOR 5 DAYS BEFORE ISSUING A FINAL NEGATIVE REPORT   Report Status PENDING   Incomplete   MRSA PCR SCREENING     Status: Normal   Collection Time   12/03/11  2:56 AM      Component Value Range Status Comment   MRSA by PCR NEGATIVE  NEGATIVE  Final   CULTURE, RESPIRATORY     Status: Normal   Collection Time   12/03/11 12:17 PM      Component Value Range Status Comment   Specimen Description ENDOTRACHEAL ASPIRATE   Final    Special Requests NONE   Final    Gram Stain     Final    Value: NO WBC SEEN     FEW SQUAMOUS EPITHELIAL CELLS PRESENT     RARE GRAM POSITIVE COCCI IN PAIRS   Culture Non-Pathogenic Oropharyngeal-type [place] Isolated.   Final    Report Status 12/05/2011 FINAL   Final     Radiology Reports Ct Abdomen Pelvis Wo Contrast  12/03/2011  *RADIOLOGY REPORT*  Clinical Data: Abdominal pain; elevated LFTs.  Nausea, vomiting and jaundice.  CT ABDOMEN AND PELVIS WITHOUT CONTRAST  Technique:  Multidetector CT imaging  of the abdomen and pelvis was performed following the standard protocol without intravenous contrast.  Comparison: CT of the abdomen and pelvis performed 07/01/2007, and abdominal ultrasound performed 09/27/2008  Findings: Bilateral lower lobe airspace consolidation raises concern for pneumonia, given associated air bronchograms.  The liver and spleen are unremarkable in appearance.  Vaguely increased attenuation within the gallbladder may reflect sludge; no definite stones are identified.  There is no evidence for obstruction or cholecystitis.  The pancreas and adrenal glands are unremarkable.  The kidneys are unremarkable in appearance.  There is no evidence of hydronephrosis.  No renal or ureteral stones are seen.  No perinephric stranding is appreciated.  No free fluid is identified.  The small bowel is unremarkable  in appearance.  The stomach is within normal limits.  No acute vascular abnormalities are seen.  Scattered calcification is noted along the abdominal aorta and its branches.  The appendix is normal in caliber, extending about the midline, without evidence for appendicitis.  Mild scattered diverticulosis is noted along the descending and proximal sigmoid colon, without definite evidence of diverticulitis.  The colon is otherwise unremarkable in appearance.  The bladder is decompressed, with a Foley catheter in place.  A small amount of air is noted within the bladder.  The uterus is grossly unremarkable in appearance.  The ovaries are relatively symmetric; no suspicious adnexal masses are seen.  No inguinal lymphadenopathy is seen.  No acute osseous abnormalities are identified.  IMPRESSION:  1.  Bilateral lower lobe airspace opacification raises concern for pneumonia, given associated air bronchograms. 2.  Mild scattered diverticulosis along the descending proximal sigmoid colon, without definite evidence of diverticulitis. 3.  Vaguely increased attenuation within the gallbladder raises concern for  sludge; no definite stones identified within the gallbladder.  No evidence for obstruction or cholecystitis.  Original Report Authenticated By: Tonia Ghent, M.D.   Ct Head Wo Contrast  12/02/2011  *RADIOLOGY REPORT*  Clinical Data: 56 year old female with altered mental status.  CT HEAD WITHOUT CONTRAST  Technique:  Contiguous axial images were obtained from the base of the skull through the vertex without contrast.  Comparison: None  Findings: No acute intracranial abnormalities are identified, including mass lesion or mass effect, hydrocephalus, extra-axial fluid collection, midline shift, hemorrhage, or acute infarction.  The visualized bony calvarium is unremarkable.  IMPRESSION: No evidence of acute intracranial abnormality.  Original Report Authenticated By: Rosendo Gros, M.D.   Dg Chest Port 1 View  12/06/2011  *RADIOLOGY REPORT*  Clinical Data: Assess edema.  PORTABLE CHEST - 1 VIEW  Comparison: Plain film of the chest 12/04/2011 and 12/05/2011.  Findings: Endotracheal tube and NG tube have been removed. Bibasilar airspace opacities persist.  Aeration is improved in the left lung base.  No pneumothorax is identified.  There is cardiomegaly.  IMPRESSION:  1.  Status post extubation and NG tube removal. 2.  Improved left basilar airspace disease with no change in the right base.  Original Report Authenticated By: Bernadene Bell. Maricela Curet, M.D.   Dg Chest Port 1 View  12/05/2011  *RADIOLOGY REPORT*  Clinical Data: Follow up pulmonary edema  PORTABLE CHEST - 1 VIEW  Comparison: 12/04/2011  Findings: Cardiomediastinal silhouette is stable.  Stable endotracheal and NG tube position.  Stable right subclavian central line position with tip in right atrium.  Central mild vascular congestion without convincing pulmonary edema.  Persistent bilateral basilar atelectasis or infiltrate.  Question small left pleural effusion.  IMPRESSION: Stable endotracheal and NG tube position.  Stable right subclavian central  line position with tip in right atrium.  Central mild vascular congestion without convincing pulmonary edema.  Persistent bilateral basilar atelectasis or infiltrate.  Question small left pleural effusion.  Original Report Authenticated By: Natasha Mead, M.D.   Dg Chest Port 1 View  12/04/2011  *RADIOLOGY REPORT*  Clinical Data: Respiratory failure  PORTABLE CHEST - 1 VIEW  Comparison: 12/03/2011  Findings: Endotracheal tube remains present with the tip approximately 4 cm above the carina.  Interval nasogastric tube with tip in stomach.  Central line positioning stable with tip in the right atrium.  Bilateral lower lobe atelectasis present which is slightly more prominent compared to the prior chest x-ray.  No overt pulmonary edema or significant pleural effusions  identified.  IMPRESSION: Increased bilateral lower lobe atelectasis.  Original Report Authenticated By: Reola Calkins, M.D.   Dg Chest Port 1 View  12/03/2011  *RADIOLOGY REPORT*  Clinical Data: Endotracheal tube placement.  PORTABLE CHEST - 1 VIEW  Comparison: Chest radiograph performed earlier today at 01:23 a.m.  Findings: The patient's endotracheal tube is seen ending 3 cm above the carina.  The patient's right IJ line is noted ending deep within the right atrium; this should be retracted 7 cm.  The lungs remain hypoexpanded.  Vascular congestion is noted, with mild bibasilar airspace opacity, raising concern for mild interstitial edema.  No pleural effusion or pneumothorax is seen.  The cardiomediastinal silhouette is borderline enlarged.  No acute osseous abnormalities identified.  IMPRESSION:  1.  Endotracheal tube seen ending 3 cm above the carina. 2.  Right IJ line noted ending deep within the right atrium; this should be retracted 7 cm. 3.  Lungs hypoexpanded; vascular congestion and borderline cardiomegaly, with mild bibasilar airspace opacities, raising concern for mild interstitial edema.  These results were called by telephone on  12/03/2011  at  04:17 a.m. to  Nursing on [id number], who verbally acknowledged these results.  Original Report Authenticated By: Tonia Ghent, M.D.   Dg Chest Portable 1 View  12/03/2011  *RADIOLOGY REPORT*  Clinical Data: Central line placement; hyperglycemia.  PORTABLE CHEST - 1 VIEW  Comparison: Chest radiograph performed 12/02/2011  Findings: The patient's right subclavian line is noted ending deep within the right atrium; this should be retracted at least 4 cm.  The lungs are hypoexpanded.  Mild vascular congestion and vascular crowding are noted.  No definite interstitial edema is seen.  No focal consolidation, pleural effusion or pneumothorax is identified.  The cardiomediastinal silhouette is borderline normal in size.  No acute osseous abnormalities are identified.  IMPRESSION:  1.  Right subclavian line noted ending deep within the right atrium; this should be retracted at least 4 cm. 2.  Lungs hypoexpanded, with mild vascular congestion.  No focal consolidation seen.  These results were called by telephone on 12/03/2011  at  01:38 a.m. to  Dr. Glynn Octave, who verbally acknowledged these results.  Original Report Authenticated By: Tonia Ghent, M.D.   Dg Chest Port 1 View  12/02/2011  *RADIOLOGY REPORT*  Clinical Data: 56 year old female with altered mental status.  PORTABLE CHEST - 1 VIEW  Comparison: None  Findings: Upper limits normal heart size and mild pulmonary vascular congestion noted. There is no evidence of focal airspace disease, pulmonary edema, suspicious pulmonary nodule/mass, pleural effusion, or pneumothorax. No acute bony abnormalities are identified.  IMPRESSION: Upper limits normal heart size and mild pulmonary vascular congestion.  Original Report Authenticated By: Rosendo Gros, M.D.    Scheduled Meds:   . antiseptic oral rinse  15 mL Mouth Rinse QID  . bd getting started take home kit  1 kit Other Once  . cefTRIAXone (ROCEPHIN)  IV  1 g Intravenous Q24H  . heparin  subcutaneous  5,000 Units Subcutaneous Q8H  . insulin aspart  0-20 Units Subcutaneous TID WC  . insulin aspart  0-5 Units Subcutaneous QHS  . insulin aspart  6 Units Subcutaneous TID WC  . insulin glargine  20 Units Subcutaneous QHS  . mercaptopurine  50 mg Oral Daily  . methylPREDNISolone (SOLU-MEDROL) injection  40 mg Intravenous Q12H  . pantoprazole  40 mg Oral Q1200  . rifaximin  200 mg Oral TID  . DISCONTD: insulin aspart  0-15 Units Subcutaneous  TID WC  . DISCONTD: insulin aspart  0-5 Units Subcutaneous QHS  . DISCONTD: insulin glargine  10 Units Subcutaneous Q24H  . DISCONTD: lactulose  30 g Oral BID  . DISCONTD: lactulose  30 g Oral BID  . DISCONTD: lactulose  300 mL Rectal BID   Continuous Infusions:   . DISCONTD: dextrose 100 mL/hr at 12/07/11 0600   PRN Meds:.labetalol, menthol-cetylpyridinium, DISCONTD: sodium chloride  Assessment & Plan   Taking over patient 12-08-11 on day 6 of admission  Patient admitted with DKA - PNA - Resp Failure- ARF(pre renal) - Has Auto Immune Hepatitis - was intubated and extubated, in ICU for few days, now getting better, finishing ABX, DKA and ARF resolved, GI following for Steroid dosing.   Acute respiratory failure with hypoxia/Aspiration pneumonia   Stable, not on oxygen, feels fine, afebrile, WBC trending lower, Rocephin to stop in am.     Acute renal failure  Due to preRenal azotemia- resolved post IVF   Autoimmune hepatitis/Jaundice  Trend improving *Patient had been noncompliant with her medications prior to admission  *Currently on IV Solu-Medrol but anticipate patient will be transitioned over to prednisone and 6 MP by gastroenterology    Hepatic encephalopathy  *Patient refusing lactulose so we'll continue Xifaxan  CT brain on 24th -ve, clinically much better, increase activity, DC Foley, PT.   Thrombocytopenia  *Likely related to patient's underlying hepatitis issues, monitor   Diabetes mellitus/ DKA, type 2,  not at goal  Worse with steroids, Lantus-ISS all increased   Hypernatremia  Resolved    HTN (hypertension)  Stable. Continue present Rx.      DVT Prophylaxis Heparin      Leroy Sea M.D on 12/08/2011 at 9:24 AM  [facility] Office  [phone]

## 2011-12-09 LAB — BASIC METABOLIC PANEL
BUN: 18 mg/dL (ref 6–23)
CO2: 26 mEq/L (ref 19–32)
Calcium: 8.7 mg/dL (ref 8.4–10.5)
Chloride: 107 mEq/L (ref 96–112)
Creatinine, Ser: 0.86 mg/dL (ref 0.50–1.10)
GFR calc Af Amer: 87 mL/min — ABNORMAL LOW (ref >90)
GFR calc non Af Amer: 75 mL/min — ABNORMAL LOW (ref >90)
Glucose, Bld: 156 mg/dL — ABNORMAL HIGH (ref 70–99)
Potassium: 4 mEq/L (ref 3.5–5.1)
Sodium: 141 mEq/L (ref 135–145)

## 2011-12-09 LAB — HEPATIC FUNCTION PANEL
ALT: 48 U/L — ABNORMAL HIGH (ref 0–35)
AST: 27 U/L (ref 0–37)
Alkaline Phosphatase: 103 U/L (ref 39–117)
Bilirubin, Direct: 0.4 mg/dL — ABNORMAL HIGH (ref 0.0–0.3)
Total Bilirubin: 1.2 mg/dL (ref 0.3–1.2)

## 2011-12-09 LAB — GLUCOSE, CAPILLARY
Glucose-Capillary: 139 mg/dL — ABNORMAL HIGH (ref 70–99)
Glucose-Capillary: 73 mg/dL (ref 70–99)

## 2011-12-09 LAB — CBC
MCH: 28 pg (ref 26.0–34.0)
MCHC: 32.8 g/dL (ref 30.0–36.0)
Platelets: 153 10*3/uL (ref 150–400)
RDW: 17.6 % — ABNORMAL HIGH (ref 11.5–15.5)

## 2011-12-09 LAB — CULTURE, BLOOD (ROUTINE X 2)

## 2011-12-09 MED ORDER — AMLODIPINE BESYLATE 5 MG PO TABS
5.0000 mg | ORAL_TABLET | Freq: Every day | ORAL | Status: DC
  Administered 2011-12-09: 5 mg via ORAL
  Filled 2011-12-09 (×2): qty 1

## 2011-12-09 MED ORDER — LACTULOSE 10 GM/15ML PO SOLN
30.0000 g | Freq: Two times a day (BID) | ORAL | Status: DC
  Filled 2011-12-09 (×2): qty 45

## 2011-12-09 MED ORDER — CARVEDILOL 3.125 MG PO TABS
3.1250 mg | ORAL_TABLET | Freq: Two times a day (BID) | ORAL | Status: DC
  Administered 2011-12-09 – 2011-12-10 (×3): 3.125 mg via ORAL
  Filled 2011-12-09 (×5): qty 1

## 2011-12-09 NOTE — Progress Notes (Signed)
Dana Thornton CSN:[id number],MRN:[id number] is a 56 y.o. female,  Outpatient Primary MD for the patient is No primary provider on file.  Chief Complaint  Patient presents with  . Hyperglycemia        Subjective:    Dana Thornton today has, No headache, No chest pain, No abdominal pain - No Nausea, No new weakness tingling or numbness, No Cough - SOB.   Objective:   Filed Vitals:   12/08/11 1800 12/08/11 2131 12/09/11 0543 12/09/11 0942  BP: 154/74 178/69 178/83 167/93  Pulse: 103 60 63 70  Temp: 98.6 F (37 C) 97.9 F (36.6 C) 97.6 F (36.4 C) 97.4 F (36.3 C)  TempSrc: Oral Oral Oral Oral  Resp: 18 18 18 19   Height:      Weight:  110.9 kg (244 lb 7.8 oz)    SpO2: 97% 100% 99% 100%    Wt Readings from Last 3 Encounters:  12/08/11 110.9 kg (244 lb 7.8 oz)     Intake/Output Summary (Last 24 hours) at 12/09/11 0955 Last data filed at 12/09/11 0900  Gross per 24 hour  Intake    840 ml  Output    375 ml  Net    465 ml    Exam Awake Alert, Oriented *3, No new F.N deficits, Normal affect [place].AT,PERRAL Supple Neck,No JVD, No cervical lymphadenopathy appriciated.  Symmetrical Chest wall movement, Good air movement bilaterally, CTAB RRR,No Gallops,Rubs or new Murmurs, No Parasternal Heave +ve B.Sounds, Abd Soft, Non tender, No organomegaly appriciated, No rebound -guarding or rigidity. No Cyanosis, Clubbing or edema, No new Rash or bruise     Data Review  CBC  Lab 12/09/11 0618 12/07/11 1008 12/06/11 0500 12/05/11 0425 12/04/11 0540 12/02/11 2211  WBC 15.1* 12.6* 14.0* 20.3* 21.7* --  HGB 11.8* 11.7* 11.3* 10.9* 11.7* --  HCT 36.0 35.5* 35.3* 33.8* 36.2 --  PLT 153 125* 130* 133* 164 --  MCV 85.3 86.4 87.8 88.9 89.8 --  MCH 28.0 28.5 28.1 28.7 29.0 --  MCHC 32.8 33.0 32.0 32.2 32.3 --  RDW 17.6* 17.8* 18.4* 18.9* 18.6* --  LYMPHSABS -- -- 1.9 1.3 1.5 2.3  MONOABS -- -- 0.6 0.6 0.3 0.5  EOSABS -- -- 0.0 0.0 0.0 0.0  BASOSABS -- -- 0.0 0.0 0.0 0.0  BANDABS --  -- -- -- -- --    Chemistries   Lab 12/09/11 0618 12/07/11 1714 12/07/11 1008 12/06/11 1743 12/06/11 0500 12/05/11 0425 12/04/11 0540 12/03/11 0542  NA 141 143 144 152* 155* -- -- --  K 4.0 4.0 4.0 3.7 4.3 -- -- --  CL 107 110 111 117* 122* -- -- --  CO2 26 27 27 28 28  -- -- --  GLUCOSE 156* 222* 283* 162* 225* -- -- --  BUN 18 21 23  30* 35* -- -- --  CREATININE 0.86 0.93 0.89 0.92 0.97 -- -- --  CALCIUM 8.7 8.5 8.3* 8.3* 8.3* -- -- --  MG -- -- -- -- -- -- -- 2.8*  AST 27 -- 41* -- 47* 49* 52* --  ALT 48* -- 64* -- 71* 79* 93* --  ALKPHOS 103 -- 108 -- 107 105 133* --  BILITOT 1.2 -- 1.3* -- 1.2 1.1 1.2 --   ------------------------------------------------------------------------------------------------------------------ estimated creatinine clearance is 86.8 ml/min (by C-G formula based on Cr of 0.86). ------------------------------------------------------------------------------------------------------------------ No results found for this basename: HGBA1C:2 in the last 72 hours ------------------------------------------------------------------------------------------------------------------ No results found for this basename: CHOL:2,HDL:2,LDLCALC:2,TRIG:2,CHOLHDL:2,LDLDIRECT:2 in the last 72 hours ------------------------------------------------------------------------------------------------------------------ No results  found for this basename: TSH,T4TOTAL,FREET3,T3FREE,THYROIDAB in the last 72 hours ------------------------------------------------------------------------------------------------------------------ No results found for this basename: VITAMINB12:2,FOLATE:2,FERRITIN:2,TIBC:2,IRON:2,RETICCTPCT:2 in the last 72 hours  Coagulation profile  Lab 12/03/11 0542  INR 1.26  PROTIME --    No results found for this basename: DDIMER:2 in the last 72 hours  Cardiac Enzymes  Lab 12/02/11 2229  CKMB 2.7  TROPONINI <0.30  MYOGLOBIN --    ------------------------------------------------------------------------------------------------------------------ No components found with this basename: POCBNP:3  Micro Results Recent Results (from the past 240 hour(s))  URINE CULTURE     Status: Normal   Collection Time   12/02/11 10:22 PM      Component Value Range Status Comment   Specimen Description URINE, CATHETERIZED   Final    Special Requests NONE   Final    Culture  Setup Time [id number]   Final    Colony Count NO GROWTH   Final    Culture NO GROWTH   Final    Report Status 12/04/2011 FINAL   Final   CULTURE, BLOOD (ROUTINE X 2)     Status: Normal   Collection Time   12/02/11 10:45 PM      Component Value Range Status Comment   Specimen Description BLOOD RIGHT HAND   Final    Special Requests BOTTLES DRAWN AEROBIC ONLY [facility]   Final    Culture  Setup Time [id number]   Final    Culture NO GROWTH 5 DAYS   Final    Report Status 12/09/2011 FINAL   Final   MRSA PCR SCREENING     Status: Normal   Collection Time   12/03/11  2:56 AM      Component Value Range Status Comment   MRSA by PCR NEGATIVE  NEGATIVE  Final   CULTURE, RESPIRATORY     Status: Normal   Collection Time   12/03/11 12:17 PM      Component Value Range Status Comment   Specimen Description ENDOTRACHEAL ASPIRATE   Final    Special Requests NONE   Final    Gram Stain     Final    Value: NO WBC SEEN     FEW SQUAMOUS EPITHELIAL CELLS PRESENT     RARE GRAM POSITIVE COCCI IN PAIRS   Culture Non-Pathogenic Oropharyngeal-type [place] Isolated.   Final    Report Status 12/05/2011 FINAL   Final     Radiology Reports Ct Abdomen Pelvis Wo Contrast  12/03/2011  *RADIOLOGY REPORT*  Clinical Data: Abdominal pain; elevated LFTs.  Nausea, vomiting and jaundice.  CT ABDOMEN AND PELVIS WITHOUT CONTRAST  Technique:  Multidetector CT imaging of the abdomen and pelvis was performed following the standard protocol without intravenous contrast.  Comparison: CT of the  abdomen and pelvis performed 07/01/2007, and abdominal ultrasound performed 09/27/2008  Findings: Bilateral lower lobe airspace consolidation raises concern for pneumonia, given associated air bronchograms.  The liver and spleen are unremarkable in appearance.  Vaguely increased attenuation within the gallbladder may reflect sludge; no definite stones are identified.  There is no evidence for obstruction or cholecystitis.  The pancreas and adrenal glands are unremarkable.  The kidneys are unremarkable in appearance.  There is no evidence of hydronephrosis.  No renal or ureteral stones are seen.  No perinephric stranding is appreciated.  No free fluid is identified.  The small bowel is unremarkable in appearance.  The stomach is within normal limits.  No acute vascular abnormalities are seen.  Scattered calcification is noted along the abdominal aorta and its branches.  The appendix is normal in caliber, extending about the midline, without evidence for appendicitis.  Mild scattered diverticulosis is noted along the descending and proximal sigmoid colon, without definite evidence of diverticulitis.  The colon is otherwise unremarkable in appearance.  The bladder is decompressed, with a Foley catheter in place.  A small amount of air is noted within the bladder.  The uterus is grossly unremarkable in appearance.  The ovaries are relatively symmetric; no suspicious adnexal masses are seen.  No inguinal lymphadenopathy is seen.  No acute osseous abnormalities are identified.  IMPRESSION:  1.  Bilateral lower lobe airspace opacification raises concern for pneumonia, given associated air bronchograms. 2.  Mild scattered diverticulosis along the descending proximal sigmoid colon, without definite evidence of diverticulitis. 3.  Vaguely increased attenuation within the gallbladder raises concern for sludge; no definite stones identified within the gallbladder.  No evidence for obstruction or cholecystitis.  Original Report  Authenticated By: Tonia Ghent, M.D.   Ct Head Wo Contrast  12/02/2011  *RADIOLOGY REPORT*  Clinical Data: 56 year old female with altered mental status.  CT HEAD WITHOUT CONTRAST  Technique:  Contiguous axial images were obtained from the base of the skull through the vertex without contrast.  Comparison: None  Findings: No acute intracranial abnormalities are identified, including mass lesion or mass effect, hydrocephalus, extra-axial fluid collection, midline shift, hemorrhage, or acute infarction.  The visualized bony calvarium is unremarkable.  IMPRESSION: No evidence of acute intracranial abnormality.  Original Report Authenticated By: Rosendo Gros, M.D.   Dg Chest Port 1 View  12/06/2011  *RADIOLOGY REPORT*  Clinical Data: Assess edema.  PORTABLE CHEST - 1 VIEW  Comparison: Plain film of the chest 12/04/2011 and 12/05/2011.  Findings: Endotracheal tube and NG tube have been removed. Bibasilar airspace opacities persist.  Aeration is improved in the left lung base.  No pneumothorax is identified.  There is cardiomegaly.  IMPRESSION:  1.  Status post extubation and NG tube removal. 2.  Improved left basilar airspace disease with no change in the right base.  Original Report Authenticated By: Bernadene Bell. Maricela Curet, M.D.   Dg Chest Port 1 View  12/05/2011  *RADIOLOGY REPORT*  Clinical Data: Follow up pulmonary edema  PORTABLE CHEST - 1 VIEW  Comparison: 12/04/2011  Findings: Cardiomediastinal silhouette is stable.  Stable endotracheal and NG tube position.  Stable right subclavian central line position with tip in right atrium.  Central mild vascular congestion without convincing pulmonary edema.  Persistent bilateral basilar atelectasis or infiltrate.  Question small left pleural effusion.  IMPRESSION: Stable endotracheal and NG tube position.  Stable right subclavian central line position with tip in right atrium.  Central mild vascular congestion without convincing pulmonary edema.  Persistent  bilateral basilar atelectasis or infiltrate.  Question small left pleural effusion.  Original Report Authenticated By: Natasha Mead, M.D.   Dg Chest Port 1 View  12/04/2011  *RADIOLOGY REPORT*  Clinical Data: Respiratory failure  PORTABLE CHEST - 1 VIEW  Comparison: 12/03/2011  Findings: Endotracheal tube remains present with the tip approximately 4 cm above the carina.  Interval nasogastric tube with tip in stomach.  Central line positioning stable with tip in the right atrium.  Bilateral lower lobe atelectasis present which is slightly more prominent compared to the prior chest x-ray.  No overt pulmonary edema or significant pleural effusions identified.  IMPRESSION: Increased bilateral lower lobe atelectasis.  Original Report Authenticated By: Reola Calkins, M.D.   Dg Chest Port 1 View  12/03/2011  *RADIOLOGY REPORT*  Clinical Data: Endotracheal tube placement.  PORTABLE CHEST - 1 VIEW  Comparison: Chest radiograph performed earlier today at 01:23 a.m.  Findings: The patient's endotracheal tube is seen ending 3 cm above the carina.  The patient's right IJ line is noted ending deep within the right atrium; this should be retracted 7 cm.  The lungs remain hypoexpanded.  Vascular congestion is noted, with mild bibasilar airspace opacity, raising concern for mild interstitial edema.  No pleural effusion or pneumothorax is seen.  The cardiomediastinal silhouette is borderline enlarged.  No acute osseous abnormalities identified.  IMPRESSION:  1.  Endotracheal tube seen ending 3 cm above the carina. 2.  Right IJ line noted ending deep within the right atrium; this should be retracted 7 cm. 3.  Lungs hypoexpanded; vascular congestion and borderline cardiomegaly, with mild bibasilar airspace opacities, raising concern for mild interstitial edema.  These results were called by telephone on 12/03/2011  at  04:17 a.m. to  Nursing on [id number], who verbally acknowledged these results.  Original Report Authenticated By:  Tonia Ghent, M.D.   Dg Chest Portable 1 View  12/03/2011  *RADIOLOGY REPORT*  Clinical Data: Central line placement; hyperglycemia.  PORTABLE CHEST - 1 VIEW  Comparison: Chest radiograph performed 12/02/2011  Findings: The patient's right subclavian line is noted ending deep within the right atrium; this should be retracted at least 4 cm.  The lungs are hypoexpanded.  Mild vascular congestion and vascular crowding are noted.  No definite interstitial edema is seen.  No focal consolidation, pleural effusion or pneumothorax is identified.  The cardiomediastinal silhouette is borderline normal in size.  No acute osseous abnormalities are identified.  IMPRESSION:  1.  Right subclavian line noted ending deep within the right atrium; this should be retracted at least 4 cm. 2.  Lungs hypoexpanded, with mild vascular congestion.  No focal consolidation seen.  These results were called by telephone on 12/03/2011  at  01:38 a.m. to  Dr. Glynn Octave, who verbally acknowledged these results.  Original Report Authenticated By: Tonia Ghent, M.D.   Dg Chest Port 1 View  12/02/2011  *RADIOLOGY REPORT*  Clinical Data: 56 year old female with altered mental status.  PORTABLE CHEST - 1 VIEW  Comparison: None  Findings: Upper limits normal heart size and mild pulmonary vascular congestion noted. There is no evidence of focal airspace disease, pulmonary edema, suspicious pulmonary nodule/mass, pleural effusion, or pneumothorax. No acute bony abnormalities are identified.  IMPRESSION: Upper limits normal heart size and mild pulmonary vascular congestion.  Original Report Authenticated By: Rosendo Gros, M.D.    Scheduled Meds:    . amLODipine  5 mg Oral Daily  . antiseptic oral rinse  15 mL Mouth Rinse QID  . bd getting started take home kit  1 kit Other Once  . carvedilol  3.125 mg Oral BID WC  . cefTRIAXone (ROCEPHIN)  IV  1 g Intravenous Q24H  . insulin aspart  0-20 Units Subcutaneous TID WC  . insulin aspart   0-20 Units Subcutaneous TID WC  . insulin aspart  0-5 Units Subcutaneous QHS  . insulin aspart  8 Units Subcutaneous TID WC  . insulin glargine  25 Units Subcutaneous QHS  . mercaptopurine  50 mg Oral Daily  . methylPREDNISolone (SOLU-MEDROL) injection  40 mg Intravenous Q12H  . pantoprazole  40 mg Oral Q1200  . DISCONTD: heparin subcutaneous  5,000 Units Subcutaneous Q8H  . DISCONTD: lactulose  30 g Oral BID  . DISCONTD: rifaximin  200 mg Oral TID  Continuous Infusions:  PRN Meds:.labetalol, menthol-cetylpyridinium  Assessment & Plan   Taking over patient 12-08-11 on day 6 of admission  Patient admitted with DKA - PNA - Resp Failure- ARF(pre renal) - Has Auto Immune Hepatitis - was intubated and extubated, in ICU for few days, now getting better, finishing ABX, DKA and ARF resolved, GI following for Steroid dosing for autoimmune hepatitis.     Acute respiratory failure with hypoxia/Aspiration pneumonia   Stable, not on oxygen, feels fine, afebrile, WBC slightly elevated likely due to steroid use, Rocephin completed course, clinically appears fine and feels good she is not on oxygen no cough no shortness of breath.     Acute renal failure  Due to preRenal azotemia- resolved post IVF    Autoimmune hepatitis/Jaundice  Trend improving, Patient had been noncompliant with her medications prior to admission, Currently on IV Solu-Medrol but anticipate patient will be transitioned over to prednisone and 6 MP by gastroenterology I have discussed this with Dr. Randa Evens on 12/09/2011 and he will put his recommendations today, per him for Xifaxan and lactulose but can be stopped as liver functions are normal.    Hepatic encephalopathy   CT brain negative on 21st of March 2013, agents hepatic encephalopathy is completely resolved now     Thrombocytopenia  Likely related to patient's underlying hepatitis issues, trend improving have stopped heparin use and replaced it with  SCDs.    Diabetes mellitus/ DKA, type 2, not at goal  Worse with steroids, Lantus-ISS all increased, better control now.   CBG (last 3)   Basename 12/09/11 0748 12/08/11 2128 12/08/11 1745  GLUCAP 139* 197* 259*     Hypernatremia  Resolved     HTN (hypertension)   Medications have been adjusted have added Coreg and Norvasc for better blood pressure control as trend is high.      DVT Prophylaxis Heparin      Leroy Sea M.D on 12/09/2011 at 9:55 AM  [facility] Office  [phone]

## 2011-12-09 NOTE — Progress Notes (Signed)
[facility] PROGRESS NOTE Subjective Patient feeling quite well is alert no particular symptoms. She is on 6-MP 50 mg and IV steroids. She is doing much better and is due to be sent home soon.  Objective: Vital signs in last 24 hours: Temp:  [97.4 F (36.3 C)-98.6 F (37 C)] 97.4 F (36.3 C) (03/31 0942) Pulse Rate:  [60-103] 70  (03/31 0942) Resp:  [18-19] 19  (03/31 0942) BP: (154-178)/(69-93) 167/93 mmHg (03/31 0942) SpO2:  [97 %-100 %] 100 % (03/31 0942) Weight:  [110.9 kg (244 lb 7.8 oz)] 110.9 kg (244 lb 7.8 oz) (03/30 2131) Last BM Date: 12/05/11  Intake/Output from previous day: 03/30 0701 - 03/31 0700 In: 720 [P.O.:720] Out: 1375 [Urine:1375] Intake/Output this shift: Total I/O In: 240 [P.O.:240] Out: -     Lab Results:  Basename 12/09/11 0618 12/07/11 1008  WBC 15.1* 12.6*  HGB 11.8* 11.7*  HCT 36.0 35.5*  PLT 153 125*   BMET  Basename 12/09/11 0618 12/07/11 1714 12/07/11 1008 12/06/11 1743  NA 141 143 144 152*  K 4.0 4.0 4.0 3.7  CL 107 110 111 117*  CO2 26 27 27 28   CREATININE 0.86 0.93 0.89 0.92   LFT  Basename 12/09/11 0618 12/07/11 1008  PROT 6.5 6.5  AST 27 41*  ALT 48* 64*  ALKPHOS 103 108  BILITOT 1.2 1.3*  BILIDIR 0.4* --  IBILI 0.8 --   PT/INR No results found for this basename: LABPROT:3,INR:3 in the last 72 hours PANCREAS No results found for this basename: LIPASE:3 in the last 72 hours       Studies/Results: No results found.  Medications: I have reviewed the patient's current medications.  Assessment/Plan: 1. Autoimmune Hepatitis. Patient doing quite well and liver tests are normal with the IV steroids. She has recovered from her hyperglycemic hyperosmotic state and is due to be sent home and followup regarding her diabetes.  Plan: I would stop all of the medications for hepatic encephalopathy since her liver tests are now returned to normal. I would discharge her on prednisone 20 mg daily with a taper  scheduled to decrease her dose by 5 mg every week down to a dose of 5 mg daily. I would discharge her in 6 MP 50 mg daily. I have told her that she needs to followup in the office with Dr. Dorena Cookey in about 2 weeks to monitor her steroid taper and adjust according.   Milanna Kozlov JR,Maylon Sailors L 12/09/2011, 10:36 AM

## 2011-12-09 NOTE — Progress Notes (Deleted)
Dana Thornton CSN:[id number],MRN:[id number] is a 56 y.o. female,  Outpatient Primary MD for the patient is No primary provider on file.  Chief Complaint  Patient presents with  . Hyperglycemia        Subjective:    Dana Thornton today has, No headache, No chest pain, No abdominal pain - No Nausea, No new weakness tingling or numbness, No Cough - SOB.   Objective:   Filed Vitals:   12/08/11 1800 12/08/11 2131 12/09/11 0543 12/09/11 0942  BP: 154/74 178/69 178/83 167/93  Pulse: 103 60 63 70  Temp: 98.6 F (37 C) 97.9 F (36.6 C) 97.6 F (36.4 C) 97.4 F (36.3 C)  TempSrc: Oral Oral Oral Oral  Resp: 18 18 18 19   Height:      Weight:  110.9 kg (244 lb 7.8 oz)    SpO2: 97% 100% 99% 100%    Wt Readings from Last 3 Encounters:  12/08/11 110.9 kg (244 lb 7.8 oz)     Intake/Output Summary (Last 24 hours) at 12/09/11 0949 Last data filed at 12/09/11 0900  Gross per 24 hour  Intake    840 ml  Output    375 ml  Net    465 ml    Exam Awake Alert, Oriented *3, No new F.N deficits, Normal affect [place].AT,PERRAL Supple Neck,No JVD, No cervical lymphadenopathy appriciated.  Symmetrical Chest wall movement, Good air movement bilaterally, CTAB RRR,No Gallops,Rubs or new Murmurs, No Parasternal Heave +ve B.Sounds, Abd Soft, Non tender, No organomegaly appriciated, No rebound -guarding or rigidity. No Cyanosis, Clubbing or edema, No new Rash or bruise     Data Review  CBC  Lab 12/09/11 0618 12/07/11 1008 12/06/11 0500 12/05/11 0425 12/04/11 0540 12/02/11 2211  WBC 15.1* 12.6* 14.0* 20.3* 21.7* --  HGB 11.8* 11.7* 11.3* 10.9* 11.7* --  HCT 36.0 35.5* 35.3* 33.8* 36.2 --  PLT 153 125* 130* 133* 164 --  MCV 85.3 86.4 87.8 88.9 89.8 --  MCH 28.0 28.5 28.1 28.7 29.0 --  MCHC 32.8 33.0 32.0 32.2 32.3 --  RDW 17.6* 17.8* 18.4* 18.9* 18.6* --  LYMPHSABS -- -- 1.9 1.3 1.5 2.3  MONOABS -- -- 0.6 0.6 0.3 0.5  EOSABS -- -- 0.0 0.0 0.0 0.0  BASOSABS -- -- 0.0 0.0 0.0 0.0  BANDABS --  -- -- -- -- --    Chemistries   Lab 12/09/11 0618 12/07/11 1714 12/07/11 1008 12/06/11 1743 12/06/11 0500 12/05/11 0425 12/04/11 0540 12/03/11 0542  NA 141 143 144 152* 155* -- -- --  K 4.0 4.0 4.0 3.7 4.3 -- -- --  CL 107 110 111 117* 122* -- -- --  CO2 26 27 27 28 28  -- -- --  GLUCOSE 156* 222* 283* 162* 225* -- -- --  BUN 18 21 23  30* 35* -- -- --  CREATININE 0.86 0.93 0.89 0.92 0.97 -- -- --  CALCIUM 8.7 8.5 8.3* 8.3* 8.3* -- -- --  MG -- -- -- -- -- -- -- 2.8*  AST 27 -- 41* -- 47* 49* 52* --  ALT 48* -- 64* -- 71* 79* 93* --  ALKPHOS 103 -- 108 -- 107 105 133* --  BILITOT 1.2 -- 1.3* -- 1.2 1.1 1.2 --   ------------------------------------------------------------------------------------------------------------------ estimated creatinine clearance is 86.8 ml/min (by C-G formula based on Cr of 0.86). ------------------------------------------------------------------------------------------------------------------ No results found for this basename: HGBA1C:2 in the last 72 hours ------------------------------------------------------------------------------------------------------------------ No results found for this basename: CHOL:2,HDL:2,LDLCALC:2,TRIG:2,CHOLHDL:2,LDLDIRECT:2 in the last 72 hours ------------------------------------------------------------------------------------------------------------------ No results  found for this basename: TSH,T4TOTAL,FREET3,T3FREE,THYROIDAB in the last 72 hours ------------------------------------------------------------------------------------------------------------------ No results found for this basename: VITAMINB12:2,FOLATE:2,FERRITIN:2,TIBC:2,IRON:2,RETICCTPCT:2 in the last 72 hours  Coagulation profile  Lab 12/03/11 0542  INR 1.26  PROTIME --    No results found for this basename: DDIMER:2 in the last 72 hours  Cardiac Enzymes  Lab 12/02/11 2229  CKMB 2.7  TROPONINI <0.30  MYOGLOBIN --    ------------------------------------------------------------------------------------------------------------------ No components found with this basename: POCBNP:3  Micro Results Recent Results (from the past 240 hour(s))  URINE CULTURE     Status: Normal   Collection Time   12/02/11 10:22 PM      Component Value Range Status Comment   Specimen Description URINE, CATHETERIZED   Final    Special Requests NONE   Final    Culture  Setup Time [id number]   Final    Colony Count NO GROWTH   Final    Culture NO GROWTH   Final    Report Status 12/04/2011 FINAL   Final   CULTURE, BLOOD (ROUTINE X 2)     Status: Normal   Collection Time   12/02/11 10:45 PM      Component Value Range Status Comment   Specimen Description BLOOD RIGHT HAND   Final    Special Requests BOTTLES DRAWN AEROBIC ONLY [facility]   Final    Culture  Setup Time [id number]   Final    Culture NO GROWTH 5 DAYS   Final    Report Status 12/09/2011 FINAL   Final   MRSA PCR SCREENING     Status: Normal   Collection Time   12/03/11  2:56 AM      Component Value Range Status Comment   MRSA by PCR NEGATIVE  NEGATIVE  Final   CULTURE, RESPIRATORY     Status: Normal   Collection Time   12/03/11 12:17 PM      Component Value Range Status Comment   Specimen Description ENDOTRACHEAL ASPIRATE   Final    Special Requests NONE   Final    Gram Stain     Final    Value: NO WBC SEEN     FEW SQUAMOUS EPITHELIAL CELLS PRESENT     RARE GRAM POSITIVE COCCI IN PAIRS   Culture Non-Pathogenic Oropharyngeal-type [place] Isolated.   Final    Report Status 12/05/2011 FINAL   Final     Radiology Reports Ct Abdomen Pelvis Wo Contrast  12/03/2011  *RADIOLOGY REPORT*  Clinical Data: Abdominal pain; elevated LFTs.  Nausea, vomiting and jaundice.  CT ABDOMEN AND PELVIS WITHOUT CONTRAST  Technique:  Multidetector CT imaging of the abdomen and pelvis was performed following the standard protocol without intravenous contrast.  Comparison: CT of the  abdomen and pelvis performed 07/01/2007, and abdominal ultrasound performed 09/27/2008  Findings: Bilateral lower lobe airspace consolidation raises concern for pneumonia, given associated air bronchograms.  The liver and spleen are unremarkable in appearance.  Vaguely increased attenuation within the gallbladder may reflect sludge; no definite stones are identified.  There is no evidence for obstruction or cholecystitis.  The pancreas and adrenal glands are unremarkable.  The kidneys are unremarkable in appearance.  There is no evidence of hydronephrosis.  No renal or ureteral stones are seen.  No perinephric stranding is appreciated.  No free fluid is identified.  The small bowel is unremarkable in appearance.  The stomach is within normal limits.  No acute vascular abnormalities are seen.  Scattered calcification is noted along the abdominal aorta and its branches.  The appendix is normal in caliber, extending about the midline, without evidence for appendicitis.  Mild scattered diverticulosis is noted along the descending and proximal sigmoid colon, without definite evidence of diverticulitis.  The colon is otherwise unremarkable in appearance.  The bladder is decompressed, with a Foley catheter in place.  A small amount of air is noted within the bladder.  The uterus is grossly unremarkable in appearance.  The ovaries are relatively symmetric; no suspicious adnexal masses are seen.  No inguinal lymphadenopathy is seen.  No acute osseous abnormalities are identified.  IMPRESSION:  1.  Bilateral lower lobe airspace opacification raises concern for pneumonia, given associated air bronchograms. 2.  Mild scattered diverticulosis along the descending proximal sigmoid colon, without definite evidence of diverticulitis. 3.  Vaguely increased attenuation within the gallbladder raises concern for sludge; no definite stones identified within the gallbladder.  No evidence for obstruction or cholecystitis.  Original Report  Authenticated By: Tonia Ghent, M.D.   Ct Head Wo Contrast  12/02/2011  *RADIOLOGY REPORT*  Clinical Data: 56 year old female with altered mental status.  CT HEAD WITHOUT CONTRAST  Technique:  Contiguous axial images were obtained from the base of the skull through the vertex without contrast.  Comparison: None  Findings: No acute intracranial abnormalities are identified, including mass lesion or mass effect, hydrocephalus, extra-axial fluid collection, midline shift, hemorrhage, or acute infarction.  The visualized bony calvarium is unremarkable.  IMPRESSION: No evidence of acute intracranial abnormality.  Original Report Authenticated By: Rosendo Gros, M.D.   Dg Chest Port 1 View  12/06/2011  *RADIOLOGY REPORT*  Clinical Data: Assess edema.  PORTABLE CHEST - 1 VIEW  Comparison: Plain film of the chest 12/04/2011 and 12/05/2011.  Findings: Endotracheal tube and NG tube have been removed. Bibasilar airspace opacities persist.  Aeration is improved in the left lung base.  No pneumothorax is identified.  There is cardiomegaly.  IMPRESSION:  1.  Status post extubation and NG tube removal. 2.  Improved left basilar airspace disease with no change in the right base.  Original Report Authenticated By: Bernadene Bell. Maricela Curet, M.D.   Dg Chest Port 1 View  12/05/2011  *RADIOLOGY REPORT*  Clinical Data: Follow up pulmonary edema  PORTABLE CHEST - 1 VIEW  Comparison: 12/04/2011  Findings: Cardiomediastinal silhouette is stable.  Stable endotracheal and NG tube position.  Stable right subclavian central line position with tip in right atrium.  Central mild vascular congestion without convincing pulmonary edema.  Persistent bilateral basilar atelectasis or infiltrate.  Question small left pleural effusion.  IMPRESSION: Stable endotracheal and NG tube position.  Stable right subclavian central line position with tip in right atrium.  Central mild vascular congestion without convincing pulmonary edema.  Persistent  bilateral basilar atelectasis or infiltrate.  Question small left pleural effusion.  Original Report Authenticated By: Natasha Mead, M.D.   Dg Chest Port 1 View  12/04/2011  *RADIOLOGY REPORT*  Clinical Data: Respiratory failure  PORTABLE CHEST - 1 VIEW  Comparison: 12/03/2011  Findings: Endotracheal tube remains present with the tip approximately 4 cm above the carina.  Interval nasogastric tube with tip in stomach.  Central line positioning stable with tip in the right atrium.  Bilateral lower lobe atelectasis present which is slightly more prominent compared to the prior chest x-ray.  No overt pulmonary edema or significant pleural effusions identified.  IMPRESSION: Increased bilateral lower lobe atelectasis.  Original Report Authenticated By: Reola Calkins, M.D.   Dg Chest Port 1 View  12/03/2011  *RADIOLOGY REPORT*  Clinical Data: Endotracheal tube placement.  PORTABLE CHEST - 1 VIEW  Comparison: Chest radiograph performed earlier today at 01:23 a.m.  Findings: The patient's endotracheal tube is seen ending 3 cm above the carina.  The patient's right IJ line is noted ending deep within the right atrium; this should be retracted 7 cm.  The lungs remain hypoexpanded.  Vascular congestion is noted, with mild bibasilar airspace opacity, raising concern for mild interstitial edema.  No pleural effusion or pneumothorax is seen.  The cardiomediastinal silhouette is borderline enlarged.  No acute osseous abnormalities identified.  IMPRESSION:  1.  Endotracheal tube seen ending 3 cm above the carina. 2.  Right IJ line noted ending deep within the right atrium; this should be retracted 7 cm. 3.  Lungs hypoexpanded; vascular congestion and borderline cardiomegaly, with mild bibasilar airspace opacities, raising concern for mild interstitial edema.  These results were called by telephone on 12/03/2011  at  04:17 a.m. to  Nursing on [id number]-[id number], who verbally acknowledged these results.  Original Report Authenticated By:  Tonia Ghent, M.D.   Dg Chest Portable 1 View  12/03/2011  *RADIOLOGY REPORT*  Clinical Data: Central line placement; hyperglycemia.  PORTABLE CHEST - 1 VIEW  Comparison: Chest radiograph performed 12/02/2011  Findings: The patient's right subclavian line is noted ending deep within the right atrium; this should be retracted at least 4 cm.  The lungs are hypoexpanded.  Mild vascular congestion and vascular crowding are noted.  No definite interstitial edema is seen.  No focal consolidation, pleural effusion or pneumothorax is identified.  The cardiomediastinal silhouette is borderline normal in size.  No acute osseous abnormalities are identified.  IMPRESSION:  1.  Right subclavian line noted ending deep within the right atrium; this should be retracted at least 4 cm. 2.  Lungs hypoexpanded, with mild vascular congestion.  No focal consolidation seen.  These results were called by telephone on 12/03/2011  at  01:38 a.m. to  Dr. Glynn Octave, who verbally acknowledged these results.  Original Report Authenticated By: Tonia Ghent, M.D.   Dg Chest Port 1 View  12/02/2011  *RADIOLOGY REPORT*  Clinical Data: 56 year old female with altered mental status.  PORTABLE CHEST - 1 VIEW  Comparison: None  Findings: Upper limits normal heart size and mild pulmonary vascular congestion noted. There is no evidence of focal airspace disease, pulmonary edema, suspicious pulmonary nodule/mass, pleural effusion, or pneumothorax. No acute bony abnormalities are identified.  IMPRESSION: Upper limits normal heart size and mild pulmonary vascular congestion.  Original Report Authenticated By: Rosendo Gros, M.D.    Scheduled Meds:    . antiseptic oral rinse  15 mL Mouth Rinse QID  . bd getting started take home kit  1 kit Other Once  . cefTRIAXone (ROCEPHIN)  IV  1 g Intravenous Q24H  . insulin aspart  0-20 Units Subcutaneous TID WC  . insulin aspart  0-20 Units Subcutaneous TID WC  . insulin aspart  0-5 Units  Subcutaneous QHS  . insulin aspart  8 Units Subcutaneous TID WC  . insulin glargine  25 Units Subcutaneous QHS  . lactulose  30 g Oral BID  . mercaptopurine  50 mg Oral Daily  . methylPREDNISolone (SOLU-MEDROL) injection  40 mg Intravenous Q12H  . pantoprazole  40 mg Oral Q1200  . rifaximin  200 mg Oral TID  . DISCONTD: heparin subcutaneous  5,000 Units Subcutaneous Q8H   Continuous Infusions:  PRN Meds:.labetalol, menthol-cetylpyridinium  Assessment & Plan   Taking over patient 12-08-11 on  day 6 of admission  Patient admitted with DKA - PNA - Resp Failure- ARF(pre renal) - Has Auto Immune Hepatitis - was intubated and extubated, in ICU for few days, now getting better, finishing ABX, DKA and ARF resolved, GI following for Steroid dosing for autoimmune hepatitis.     Acute respiratory failure with hypoxia/Aspiration pneumonia   Stable, not on oxygen, feels fine, afebrile, WBC slightly elevated likely due to steroid use, Rocephin completed course, clinically appears fine and feels good she is not on oxygen no cough no shortness of breath.     Acute renal failure  Due to preRenal azotemia- resolved post IVF    Autoimmune hepatitis/Jaundice  Trend improving, Patient had been noncompliant with her medications prior to admission, Currently on IV Solu-Medrol but anticipate patient will be transitioned over to prednisone and 6 MP by gastroenterology I have discussed this with Dr. Randa Evens on 12/09/2011 and he will put his recommendations today, per him for Xifaxan and lactulose but can be stopped as liver functions are normal.    Hepatic encephalopathy   CT brain negative on 21st of March 2013, agents hepatic encephalopathy is completely resolved now     Thrombocytopenia  Likely related to patient's underlying hepatitis issues, trend improving have stopped heparin use and replaced it with SCDs.    Diabetes mellitus/ DKA, type 2, not at goal  Worse with steroids, Lantus-ISS  all increased, better control now.   CBG (last 3)   Basename 12/09/11 0748 12/08/11 2128 12/08/11 1745  GLUCAP 139* 197* 259*     Hypernatremia  Resolved    HTN (hypertension)  Stable. Continue present Rx.      DVT Prophylaxis Heparin      Leroy Sea M.D on 12/09/2011 at 9:49 AM  [facility] Office  [phone]

## 2011-12-10 LAB — GLUCOSE, CAPILLARY: Glucose-Capillary: 288 mg/dL — ABNORMAL HIGH (ref 70–99)

## 2011-12-10 MED ORDER — PREDNISONE 20 MG PO TABS
20.0000 mg | ORAL_TABLET | Freq: Every day | ORAL | Status: DC
  Administered 2011-12-10: 20 mg via ORAL
  Filled 2011-12-10 (×2): qty 1

## 2011-12-10 MED ORDER — PREDNISONE 20 MG PO TABS: 20.0000 mg | ORAL_TABLET | Freq: Every day | ORAL | Status: AC

## 2011-12-10 MED ORDER — INSULIN ASPART 100 UNIT/ML ~~LOC~~ SOLN: 3.0000 [IU] | Freq: Three times a day (TID) | SUBCUTANEOUS | Status: DC

## 2011-12-10 MED ORDER — MERCAPTOPURINE 50 MG PO TABS: 50.0000 mg | ORAL_TABLET | Freq: Every day | ORAL | Status: AC

## 2011-12-10 MED ORDER — INSULIN GLARGINE 100 UNIT/ML ~~LOC~~ SOLN: 25.0000 [IU] | Freq: Every day | SUBCUTANEOUS | Status: DC

## 2011-12-10 MED ORDER — INSULIN ASPART 100 UNIT/ML ~~LOC~~ SOLN: 0.0000 [IU] | Freq: Three times a day (TID) | SUBCUTANEOUS | Status: DC

## 2011-12-10 MED ORDER — FREESTYLE SYSTEM KIT: 1.0000 | PACK | Freq: Three times a day (TID) | Status: AC

## 2011-12-10 MED ORDER — AMLODIPINE BESYLATE 10 MG PO TABS
10.0000 mg | ORAL_TABLET | Freq: Every day | ORAL | Status: DC
  Administered 2011-12-10: 10 mg via ORAL
  Filled 2011-12-10: qty 1

## 2011-12-10 MED ORDER — INSULIN ASPART 100 UNIT/ML ~~LOC~~ SOLN: 5.0000 [IU] | Freq: Three times a day (TID) | SUBCUTANEOUS | Status: DC

## 2011-12-10 MED ORDER — CARVEDILOL 3.125 MG PO TABS: 3.1250 mg | ORAL_TABLET | Freq: Two times a day (BID) | ORAL | Status: DC

## 2011-12-10 MED ORDER — INSULIN ASPART 100 UNIT/ML ~~LOC~~ SOLN
3.0000 [IU] | Freq: Three times a day (TID) | SUBCUTANEOUS | Status: DC
  Administered 2011-12-10: 3 [IU] via SUBCUTANEOUS

## 2011-12-10 MED ORDER — AMLODIPINE BESYLATE 10 MG PO TABS: 10.0000 mg | ORAL_TABLET | Freq: Every day | ORAL | Status: DC

## 2011-12-10 MED ORDER — INSULIN ASPART 100 UNIT/ML ~~LOC~~ SOLN: SUBCUTANEOUS | Status: DC

## 2011-12-10 NOTE — Progress Notes (Signed)
Pt. Ready to go home,IV was d/c,pt. Education done,pt. Got prescriptions and discharge instructuions

## 2011-12-10 NOTE — Discharge Instructions (Signed)
Follow with Primary MD in 2 days   Get CBC, CMP, checked 2 days by Primary MD and again as instructed by your Primary MD. Get a 2 view Chest X ray done next visit.  Get Medicines reviewed and adjusted.  Please request your Prim.MD to go over all Hospital Tests and Procedure/Radiological results at the follow up, please get all Hospital records sent to your Prim MD by signing hospital release before you go home.   Accuchecks 4 times/day, Once in AM empty stomach and then before each meal. Log in all results and show them to your Prim.MD in 3 days. If any glucose reading is under 80 or above 351 call your Prim MD immidiately. Follow Low glucose instructions for glucose under 80 as instructed.  As you're steroid dose is decreased by Dr. Madilyn Fireman your insulin requirements might go down, please stay in constant touch with your primary physician and get your insulin dose adjusted.   Activity: Fall precautions use walker/cane & assistance as needed  Diet: Diabetic-Heart Healthy, Aspiration precautions.  For Heart failure patients - Check your Weight same time everyday, if you gain over 2 pounds, or you develop in leg swelling, experience more shortness of breath or chest pain, call your Primary MD immediately. Follow Cardiac Low Salt Diet and 1.8 lit/day fluid restriction.  Disposition Home  If you experience worsening of your admission symptoms, develop shortness of breath, life threatening emergency, suicidal or homicidal thoughts you must seek medical attention immediately by calling 911 or calling your MD immediately  if symptoms less severe.  You Must read complete instructions/literature along with all the possible adverse reactions/side effects for all the Medicines you take and that have been prescribed to you. Take any new Medicines after you have completely understood and accpet all the possible adverse reactions/side effects.   Do not drive if your were admitted for syncope or siezures  until you have seen by Primary MD or a Neurologist and advised to drive.  Do not drive when taking Pain medications.    Do not take more than prescribed Pain, Sleep and Anxiety Medications  Special Instructions: If you have smoked or chewed Tobacco  in the last 2 yrs please stop smoking, stop any regular Alcohol  and or any Recreational drug use.  Wear Seat belts while driving.  [facility] [phone], rn and phy therapy

## 2011-12-10 NOTE — Progress Notes (Signed)
Pt. Received the video instructions,she has been watching them with her husband,Hpt's husband has administer insulin subcutaneous  twice.pt and husband verbalized been comfortable with the insulin material and instructions

## 2011-12-10 NOTE — Progress Notes (Signed)
Inpatient Diabetes Program Recommendations  AACE/ADA: New Consensus Statement on Inpatient Glycemic Control (2009)  Target Ranges:  Prepandial:   less than 140 mg/dL      Peak postprandial:   less than 180 mg/dL (1-2 hours)      Critically ill patients:  140 - 180 mg/dL   Reason for Visit: Referral received for CBG education. Bedside RN is to do basic education. Pt can practice using hospital meter.  Inpatient Diabetes Program Recommendations Insulin - Basal: Consider increasing Lantus to 15 units daily. HgbA1C: Consider checking A1C to determine prehosp. glycemic control.  Note: MD, will need to write prescription for glucose meter and pharmacy can give her the options for her meter covered by insurance.  MD, please write on prescription how many times patient is to test in order to get enough strips per month. Thank you, Lenor Coffin, RN, CNS, Diabetes Coordinator [phone])

## 2011-12-10 NOTE — Progress Notes (Addendum)
   CARE MANAGEMENT NOTE 12/10/2011  Patient:  Dana Thornton, Dana Thornton   Account Number:  [id number]  Date Initiated:  12/05/2011  Documentation initiated by:  [facility]  Subjective/Objective Assessment:   n/v, abd pain, unresponsive for 8 hours, insulin drip, fluid, intubated.  temp 91, gluc 1050.     Action/Plan:   Anticipated DC Date:  12/10/2011   Anticipated DC Plan:  HOME W HOME HEALTH SERVICES      DC Planning Services  CM consult      [facility] Choice  HOME HEALTH   Choice offered to / List presented to:  C-1 Patient        HH arranged  HH-1 RN  HH-2 PT      [facility] agency  [facility]   Status of service:  Completed, signed off Medicare Important Message given?   (If response is "NO", the following Medicare IM given date fields will be blank) Date Medicare IM given:   Date Additional Medicare IM given:    Discharge Disposition:  HOME W HOME HEALTH SERVICES  Per UR Regulation:  Reviewed for med. necessity/level of care/duration of stay  If discussed at Long Length of Stay Meetings, dates discussed:    Comments:  4/1 spoke w pt and fam, they chose [facility] from [facility] agency list. ref to donna w [facility] for rn and phy ther. copy of [facility] agency list on chart also. debbie Lashundra Shiveley rn,bsn [phone] HOME HEALTH AGENCIES SERVING [place]   Agencies that are Medicare-Certified and are affiliated with [facility]  Telephone Number Address  [facility]   The [facility] has ownership interest in this company; however, you are under no obligation to use this agency. [phone] or  [phone] [address] [place], [place] [address]   Agencies that are Medicare-Certified and are not affiliated with [facility] Agency Telephone Number Address  [facility] [phone] Fax  [phone] [address], [address] [place], [place]  [address]  [facility] [phone] or [phone] Fax [phone] [address] [place], [place] [address]  [facility] [phone] Fax [phone] [address] [place], [place] [address]  [facility] [phone] Fax [phone] [address] [place], [place]  [address]  [facility] [phone] Fax [phone] [address] Suite [place], [place] [address]  [facility] [phone] [address] [place], [place] [address]  [facility] [phone]  [address] [place], [place] [address]  [facility] [phone] or [phone] Fax [phone] [address] [place], [place]  [address]  [facility] [phone] Fax [phone] [address] [place], [place]  [address]  [facility]  [phone] Fax [phone] [address] [place], [place] [address]

## 2011-12-10 NOTE — Progress Notes (Addendum)
Physical Therapy Treatment and Discharge Patient Details Name: KATHREN SCEARCE MRN: [id number] DOB: 03/18/1956 Today's Date: 12/10/2011  PT Assessment/Plan  PT - Assessment/Plan Comments on Treatment Session: Pt much improved from initial eval. All acute PT goals met and pt presents with no further acute or follow-up PT needs.   PT Plan: All goals met and education completed, patient dischaged from PT services PT Frequency: Min 3X/week Follow Up Recommendations: No PT follow up Equipment Recommended: None recommended by PT PT Goals  Acute Rehab PT Goals PT Goal Formulation: With patient Time For Goal Achievement: 2 weeks Pt will go Supine/Side to Sit: with supervision;with HOB 0 degrees PT Goal: Supine/Side to Sit - Progress: Met Pt will Sit at Edge of Bed: with supervision;3-5 min;with no upper extremity support PT Goal: Sit at Edge Of Bed - Progress: Met Pt will go Sit to Supine/Side: with min assist PT Goal: Sit to Supine/Side - Progress: Met Pt will go Sit to Stand: with min assist PT Goal: Sit to Stand - Progress: Met Pt will go Stand to Sit: with min assist PT Goal: Stand to Sit - Progress: Met Pt will Transfer Bed to Chair/Chair to Bed: with min assist PT Transfer Goal: Bed to Chair/Chair to Bed - Progress: Met Pt will Ambulate: 16 - 50 feet;with min assist;with least restrictive assistive device PT Goal: Ambulate - Progress: Met  PT Treatment Precautions/Restrictions  Precautions Precautions: Fall Restrictions Weight Bearing Restrictions: No Mobility (including Balance) Bed Mobility Bed Mobility: Yes Rolling Right: 7: Independent Right Sidelying to Sit: 7: Independent Sitting - Scoot to Edge of Bed: 7: Independent Sit to Supine: 7: Independent Scooting to HOB: 7: Independent Transfers Transfers: Yes Sit to Stand: 7: Independent;With upper extremity assist Stand to Sit: 7: Independent;With upper extremity assist Ambulation/Gait Ambulation/Gait:  Yes Ambulation/Gait Assistance: 7: Independent Ambulation Distance (Feet): 200 Feet Assistive device: None Gait Pattern: Within Functional Limits Gait velocity: WFL Stairs: No Wheelchair Mobility Wheelchair Mobility: No  Posture/Postural Control Posture/Postural Control: No significant limitations Balance Balance Assessed: Yes Static Sitting Balance Static Sitting - Balance Support: Feet supported;Bilateral upper extremity supported Static Sitting - Level of Assistance: 7: Independent Dynamic Standing Balance Dynamic Standing - Balance Support: No upper extremity supported Dynamic Standing - Level of Assistance: 7: Independent Exercise    End of Session PT - End of Session Equipment Utilized During Treatment: Gait belt Activity Tolerance: Patient tolerated treatment well Patient left: in bed;with call bell in reach Nurse Communication: Mobility status for transfers;Mobility status for ambulation General Behavior During Session: [facility] for tasks performed Cognition: [facility] for tasks performed  Emrys Mceachron 12/10/2011, 2:57 PM Monico Sudduth L. Mordche Hedglin DPT [phone]

## 2011-12-10 NOTE — Discharge Summary (Addendum)
Dana Thornton, 56 y.o., DOB Sep 12, 1955, MRN [id number]. Admission date: 12/02/2011 Discharge Date 12/10/2011 Primary MD No primary provider on file. Admitting Physician Zigmund Gottron, MD  Admission Diagnosis  Dehydration [276.51] Renal failure [586] Hyperglycemia [790.29] unresponsive  Discharge Diagnosis   Principal Problem:  *Acute respiratory failure with hypoxia Active Problems:  DKA, type 2, not at goal  Autoimmune hepatitis  Hepatic encephalopathy  Acute renal failure  Aspiration pneumonia  Hypernatremia  Diabetes mellitus  HTN (hypertension)  Jaundice   Past Medical History  Diagnosis Date  . Diabetes mellitus   . Hypertension     History reviewed. No pertinent past surgical history.   Hospital Course See H&P, Labs, Consult and Test reports for all details in brief, patient was admitted for   Patient admitted with DKA which was caused by 60 mg of prednisone which the patient was taking at home for autoimmune hepatitis, DKA was complicated by pneumonia causing acute respiratory failure requiring intubation and ICU admission, she was subsequently extubated she completed her antibiotics course, her renal failure resolved along with her DKA, she has now been placed on 20 mg of oral prednisone along with 50 mg of 6 MP by Dr. Randa Evens gastroenterologist. Her liver enzymes and liver function appear to be at normal levels now with no evidence of hepatic encephalopathy. Patient will need close glucose and insulin monitoring as her steroid has been switched from intravenous route to oral at a much lower dose, she will get home health and home nursing she has received insulin and Accu-Chek education in the hospital. She does confirm that she has glucose testing supplies at home.   Acute respiratory failure with hypoxia/Aspiration pneumonia  Stable, not on oxygen, feels fine, afebrile, WBC slightly elevated likely due to steroid use, Rocephin completed course, clinically  appears fine and feels good she is not on oxygen no cough no shortness of breath. Walker physical therapy without any distress on room air. Repeat 2 view chest x-ray in a week.   Acute renal failure  Due to preRenal azotemia- resolved post IVF    Autoimmune hepatitis/Jaundice  Trend improving, Patient had been noncompliant with her medications prior to admission, was on IV Solu-Medrol but anticipate patient will be transitioned over to 20 mg of prednisone today and 50 mg of 6 MP by gastroenterology Dr. Randa Evens. I have discussed her medications with Dr. Randa Evens on 12/09/2011 , per him for Xifaxan and lactulose but can be stopped as liver functions are normal.     Hepatic encephalopathy  CT brain negative on 21st of March 2013, agents hepatic encephalopathy is completely resolved now    Thrombocytopenia  Likely related to patient's underlying hepatitis issues, trend improving have stopped heparin use and replaced it with SCDs. And has improved continue outpatient monitoring.   Diabetes mellitus/ DKA, type 2, not at goal   Patient will need close Accu-Chek and insulin monitoring as her steroid dose has been adjusted and will continued to be tapered by Dr. Madilyn Fireman, Will request primary care physician to please monitor her Accu-Chek levels closely she is getting home nursing assistance and I have requested home health nurse also to monitor her sugars closely. Patient has received insulin and Accu-Chek education prior to her discharge.   CBG (last 3)   Basename 12/10/11 0742 12/10/11 0437 12/09/11 2211  GLUCAP 288* 279* 104*       Basename  12/09/11 0748  12/08/11 2128  12/08/11 1745   GLUCAP  139*  197*  259*  Hypernatremia  Resolved  HTN (hypertension)  Medications have been adjusted have added Coreg and Norvasc for better blood pressure control as trend is high.    Consults  pulmonary, renal, GI  Significant Tests:  See full reports for all details     Ct Abdomen Pelvis  Wo Contrast  12/03/2011  *RADIOLOGY REPORT*  Clinical Data: Abdominal pain; elevated LFTs.  Nausea, vomiting and jaundice.  CT ABDOMEN AND PELVIS WITHOUT CONTRAST  Technique:  Multidetector CT imaging of the abdomen and pelvis was performed following the standard protocol without intravenous contrast.  Comparison: CT of the abdomen and pelvis performed 07/01/2007, and abdominal ultrasound performed 09/27/2008  Findings: Bilateral lower lobe airspace consolidation raises concern for pneumonia, given associated air bronchograms.  The liver and spleen are unremarkable in appearance.  Vaguely increased attenuation within the gallbladder may reflect sludge; no definite stones are identified.  There is no evidence for obstruction or cholecystitis.  The pancreas and adrenal glands are unremarkable.  The kidneys are unremarkable in appearance.  There is no evidence of hydronephrosis.  No renal or ureteral stones are seen.  No perinephric stranding is appreciated.  No free fluid is identified.  The small bowel is unremarkable in appearance.  The stomach is within normal limits.  No acute vascular abnormalities are seen.  Scattered calcification is noted along the abdominal aorta and its branches.  The appendix is normal in caliber, extending about the midline, without evidence for appendicitis.  Mild scattered diverticulosis is noted along the descending and proximal sigmoid colon, without definite evidence of diverticulitis.  The colon is otherwise unremarkable in appearance.  The bladder is decompressed, with a Foley catheter in place.  A small amount of air is noted within the bladder.  The uterus is grossly unremarkable in appearance.  The ovaries are relatively symmetric; no suspicious adnexal masses are seen.  No inguinal lymphadenopathy is seen.  No acute osseous abnormalities are identified.  IMPRESSION:  1.  Bilateral lower lobe airspace opacification raises concern for pneumonia, given associated air bronchograms.  2.  Mild scattered diverticulosis along the descending proximal sigmoid colon, without definite evidence of diverticulitis. 3.  Vaguely increased attenuation within the gallbladder raises concern for sludge; no definite stones identified within the gallbladder.  No evidence for obstruction or cholecystitis.  Original Report Authenticated By: Tonia Ghent, M.D.   Ct Head Wo Contrast  12/02/2011  *RADIOLOGY REPORT*  Clinical Data: 56 year old female with altered mental status.  CT HEAD WITHOUT CONTRAST  Technique:  Contiguous axial images were obtained from the base of the skull through the vertex without contrast.  Comparison: None  Findings: No acute intracranial abnormalities are identified, including mass lesion or mass effect, hydrocephalus, extra-axial fluid collection, midline shift, hemorrhage, or acute infarction.  The visualized bony calvarium is unremarkable.  IMPRESSION: No evidence of acute intracranial abnormality.  Original Report Authenticated By: Rosendo Gros, M.D.   Dg Chest Port 1 View  12/06/2011  *RADIOLOGY REPORT*  Clinical Data: Assess edema.  PORTABLE CHEST - 1 VIEW  Comparison: Plain film of the chest 12/04/2011 and 12/05/2011.  Findings: Endotracheal tube and NG tube have been removed. Bibasilar airspace opacities persist.  Aeration is improved in the left lung base.  No pneumothorax is identified.  There is cardiomegaly.  IMPRESSION:  1.  Status post extubation and NG tube removal. 2.  Improved left basilar airspace disease with no change in the right base.  Original Report Authenticated By: Bernadene Bell. D'ALESSIO, M.D.   Dg Chest  Port 1 View  12/05/2011  *RADIOLOGY REPORT*  Clinical Data: Follow up pulmonary edema  PORTABLE CHEST - 1 VIEW  Comparison: 12/04/2011  Findings: Cardiomediastinal silhouette is stable.  Stable endotracheal and NG tube position.  Stable right subclavian central line position with tip in right atrium.  Central mild vascular congestion without convincing  pulmonary edema.  Persistent bilateral basilar atelectasis or infiltrate.  Question small left pleural effusion.  IMPRESSION: Stable endotracheal and NG tube position.  Stable right subclavian central line position with tip in right atrium.  Central mild vascular congestion without convincing pulmonary edema.  Persistent bilateral basilar atelectasis or infiltrate.  Question small left pleural effusion.  Original Report Authenticated By: Natasha Mead, M.D.   Dg Chest Port 1 View  12/04/2011  *RADIOLOGY REPORT*  Clinical Data: Respiratory failure  PORTABLE CHEST - 1 VIEW  Comparison: 12/03/2011  Findings: Endotracheal tube remains present with the tip approximately 4 cm above the carina.  Interval nasogastric tube with tip in stomach.  Central line positioning stable with tip in the right atrium.  Bilateral lower lobe atelectasis present which is slightly more prominent compared to the prior chest x-ray.  No overt pulmonary edema or significant pleural effusions identified.  IMPRESSION: Increased bilateral lower lobe atelectasis.  Original Report Authenticated By: Reola Calkins, M.D.   Dg Chest Port 1 View  12/03/2011  *RADIOLOGY REPORT*  Clinical Data: Endotracheal tube placement.  PORTABLE CHEST - 1 VIEW  Comparison: Chest radiograph performed earlier today at 01:23 a.m.  Findings: The patient's endotracheal tube is seen ending 3 cm above the carina.  The patient's right IJ line is noted ending deep within the right atrium; this should be retracted 7 cm.  The lungs remain hypoexpanded.  Vascular congestion is noted, with mild bibasilar airspace opacity, raising concern for mild interstitial edema.  No pleural effusion or pneumothorax is seen.  The cardiomediastinal silhouette is borderline enlarged.  No acute osseous abnormalities identified.  IMPRESSION:  1.  Endotracheal tube seen ending 3 cm above the carina. 2.  Right IJ line noted ending deep within the right atrium; this should be retracted 7 cm. 3.   Lungs hypoexpanded; vascular congestion and borderline cardiomegaly, with mild bibasilar airspace opacities, raising concern for mild interstitial edema.  These results were called by telephone on 12/03/2011  at  04:17 a.m. to  Nursing on [id number], who verbally acknowledged these results.  Original Report Authenticated By: Tonia Ghent, M.D.   Dg Chest Portable 1 View  12/03/2011  *RADIOLOGY REPORT*  Clinical Data: Central line placement; hyperglycemia.  PORTABLE CHEST - 1 VIEW  Comparison: Chest radiograph performed 12/02/2011  Findings: The patient's right subclavian line is noted ending deep within the right atrium; this should be retracted at least 4 cm.  The lungs are hypoexpanded.  Mild vascular congestion and vascular crowding are noted.  No definite interstitial edema is seen.  No focal consolidation, pleural effusion or pneumothorax is identified.  The cardiomediastinal silhouette is borderline normal in size.  No acute osseous abnormalities are identified.  IMPRESSION:  1.  Right subclavian line noted ending deep within the right atrium; this should be retracted at least 4 cm. 2.  Lungs hypoexpanded, with mild vascular congestion.  No focal consolidation seen.  These results were called by telephone on 12/03/2011  at  01:38 a.m. to  Dr. Glynn Octave, who verbally acknowledged these results.  Original Report Authenticated By: Tonia Ghent, M.D.   Dg Chest Port 1 View  12/02/2011  *RADIOLOGY  REPORT*  Clinical Data: 56 year old female with altered mental status.  PORTABLE CHEST - 1 VIEW  Comparison: None  Findings: Upper limits normal heart size and mild pulmonary vascular congestion noted. There is no evidence of focal airspace disease, pulmonary edema, suspicious pulmonary nodule/mass, pleural effusion, or pneumothorax. No acute bony abnormalities are identified.  IMPRESSION: Upper limits normal heart size and mild pulmonary vascular congestion.  Original Report Authenticated By: Rosendo Gros,  M.D.     Today   Subjective:   Dana Thornton today has no headache,no chest abdominal pain,no new weakness tingling or numbness, feels much better wants to go home today.     Objective:   Blood pressure 148/76, pulse 62, temperature 97.9 F (36.6 C), temperature source Oral, resp. rate 18, height 5\' 2"  (1.575 m), weight 110.269 kg (243 lb 1.6 oz), SpO2 96.00%.  Intake/Output Summary (Last 24 hours) at 12/10/11 1048 Last data filed at 12/10/11 0900  Gross per 24 hour  Intake   1370 ml  Output      0 ml  Net   1370 ml    Exam Awake Alert, Oriented *3, No new F.N deficits, Normal affect [place].AT,PERRAL Supple Neck,No JVD, No cervical lymphadenopathy appriciated.  Symmetrical Chest wall movement, Good air movement bilaterally, CTAB RRR,No Gallops,Rubs or new Murmurs, No Parasternal Heave +ve B.Sounds, Abd Soft, Non tender, No organomegaly appriciated, No rebound -guarding or rigidity. No Cyanosis, Clubbing or edema, No new Rash or bruise  Data Review      CBC w Diff: Lab Results  Component Value Date   WBC 15.1* 12/09/2011   HGB 11.8* 12/09/2011   HCT 36.0 12/09/2011   PLT 153 12/09/2011   LYMPHOPCT 14 12/06/2011   MONOPCT 5 12/06/2011   EOSPCT 0 12/06/2011   BASOPCT 0 12/06/2011   CMP: Lab Results  Component Value Date   NA 141 12/09/2011   K 4.0 12/09/2011   CL 107 12/09/2011   CO2 26 12/09/2011   BUN 18 12/09/2011   CREATININE 0.86 12/09/2011   PROT 6.5 12/09/2011   ALBUMIN 2.5* 12/09/2011   BILITOT 1.2 12/09/2011   ALKPHOS 103 12/09/2011   AST 27 12/09/2011   ALT 48* 12/09/2011  .  Micro Results Recent Results (from the past 240 hour(s))  URINE CULTURE     Status: Normal   Collection Time   12/02/11 10:22 PM      Component Value Range Status Comment   Specimen Description URINE, CATHETERIZED   Final    Special Requests NONE   Final    Culture  Setup Time [id number]   Final    Colony Count NO GROWTH   Final    Culture NO GROWTH   Final    Report Status 12/04/2011  FINAL   Final   CULTURE, BLOOD (ROUTINE X 2)     Status: Normal   Collection Time   12/02/11 10:45 PM      Component Value Range Status Comment   Specimen Description BLOOD RIGHT HAND   Final    Special Requests BOTTLES DRAWN AEROBIC ONLY [facility]   Final    Culture  Setup Time [id number]   Final    Culture NO GROWTH 5 DAYS   Final    Report Status 12/09/2011 FINAL   Final   MRSA PCR SCREENING     Status: Normal   Collection Time   12/03/11  2:56 AM      Component Value Range Status Comment   MRSA by PCR NEGATIVE  NEGATIVE  Final   CULTURE, RESPIRATORY     Status: Normal   Collection Time   12/03/11 12:17 PM      Component Value Range Status Comment   Specimen Description ENDOTRACHEAL ASPIRATE   Final    Special Requests NONE   Final    Gram Stain     Final    Value: NO WBC SEEN     FEW SQUAMOUS EPITHELIAL CELLS PRESENT     RARE GRAM POSITIVE COCCI IN PAIRS   Culture Non-Pathogenic Oropharyngeal-type [place]ora Isolated.   Final    Report Status 12/05/2011 FINAL   Final      Discharge Instructions     Follow with Primary MD in 2 days   Get CBC, CMP, checked 2 days by Primary MD and again as instructed by your Primary MD. Get a 2 view Chest X ray done next visit.  Get Medicines reviewed and adjusted.  Please request your Prim.MD to go over all Hospital Tests and Procedure/Radiological results at the follow up, please get all Hospital records sent to your Prim MD by signing hospital release before you go home.   Accuchecks 4 times/day, Once in AM empty stomach and then before each meal. Log in all results and show them to your Prim.MD in 3 days. If any glucose reading is under 80 or above 351 call your Prim MD immidiately. Follow Low glucose instructions for glucose under 80 as instructed.  As you're steroid dose is decreased by Dr. Madilyn Fireman your insulin requirements might go down, please stay in constant touch with your primary physician and get your insulin dose  adjusted.   Activity: Fall precautions use walker/cane & assistance as needed  Diet: Diabetic-Heart Healthy, Aspiration precautions.  For Heart failure patients - Check your Weight same time everyday, if you gain over 2 pounds, or you develop in leg swelling, experience more shortness of breath or chest pain, call your Primary MD immediately. Follow Cardiac Low Salt Diet and 1.8 lit/day fluid restriction.  Disposition Home  If you experience worsening of your admission symptoms, develop shortness of breath, life threatening emergency, suicidal or homicidal thoughts you must seek medical attention immediately by calling 911 or calling your MD immediately  if symptoms less severe.  You Must read complete instructions/literature along with all the possible adverse reactions/side effects for all the Medicines you take and that have been prescribed to you. Take any new Medicines after you have completely understood and accpet all the possible adverse reactions/side effects.   Do not drive if your were admitted for syncope or siezures until you have seen by Primary MD or a Neurologist and advised to drive.  Do not drive when taking Pain medications.    Do not take more than prescribed Pain, Sleep and Anxiety Medications  Special Instructions: If you have smoked or chewed Tobacco  in the last 2 yrs please stop smoking, stop any regular Alcohol  and or any Recreational drug use.  Wear Seat belts while driving.  [facility] [phone], rn and phy therapy   Follow-up Information    Follow up with Astrid Divine, MD. Schedule an appointment as soon as possible for a visit in 2 days.   Contact information:   [address], [address] [place] [address] [phone]       Follow up with HAYES,JOHN C, MD. Schedule an appointment as soon as possible for a visit in 5 days.   Contact information:   [address] [facility],  [place]. [place] [address] [phone]          Discharge Medications   Medication List  As of 12/10/2011  2:15 PM   START taking these medications         amLODipine 10 MG tablet   Commonly known as: NORVASC   Take 1 tablet (10 mg total) by mouth daily.      carvedilol 3.125 MG tablet   Commonly known as: COREG   Take 1 tablet (3.125 mg total) by mouth 2 (two) times daily with a meal.      glucose monitoring kit monitoring kit   1 each by Does not apply route 4 (four) times daily - after meals and at bedtime. 1 month Diabetic Testing Supplies for QAC-QHS accuchecks.      * insulin aspart 100 UNIT/ML injection   Commonly known as: novoLOG   Before each meal 3 times a day, 120-150 - 2 units, 151-200 - 4 units, 201-250 - 8 units, 251-300 - 10 units,  301 or above 10 units and call your MD      * insulin aspart 100 UNIT/ML injection   Commonly known as: novoLOG   Inject 3 Units into the skin 3 (three) times daily with meals. Plus the sliding scale with meals.      insulin glargine 100 UNIT/ML injection   Commonly known as: LANTUS   Inject 25 Units into the skin at bedtime.      mercaptopurine 50 MG tablet   Commonly known as: PURINETHOL   Take 1 tablet (50 mg total) by mouth daily. Give on an empty stomach 1 hour before or 2 hours after meals. Caution: Chemotherapy.  Please get the medication dose adjusted by Dr. Madilyn Fireman your stomach doctor in a week.      predniSONE 20 MG tablet   Commonly known as: DELTASONE   Take 1 tablet (20 mg total) by mouth daily with breakfast. This medicine will be changed by Dr Madilyn Fireman only     * Notice: This list has 2 medication(s) that are the same as other medications prescribed for you. Read the directions carefully, and ask your doctor or other care provider to review them with you.       CONTINUE taking these medications         HYDROcodone-acetaminophen 5-325 MG per tablet   Commonly known as: NORCO      promethazine 25 MG  tablet   Commonly known as: PHENERGAN          Where to get your medications    These are the prescriptions that you need to pick up. We sent them to a specific pharmacy, so you will need to go there to get them.   [id number] - [place], [place] - [address] AT [facility] [address]    [address] [place] [address]    Phone: [phone]        glucose monitoring kit monitoring kit         You may get these medications from any pharmacy.         amLODipine 10 MG tablet   carvedilol 3.125 MG tablet   insulin aspart 100 UNIT/ML injection   insulin glargine 100 UNIT/ML injection   mercaptopurine 50 MG tablet   predniSONE 20 MG tablet           Total Time in preparing paper  work, Patent examiner and todays exam - 35 minutes  Leroy Sea M.D on 12/10/2011 at 10:48 AM  [facility] Office  [phone]

## 2012-07-23 ENCOUNTER — Other Ambulatory Visit: Payer: Self-pay | Admitting: Family Medicine

## 2012-07-23 DIAGNOSIS — Z1231 Encounter for screening mammogram for malignant neoplasm of breast: Secondary | ICD-10-CM

## 2012-09-01 ENCOUNTER — Ambulatory Visit
Admission: RE | Admit: 2012-09-01 | Discharge: 2012-09-01 | Disposition: A | Discharge status: Home / Self Care | Payer: 59 | Source: Ambulatory Visit | Attending: Family Medicine | Admitting: Family Medicine

## 2012-09-01 DIAGNOSIS — Z1231 Encounter for screening mammogram for malignant neoplasm of breast: Secondary | ICD-10-CM

## 2013-07-29 ENCOUNTER — Other Ambulatory Visit: Payer: Self-pay

## 2013-07-29 DIAGNOSIS — Z1231 Encounter for screening mammogram for malignant neoplasm of breast: Secondary | ICD-10-CM

## 2013-09-07 ENCOUNTER — Ambulatory Visit: Payer: 59

## 2013-09-08 ENCOUNTER — Ambulatory Visit
Admission: RE | Admit: 2013-09-08 | Discharge: 2013-09-08 | Disposition: A | Discharge status: Home / Self Care | Payer: 59 | Source: Ambulatory Visit

## 2013-09-08 DIAGNOSIS — Z1231 Encounter for screening mammogram for malignant neoplasm of breast: Secondary | ICD-10-CM

## 2014-07-13 ENCOUNTER — Other Ambulatory Visit: Payer: Self-pay

## 2014-07-13 DIAGNOSIS — Z1231 Encounter for screening mammogram for malignant neoplasm of breast: Secondary | ICD-10-CM

## 2014-09-09 ENCOUNTER — Ambulatory Visit
Admission: RE | Admit: 2014-09-09 | Discharge: 2014-09-09 | Disposition: A | Discharge status: Home / Self Care | Payer: 59 | Source: Ambulatory Visit

## 2014-09-09 DIAGNOSIS — Z1231 Encounter for screening mammogram for malignant neoplasm of breast: Secondary | ICD-10-CM

## 2015-03-28 ENCOUNTER — Encounter (HOSPITAL_COMMUNITY): Payer: Self-pay | Admitting: Emergency Medicine

## 2015-03-28 ENCOUNTER — Inpatient Hospital Stay (HOSPITAL_COMMUNITY)
Admission: EM | Admit: 2015-03-28 | Discharge: 2015-04-01 | DRG: 637 | Disposition: A | Discharge status: Home / Self Care | Payer: 59 | Attending: Internal Medicine | Admitting: Internal Medicine

## 2015-03-28 DIAGNOSIS — I10 Essential (primary) hypertension: Secondary | ICD-10-CM | POA: Diagnosis present

## 2015-03-28 DIAGNOSIS — E1165 Type 2 diabetes mellitus with hyperglycemia: Principal | ICD-10-CM | POA: Diagnosis present

## 2015-03-28 DIAGNOSIS — E43 Unspecified severe protein-calorie malnutrition: Secondary | ICD-10-CM | POA: Diagnosis present

## 2015-03-28 DIAGNOSIS — Z7982 Long term (current) use of aspirin: Secondary | ICD-10-CM

## 2015-03-28 DIAGNOSIS — E1143 Type 2 diabetes mellitus with diabetic autonomic (poly)neuropathy: Secondary | ICD-10-CM | POA: Diagnosis present

## 2015-03-28 DIAGNOSIS — Z794 Long term (current) use of insulin: Secondary | ICD-10-CM

## 2015-03-28 DIAGNOSIS — Z87442 Personal history of urinary calculi: Secondary | ICD-10-CM

## 2015-03-28 DIAGNOSIS — T380X5A Adverse effect of glucocorticoids and synthetic analogues, initial encounter: Secondary | ICD-10-CM | POA: Diagnosis present

## 2015-03-28 DIAGNOSIS — E860 Dehydration: Secondary | ICD-10-CM

## 2015-03-28 DIAGNOSIS — Z6841 Body Mass Index (BMI) 40.0 and over, adult: Secondary | ICD-10-CM

## 2015-03-28 DIAGNOSIS — R630 Anorexia: Secondary | ICD-10-CM | POA: Diagnosis present

## 2015-03-28 DIAGNOSIS — N179 Acute kidney failure, unspecified: Secondary | ICD-10-CM | POA: Diagnosis present

## 2015-03-28 DIAGNOSIS — E1159 Type 2 diabetes mellitus with other circulatory complications: Secondary | ICD-10-CM | POA: Diagnosis present

## 2015-03-28 DIAGNOSIS — E870 Hyperosmolality and hypernatremia: Secondary | ICD-10-CM | POA: Diagnosis present

## 2015-03-28 DIAGNOSIS — R112 Nausea with vomiting, unspecified: Secondary | ICD-10-CM | POA: Diagnosis not present

## 2015-03-28 DIAGNOSIS — K754 Autoimmune hepatitis: Secondary | ICD-10-CM | POA: Diagnosis present

## 2015-03-28 DIAGNOSIS — E780 Pure hypercholesterolemia: Secondary | ICD-10-CM | POA: Diagnosis present

## 2015-03-28 DIAGNOSIS — IMO0002 Reserved for concepts with insufficient information to code with codable children: Secondary | ICD-10-CM | POA: Diagnosis present

## 2015-03-28 DIAGNOSIS — Z9851 Tubal ligation status: Secondary | ICD-10-CM

## 2015-03-28 DIAGNOSIS — IMO0001 Reserved for inherently not codable concepts without codable children: Secondary | ICD-10-CM | POA: Diagnosis present

## 2015-03-28 DIAGNOSIS — K3184 Gastroparesis: Secondary | ICD-10-CM | POA: Diagnosis present

## 2015-03-28 DIAGNOSIS — B370 Candidal stomatitis: Secondary | ICD-10-CM | POA: Diagnosis present

## 2015-03-28 DIAGNOSIS — N390 Urinary tract infection, site not specified: Secondary | ICD-10-CM | POA: Diagnosis present

## 2015-03-28 DIAGNOSIS — Z885 Allergy status to narcotic agent status: Secondary | ICD-10-CM

## 2015-03-28 HISTORY — DX: Pure hypercholesterolemia, unspecified: E78.00

## 2015-03-28 HISTORY — DX: Calculus of kidney: N20.0

## 2015-03-28 HISTORY — DX: Type 2 diabetes mellitus without complications: E11.9

## 2015-03-28 LAB — COMPREHENSIVE METABOLIC PANEL
ALBUMIN: 3.4 g/dL — AB (ref 3.5–5.0)
ALT: 142 U/L — ABNORMAL HIGH (ref 14–54)
AST: 65 U/L — AB (ref 15–41)
Alkaline Phosphatase: 130 U/L — ABNORMAL HIGH (ref 38–126)
Anion gap: 14 (ref 5–15)
BILIRUBIN TOTAL: 1.6 mg/dL — AB (ref 0.3–1.2)
BUN: 52 mg/dL — AB (ref 6–20)
CALCIUM: 9.5 mg/dL (ref 8.9–10.3)
CHLORIDE: 119 mmol/L — AB (ref 101–111)
CO2: 24 mmol/L (ref 22–32)
CREATININE: 2.37 mg/dL — AB (ref 0.44–1.00)
GFR, EST AFRICAN AMERICAN: 25 mL/min — AB (ref >60)
GFR, EST NON AFRICAN AMERICAN: 21 mL/min — AB (ref >60)
Glucose, Bld: 557 mg/dL (ref 65–99)
Potassium: 4.4 mmol/L (ref 3.5–5.1)
SODIUM: 157 mmol/L — AB (ref 135–145)
TOTAL PROTEIN: 8.4 g/dL — AB (ref 6.5–8.1)

## 2015-03-28 LAB — CBC WITH DIFFERENTIAL/PLATELET
BASOS PCT: 0 % (ref 0–1)
Basophils Absolute: 0 10*3/uL (ref 0.0–0.1)
EOS ABS: 0 10*3/uL (ref 0.0–0.7)
EOS PCT: 0 % (ref 0–5)
HCT: 48.2 % — ABNORMAL HIGH (ref 36.0–46.0)
Hemoglobin: 15.7 g/dL — ABNORMAL HIGH (ref 12.0–15.0)
Lymphocytes Relative: 14 % (ref 12–46)
Lymphs Abs: 1.9 10*3/uL (ref 0.7–4.0)
MCH: 27.3 pg (ref 26.0–34.0)
MCHC: 32.6 g/dL (ref 30.0–36.0)
MCV: 83.8 fL (ref 78.0–100.0)
MONO ABS: 1 10*3/uL (ref 0.1–1.0)
Monocytes Relative: 7 % (ref 3–12)
Neutro Abs: 10.9 10*3/uL — ABNORMAL HIGH (ref 1.7–7.7)
Neutrophils Relative %: 79 % — ABNORMAL HIGH (ref 43–77)
PLATELETS: 236 10*3/uL (ref 150–400)
RBC: 5.75 MIL/uL — AB (ref 3.87–5.11)
RDW: 19.4 % — AB (ref 11.5–15.5)
WBC: 13.8 10*3/uL — ABNORMAL HIGH (ref 4.0–10.5)

## 2015-03-28 LAB — CBG MONITORING, ED: Glucose-Capillary: 441 mg/dL — ABNORMAL HIGH (ref 65–99)

## 2015-03-28 MED ORDER — SODIUM CHLORIDE 0.9 % IV BOLUS (SEPSIS)
1000.0000 mL | Freq: Once | INTRAVENOUS | Status: AC
  Administered 2015-03-28: 1000 mL via INTRAVENOUS

## 2015-03-28 NOTE — ED Provider Notes (Signed)
CSN: [id number]     Arrival date & time 03/28/15  2208 History  This chart was scribed for Mirian Mo, MD by Evon Slack, ED Scribe. This patient was seen in room A01C/A01C and the patient's care was started at 11:08 PM.     Chief Complaint  Patient presents with  . Blurred Vision  . Emesis   Patient is a 59 y.o. female presenting with vomiting. The history is provided by the patient. No language interpreter was used.  Emesis Severity:  Moderate Duration:  5 days Timing:  Intermittent Progression:  Unchanged Chronicity:  New Relieved by:  Nothing Associated symptoms: no abdominal pain, no diarrhea and no fever   Risk factors: diabetes    HPI Comments: Dana Thornton is a 59 y.o. female who presents to the Emergency Department complaining of blurred vision described as a "haze" over her eyes that began 4-5 days. Pt states she has had decreased appetite, nausea, vomiting. She states that she was recently placed on 60 mg of prednisone for abdnormal liver enzymes. Pt states that since starting the medicine she notice that everything she eats taste like medicine. She states that she talked to her PCP today and was told her liver enzymes were back in normal limits and to taper down to 20 mg of prednisone. She sates after the taper down she still has not had any relief. Pt denies fever, diarrhea or abdominal pain.    Past Medical History  Diagnosis Date  . Diabetes mellitus   . Hypertension    History reviewed. No pertinent past surgical history. No family history on file. History  Substance Use Topics  . Smoking status: Never Smoker   . Smokeless tobacco: Never Used  . Alcohol Use: No   OB History    No data available      Review of Systems  Constitutional: Positive for appetite change.  Eyes: Positive for visual disturbance.  Gastrointestinal: Positive for nausea and vomiting. Negative for abdominal pain and diarrhea.  All other systems reviewed and are  negative.    Allergies  Codeine  Home Medications   Prior to Admission medications   Medication Sig Start Date End Date Taking? Authorizing Provider  aluminum-magnesium hydroxide-simethicone (MAALOX) 200-200-20 MG/5ML SUSP Take 30 mLs by mouth 2 (two) times daily.   Yes Historical Provider, MD  amLODipine (NORVASC) 10 MG tablet Take 1 tablet (10 mg total) by mouth daily. 12/10/11 03/28/15 Yes Leroy Sea, MD  aspirin 325 MG tablet Take 325 mg by mouth daily.   Yes Historical Provider, MD  calcium elemental as carbonate (BARIATRIC TUMS ULTRA) 400 MG tablet Chew 1,000 mg by mouth daily.   Yes Historical Provider, MD  carvedilol (COREG) 6.25 MG tablet Take 6.25 mg by mouth 2 (two) times daily with a meal.   Yes Historical Provider, MD  insulin aspart (NOVOLOG) 100 UNIT/ML injection Inject 3 Units into the skin 3 (three) times daily with meals. Plus the sliding scale with meals. Patient taking differently: Inject 5 Units into the skin 2 (two) times daily. Plus the sliding scale with meals. 12/10/11 03/28/15 Yes Leroy Sea, MD  insulin glargine (LANTUS) 100 UNIT/ML injection Inject 25 Units into the skin at bedtime. Patient taking differently: Inject 16 Units into the skin at bedtime.  12/10/11 03/28/15 Yes Leroy Sea, MD  levothyroxine (SYNTHROID, LEVOTHROID) 200 MCG tablet Take 200 mcg by mouth daily before breakfast.   Yes Historical Provider, MD  mercaptopurine (PURINETHOL) 50 MG tablet Take 50  mg by mouth daily. Give on an empty stomach 1 hour before or 2 hours after meals. Caution: Chemotherapy.   Yes Historical Provider, MD  ondansetron (ZOFRAN-ODT) 4 MG disintegrating tablet Take 4 mg by mouth every 8 (eight) hours as needed for nausea or vomiting.   Yes Historical Provider, MD  predniSONE (DELTASONE) 20 MG tablet Take 60 mg by mouth daily with breakfast.   Yes Historical Provider, MD  quinapril (ACCUPRIL) 10 MG tablet Take 10 mg by mouth daily.   Yes Historical Provider, MD   carvedilol (COREG) 3.125 MG tablet Take 1 tablet (3.125 mg total) by mouth 2 (two) times daily with a meal. Patient not taking: Reported on 03/28/2015 12/10/11 12/09/12  Leroy SeaPrashant K Singh, MD  insulin aspart (NOVOLOG) 100 UNIT/ML injection Before each meal 3 times a day, 120-150 - 2 units, 151-200 - 4 units, 201-250 - 8 units, 251-300 - 10 units,  301 or above 10 units and call your MD Patient not taking: Reported on 03/28/2015 12/10/11   Leroy SeaPrashant K Singh, MD   BP 125/86 mmHg  Pulse 128  Temp(Src) 98 F (36.7 C)  Resp 20  SpO2 98%   Physical Exam  Constitutional: She is oriented to person, place, and time. She appears well-developed and well-nourished.  HENT:  Head: Normocephalic and atraumatic.  Right Ear: External ear normal.  Left Ear: External ear normal.  Eyes: Conjunctivae and EOM are normal. Pupils are equal, round, and reactive to light.  Neck: Normal range of motion. Neck supple.  Cardiovascular: Regular rhythm, normal heart sounds and intact distal pulses.  Tachycardia present.   Pulmonary/Chest: Effort normal and breath sounds normal.  Abdominal: Soft. Bowel sounds are normal. There is no tenderness.  Musculoskeletal: Normal range of motion.  Neurological: She is alert and oriented to person, place, and time.  Skin: Skin is warm and dry.  Vitals reviewed.   ED Course  Procedures (including critical care time) DIAGNOSTIC STUDIES: Oxygen Saturation is 96% on RA, adequate by my interpretation.    COORDINATION OF CARE: 11:30 PM-Discussed treatment plan with pt at bedside and pt agreed to plan.     Labs Review Labs Reviewed  CBC WITH DIFFERENTIAL/PLATELET - Abnormal; Notable for the following:    WBC 13.8 (*)    RBC 5.75 (*)    Hemoglobin 15.7 (*)    HCT 48.2 (*)    RDW 19.4 (*)    Neutrophils Relative % 79 (*)    Neutro Abs 10.9 (*)    All other components within normal limits  COMPREHENSIVE METABOLIC PANEL - Abnormal; Notable for the following:    Sodium 157 (*)     Chloride 119 (*)    Glucose, Bld 557 (*)    BUN 52 (*)    Creatinine, Ser 2.37 (*)    Total Protein 8.4 (*)    Albumin 3.4 (*)    AST 65 (*)    ALT 142 (*)    Alkaline Phosphatase 130 (*)    Total Bilirubin 1.6 (*)    GFR calc non Af Amer 21 (*)    GFR calc Af Amer 25 (*)    All other components within normal limits  CBG MONITORING, ED - Abnormal; Notable for the following:    Glucose-Capillary 441 (*)    All other components within normal limits    Imaging Review No results found.   EKG Interpretation   Date/Time:  Monday March 28 2015 22:18:29 EDT Ventricular Rate:  141 PR Interval:  112  QRS Duration: 66 QT Interval:  284 QTC Calculation: 434 R Axis:   46 Text Interpretation:  Sinus tachycardia Nonspecific ST and T wave  abnormality Abnormal ECG SINCE LAST TRACING HEART RATE HAS INCREASED  Confirmed by Mirian Mo [phone]) on 03/28/2015 11:58:32 PM      MDM   Final diagnoses:  Dehydration      59 y.o. female with pertinent PMH of DM, HTN, autoimmune hepatitis presents with nausea, anorexia, malaise in setting of being on prednisone.  Pt endorses decrease PO intake over the last week. Physical exam as above.  Wu as above with elevated cr, likely dehydration mediated.  Admitted in stable condition.    I have reviewed all laboratory and imaging studies if ordered as above  1. Dehydration           Mirian Mo, MD 03/29/15 [phone]

## 2015-03-28 NOTE — ED Notes (Addendum)
Pt. reports blurred vision with nausea onset 3 days ago , emesis once today , denies diarrhea , no fever or chills. Tachycardic at arrival 140's/min .

## 2015-03-28 NOTE — ED Notes (Signed)
Patient presents to ED with reports of nausea/vomiting, decreased appetite, blurred vision x 5 days. Denies diarrhea. Denies pain.

## 2015-03-29 ENCOUNTER — Encounter (HOSPITAL_COMMUNITY): Payer: Self-pay | Admitting: General Practice

## 2015-03-29 DIAGNOSIS — E870 Hyperosmolality and hypernatremia: Secondary | ICD-10-CM | POA: Diagnosis present

## 2015-03-29 DIAGNOSIS — I10 Essential (primary) hypertension: Secondary | ICD-10-CM

## 2015-03-29 DIAGNOSIS — N179 Acute kidney failure, unspecified: Secondary | ICD-10-CM | POA: Diagnosis present

## 2015-03-29 DIAGNOSIS — Z885 Allergy status to narcotic agent status: Secondary | ICD-10-CM | POA: Diagnosis not present

## 2015-03-29 DIAGNOSIS — R112 Nausea with vomiting, unspecified: Secondary | ICD-10-CM | POA: Diagnosis present

## 2015-03-29 DIAGNOSIS — Z794 Long term (current) use of insulin: Secondary | ICD-10-CM | POA: Diagnosis not present

## 2015-03-29 DIAGNOSIS — N390 Urinary tract infection, site not specified: Secondary | ICD-10-CM | POA: Diagnosis present

## 2015-03-29 DIAGNOSIS — IMO0001 Reserved for inherently not codable concepts without codable children: Secondary | ICD-10-CM | POA: Diagnosis present

## 2015-03-29 DIAGNOSIS — E1165 Type 2 diabetes mellitus with hyperglycemia: Secondary | ICD-10-CM | POA: Diagnosis not present

## 2015-03-29 DIAGNOSIS — B370 Candidal stomatitis: Secondary | ICD-10-CM | POA: Diagnosis present

## 2015-03-29 DIAGNOSIS — R630 Anorexia: Secondary | ICD-10-CM

## 2015-03-29 DIAGNOSIS — E780 Pure hypercholesterolemia: Secondary | ICD-10-CM | POA: Diagnosis present

## 2015-03-29 DIAGNOSIS — E1143 Type 2 diabetes mellitus with diabetic autonomic (poly)neuropathy: Secondary | ICD-10-CM | POA: Diagnosis present

## 2015-03-29 DIAGNOSIS — K754 Autoimmune hepatitis: Secondary | ICD-10-CM | POA: Diagnosis present

## 2015-03-29 DIAGNOSIS — Z9851 Tubal ligation status: Secondary | ICD-10-CM | POA: Diagnosis not present

## 2015-03-29 DIAGNOSIS — E43 Unspecified severe protein-calorie malnutrition: Secondary | ICD-10-CM | POA: Diagnosis present

## 2015-03-29 DIAGNOSIS — E860 Dehydration: Secondary | ICD-10-CM | POA: Diagnosis present

## 2015-03-29 DIAGNOSIS — K3184 Gastroparesis: Secondary | ICD-10-CM | POA: Diagnosis present

## 2015-03-29 DIAGNOSIS — T380X5A Adverse effect of glucocorticoids and synthetic analogues, initial encounter: Secondary | ICD-10-CM | POA: Diagnosis present

## 2015-03-29 DIAGNOSIS — Z6841 Body Mass Index (BMI) 40.0 and over, adult: Secondary | ICD-10-CM | POA: Diagnosis not present

## 2015-03-29 DIAGNOSIS — Z87442 Personal history of urinary calculi: Secondary | ICD-10-CM | POA: Diagnosis not present

## 2015-03-29 DIAGNOSIS — Z7982 Long term (current) use of aspirin: Secondary | ICD-10-CM | POA: Diagnosis not present

## 2015-03-29 LAB — BASIC METABOLIC PANEL
ANION GAP: 7 (ref 5–15)
Anion gap: 6 (ref 5–15)
Anion gap: 6 (ref 5–15)
BUN: 51 mg/dL — ABNORMAL HIGH (ref 6–20)
BUN: 46 mg/dL — AB (ref 6–20)
BUN: 45 mg/dL — ABNORMAL HIGH (ref 6–20)
CALCIUM: 8.8 mg/dL — AB (ref 8.9–10.3)
CHLORIDE: 124 mmol/L — AB (ref 101–111)
CHLORIDE: 120 mmol/L — AB (ref 101–111)
CO2: 29 mmol/L (ref 22–32)
CO2: 27 mmol/L (ref 22–32)
CO2: 28 mmol/L (ref 22–32)
CREATININE: 2.26 mg/dL — AB (ref 0.44–1.00)
Calcium: 9.2 mg/dL (ref 8.9–10.3)
Calcium: 9.1 mg/dL (ref 8.9–10.3)
Chloride: 125 mmol/L — ABNORMAL HIGH (ref 101–111)
Creatinine, Ser: 1.91 mg/dL — ABNORMAL HIGH (ref 0.44–1.00)
Creatinine, Ser: 1.95 mg/dL — ABNORMAL HIGH (ref 0.44–1.00)
GFR calc Af Amer: 26 mL/min — ABNORMAL LOW (ref >60)
GFR calc Af Amer: 32 mL/min — ABNORMAL LOW (ref >60)
GFR calc non Af Amer: 28 mL/min — ABNORMAL LOW (ref >60)
GFR calc non Af Amer: 27 mL/min — ABNORMAL LOW (ref >60)
GFR, EST AFRICAN AMERICAN: 31 mL/min — AB (ref >60)
GFR, EST NON AFRICAN AMERICAN: 23 mL/min — AB (ref >60)
Glucose, Bld: 285 mg/dL — ABNORMAL HIGH (ref 65–99)
Glucose, Bld: 169 mg/dL — ABNORMAL HIGH (ref 65–99)
Glucose, Bld: 204 mg/dL — ABNORMAL HIGH (ref 65–99)
Potassium: 4 mmol/L (ref 3.5–5.1)
Potassium: 4 mmol/L (ref 3.5–5.1)
Potassium: 4.3 mmol/L (ref 3.5–5.1)
SODIUM: 159 mmol/L — AB (ref 135–145)
Sodium: 159 mmol/L — ABNORMAL HIGH (ref 135–145)
Sodium: 154 mmol/L — ABNORMAL HIGH (ref 135–145)

## 2015-03-29 LAB — URINALYSIS, ROUTINE W REFLEX MICROSCOPIC
GLUCOSE, UA: NEGATIVE mg/dL
HGB URINE DIPSTICK: NEGATIVE
Ketones, ur: 15 mg/dL — AB
Nitrite: NEGATIVE
PROTEIN: NEGATIVE mg/dL
SPECIFIC GRAVITY, URINE: 1.019 (ref 1.005–1.030)
UROBILINOGEN UA: 1 mg/dL (ref 0.0–1.0)
pH: 5 (ref 5.0–8.0)

## 2015-03-29 LAB — GLUCOSE, CAPILLARY
GLUCOSE-CAPILLARY: 327 mg/dL — AB (ref 65–99)
GLUCOSE-CAPILLARY: 125 mg/dL — AB (ref 65–99)
GLUCOSE-CAPILLARY: 119 mg/dL — AB (ref 65–99)
GLUCOSE-CAPILLARY: 133 mg/dL — AB (ref 65–99)
GLUCOSE-CAPILLARY: 184 mg/dL — AB (ref 65–99)
Glucose-Capillary: 302 mg/dL — ABNORMAL HIGH (ref 65–99)
Glucose-Capillary: 256 mg/dL — ABNORMAL HIGH (ref 65–99)
Glucose-Capillary: 204 mg/dL — ABNORMAL HIGH (ref 65–99)
Glucose-Capillary: 189 mg/dL — ABNORMAL HIGH (ref 65–99)
Glucose-Capillary: 158 mg/dL — ABNORMAL HIGH (ref 65–99)
Glucose-Capillary: 197 mg/dL — ABNORMAL HIGH (ref 65–99)
Glucose-Capillary: 160 mg/dL — ABNORMAL HIGH (ref 65–99)
Glucose-Capillary: 270 mg/dL — ABNORMAL HIGH (ref 65–99)

## 2015-03-29 LAB — CBC
HCT: 45.5 % (ref 36.0–46.0)
Hemoglobin: 14.7 g/dL (ref 12.0–15.0)
MCH: 27.4 pg (ref 26.0–34.0)
MCHC: 32.3 g/dL (ref 30.0–36.0)
MCV: 84.7 fL (ref 78.0–100.0)
Platelets: 218 10*3/uL (ref 150–400)
RBC: 5.37 MIL/uL — AB (ref 3.87–5.11)
RDW: 19.7 % — ABNORMAL HIGH (ref 11.5–15.5)
WBC: 14.5 10*3/uL — ABNORMAL HIGH (ref 4.0–10.5)

## 2015-03-29 LAB — URINE MICROSCOPIC-ADD ON

## 2015-03-29 MED ORDER — HEPARIN SODIUM (PORCINE) 5000 UNIT/ML IJ SOLN
5000.0000 [IU] | Freq: Two times a day (BID) | INTRAMUSCULAR | Status: DC
  Administered 2015-03-29 – 2015-04-01 (×7): 5000 [IU] via SUBCUTANEOUS
  Filled 2015-03-29 (×8): qty 1

## 2015-03-29 MED ORDER — CEFTRIAXONE SODIUM IN DEXTROSE 20 MG/ML IV SOLN
1.0000 g | INTRAVENOUS | Status: DC
  Administered 2015-03-29 – 2015-03-31 (×3): 1 g via INTRAVENOUS
  Filled 2015-03-29 (×4): qty 50

## 2015-03-29 MED ORDER — PROMETHAZINE HCL 25 MG/ML IJ SOLN
25.0000 mg | INTRAMUSCULAR | Status: DC | PRN
  Administered 2015-03-29: 25 mg via INTRAVENOUS
  Filled 2015-03-29: qty 1

## 2015-03-29 MED ORDER — SODIUM CHLORIDE 0.9 % IV SOLN
INTRAVENOUS | Status: DC
  Administered 2015-03-29: 2.4 [IU]/h via INTRAVENOUS
  Filled 2015-03-29: qty 2.5

## 2015-03-29 MED ORDER — DEXTROSE 5 % IV SOLN
INTRAVENOUS | Status: DC
  Administered 2015-03-29: 05:00:00 via INTRAVENOUS

## 2015-03-29 MED ORDER — CARVEDILOL 6.25 MG PO TABS
6.2500 mg | ORAL_TABLET | Freq: Two times a day (BID) | ORAL | Status: DC
  Administered 2015-03-29 – 2015-04-01 (×6): 6.25 mg via ORAL
  Filled 2015-03-29 (×9): qty 1

## 2015-03-29 MED ORDER — MERCAPTOPURINE 50 MG PO TABS
50.0000 mg | ORAL_TABLET | ORAL | Status: DC
  Administered 2015-03-30 – 2015-04-01 (×2): 50 mg via ORAL
  Filled 2015-03-29 (×2): qty 1

## 2015-03-29 MED ORDER — PREDNISONE 20 MG PO TABS
20.0000 mg | ORAL_TABLET | Freq: Every day | ORAL | Status: DC
  Administered 2015-03-29 – 2015-04-01 (×4): 20 mg via ORAL
  Filled 2015-03-29 (×5): qty 1

## 2015-03-29 MED ORDER — INSULIN GLARGINE 100 UNIT/ML ~~LOC~~ SOLN: 25.0000 [IU] | Freq: Every day | SUBCUTANEOUS | Status: DC

## 2015-03-29 MED ORDER — FLUCONAZOLE IN SODIUM CHLORIDE 200-0.9 MG/100ML-% IV SOLN
200.0000 mg | Freq: Once | INTRAVENOUS | Status: AC
  Administered 2015-03-29: 200 mg via INTRAVENOUS
  Filled 2015-03-29: qty 100

## 2015-03-29 MED ORDER — CARVEDILOL 6.25 MG PO TABS
6.2500 mg | ORAL_TABLET | Freq: Once | ORAL | Status: AC
  Administered 2015-03-29: 6.25 mg via ORAL

## 2015-03-29 MED ORDER — FLUCONAZOLE 100MG IVPB
100.0000 mg | INTRAVENOUS | Status: DC
  Administered 2015-03-30 – 2015-04-01 (×3): 100 mg via INTRAVENOUS
  Filled 2015-03-29 (×4): qty 50

## 2015-03-29 MED ORDER — INSULIN REGULAR BOLUS VIA INFUSION
0.0000 [IU] | Freq: Three times a day (TID) | INTRAVENOUS | Status: DC
  Administered 2015-03-29 (×2): 2 [IU] via INTRAVENOUS
  Filled 2015-03-29: qty 10

## 2015-03-29 MED ORDER — NYSTATIN 100000 UNIT/ML MT SUSP
5.0000 mL | Freq: Four times a day (QID) | OROMUCOSAL | Status: DC
  Administered 2015-03-29 – 2015-04-01 (×13): 500000 [IU] via ORAL
  Filled 2015-03-29 (×16): qty 5

## 2015-03-29 MED ORDER — SODIUM CHLORIDE 0.45 % IV SOLN
INTRAVENOUS | Status: DC
  Administered 2015-03-29: 04:00:00 via INTRAVENOUS

## 2015-03-29 MED ORDER — INSULIN ASPART 100 UNIT/ML ~~LOC~~ SOLN
3.0000 [IU] | Freq: Three times a day (TID) | SUBCUTANEOUS | Status: DC
  Administered 2015-03-29 – 2015-03-30 (×2): 3 [IU] via SUBCUTANEOUS

## 2015-03-29 MED ORDER — DEXTROSE-NACL 5-0.45 % IV SOLN: INTRAVENOUS | Status: DC

## 2015-03-29 MED ORDER — DEXTROSE 50 % IV SOLN: 25.0000 mL | INTRAVENOUS | Status: DC | PRN

## 2015-03-29 MED ORDER — INSULIN GLARGINE 100 UNIT/ML ~~LOC~~ SOLN
25.0000 [IU] | Freq: Every day | SUBCUTANEOUS | Status: DC
  Administered 2015-03-29: 25 [IU] via SUBCUTANEOUS
  Filled 2015-03-29 (×2): qty 0.25

## 2015-03-29 MED ORDER — INSULIN ASPART 100 UNIT/ML ~~LOC~~ SOLN: 0.0000 [IU] | SUBCUTANEOUS | Status: DC

## 2015-03-29 MED ORDER — DEXTROSE 5 % IV SOLN
INTRAVENOUS | Status: DC
  Administered 2015-03-30 – 2015-03-31 (×2): via INTRAVENOUS

## 2015-03-29 MED ORDER — LEVOTHYROXINE SODIUM 200 MCG PO TABS: 200.0000 ug | ORAL_TABLET | Freq: Every day | ORAL | Status: DC

## 2015-03-29 MED ORDER — SODIUM CHLORIDE 0.9 % IV SOLN
INTRAVENOUS | Status: DC
  Filled 2015-03-29: qty 2.5

## 2015-03-29 MED ORDER — LEVOTHYROXINE SODIUM 100 MCG IV SOLR
100.0000 ug | Freq: Every day | INTRAVENOUS | Status: DC
  Administered 2015-03-29 – 2015-03-30 (×2): 100 ug via INTRAVENOUS
  Filled 2015-03-29 (×2): qty 5

## 2015-03-29 MED ORDER — ASPIRIN 325 MG PO TABS
325.0000 mg | ORAL_TABLET | Freq: Every day | ORAL | Status: DC
  Administered 2015-03-29 – 2015-04-01 (×4): 325 mg via ORAL
  Filled 2015-03-29 (×4): qty 1

## 2015-03-29 MED ORDER — INSULIN ASPART 100 UNIT/ML ~~LOC~~ SOLN
0.0000 [IU] | Freq: Three times a day (TID) | SUBCUTANEOUS | Status: DC
  Administered 2015-03-29: 3 [IU] via SUBCUTANEOUS
  Administered 2015-03-30: 5 [IU] via SUBCUTANEOUS
  Administered 2015-03-30: 15 [IU] via SUBCUTANEOUS
  Administered 2015-03-30: 8 [IU] via SUBCUTANEOUS
  Administered 2015-03-31: 3 [IU] via SUBCUTANEOUS
  Administered 2015-03-31 – 2015-04-01 (×3): 5 [IU] via SUBCUTANEOUS

## 2015-03-29 MED ORDER — SODIUM CHLORIDE 0.9 % IV SOLN: INTRAVENOUS | Status: DC

## 2015-03-29 MED ORDER — BOOST / RESOURCE BREEZE PO LIQD
1.0000 | Freq: Two times a day (BID) | ORAL | Status: DC
  Administered 2015-03-29 – 2015-04-01 (×6): 1 via ORAL

## 2015-03-29 NOTE — Progress Notes (Signed)
Patient unable to take oral medicines due to thrush in the mouth, paged the Dr. Danie BinderIskra Myers, according to her she wanted to hold her oral meds which cannot be given through IV till she checks the patient.

## 2015-03-29 NOTE — Plan of Care (Signed)
Problem: Consults Goal: Skin Care Protocol Initiated - if Braden Score 18 or less If consults are not indicated, leave blank or document N/A Outcome: Progressing Mouth with thrush

## 2015-03-29 NOTE — Progress Notes (Addendum)
Pt seen and examined at bedside. Admitted after midnight, please see full H&P done by Dr. Arlean HoppingSchertz.   In brief, pt is 59 yo female who presented with N/V, abd pain, blurry vision, CBG > 400.  Assessment: 1. Uncontrolled DM 2 - CBG's better this AM in 150 - 200's, stop insulin drip and place on long acting insulin. Continue IVF 2. Hypernatremia - still volume depleted, continue IVF, repeat BMP tonight and again in AM 3. Thrush - due to steroids/ uncont DM2.Place on Diflucan  4. Acute renal failure - pre renal, IVF have been provided and Cr is trending down, repeat BMP in AM 5. Leukocytosis - steroid induced, ? UTI, placed on empiric Rocephin for now, follow up on urine cultures  6. Morbid obesity - Body mass index is 43.52 kg/(m^2).   Debbora PrestoMAGICK-Poppy Mcafee, MD  [facility] Pager [phone]  If 7PM-7AM, please contact night-coverage [email or web address] Password [email or web address]

## 2015-03-29 NOTE — Progress Notes (Signed)
Called ED nurse for report,ED will call back .

## 2015-03-29 NOTE — H&P (Addendum)
[facility] History and Physical  Dana Thornton ZOX:[id number] DOB: Dec 06, 1955 DOA: 03/28/2015  Referring physician: Judie PetitM. Littie DeedsGentry , MD PCP: Astrid DivineGRIFFIN,ELAINE COLLINS, MD  Specialists: Dr. Dorena CookeyJohn Hayes, GI  Chief Complaint: Anorexia, nausea/ vomiting, blurred visions with BS > 400  HPI: Dana Thornton is a 59 y.o. female with hx of DM2, HTN , HL and kidney stones.  She presented to ED today with a hx of blurred vision, nausea and emesis over the last few days.  No fever or chills. Food doesn't taste good, is losing weight. Blood sugars have been higher than usual today.  No diarrhea.  +dry heaves/ nausea.  BS has been HHH. She has DM2 on lantus and meal coverage insulin.   No fever, or chills, no CP/ SOB/ HA, joint pains or skin rash.    Where does patient live home Can patient participate in ADLs? Yes of course  Past Medical History  Past Medical History  Diagnosis Date  . Hypertension   . Kidney stones   . Hypercholesterolemia   . Type II diabetes mellitus    Past Surgical History  Past Surgical History  Procedure Laterality Date  . Dilation and curettage of uterus      "I lost a baby"  . Cesarean section  1999  . Tubal ligation  1999   Family History History reviewed. No pertinent family history. Mother is alive with DM2. Father alive with DM and HTN. No hx liver disease, cancer or blood clotting in the family.  Social History  reports that she has never smoked. She has never used smokeless tobacco. She reports that she does not drink alcohol or use illicit drugs. Allergies  Allergies  Allergen Reactions  . Codeine Nausea And Vomiting   Home medications Prior to Admission medications   Medication Sig Start Date End Date Taking? Authorizing Provider  aluminum-magnesium hydroxide-simethicone (MAALOX) 200-200-20 MG/5ML SUSP Take 30 mLs by mouth 2 (two) times daily.   Yes Historical Provider, MD  amLODipine (NORVASC) 10 MG tablet Take 1 tablet (10 mg total) by mouth  daily. 12/10/11 03/28/15 Yes Leroy SeaPrashant K Singh, MD  aspirin 325 MG tablet Take 325 mg by mouth daily.   Yes Historical Provider, MD  calcium elemental as carbonate (BARIATRIC TUMS ULTRA) 400 MG tablet Chew 1,000 mg by mouth daily.   Yes Historical Provider, MD  carvedilol (COREG) 6.25 MG tablet Take 6.25 mg by mouth 2 (two) times daily with a meal.   Yes Historical Provider, MD  insulin aspart (NOVOLOG) 100 UNIT/ML injection Inject 3 Units into the skin 3 (three) times daily with meals. Plus the sliding scale with meals. Patient taking differently: Inject 5 Units into the skin 2 (two) times daily. Plus the sliding scale with meals. 12/10/11 03/28/15 Yes Leroy SeaPrashant K Singh, MD  insulin glargine (LANTUS) 100 UNIT/ML injection Inject 25 Units into the skin at bedtime. Patient taking differently: Inject 16 Units into the skin at bedtime.  12/10/11 03/28/15 Yes Leroy SeaPrashant K Singh, MD  levothyroxine (SYNTHROID, LEVOTHROID) 200 MCG tablet Take 200 mcg by mouth daily before breakfast.   Yes Historical Provider, MD  mercaptopurine (PURINETHOL) 50 MG tablet Take 50 mg by mouth daily. Give on an empty stomach 1 hour before or 2 hours after meals. Caution: Chemotherapy.   Yes Historical Provider, MD  ondansetron (ZOFRAN-ODT) 4 MG disintegrating tablet Take 4 mg by mouth every 8 (eight) hours as needed for nausea or vomiting.   Yes Historical Provider, MD  predniSONE (DELTASONE) 20 MG tablet  Take 60 mg by mouth daily with breakfast.   Yes Historical Provider, MD  quinapril (ACCUPRIL) 10 MG tablet Take 10 mg by mouth daily.   Yes Historical Provider, MD  carvedilol (COREG) 3.125 MG tablet Take 1 tablet (3.125 mg total) by mouth 2 (two) times daily with a meal. Patient not taking: Reported on 03/28/2015 12/10/11 12/09/12  Leroy Sea, MD  insulin aspart (NOVOLOG) 100 UNIT/ML injection Before each meal 3 times a day, 120-150 - 2 units, 151-200 - 4 units, 201-250 - 8 units, 251-300 - 10 units,  301 or above 10 units and call your  MD Patient not taking: Reported on 03/28/2015 12/10/11   Leroy Sea, MD   Liver Function Tests  Recent Labs Lab 03/28/15 2226  AST 65*  ALT 142*  ALKPHOS 130*  BILITOT 1.6*  PROT 8.4*  ALBUMIN 3.4*   No results for input(s): LIPASE, AMYLASE in the last 168 hours. CBC  Recent Labs Lab 03/28/15 2226  WBC 13.8*  NEUTROABS 10.9*  HGB 15.7*  HCT 48.2*  MCV 83.8  PLT 236   Basic Metabolic Panel  Recent Labs Lab 03/28/15 2226  NA 157*  K 4.4  CL 119*  CO2 24  GLUCOSE 557*  BUN 52*  CREATININE 2.37*  CALCIUM 9.5    EKG: Independently reviewed > sinus tach, no acute chg  Filed Vitals:   03/29/15 0015 03/29/15 0030 03/29/15 0045 03/29/15 0133  BP: 106/56 114/79 121/69 159/92  Pulse: 103 109 105 114  Temp:    98.2 F (36.8 C)  TempSrc:      Resp: Height:     (1.575 m)  Weight:    107.956 kg (238 lb)  SpO2: 98% 97% 97% 98%   Exam: Alert, no distress, tired No rash, cyanosis or gangrene Sclera anicteric, throat white patches on hard palate and most of post tongue c/w thrush Neck no jvd Chest clear bilat RRR no MRG Abd obese, soft ntnd no ascites or HSM GU deferred MS no joint effusion / deformity No LE or UE edema Neuro is nf, ox 3, alert  Na 157  (adj Na 161)  K 4.4 Cl 119  CO2 24  BUN 52  Creat 2.37  Glu 557 AST 65   ALT 142  Tbili 1.6  Alb 3.4 WBC 13k  Hb 15  plt 236 EKG sinus tach, no acute chgs   Assessment: 1. Uncontrolled DM 2- BS's 500-600 currently. Plan give lantus tonight +SSI. No evidence of DKA.  2. Hypernatremia -sig water deficits  3. Thrush - due to steroids/ uncont DM2.  This would explain the wt loss/ anorexia 4. Hx kidney stones 5. HTN   6. Autoimmune hepatitis/ ^ LFT's - on mercaptopurine and pred taper, ok to drop to 20/day now    Plan - D5W at 150/ hr, IV diflucan, glucostabilizer for now.  .   DVT Prophylaxis heparin SQ  Code Status: full  Family Communication: husband in the room  Disposition  Plan: home when stable    [name] D [facility] Pager [phone]  If 7PM-7AM, please contact night-coverage [email or web address] Password [email or web address] 03/29/2015, 2:02 AM

## 2015-03-29 NOTE — Progress Notes (Addendum)
Initial Nutrition Assessment  DOCUMENTATION CODES:   Severe malnutrition in context of acute illness/injury, Morbid obesity   Pt meets criteria for SEVERE MALNUTRITION in the context of acute illness as evidenced by a 8.8% weight loss in 2 weeks, and energy intake </= 50% for >/= 5 days.  INTERVENTION:   Provide Boost Breeze po BID, each supplement provides 250 kcal and 9 grams of protein.  Encourage adequate PO intake.   NUTRITION DIAGNOSIS:   Inadequate oral intake related to poor appetite, mouth pain as evidenced by per patient/family report.  GOAL:   Patient will meet greater than or equal to 90% of their needs  MONITOR:   PO intake, Supplement acceptance, Diet advancement, Weight trends, Labs, I & O's  REASON FOR ASSESSMENT:   Malnutrition Screening Tool    ASSESSMENT:   59 y.o. female with hx of DM2, HTN , HL and kidney stones. She presented to ED today with a hx of blurred vision, nausea and emesis over the last few days. No fever or chills. Food doesn't taste good, is losing weight. Blood sugars have been higher than usual today. No diarrhea. +dry heaves/ nausea.   Pt reports having a decreased appetite and having thrush which has been ongoing over the past 2 weeks. Pt reports she has been mostly consuming fluids at home and only took bites out of food. Pt endorses weight loss. Usual body weight reported to be 261 lbs where she reports weighing last 2 weeks ago. Pt with a 8.8% weight loss in 2 weeks. Pt is currently on a clear liquid diet. Meal completion has been 75%. Pt is agreeable to Boost Breeze to aid in caloric and protein needs. RD to order.   Pt with no observed significant fat or muscle mass loss.   Labs: Low GFR. High sodium, chloride, BUN, and creatinine.   Diet Order:  Diet clear liquid Room service appropriate?: Yes; Fluid consistency:: Thin  Skin:   (generalized edema)  Last BM:  PTA  Height:   Ht Readings from Last 1 Encounters:   03/29/15 5\' 2"  (1.575 m)    Weight:   Wt Readings from Last 1 Encounters:  03/29/15 238 lb (107.956 kg)    Ideal Body Weight:  50 kg  Wt Readings from Last 10 Encounters:  03/29/15 238 lb (107.956 kg)  12/09/11 243 lb 1.6 oz (110.269 kg)    BMI:  Body mass index is 43.52 kg/(m^2).  Estimated Nutritional Needs:   Kcal:  1950-2150  Protein:  105-120 grams  Fluid:  1.9 - 2.1 L/day  EDUCATION NEEDS:   No education needs identified at this time  Roslyn SmilingStephanie Jeffifer Rabold, MS, RD, LDN Pager # [phone] After hours/ weekend pager # [phone]

## 2015-03-29 NOTE — Progress Notes (Signed)
Inpatient Diabetes Program Recommendations  AACE/ADA: New Consensus Statement on Inpatient Glycemic Control (2013)  Target Ranges:  Prepandial:   less than 140 mg/dL      Peak postprandial:   less than 180 mg/dL (1-2 hours)      Critically ill patients:  140 - 180 mg/dL   Results for Dana Thornton, Dana Thornton (MRN [id number]) as of 03/29/2015 12:24  Ref. Range 03/29/2015 05:38  Sodium Latest Ref Range: 135-145 mmol/L 159 (H)  Potassium Latest Ref Range: 3.5-5.1 mmol/L 4.0  Chloride Latest Ref Range: 101-111 mmol/L 124 (H)  CO2 Latest Ref Range: 22-32 mmol/L 29  BUN Latest Ref Range: 6-20 mg/dL 51 (H)  Creatinine Latest Ref Range: 0.44-1.00 mg/dL 2.26 (H)  Calcium Latest Ref Range: 8.9-10.3 mg/dL 9.2  EGFR (Non-African Amer.) Latest Ref Range: >60 mL/min 23 (L)  EGFR (African American) Latest Ref Range: >60 mL/min 26 (L)  Glucose Latest Ref Range: 65-99 mg/dL 285 (H)  Anion gap Latest Ref Range: 5-15  6     Admit with: Hyperglycemia  History: DM, HTN  Home DM Meds: Lantus 16 units QHS        Novolog 5 units bid         Novolog SSI  Current DM Orders: IV Insulin drip (started at 4AM)    MD- Note glucose levels have improved.  CO2 and Anion Gap WNL.  When patient ready to transition off IV insulin drip, please make sure she receives at least 80% of her home dose of Lantus 1-2 hours before IV Insulin drip stopped     Will follow Wyn Quaker RN, MSN, CDE Diabetes Coordinator Inpatient Glycemic Control Team Team Pager: [phone] (8a-5p)

## 2015-03-30 DIAGNOSIS — E43 Unspecified severe protein-calorie malnutrition: Secondary | ICD-10-CM | POA: Insufficient documentation

## 2015-03-30 LAB — CBC
HEMATOCRIT: 43.3 % (ref 36.0–46.0)
HEMOGLOBIN: 14.2 g/dL (ref 12.0–15.0)
MCH: 27.5 pg (ref 26.0–34.0)
MCHC: 32.8 g/dL (ref 30.0–36.0)
MCV: 83.9 fL (ref 78.0–100.0)
PLATELETS: UNDETERMINED 10*3/uL (ref 150–400)
RBC: 5.16 MIL/uL — ABNORMAL HIGH (ref 3.87–5.11)
RDW: 19.7 % — ABNORMAL HIGH (ref 11.5–15.5)
WBC: 12.2 10*3/uL — AB (ref 4.0–10.5)

## 2015-03-30 LAB — BASIC METABOLIC PANEL
Anion gap: 10 (ref 5–15)
BUN: 44 mg/dL — AB (ref 6–20)
CHLORIDE: 117 mmol/L — AB (ref 101–111)
CO2: 26 mmol/L (ref 22–32)
Calcium: 8.8 mg/dL — ABNORMAL LOW (ref 8.9–10.3)
Creatinine, Ser: 1.86 mg/dL — ABNORMAL HIGH (ref 0.44–1.00)
GFR calc non Af Amer: 29 mL/min — ABNORMAL LOW (ref >60)
GFR, EST AFRICAN AMERICAN: 33 mL/min — AB (ref >60)
Glucose, Bld: 315 mg/dL — ABNORMAL HIGH (ref 65–99)
POTASSIUM: 4.6 mmol/L (ref 3.5–5.1)
SODIUM: 153 mmol/L — AB (ref 135–145)

## 2015-03-30 LAB — GLUCOSE, CAPILLARY
GLUCOSE-CAPILLARY: 248 mg/dL — AB (ref 65–99)
Glucose-Capillary: 275 mg/dL — ABNORMAL HIGH (ref 65–99)
Glucose-Capillary: 434 mg/dL — ABNORMAL HIGH (ref 65–99)
Glucose-Capillary: 434 mg/dL — ABNORMAL HIGH (ref 65–99)
Glucose-Capillary: 76 mg/dL (ref 65–99)

## 2015-03-30 MED ORDER — INSULIN GLARGINE 100 UNIT/ML ~~LOC~~ SOLN
32.0000 [IU] | Freq: Every day | SUBCUTANEOUS | Status: DC
  Filled 2015-03-30 (×2): qty 0.32

## 2015-03-30 MED ORDER — INSULIN GLARGINE 100 UNIT/ML ~~LOC~~ SOLN
30.0000 [IU] | Freq: Every day | SUBCUTANEOUS | Status: DC
  Filled 2015-03-30: qty 0.3

## 2015-03-30 MED ORDER — LEVOTHYROXINE SODIUM 200 MCG PO TABS
200.0000 ug | ORAL_TABLET | Freq: Every day | ORAL | Status: DC
  Administered 2015-03-31 – 2015-04-01 (×2): 200 ug via ORAL
  Filled 2015-03-30 (×3): qty 1

## 2015-03-30 MED ORDER — INSULIN ASPART 100 UNIT/ML ~~LOC~~ SOLN
5.0000 [IU] | Freq: Three times a day (TID) | SUBCUTANEOUS | Status: DC
  Administered 2015-03-30 – 2015-04-01 (×5): 5 [IU] via SUBCUTANEOUS

## 2015-03-30 NOTE — Progress Notes (Signed)
Inpatient Diabetes Program Recommendations  AACE/ADA: New Consensus Statement on Inpatient Glycemic Control (2013)  Target Ranges:  Prepandial:   less than 140 mg/dL      Peak postprandial:   less than 180 mg/dL (1-2 hours)      Critically ill patients:  140 - 180 mg/dL   Inpatient Diabetes Program Recommendations Insulin - Basal: Increase Lantus to 32 units  Thank you  Crystallee Werden BSN, RN,CDE Inpatient Diabetes Coordinator [phone] (team pager)   

## 2015-03-30 NOTE — Evaluation (Signed)
Physical Therapy Evaluation and Discharge Patient Details Name: Dana LamerGloria W Thornton MRN: [id number] DOB: 1955/11/10 Today's Date: 03/30/2015   History of Present Illness  59 yo female who presented with N/V, abd pain, blurry vision, CBG > 400. Problem list includes: Uncontrolled DM 2 with complications of gastroparesis, Hypernatremia,  Acute renal failure, leukocytosis,  Acute renal failure, UTI, Transaminitis, and morbid obesity.  Clinical Impression  Patient evaluated by Physical Therapy with no further acute PT needs identified. All education has been completed and the patient has no further questions. Ambulates with mild balance deficits, exacerbated by higher level dynamic gait challenges. No overt loss of balance requiring physical assist at any point. Husband and patient report Dana Thornton's mobility is nearly at her baseline. She will have 24 hour supervision as needed from family. See below for any follow-up Physial Therapy or equipment needs. PT is signing off. Thank you for this referral.     Follow Up Recommendations No PT follow up;Supervision for mobility/OOB    Equipment Recommendations  None recommended by PT    Recommendations for Other Services       Precautions / Restrictions Precautions Precautions: Fall Restrictions Weight Bearing Restrictions: No      Mobility  Bed Mobility               General bed mobility comments: sitting EOB  Transfers Overall transfer level: Modified independent Equipment used: None             General transfer comment: Slow to rise, no assist needed from bed.  Ambulation/Gait Ambulation/Gait assistance: Supervision Ambulation Distance (Feet): 200 Feet Assistive device: None Gait Pattern/deviations: Step-through pattern;Decreased stride length;Staggering left Gait velocity: decreased Gait velocity interpretation: Below normal speed for age/gender General Gait Details: Occasional stagger to Lt, reaches for rail on wall  at times, declines use of assistive device. Per pt and husband her gait is near baseline. Did not require physical assist to correct at any point. Increased sway noted with horizontal head turns while ambulating. Able to march and step backwards safely. Also tolerated variable speeds and quick turns without loss of balance.  Stairs            Wheelchair Mobility    Modified Rankin (Stroke Patients Only)       Balance Overall balance assessment: Needs assistance Sitting-balance support: No upper extremity supported;Feet supported Sitting balance-Leahy Scale: Good     Standing balance support: No upper extremity supported Standing balance-Leahy Scale: Fair                               Pertinent Vitals/Pain Pain Assessment: No/denies pain    Home Living Family/patient expects to be discharged to:: Private residence Living Arrangements: Spouse/significant other;Children Available Help at Discharge: Family;Available 24 hours/day Type of Home: House Home Access: Level entry     Home Layout: One level Home Equipment: None      Prior Function Level of Independence: Independent               Hand Dominance   Dominant Hand: Right    Extremity/Trunk Assessment   Upper Extremity Assessment: Defer to OT evaluation           Lower Extremity Assessment: Overall WFL for tasks assessed (grossly 4/5)         Communication   Communication: No difficulties  Cognition Arousal/Alertness: Awake/alert Behavior During Therapy: WFL for tasks assessed/performed Overall Cognitive Status: Within Functional Limits for  tasks assessed                      General Comments General comments (skin integrity, edema, etc.): Discussed possibility of using a cane at discharge. Pt and family open to idea. Also provided information to consider OP PT if pt feels she is not progressing on her own back to her baseline with mobility.    Exercises         Assessment/Plan    PT Assessment Patent does not need any further PT services  PT Diagnosis Abnormality of gait   PT Problem List    PT Treatment Interventions     PT Goals (Current goals can be found in the Care Plan section) Acute Rehab PT Goals Patient Stated Goal: Go home PT Goal Formulation: All assessment and education complete, DC therapy    Frequency     Barriers to discharge        Co-evaluation               End of Session   Activity Tolerance: Patient tolerated treatment well Patient left: in bed;with call bell/phone within reach;with family/visitor present Nurse Communication: Mobility status         Time: 1721-1734 PT Time Calculation (min) (ACUTE ONLY): 13 min   Charges:   PT Evaluation $Initial PT Evaluation Tier I: 1 Procedure     PT G CodesBerton Mount 03/30/2015, 5:58 PM [name], [place] [phone]

## 2015-03-30 NOTE — Progress Notes (Addendum)
Patient ID: Dana Thornton Bona, female   DOB: [birth year], 59 y.o.   MRN: [id number]  [facility] PROGRESS NOTE  Dana Thornton Hemphill WUJ:[id number] DOB: [birth year] DOA: 03/28/2015 PCP: Astrid DivineGRIFFIN,ELAINE COLLINS, MD   Brief narrative:    In brief, pt is 59 yo female who presented with N/V, abd pain, blurry vision, CBG > 400.  Assessment/Plan:    1. Uncontrolled DM 2 with complications of gastroparesis  - has been off insulin drip since 7/19, increase the lantus dose to 32 U with meal coverage of 5 U, diabetic educator consulted and assistance is appreciated  2. Hypernatremia - still volume depleted, continue IVF, repeat BMP in AM 3. Thrush - due to steroids/ uncont DM2.Placed on Diflucan and overall better 4. Acute renal failure - pre renal, IVF have been provided and Cr is trending down, repeat BMP in AM 5. Leukocytosis - steroid induced, ? UTI, placed on empiric Rocephin for now, follow up on urine cultures  6. UTI - started empiric Rocephin, follow up on urine cx, readjust the ABX regimen as indicated, WBC is trending down 7. Transaminitis - pt with known history of autoimmune hepatitis, will repeat CMET in AM. Continue mercaptopurine  8. Morbid obesity - Body mass index is 43.52 kg/(m^2) 9. Severe PCM - in the context of acute illness, appreciate nutritionist consultation   DVT prophylaxis - Heparin SQ  Code Status: Full.  Family Communication:  plan of care discussed with the patient and husband at bedside  Disposition Plan: Home when stable.   IV access:  Peripheral IV  Procedures and diagnostic studies:    None   Medical Consultants:  None   Other Consultants:  DM  IAnti-Infectives:   Rocephin 7/19 -->  Debbora PrestoMAGICK-Thalya Fouche, MD  [facility] Pager [phone]  If 7PM-7AM, please contact night-coverage [email or web address] Password [email or web address] 03/30/2015, 12:52 PM   LOS: 1 day   HPI/Subjective: No events overnight.   Objective: Filed Vitals:   03/29/15 0451 03/29/15 2037 03/30/15  0436 03/30/15 0903  BP: 131/85 152/84 132/82 146/87  Pulse: 105 87 74 55  Temp: 97.9 F (36.6 C) 98.4 F (36.9 C) 98 F (36.7 C) 97.3 F (36.3 C)  TempSrc:    Oral  Resp: 18 19 18 20   Height:      Weight:  108.863 kg (240 lb)    SpO2: 96% 97% 99% 98%    Intake/Output Summary (Last 24 hours) at 03/30/15 1252 Last data filed at 03/30/15 0900  Gross per 24 hour  Intake 1418.75 ml  Output    175 ml  Net 1243.75 ml    Exam:   General:  Pt is alert, follows commands appropriately, not in acute distress, oral thrush   Cardiovascular: Regular rhythm, bradycardia, S1/S2, no murmurs, no rubs, no gallops  Respiratory: Clear to auscultation bilaterally, no wheezing, no crackles, no rhonchi  Abdomen: Soft, non tender, non distended, bowel sounds present, no guarding  Extremities: No edema, pulses DP and PT palpable bilaterally  Neuro: Grossly nonfocal  Data Reviewed: Basic Metabolic Panel:  Recent Labs Lab 03/28/15 2226 03/29/15 0538 03/29/15 1420 03/29/15 1703 03/30/15 0421  NA 157* 159* 159* 154* 153*  K 4.4 4.0 4.0 4.3 4.6  CL 119* 124* 125* 120* 117*  CO2 24 29 27 28 26   GLUCOSE 557* 285* 169* 204* 315*  BUN 52* 51* 46* 45* 44*  CREATININE 2.37* 2.26* 1.91* 1.95* 1.86*  CALCIUM 9.5 9.2 9.1 8.8* 8.8*   Liver Function Tests:  Recent Labs Lab 03/28/15 2226  AST 65*  ALT 142*  ALKPHOS 130*  BILITOT 1.6*  PROT 8.4*  ALBUMIN 3.4*   CBC:  Recent Labs Lab 03/28/15 2226 03/29/15 0538 03/30/15 0421  WBC 13.8* 14.5* 12.2*  NEUTROABS 10.9*  --   --   HGB 15.7* 14.7 14.2  HCT 48.2* 45.5 43.3  MCV 83.8 84.7 83.9  PLT 236 218 PLATELET CLUMPS NOTED ON SMEAR, UNABLE TO ESTIMATE   CBG:  Recent Labs Lab 03/29/15 1455 03/29/15 1726 03/29/15 2034 03/30/15 0808 03/30/15 1115  GLUCAP 133* 184* 270* 275* 434*  434*    Scheduled Meds: . aspirin  325 mg Oral Daily  . carvedilol  6.25 mg Oral BID WC  . cefTRIAXone (ROCEPHIN)  IV  1 g Intravenous Q24H  .  feeding supplement  1 Container Oral BID BM  . fluconazole (DIFLUCAN) IV  100 mg Intravenous Q24H  . heparin subcutaneous  5,000 Units Subcutaneous Q12H  . insulin aspart  0-15 Units Subcutaneous TID WC  . insulin aspart  5 Units Subcutaneous TID WC  . insulin glargine  30 Units Subcutaneous QHS  . levothyroxine  100 mcg Intravenous Daily  . mercaptopurine  50 mg Oral Q48H  . nystatin  5 mL Oral QID  . predniSONE  20 mg Oral Q breakfast   Continuous Infusions: . dextrose 75 mL/hr at 03/30/15 0024

## 2015-03-31 LAB — BASIC METABOLIC PANEL
Anion gap: 9 (ref 5–15)
BUN: 29 mg/dL — AB (ref 6–20)
CALCIUM: 8.5 mg/dL — AB (ref 8.9–10.3)
CHLORIDE: 115 mmol/L — AB (ref 101–111)
CO2: 25 mmol/L (ref 22–32)
CREATININE: 1.41 mg/dL — AB (ref 0.44–1.00)
GFR calc Af Amer: 47 mL/min — ABNORMAL LOW (ref >60)
GFR calc non Af Amer: 40 mL/min — ABNORMAL LOW (ref >60)
GLUCOSE: 221 mg/dL — AB (ref 65–99)
Potassium: 4.4 mmol/L (ref 3.5–5.1)
SODIUM: 149 mmol/L — AB (ref 135–145)

## 2015-03-31 LAB — GLUCOSE, CAPILLARY
GLUCOSE-CAPILLARY: 239 mg/dL — AB (ref 65–99)
Glucose-Capillary: 178 mg/dL — ABNORMAL HIGH (ref 65–99)
Glucose-Capillary: 234 mg/dL — ABNORMAL HIGH (ref 65–99)
Glucose-Capillary: 195 mg/dL — ABNORMAL HIGH (ref 65–99)
Glucose-Capillary: 293 mg/dL — ABNORMAL HIGH (ref 65–99)

## 2015-03-31 LAB — CBC
HEMATOCRIT: 38.3 % (ref 36.0–46.0)
HEMOGLOBIN: 12.4 g/dL (ref 12.0–15.0)
MCH: 27.5 pg (ref 26.0–34.0)
MCHC: 32.4 g/dL (ref 30.0–36.0)
MCV: 84.9 fL (ref 78.0–100.0)
Platelets: 124 10*3/uL — ABNORMAL LOW (ref 150–400)
RBC: 4.51 MIL/uL (ref 3.87–5.11)
RDW: 19 % — AB (ref 11.5–15.5)
WBC: 8.3 10*3/uL (ref 4.0–10.5)

## 2015-03-31 MED ORDER — INSULIN GLARGINE 100 UNIT/ML ~~LOC~~ SOLN
32.0000 [IU] | Freq: Every day | SUBCUTANEOUS | Status: DC
  Administered 2015-03-31: 32 [IU] via SUBCUTANEOUS
  Filled 2015-03-31 (×2): qty 0.32

## 2015-03-31 NOTE — Progress Notes (Signed)
Patient ID: Dana Thornton, female   DOB: [birth year], 59 y.o.   MRN: [id number]  [facility] PROGRESS NOTE  [name]:[id number] DOB: 1956/03/29 DOA: 03/28/2015 PCP: Astrid Divine, MD   Brief narrative:    In brief, pt is 59 yo female who presented with N/V, abd pain, blurry vision, CBG > 400.  Assessment/Plan:    1. Uncontrolled DM 2 with complications of gastroparesis  - has been off insulin drip since 7/19, increased the lantus dose to 32 U with meal coverage of 5 U, diabetic educator consulted and assistance is appreciated  2. Hypernatremia - still volume depleted, sto IVF to see how pt does, repeat BMP in AM 3. Thrush - due to steroids/ uncont DM2.Placed on Diflucan and overall better. Pt tolerating diet well 4. Acute renal failure - pre renal, IVF have been provided and Cr is trending down, repeat BMP in AM 5. Leukocytosis - steroid induced, ? UTI, placed on empiric Rocephin for now, follow up on urine cultures> WBC WNL this AM. 6. UTI - started empiric Rocephin, follow up on urine cx, readjust the ABX regimen as indicated, WBC WNL 7. Transaminitis - pt with known history of autoimmune hepatitis, will repeat CMET in AM. Continue mercaptopurine  8. Morbid obesity - Body mass index is 43.52 kg/(m^2) 9. Severe PCM - in the context of acute illness, appreciate nutritionist consultation   DVT prophylaxis - Heparin SQ  Code Status: Full.  Family Communication:  plan of care discussed with the patient and husband at bedside  Disposition Plan: Home when stable.   IV access:  Peripheral IV  Procedures and diagnostic studies:    None   Medical Consultants:  None   Other Consultants:  DM  IAnti-Infectives:   Rocephin 7/19 -->  Debbora Presto, MD  [facility] Pager [phone]  If 7PM-7AM, please contact night-coverage [email or web address] Password [facility] 03/31/2015, 12:32 PM   LOS: 2 days   HPI/Subjective: No events overnight.   Objective: Filed  Vitals:   03/30/15 1818 03/30/15 2120 03/31/15 0442 03/31/15 0932  BP: 129/77 117/71 118/61 119/66  Pulse: 65 86 66 76  Temp: 98.2 F (36.8 C) 98.2 F (36.8 C) 98.4 F (36.9 C) 98.2 F (36.8 C)  TempSrc: Oral Oral Oral Oral  Resp: Height:      Weight:  108.4 kg (238 lb 15.7 oz)    SpO2: 98% 93% 100% 98%    Intake/Output Summary (Last 24 hours) at 03/31/15 1232 Last data filed at 03/31/15 0900  Gross per 24 hour  Intake   1712 ml  Output    250 ml  Net   1462 ml    Exam:   General:  Pt is alert, follows commands appropriately, not in acute distress, oral thrush improving   Cardiovascular: Regular rhythm, bradycardia, S1/S2, no murmurs, no rubs, no gallops  Respiratory: Clear to auscultation bilaterally, no wheezing, no crackles, no rhonchi  Abdomen: Soft, non tender, non distended, bowel sounds present, no guarding  Extremities: No edema, pulses DP and PT palpable bilaterally  Neuro: Grossly nonfocal  Data Reviewed: Basic Metabolic Panel:  Recent Labs Lab 03/29/15 0538 03/29/15 1420 03/29/15 1703 03/30/15 0421 03/31/15 0459  NA 159* 159* 154* 153* 149*  K 4.0 4.0 4.3 4.6 4.4  CL 124* 125* 120* 117* 115*  CO2 GLUCOSE 285* 169* 204* 315* 221*  BUN 51* 46* 45* 44* 29*  CREATININE 2.26* 1.91* 1.95* 1.86* 1.41*  CALCIUM 9.2 9.1 8.8* 8.8* 8.5*   Liver Function Tests:  Recent Labs Lab 03/28/15 2226  AST 65*  ALT 142*  ALKPHOS 130*  BILITOT 1.6*  PROT 8.4*  ALBUMIN 3.4*   CBC:  Recent Labs Lab 03/28/15 2226 03/29/15 0538 03/30/15 0421 03/31/15 0459  WBC 13.8* 14.5* 12.2* 8.3  NEUTROABS 10.9*  --   --   --   HGB 15.7* 14.7 14.2 12.4  HCT 48.2* 45.5 43.3 38.3  MCV 83.8 84.7 83.9 84.9  PLT 236 218 PLATELET CLUMPS NOTED ON SMEAR, UNABLE TO ESTIMATE 124*   CBG:  Recent Labs Lab 03/30/15 1655 03/30/15 2117 03/31/15 0121 03/31/15 0734 03/31/15 1130  GLUCAP 248* 76 178* 234* 195*    Scheduled Meds: . aspirin   325 mg Oral Daily  . carvedilol  6.25 mg Oral BID WC  . cefTRIAXone (ROCEPHIN)  IV  1 g Intravenous Q24H  . feeding supplement  1 Container Oral BID BM  . fluconazole (DIFLUCAN) IV  100 mg Intravenous Q24H  . heparin subcutaneous  5,000 Units Subcutaneous Q12H  . insulin aspart  0-15 Units Subcutaneous TID WC  . insulin aspart  5 Units Subcutaneous TID WC  . insulin glargine  32 Units Subcutaneous QHS  . levothyroxine  200 mcg Oral QAC breakfast  . mercaptopurine  50 mg Oral Q48H  . nystatin  5 mL Oral QID  . predniSONE  20 mg Oral Q breakfast   Continuous Infusions:

## 2015-04-01 LAB — URINE CULTURE

## 2015-04-01 LAB — BASIC METABOLIC PANEL
Anion gap: 9 (ref 5–15)
BUN: 18 mg/dL (ref 6–20)
CHLORIDE: 114 mmol/L — AB (ref 101–111)
CO2: 26 mmol/L (ref 22–32)
CREATININE: 1.26 mg/dL — AB (ref 0.44–1.00)
Calcium: 8.6 mg/dL — ABNORMAL LOW (ref 8.9–10.3)
GFR calc Af Amer: 53 mL/min — ABNORMAL LOW (ref >60)
GFR calc non Af Amer: 46 mL/min — ABNORMAL LOW (ref >60)
Glucose, Bld: 250 mg/dL — ABNORMAL HIGH (ref 65–99)
Potassium: 3.9 mmol/L (ref 3.5–5.1)
SODIUM: 149 mmol/L — AB (ref 135–145)

## 2015-04-01 LAB — CBC
HCT: 37.7 % (ref 36.0–46.0)
Hemoglobin: 12.4 g/dL (ref 12.0–15.0)
MCH: 27.3 pg (ref 26.0–34.0)
MCHC: 32.9 g/dL (ref 30.0–36.0)
MCV: 83 fL (ref 78.0–100.0)
PLATELETS: 119 10*3/uL — AB (ref 150–400)
RBC: 4.54 MIL/uL (ref 3.87–5.11)
RDW: 18.8 % — AB (ref 11.5–15.5)
WBC: 5.5 10*3/uL (ref 4.0–10.5)

## 2015-04-01 LAB — GLUCOSE, CAPILLARY: Glucose-Capillary: 229 mg/dL — ABNORMAL HIGH (ref 65–99)

## 2015-04-01 MED ORDER — FLUCONAZOLE 100 MG PO TABS: 100.0000 mg | ORAL_TABLET | Freq: Every day | ORAL | Status: DC

## 2015-04-01 MED ORDER — INSULIN GLARGINE 100 UNIT/ML ~~LOC~~ SOLN: 32.0000 [IU] | Freq: Every day | SUBCUTANEOUS | Status: DC

## 2015-04-01 NOTE — Progress Notes (Signed)
Discharged and discharged intructioon printed explained and given to patient.Questions answered to the patient's satisfaction.Reminded patient to follow -up with her medical appointment.

## 2015-04-01 NOTE — Care Management Note (Signed)
Case Management Note  Patient Details  Name: Dana Thornton MRN: [id number] Date of Birth: Jul 15, 1956  Subjective/Objective:           CM following for progression and d/c planning.         Action/Plan: No d/c needs identified.  Expected Discharge Date:       04/01/2015           Expected Discharge Plan:  Home/Self Care  In-House Referral:  NA  Discharge planning Services  NA  Post Acute Care Choice:  NA Choice offered to:  NA  DME Arranged:    DME Agency:     HH Arranged:    HH Agency:     Status of Service:  Completed, signed off  Medicare Important Message Given:    Date Medicare IM Given:    Medicare IM give by:    Date Additional Medicare IM Given:    Additional Medicare Important Message give by:     If discussed at Long Length of Stay Meetings, dates discussed:    Additional Comments:  Starlyn Skeans, RN 04/01/2015, 3:54 PM

## 2015-04-01 NOTE — Discharge Summary (Addendum)
Physician Discharge Summary  Dana Thornton:[id number] DOB: 07/10/1956 DOA: 03/28/2015  PCP: Astrid Divine, MD  Admit date: 03/28/2015 Discharge date: 04/01/2015  Recommendations for Outpatient Follow-up:  1. Pt will need to follow up with PCP in 2-3 weeks post discharge 2. Please obtain BMP to evaluate electrolytes and kidney function, sodium level  3. Please also check CBC to evaluate Hg and Hct levels  Discharge Diagnoses:  Principal Problem:   Diabetes type 2, uncontrolled Active Problems:   Autoimmune hepatitis   Hypernatremia   HTN (hypertension)   Vol depletion   Oral thrush   Poor appetite   Uncontrolled diabetes mellitus type 2 without complications   Protein-calorie malnutrition, severe   Discharge Condition: Stable  Diet recommendation: Heart healthy diet discussed in details     Brief narrative:    In brief, pt is 59 yo female who presented with N/V, abd pain, blurry vision, CBG > 400.  Assessment/Plan:    1. Uncontrolled DM 2 with complications of gastroparesis - continue home medical regimen upon discharge  2. Hypernatremia - still high this AM but pt looks well this AM, made aware she needs to have Na level checked on next visit with PCP and pt has verbalized understanding  3. Thrush - due to steroids/ uncont DM2.Placed on Diflucan and overall better. Pt tolerating diet well 4. Acute renal failure - pre renal, IVF have been provided and Cr is trending down 5. Leukocytosis - steroid induced, ? UTI, placed on empiric Rocephin, completed therapy while inpatient  6. UTI - started empiric Rocephin and therapy completed while inpatient  7. Transaminitis - pt with known history of autoimmune hepatitis 8. Morbid obesity - Body mass index is 43.52 kg/(m^2) 9. Severe PCM - in the context of acute illness, appreciate nutritionist consultation  Code Status: Full.  Family Communication: plan of care discussed with the patient at bedside   Disposition Plan: Home   IV access:  Peripheral IV  Procedures and diagnostic studies:   None   Medical Consultants:  None   Other Consultants:  DM  IAnti-Infectives:   Rocephin 7/19 --> 7/22      Discharge Exam: Filed Vitals:   04/01/15 0405  BP: 123/76  Pulse: 75  Temp: 97.8 F (36.6 C)  Resp: 18   Filed Vitals:   03/31/15 0932 03/31/15 1811 03/31/15 2135 04/01/15 0405  BP: 119/66 98/46 95/44  123/76  Pulse: 76 88 75 75  Temp: 98.2 F (36.8 C) 97.9 F (36.6 C) 98.2 F (36.8 C) 97.8 F (36.6 C)  TempSrc: Oral Oral Oral Oral  Resp: Height:      Weight:    109.997 kg (242 lb 8 oz)  SpO2: 98% 100% 98% 94%    General: Pt is alert, follows commands appropriately, not in acute distress Cardiovascular: Regular rate and rhythm, no rubs, no gallops Respiratory: Clear to auscultation bilaterally, no wheezing, no crackles, no rhonchi Abdominal: Soft, non tender, non distended, bowel sounds +, no guarding  Discharge Instructions  Discharge Instructions    Diet - low sodium heart healthy    Complete by:  As directed      Increase activity slowly    Complete by:  As directed             Medication List    STOP taking these medications        amLODipine 10 MG tablet  Commonly known as:  NORVASC     quinapril 10 MG  tablet  Commonly known as:  ACCUPRIL      TAKE these medications        aluminum-magnesium hydroxide-simethicone 200-200-20 MG/5ML Susp  Commonly known as:  MAALOX  Take 30 mLs by mouth 2 (two) times daily.     aspirin 325 MG tablet  Take 325 mg by mouth daily.     calcium elemental as carbonate 400 MG tablet  Commonly known as:  BARIATRIC TUMS ULTRA  Chew 1,000 mg by mouth daily.     carvedilol 6.25 MG tablet  Commonly known as:  COREG  Take 6.25 mg by mouth 2 (two) times daily with a meal.     fluconazole 100 MG tablet  Commonly known as:  DIFLUCAN  Take 1 tablet (100 mg total) by mouth daily.      insulin aspart 100 UNIT/ML injection  Commonly known as:  novoLOG  Before each meal 3 times a day, 120-150 - 2 units, 151-200 - 4 units, 201-250 - 8 units, 251-300 - 10 units,  301 or above 10 units and call your MD     insulin glargine 100 UNIT/ML injection  Commonly known as:  LANTUS  Inject 0.32 mLs (32 Units total) into the skin at bedtime.     levothyroxine 200 MCG tablet  Commonly known as:  SYNTHROID, LEVOTHROID  Take 200 mcg by mouth daily before breakfast.     mercaptopurine 50 MG tablet  Commonly known as:  PURINETHOL  Take 50 mg by mouth daily. Give on an empty stomach 1 hour before or 2 hours after meals. Caution: Chemotherapy.     ondansetron 4 MG disintegrating tablet  Commonly known as:  ZOFRAN-ODT  Take 4 mg by mouth every 8 (eight) hours as needed for nausea or vomiting.     predniSONE 20 MG tablet  Commonly known as:  DELTASONE  Take 60 mg by mouth daily with breakfast.            Follow-up Information    Follow up with Astrid Divine, MD.   Specialty:  Family Medicine   Contact information:   [address] [place] [address] [phone]       Call Debbora Presto, MD.   Specialty:  Internal Medicine   Why:  As needed call my cell phone [phone]   Contact information:   [address] [place] [address] [phone]        The results of significant diagnostics from this hospitalization (including imaging, microbiology, ancillary and laboratory) are listed below for reference.     Microbiology: No results found for this or any previous visit (from the past 240 hour(s)).   Labs: Basic Metabolic Panel:  Recent Labs Lab 03/29/15 1420 03/29/15 1703 03/30/15 0421 03/31/15 0459 04/01/15 0551  NA 159* 154* 153* 149* 149*  K 4.0 4.3 4.6 4.4 3.9  CL 125* 120* 117* 115* 114*  CO2 27 28 26 25 26   GLUCOSE 169* 204* 315* 221* 250*  BUN 46* 45* 44* 29* 18  CREATININE 1.91* 1.95* 1.86*  1.41* 1.26*  CALCIUM 9.1 8.8* 8.8* 8.5* 8.6*   Liver Function Tests:  Recent Labs Lab 03/28/15 2226  AST 65*  ALT 142*  ALKPHOS 130*  BILITOT 1.6*  PROT 8.4*  ALBUMIN 3.4*   CBC:  Recent Labs Lab 03/28/15 2226 03/29/15 0538 03/30/15 0421 03/31/15 0459 04/01/15 0551  WBC 13.8* 14.5* 12.2* 8.3 5.5  NEUTROABS 10.9*  --   --   --   --  HGB 15.7* 14.7 14.2 12.4 12.4  HCT 48.2* 45.5 43.3 38.3 37.7  MCV 83.8 84.7 83.9 84.9 83.0  PLT 236 218 PLATELET CLUMPS NOTED ON SMEAR, UNABLE TO ESTIMATE 124* 119*   CBG:  Recent Labs Lab 03/31/15 0734 03/31/15 1130 03/31/15 1630 03/31/15 2140 04/01/15 0727  GLUCAP 234* 195* 239* 293* 229*     SIGNED: Time coordinating discharge: 30 minutes  MAGICK-MYERS, ISKRA, MD  [facility] 04/01/2015, 9:53 AM Pager [phone]  If 7PM-7AM, please contact night-coverage [email or web address] Password [email or web address]

## 2015-04-01 NOTE — Progress Notes (Signed)
Discharged at 1100 a.m,called for two volunteers  For discharge/transport assist,patient decided to leave without the nurse knowing it,Come to know when volunteers came to floor at 1110 a.m. Patient and her children were gone from the room.

## 2015-04-01 NOTE — Discharge Instructions (Signed)
Dehydration, Adult Dehydration is when you lose more fluids from the body than you take in. Vital organs like the kidneys, brain, and heart cannot function without a proper amount of fluids and salt. Any loss of fluids from the body can cause dehydration.  CAUSES   Vomiting.  Diarrhea.  Excessive sweating.  Excessive urine output.  Fever. SYMPTOMS  Mild dehydration  Thirst.  Dry lips.  Slightly dry mouth. Moderate dehydration  Very dry mouth.  Sunken eyes.  Skin does not bounce back quickly when lightly pinched and released.  Dark urine and decreased urine production.  Decreased tear production.  Headache. Severe dehydration  Very dry mouth.  Extreme thirst.  Rapid, weak pulse (more than 100 beats per minute at rest).  Cold hands and feet.  Not able to sweat in spite of heat and temperature.  Rapid breathing.  Blue lips.  Confusion and lethargy.  Difficulty being awakened.  Minimal urine production.  No tears. DIAGNOSIS  Your caregiver will diagnose dehydration based on your symptoms and your exam. Blood and urine tests will help confirm the diagnosis. The diagnostic evaluation should also identify the cause of dehydration. TREATMENT  Treatment of mild or moderate dehydration can often be done at home by increasing the amount of fluids that you drink. It is best to drink small amounts of fluid more often. Drinking too much at one time can make vomiting worse. Refer to the home care instructions below. Severe dehydration needs to be treated at the hospital where you will probably be given intravenous (IV) fluids that contain water and electrolytes. HOME CARE INSTRUCTIONS   Ask your caregiver about specific rehydration instructions.  Drink enough fluids to keep your urine clear or pale yellow.  Drink small amounts frequently if you have nausea and vomiting.  Eat as you normally do.  Avoid:  Foods or drinks high in sugar.  Carbonated  drinks.  Juice.  Extremely hot or cold fluids.  Drinks with caffeine.  Fatty, greasy foods.  Alcohol.  Tobacco.  Overeating.  Gelatin desserts.  Wash your hands well to avoid spreading bacteria and viruses.  Only take over-the-counter or prescription medicines for pain, discomfort, or fever as directed by your caregiver.  Ask your caregiver if you should continue all prescribed and over-the-counter medicines.  Keep all follow-up appointments with your caregiver. SEEK MEDICAL CARE IF:  You have abdominal pain and it increases or stays in one area (localizes).  You have a rash, stiff neck, or severe headache.  You are irritable, sleepy, or difficult to awaken.  You are weak, dizzy, or extremely thirsty. SEEK IMMEDIATE MEDICAL CARE IF:   You are unable to keep fluids down or you get worse despite treatment.  You have frequent episodes of vomiting or diarrhea.  You have blood or green matter (bile) in your vomit.  You have blood in your stool or your stool looks black and tarry.  You have not urinated in 6 to 8 hours, or you have only urinated a small amount of very dark urine.  You have a fever.  You faint. MAKE SURE YOU:   Understand these instructions.  Will watch your condition.  Will get help right away if you are not doing well or get worse. Document Released: 08/27/2005 Document Revised: 11/19/2011 Document Reviewed: 04/16/2011 Exit[employer] Patient Information 2015 [employer]. This information is not intended to replace advice given to you by your health care provider. Make sure you discuss any questions you have with your health care   provider.  

## 2015-04-03 MED ORDER — NYSTATIN 100000 UNIT/ML MT SUSP: 5.0000 mL | Freq: Four times a day (QID) | OROMUCOSAL | Status: DC

## 2015-04-09 ENCOUNTER — Emergency Department (HOSPITAL_COMMUNITY): Payer: 59

## 2015-04-09 ENCOUNTER — Inpatient Hospital Stay (HOSPITAL_COMMUNITY)
Admission: EM | Admit: 2015-04-09 | Discharge: 2015-04-13 | DRG: 637 | Disposition: A | Discharge status: Home / Self Care | Payer: 59 | Attending: Internal Medicine | Admitting: Internal Medicine

## 2015-04-09 ENCOUNTER — Encounter (HOSPITAL_COMMUNITY): Payer: Self-pay | Admitting: *Deleted

## 2015-04-09 DIAGNOSIS — R739 Hyperglycemia, unspecified: Secondary | ICD-10-CM | POA: Diagnosis not present

## 2015-04-09 DIAGNOSIS — R0902 Hypoxemia: Secondary | ICD-10-CM | POA: Diagnosis present

## 2015-04-09 DIAGNOSIS — E860 Dehydration: Secondary | ICD-10-CM | POA: Diagnosis present

## 2015-04-09 DIAGNOSIS — E039 Hypothyroidism, unspecified: Secondary | ICD-10-CM | POA: Diagnosis present

## 2015-04-09 DIAGNOSIS — I959 Hypotension, unspecified: Secondary | ICD-10-CM | POA: Diagnosis present

## 2015-04-09 DIAGNOSIS — E780 Pure hypercholesterolemia: Secondary | ICD-10-CM | POA: Diagnosis present

## 2015-04-09 DIAGNOSIS — K7200 Acute and subacute hepatic failure without coma: Secondary | ICD-10-CM | POA: Diagnosis present

## 2015-04-09 DIAGNOSIS — B370 Candidal stomatitis: Secondary | ICD-10-CM | POA: Diagnosis present

## 2015-04-09 DIAGNOSIS — K759 Inflammatory liver disease, unspecified: Secondary | ICD-10-CM

## 2015-04-09 DIAGNOSIS — R740 Nonspecific elevation of levels of transaminase and lactic acid dehydrogenase [LDH]: Secondary | ICD-10-CM | POA: Diagnosis present

## 2015-04-09 DIAGNOSIS — I10 Essential (primary) hypertension: Secondary | ICD-10-CM | POA: Diagnosis present

## 2015-04-09 DIAGNOSIS — E872 Acidosis, unspecified: Secondary | ICD-10-CM | POA: Diagnosis present

## 2015-04-09 DIAGNOSIS — Z9119 Patient's noncompliance with other medical treatment and regimen: Secondary | ICD-10-CM | POA: Diagnosis present

## 2015-04-09 DIAGNOSIS — E861 Hypovolemia: Secondary | ICD-10-CM | POA: Diagnosis present

## 2015-04-09 DIAGNOSIS — K754 Autoimmune hepatitis: Secondary | ICD-10-CM | POA: Diagnosis present

## 2015-04-09 DIAGNOSIS — B3781 Candidal esophagitis: Secondary | ICD-10-CM | POA: Diagnosis present

## 2015-04-09 DIAGNOSIS — Z6841 Body Mass Index (BMI) 40.0 and over, adult: Secondary | ICD-10-CM | POA: Diagnosis not present

## 2015-04-09 DIAGNOSIS — E870 Hyperosmolality and hypernatremia: Secondary | ICD-10-CM | POA: Diagnosis present

## 2015-04-09 DIAGNOSIS — T380X5A Adverse effect of glucocorticoids and synthetic analogues, initial encounter: Secondary | ICD-10-CM | POA: Diagnosis present

## 2015-04-09 DIAGNOSIS — E162 Hypoglycemia, unspecified: Secondary | ICD-10-CM | POA: Diagnosis not present

## 2015-04-09 DIAGNOSIS — E1310 Other specified diabetes mellitus with ketoacidosis without coma: Principal | ICD-10-CM | POA: Diagnosis present

## 2015-04-09 DIAGNOSIS — E1110 Type 2 diabetes mellitus with ketoacidosis without coma: Secondary | ICD-10-CM | POA: Diagnosis present

## 2015-04-09 DIAGNOSIS — R7401 Elevation of levels of liver transaminase levels: Secondary | ICD-10-CM

## 2015-04-09 DIAGNOSIS — E1165 Type 2 diabetes mellitus with hyperglycemia: Secondary | ICD-10-CM | POA: Diagnosis not present

## 2015-04-09 DIAGNOSIS — G9340 Encephalopathy, unspecified: Secondary | ICD-10-CM | POA: Diagnosis present

## 2015-04-09 DIAGNOSIS — R9431 Abnormal electrocardiogram [ECG] [EKG]: Secondary | ICD-10-CM | POA: Diagnosis present

## 2015-04-09 DIAGNOSIS — N179 Acute kidney failure, unspecified: Secondary | ICD-10-CM | POA: Diagnosis present

## 2015-04-09 LAB — CBC WITH DIFFERENTIAL/PLATELET
BASOS ABS: 0.1 10*3/uL (ref 0.0–0.1)
Basophils Relative: 1 % (ref 0–1)
EOS ABS: 0 10*3/uL (ref 0.0–0.7)
EOS PCT: 1 % (ref 0–5)
HCT: 46.3 % — ABNORMAL HIGH (ref 36.0–46.0)
HEMOGLOBIN: 15.1 g/dL — AB (ref 12.0–15.0)
LYMPHS ABS: 2.3 10*3/uL (ref 0.7–4.0)
Lymphocytes Relative: 29 % (ref 12–46)
MCH: 27.9 pg (ref 26.0–34.0)
MCHC: 32.6 g/dL (ref 30.0–36.0)
MCV: 85.4 fL (ref 78.0–100.0)
MONO ABS: 0.7 10*3/uL (ref 0.1–1.0)
Monocytes Relative: 9 % (ref 3–12)
Neutro Abs: 4.8 10*3/uL (ref 1.7–7.7)
Neutrophils Relative %: 61 % (ref 43–77)
Platelets: 194 10*3/uL (ref 150–400)
RBC: 5.42 MIL/uL — ABNORMAL HIGH (ref 3.87–5.11)
RDW: 20.8 % — ABNORMAL HIGH (ref 11.5–15.5)
WBC: 7.9 10*3/uL (ref 4.0–10.5)

## 2015-04-09 LAB — URINE MICROSCOPIC-ADD ON

## 2015-04-09 LAB — COMPREHENSIVE METABOLIC PANEL
ALT: 1250 U/L — ABNORMAL HIGH (ref 14–54)
AST: 829 U/L — AB (ref 15–41)
Albumin: 3 g/dL — ABNORMAL LOW (ref 3.5–5.0)
Alkaline Phosphatase: 236 U/L — ABNORMAL HIGH (ref 38–126)
Anion gap: 16 — ABNORMAL HIGH (ref 5–15)
BILIRUBIN TOTAL: 2.4 mg/dL — AB (ref 0.3–1.2)
BUN: 24 mg/dL — ABNORMAL HIGH (ref 6–20)
CHLORIDE: 113 mmol/L — AB (ref 101–111)
CO2: 24 mmol/L (ref 22–32)
Calcium: 9.4 mg/dL (ref 8.9–10.3)
Creatinine, Ser: 2.78 mg/dL — ABNORMAL HIGH (ref 0.44–1.00)
GFR calc Af Amer: 20 mL/min — ABNORMAL LOW (ref >60)
GFR, EST NON AFRICAN AMERICAN: 18 mL/min — AB (ref >60)
Glucose, Bld: 685 mg/dL (ref 65–99)
POTASSIUM: 4.8 mmol/L (ref 3.5–5.1)
SODIUM: 153 mmol/L — AB (ref 135–145)
Total Protein: 7.9 g/dL (ref 6.5–8.1)

## 2015-04-09 LAB — GLUCOSE, CAPILLARY: Glucose-Capillary: 300 mg/dL — ABNORMAL HIGH (ref 65–99)

## 2015-04-09 LAB — URINALYSIS, ROUTINE W REFLEX MICROSCOPIC
Glucose, UA: >1000 mg/dL — AB
Hgb urine dipstick: NEGATIVE
Ketones, ur: NEGATIVE mg/dL
Leukocytes, UA: NEGATIVE
Nitrite: NEGATIVE
PH: 5 (ref 5.0–8.0)
Protein, ur: 30 mg/dL — AB
SPECIFIC GRAVITY, URINE: 1.021 (ref 1.005–1.030)
Urobilinogen, UA: 1 mg/dL (ref 0.0–1.0)

## 2015-04-09 LAB — CBG MONITORING, ED
GLUCOSE-CAPILLARY: 424 mg/dL — AB (ref 65–99)
GLUCOSE-CAPILLARY: 280 mg/dL — AB (ref 65–99)
Glucose-Capillary: 586 mg/dL (ref 65–99)

## 2015-04-09 LAB — I-STAT TROPONIN, ED: Troponin i, poc: 0.02 ng/mL (ref 0.00–0.08)

## 2015-04-09 LAB — CG4 I-STAT (LACTIC ACID): LACTIC ACID, VENOUS: 3.34 mmol/L — AB (ref 0.5–2.0)

## 2015-04-09 LAB — AMMONIA: Ammonia: 44 umol/L — ABNORMAL HIGH (ref 9–35)

## 2015-04-09 LAB — BETA-HYDROXYBUTYRIC ACID: BETA-HYDROXYBUTYRIC ACID: 0.45 mmol/L — AB (ref 0.05–0.27)

## 2015-04-09 LAB — LIPASE, BLOOD: Lipase: 35 U/L (ref 22–51)

## 2015-04-09 LAB — I-STAT CG4 LACTIC ACID, ED: Lactic Acid, Venous: 4.97 mmol/L (ref 0.5–2.0)

## 2015-04-09 MED ORDER — SODIUM CHLORIDE 0.9 % IV BOLUS (SEPSIS)
2000.0000 mL | Freq: Once | INTRAVENOUS | Status: AC
  Administered 2015-04-09: 2000 mL via INTRAVENOUS

## 2015-04-09 MED ORDER — CETYLPYRIDINIUM CHLORIDE 0.05 % MT LIQD
7.0000 mL | Freq: Two times a day (BID) | OROMUCOSAL | Status: DC
  Administered 2015-04-10 – 2015-04-13 (×8): 7 mL via OROMUCOSAL

## 2015-04-09 MED ORDER — NYSTATIN 100000 UNIT/ML MT SUSP
5.0000 mL | Freq: Four times a day (QID) | OROMUCOSAL | Status: DC
  Administered 2015-04-09 – 2015-04-13 (×14): 500000 [IU] via ORAL
  Filled 2015-04-09 (×15): qty 5

## 2015-04-09 MED ORDER — SODIUM CHLORIDE 0.9 % IV SOLN
INTRAVENOUS | Status: DC
  Administered 2015-04-09: 3.6 [IU]/h via INTRAVENOUS
  Filled 2015-04-09: qty 2.5

## 2015-04-09 MED ORDER — LEVOTHYROXINE SODIUM 200 MCG PO TABS
200.0000 ug | ORAL_TABLET | Freq: Every day | ORAL | Status: DC
  Administered 2015-04-10: 200 ug via ORAL
  Filled 2015-04-09 (×2): qty 1

## 2015-04-09 MED ORDER — SODIUM CHLORIDE 0.9 % IV SOLN: INTRAVENOUS | Status: DC

## 2015-04-09 MED ORDER — SODIUM CHLORIDE 0.9 % IV BOLUS (SEPSIS)
1000.0000 mL | Freq: Once | INTRAVENOUS | Status: AC
  Administered 2015-04-09: 1000 mL via INTRAVENOUS

## 2015-04-09 MED ORDER — SODIUM CHLORIDE 0.9 % IJ SOLN
3.0000 mL | Freq: Two times a day (BID) | INTRAMUSCULAR | Status: DC
  Administered 2015-04-09 – 2015-04-12 (×6): 3 mL via INTRAVENOUS

## 2015-04-09 MED ORDER — HEPARIN SODIUM (PORCINE) 5000 UNIT/ML IJ SOLN
5000.0000 [IU] | Freq: Three times a day (TID) | INTRAMUSCULAR | Status: DC
  Administered 2015-04-09 – 2015-04-13 (×11): 5000 [IU] via SUBCUTANEOUS
  Filled 2015-04-09 (×13): qty 1

## 2015-04-09 MED ORDER — PREDNISONE 20 MG PO TABS
40.0000 mg | ORAL_TABLET | Freq: Every day | ORAL | Status: DC
  Administered 2015-04-10 – 2015-04-13 (×4): 40 mg via ORAL
  Filled 2015-04-09 (×6): qty 2

## 2015-04-09 MED ORDER — INSULIN ASPART 100 UNIT/ML ~~LOC~~ SOLN
10.0000 [IU] | Freq: Once | SUBCUTANEOUS | Status: AC
  Administered 2015-04-09: 10 [IU] via INTRAVENOUS
  Filled 2015-04-09: qty 1

## 2015-04-09 MED ORDER — FLUCONAZOLE IN SODIUM CHLORIDE 200-0.9 MG/100ML-% IV SOLN
200.0000 mg | INTRAVENOUS | Status: DC
  Administered 2015-04-10 – 2015-04-11 (×3): 200 mg via INTRAVENOUS
  Filled 2015-04-09 (×5): qty 100

## 2015-04-09 MED ORDER — ONDANSETRON HCL 4 MG/2ML IJ SOLN: 4.0000 mg | Freq: Four times a day (QID) | INTRAMUSCULAR | Status: DC | PRN

## 2015-04-09 MED ORDER — ONDANSETRON HCL 4 MG PO TABS: 4.0000 mg | ORAL_TABLET | Freq: Four times a day (QID) | ORAL | Status: DC | PRN

## 2015-04-09 MED ORDER — DEXTROSE-NACL 5-0.45 % IV SOLN
INTRAVENOUS | Status: DC
  Administered 2015-04-09: 23:00:00 via INTRAVENOUS

## 2015-04-09 MED ORDER — POTASSIUM CHLORIDE 10 MEQ/100ML IV SOLN
10.0000 meq | INTRAVENOUS | Status: AC
  Administered 2015-04-09 (×2): 10 meq via INTRAVENOUS
  Filled 2015-04-09 (×2): qty 100

## 2015-04-09 MED ORDER — SODIUM CHLORIDE 0.9 % IV SOLN
INTRAVENOUS | Status: AC
  Filled 2015-04-09: qty 2.5

## 2015-04-09 NOTE — ED Notes (Signed)
Pts BP decreased to 47/28, HR-104. Pt is A&Ox4, speaking in clear coherent sentences but states she is very weak, dizzy and tired. Pt is pale. 3L infusing now, two of which are infusing via pressure bags. Emilia Beck at bedside.

## 2015-04-09 NOTE — ED Provider Notes (Signed)
The patient is a 59 year old female, she has a known history of diabetes, she also has a known history of autoimmune hepatitis according to the husband and is frequently using prednisone to help with liver inflammation. She presents to the hospital today with severe hyperglycemia and altered mental status, she was hypotensive, tachycardic and obtunded on arrival.  The patient is somnolent, arousable to voice, follows commands, soft abdomen which is nontender, lung sounds are clear, heart sounds are fast, no murmurs. Tachycardia of 105, blood pressure was initially very very low but has improved with IV fluids. She has received 3 L of IV fluids, she is now receiving insulin IV and will need an insulin drip for diabetic ketoacidosis.   The patient is critically ill with diabetic ketoacidosis. She has a lactic acid of 5, blood sugar close to 600 admission to the hospital.  CRITICAL CARE Performed by: Vida Roller Total critical care time: 35 Critical care time was exclusive of separately billable procedures and treating other patients. Critical care was necessary to treat or prevent imminent or life-threatening deterioration. Critical care was time spent personally by me on the following activities: development of treatment plan with patient and/or surrogate as well as nursing, discussions with consultants, evaluation of patient's response to treatment, examination of patient, obtaining history from patient or surrogate, ordering and performing treatments and interventions, ordering and review of laboratory studies, ordering and review of radiographic studies, pulse oximetry and re-evaluation of patient's condition.   EKG Interpretation  Date/Time:  Saturday April 09 2015 17:05:48 EDT Ventricular Rate:  104 PR Interval:  124 QRS Duration: 75 QT Interval:  362 QTC Calculation: 476 R Axis:   53 Text Interpretation:  Sinus tachycardia Ventricular premature complex Low voltage, precordial leads  Nonspecific T abnormalities, diffuse leads Since last tracing rate slower Nonspecific T wave abnormality now present Abnormal ekg Confirmed by Hyacinth Meeker  MD, Montez Stryker ([id number]) on 04/09/2015 5:11:41 PM        Medical screening examination/treatment/procedure(s) were conducted as a shared visit with non-physician practitioner(s) and myself.  I personally evaluated the patient during the encounter.  Clinical Impression:   Final diagnoses:  Hypoxia  Diabetic ketoacidosis without coma associated with type 2 diabetes mellitus  Hepatitis          Eber Hong, MD 04/10/15 1353

## 2015-04-09 NOTE — ED Notes (Signed)
Dr Miller at bedside. 

## 2015-04-09 NOTE — H&P (Signed)
[facility] History and Physical  Dana Thornton:[id number] DOB: 11/11/1955 DOA: 04/09/2015   PCP: Osborne Casco, MD  Specialists: Followed by Dr. Amedeo Plenty with the gastroenterology for Autoimmune hepatitis  Chief Complaint: High blood sugars  HPI: Dana Thornton is a 59 y.o. female with a past medical history of diabetes on insulin, autoimmune hepatitis, and recently diagnosed oral thrush, hypertension, who was hospitalized recently, about a week ago for uncontrolled diabetes with hyperglycemia. She was discharged and continued on her home medication regimen. Patient is on steroids for autoimmune hepatitis for which she follows with Dr. Amedeo Plenty with gastroenterology. During this previous hospitalization she was also diagnosed with oral thrush and was given Diflucan and oral nystatin. Patient is accompanied by her husband who provided most of the history. After her discharge, patient was doing well for the initial 2-3 days, but then subsequently said started having poor oral intake. It would hurt whenever she would try to swallow. She was having gagging and dry heaves. She was not eating and drinking much. Her blood sugars started going up into the 500s. There was no change in the dose of her insulin. No syncope, but a lot of dizziness. No chest pain or shortness of breath. No abdominal pain or dysuria. No fever, but has been having some chills. Her symptoms persisted, so they decided to come into the hospital. In the emergency department, her blood pressure was initially in the 65K and 81E systolic. With aggressive hydration blood pressure stabilized. We were called for admission. She is on antihypertensives at home and had been taking all of her medications. During her last hospitalization she was asked to stop the amlodipine and quinapril at discharge, but it appears that she had been continuing to take them.  Home Medications: This list needs to be re-conciled.  Prior to  Admission medications   Medication Sig Start Date End Date Taking? Authorizing Provider  aspirin 325 MG tablet Take 325 mg by mouth daily.   Yes Historical Provider, MD  carvedilol (COREG) 6.25 MG tablet Take 6.25 mg by mouth 2 (two) times daily with a meal.   Yes Historical Provider, MD  insulin aspart (NOVOLOG) 100 UNIT/ML injection Before each meal 3 times a day, 120-150 - 2 units, 151-200 - 4 units, 201-250 - 8 units, 251-300 - 10 units,  301 or above 10 units and call your MD 12/10/11  Yes Thurnell Lose, MD  insulin glargine (LANTUS) 100 UNIT/ML injection Inject 0.32 mLs (32 Units total) into the skin at bedtime. 04/01/15  Yes Theodis Blaze, MD  levothyroxine (SYNTHROID, LEVOTHROID) 200 MCG tablet Take 200 mcg by mouth daily before breakfast.   Yes Historical Provider, MD  mercaptopurine (PURINETHOL) 50 MG tablet Take 50 mg by mouth daily. Give on an empty stomach 1 hour before or 2 hours after meals. Caution: Chemotherapy.   Yes Historical Provider, MD  nystatin (MYCOSTATIN) 100000 UNIT/ML suspension Take 5 mLs (500,000 Units total) by mouth 4 (four) times daily. 04/03/15  Yes Theodis Blaze, MD  predniSONE (DELTASONE) 20 MG tablet Take 60 mg by mouth daily with breakfast.   Yes Historical Provider, MD  fluconazole (DIFLUCAN) 100 MG tablet Take 1 tablet (100 mg total) by mouth daily. 04/01/15   Theodis Blaze, MD  ondansetron (ZOFRAN-ODT) 4 MG disintegrating tablet Take 4 mg by mouth every 8 (eight) hours as needed for nausea or vomiting.    Historical Provider, MD    Allergies:  Allergies  Allergen Reactions  .  Codeine Nausea And Vomiting    Past Medical History: Past Medical History  Diagnosis Date  . Hypertension   . Kidney stones   . Hypercholesterolemia   . Type II diabetes mellitus     Past Surgical History  Procedure Laterality Date  . Dilation and curettage of uterus      "I lost a baby"  . Cesarean section  1999  . Tubal ligation  1999    Social History: Lives in  [place] with her husband. No history of smoking, alcohol use or illicit drug use. Usually independent with daily activities.  Family History:  Family History  Problem Relation Age of Onset  . Kidney failure Father   . Hypertension Father      Review of Systems - unable to do due to acute illness and some confusion  Physical Examination  Filed Vitals:   04/09/15 1740 04/09/15 1800 04/09/15 1830 04/09/15 1900  BP: 106/63 113/90 109/72 117/69  Pulse: 99 93 92 88  Temp:      TempSrc:      Resp: '19 21 18 20  ' SpO2: 100% 100% 100% 98%    BP 117/69 mmHg  Pulse 88  Temp(Src) 97.2 F (36.2 C) (Rectal)  Resp 20  SpO2 98%  General appearance: alert, cooperative, appears stated age and no distress Head: Normocephalic, without obvious abnormality, atraumatic Eyes: conjunctivae/corneas clear. PERRL, EOM's intact.  Throat: lips, mucosa, and tongue normal; teeth and gums normal and No oral lesions noted Neck: no adenopathy, no carotid bruit, no JVD, supple, symmetrical, trachea midline and thyroid not enlarged, symmetric, no tenderness/mass/nodules Resp: Diminished air entry at the bases without any crackles or wheezing Cardio: regular rate and rhythm, S1, S2 normal, no murmur, click, rub or gallop GI: soft, non-tender; bowel sounds normal; no masses,  no organomegaly Extremities: extremities normal, atraumatic, no cyanosis or edema Pulses: 2+ and symmetric Skin: Skin color, texture, turgor normal. No rashes or lesions Lymph nodes: Cervical, supraclavicular, and axillary nodes normal. Neurologic: Alert. Slow to respond. Oriented to place, year, month person. No facial asymmetry. Motor strength equal bilateral upper and lower extremities. No asterixis.  Laboratory Data: Results for orders placed or performed during the hospital encounter of 04/09/15 (from the past 48 hour(s))  CBG monitoring, ED     Status: Abnormal   Collection Time: 04/09/15  4:26 PM  Result Value Ref Range    Glucose-Capillary 586 (HH) 65 - 99 mg/dL  CBC with Differential/Platelet     Status: Abnormal   Collection Time: 04/09/15  4:54 PM  Result Value Ref Range   WBC 7.9 4.0 - 10.5 K/uL   RBC 5.42 (H) 3.87 - 5.11 MIL/uL   Hemoglobin 15.1 (H) 12.0 - 15.0 g/dL   HCT 46.3 (H) 36.0 - 46.0 %   MCV 85.4 78.0 - 100.0 fL   MCH 27.9 26.0 - 34.0 pg   MCHC 32.6 30.0 - 36.0 g/dL   RDW 20.8 (H) 11.5 - 15.5 %   Platelets 194 150 - 400 K/uL   Neutrophils Relative % 61 43 - 77 %   Neutro Abs 4.8 1.7 - 7.7 K/uL   Lymphocytes Relative 29 12 - 46 %   Lymphs Abs 2.3 0.7 - 4.0 K/uL   Monocytes Relative 9 3 - 12 %   Monocytes Absolute 0.7 0.1 - 1.0 K/uL   Eosinophils Relative 1 0 - 5 %   Eosinophils Absolute 0.0 0.0 - 0.7 K/uL   Basophils Relative 1 0 - 1 %  Basophils Absolute 0.1 0.0 - 0.1 K/uL  Comprehensive metabolic panel     Status: Abnormal   Collection Time: 04/09/15  4:54 PM  Result Value Ref Range   Sodium 153 (H) 135 - 145 mmol/L   Potassium 4.8 3.5 - 5.1 mmol/L   Chloride 113 (H) 101 - 111 mmol/L   CO2 24 22 - 32 mmol/L   Glucose, Bld 685 (HH) 65 - 99 mg/dL    Comment: REPEATED TO VERIFY CRITICAL RESULT CALLED TO, READ BACK BY AND VERIFIED WITH: COMPTONMRN [id number] MCCAULEG    BUN 24 (H) 6 - 20 mg/dL   Creatinine, Ser 2.78 (H) 0.44 - 1.00 mg/dL   Calcium 9.4 8.9 - 10.3 mg/dL   Total Protein 7.9 6.5 - 8.1 g/dL   Albumin 3.0 (L) 3.5 - 5.0 g/dL   AST 829 (H) 15 - 41 U/L   ALT 1250 (H) 14 - 54 U/L   Alkaline Phosphatase 236 (H) 38 - 126 U/L   Total Bilirubin 2.4 (H) 0.3 - 1.2 mg/dL   GFR calc non Af Amer 18 (L) >60 mL/min   GFR calc Af Amer 20 (L) >60 mL/min    Comment: (NOTE) The eGFR has been calculated using the CKD EPI equation. This calculation has not been validated in all clinical situations. eGFR's persistently <60 mL/min signify possible Chronic Kidney Disease.    Anion gap 16 (H) 5 - 15  Lipase, blood     Status: None   Collection Time: 04/09/15  4:54 PM  Result Value  Ref Range   Lipase 35 22 - 51 U/L  I-Stat CG4 Lactic Acid, ED     Status: Abnormal   Collection Time: 04/09/15  5:01 PM  Result Value Ref Range   Lactic Acid, Venous 4.97 (HH) 0.5 - 2.0 mmol/L   Comment NOTIFIED PHYSICIAN   I-stat troponin, ED     Status: None   Collection Time: 04/09/15  5:19 PM  Result Value Ref Range   Troponin i, poc 0.02 0.00 - 0.08 ng/mL   Comment 3            Comment: Due to the release kinetics of cTnI, a negative result within the first hours of the onset of symptoms does not rule out myocardial infarction with certainty. If myocardial infarction is still suspected, repeat the test at appropriate intervals.   CBG monitoring, ED     Status: Abnormal   Collection Time: 04/09/15  5:37 PM  Result Value Ref Range   Glucose-Capillary 424 (H) 65 - 99 mg/dL  CBG monitoring, ED     Status: Abnormal   Collection Time: 04/09/15  7:02 PM  Result Value Ref Range   Glucose-Capillary 280 (H) 65 - 99 mg/dL   Comment 1 Notify RN     Radiology Reports: Dg Chest Port 1 View  04/09/2015   CLINICAL DATA:  Weakness and dizziness.  Low blood pressure.  EXAM: PORTABLE CHEST - 1 VIEW  COMPARISON:  None.  FINDINGS: Stable enlarged cardiac silhouette. There are low lung volumes and basilar atelectasis. No effusion, infiltrate, pneumothorax.  IMPRESSION: Cardiomegaly and low lung volumes.  No acute findings   Electronically Signed   By: Suzy Bouchard M.D.   On: 04/09/2015 17:16    My interpretation of Electrocardiogram: Sinus tachycardia at 104 bpm. Normal axis. Intervals are normal. Nonspecific T wave changes. No concerning ST changes.  Problem List  Principal Problem:   Hyperglycemia Active Problems:   Autoimmune hepatitis   Hypernatremia  DKA (diabetic ketoacidoses)   Hypotension   ARF (acute renal failure)   Transaminitis   Shock liver   Lactic acidosis   Assessment: This is a 59 year old African-American female brought into the hospital due to high blood  sugars at home. Patient was hypotensive when she initially arrived. Blood pressure have improved. She is noted to have severe hyperglycemia. Anion gap is mildly elevated, although her bicarbonate is normal. I think mostly this is a nonketotic state, or perhaps mild DKA. She is noted to have significantly elevated liver function tests. She is mildly confused without any focal deficits, which is likely due to her dehydrated state. Lactic acid is noted to be elevated, which is most likely due to hypovolemia rather than an infection.  Plan: #1 hyperglycemia, possible mild DKA: Checkup beta-hydroxy butyrate. We will place her on a DKA protocol. Aggressive IV hydration. IV insulin. Check HbA1c in the morning. Basic metabolic panel every 4 hours.  #2 transaminitis in the setting of known history of autoimmune hepatitis: LFTs are usually not this high. Last week, her AST was less than 100 ALT was in the early 100s. These numbers are more consistent with shock liver, considering the patient's blood pressure was so low when she initially arrived to the ED. She is on mercaptopurine as well as the prednisone at home for her autoimmune hepatitis. Discussed with Dr. Amedeo Plenty with gastroenterology who will see her tomorrow morning. For now, we will like to continue her on steroids. No need for stress dosing unless her blood pressure drops again. We will, however, hold her mercaptopurine. Check ultrasound the abdomen tomorrow morning. Hepatitis panel. Repeat LFTs. Check coag profile.  #3 Hypotension: Most likely due to dehydration and the fact that she was taking 3 different antihypertensives agents. Hold all her agents for now. Blood pressure has improved with IV fluids. Continue to monitor her closely. At this time it doesn't appear that she will need pressors. Also hold off on stress dose steroids for now.  #4 lactic acidosis: Most likely due to hypovolemia. No evidence for infection at this time. UA is pending. Chest  x-ray did not show any infiltrates. She is afebrile with a normal white count.  #5 Recent oral thrush: She reports odynophagia. Currently, there are no white plaques noted on her oral examination. We will place her on Diflucan. GI to consider endoscopy when she is more stable.  #6 acute encephalopathy: Most likely due to acute illness dehydrated state and hyperglycemia. She does not have any focal deficits. Ammonia level is pending. Continue to monitor closely with neuro checks.  #7 Abnormal EKG: Will repeat in the morning. Troponin is normal. Patient denies any chest pains.  #8 Hypernatremia: Recheck in the morning after hydration. Possibly due to dehydrated state.  #9 Acute renal failure: Most likely prerenal from hypovolemia and dehydration. Aggressively hydrate. Check UA. Repeat renal function in the morning. Monitor urine output.  #10 Morbid obesity: BMI is around 44   DVT Prophylaxis: Subcutaneous heparin Code Status: Full code.  Family Communication: Discussed with the patient and her husband  Disposition Plan: Admit to stepdown   Further management decisions will depend on results of further testing and patient's response to treatment.   [facility]  [facility] Pager [phone]  If 7PM-7AM, please contact night-coverage [email or web address] Password [facility]  04/09/2015, 7:24 PM

## 2015-04-09 NOTE — ED Notes (Signed)
Per pts family pt was released from the hospital on the 22nd. Pt was being treated for thrush. Pt has had decreased appetite and CBG reading "high". Pt has increased confusion and 'no energy".

## 2015-04-09 NOTE — ED Notes (Signed)
NOTIFIED DR.Fleet ContrasLLER FOR PATIENTS LAB RESULTS OF CG4+LACTIC ACID :10 PM .

## 2015-04-09 NOTE — ED Provider Notes (Signed)
CSN: [id number]     Arrival date & time 04/09/15  34 History   First MD Initiated Contact with Patient 04/09/15 1637     Chief Complaint  Patient presents with  . Altered Mental Status  . Hyperglycemia     (Consider location/radiation/quality/duration/timing/severity/associated sxs/prior Treatment) HPI Comments: Patient is a 59 year old female with a past medical history of type 2 diabetes, autoimmune hepatitis, and hypertension who presents with confusion and fatigue that started today. Patient reports being discharged from the hospital 8 days ago after being treated for oral thrush and hyperglycemia. Patient had been taking prednisone at home for her autoimmune hepatitis. Patient's blood glucose has been high than normal for the past few days and she has had decreased appetits but today her meter read "HIGH." Patient was having increased confusion and fatigue at home according to the husband. Her husband brought her back to the ED because she was feeling so bad. She thinks she has not been taking her Nystatin as directed. No illicit drugs, alcohol use, or pain medication used at home. Patient able to answer questions.    Past Medical History  Diagnosis Date  . Hypertension   . Kidney stones   . Hypercholesterolemia   . Type II diabetes mellitus    Past Surgical History  Procedure Laterality Date  . Dilation and curettage of uterus      "I lost a baby"  . Cesarean section  1999  . Tubal ligation  1999   No family history on file. History  Substance Use Topics  . Smoking status: Never Smoker   . Smokeless tobacco: Never Used  . Alcohol Use: No   OB History    No data available     Review of Systems  Constitutional: Positive for appetite change and fatigue.  Psychiatric/Behavioral: Positive for confusion.  All other systems reviewed and are negative.     Allergies  Codeine  Home Medications   Prior to Admission medications   Medication Sig Start Date End Date  Taking? Authorizing Provider  aluminum-magnesium hydroxide-simethicone (MAALOX) 409-811-91 MG/5ML SUSP Take 30 mLs by mouth 2 (two) times daily.    Historical Provider, MD  aspirin 325 MG tablet Take 325 mg by mouth daily.    Historical Provider, MD  calcium elemental as carbonate (BARIATRIC TUMS ULTRA) 400 MG tablet Chew 1,000 mg by mouth daily.    Historical Provider, MD  carvedilol (COREG) 6.25 MG tablet Take 6.25 mg by mouth 2 (two) times daily with a meal.    Historical Provider, MD  fluconazole (DIFLUCAN) 100 MG tablet Take 1 tablet (100 mg total) by mouth daily. 04/01/15   Theodis Blaze, MD  insulin aspart (NOVOLOG) 100 UNIT/ML injection Before each meal 3 times a day, 120-150 - 2 units, 151-200 - 4 units, 201-250 - 8 units, 251-300 - 10 units,  301 or above 10 units and call your MD Patient not taking: Reported on 03/28/2015 12/10/11   Thurnell Lose, MD  insulin glargine (LANTUS) 100 UNIT/ML injection Inject 0.32 mLs (32 Units total) into the skin at bedtime. 04/01/15   Theodis Blaze, MD  levothyroxine (SYNTHROID, LEVOTHROID) 200 MCG tablet Take 200 mcg by mouth daily before breakfast.    Historical Provider, MD  mercaptopurine (PURINETHOL) 50 MG tablet Take 50 mg by mouth daily. Give on an empty stomach 1 hour before or 2 hours after meals. Caution: Chemotherapy.    Historical Provider, MD  nystatin (MYCOSTATIN) 100000 UNIT/ML suspension Take 5 mLs (  500,000 Units total) by mouth 4 (four) times daily. 04/03/15   Theodis Blaze, MD  ondansetron (ZOFRAN-ODT) 4 MG disintegrating tablet Take 4 mg by mouth every 8 (eight) hours as needed for nausea or vomiting.    Historical Provider, MD  predniSONE (DELTASONE) 20 MG tablet Take 60 mg by mouth daily with breakfast.    Historical Provider, MD   BP 109/76 mmHg  Pulse 104  Temp(Src) 97.7 F (36.5 C) (Oral)  Resp 24  SpO2 100% Physical Exam  Constitutional: She is oriented to person, place, and time. She appears well-developed and well-nourished.   HENT:  Head: Normocephalic and atraumatic.  Eyes: Conjunctivae and EOM are normal. Pupils are equal, round, and reactive to light.  Neck: Normal range of motion.  Cardiovascular: Regular rhythm.  Exam reveals no gallop and no friction rub.   No murmur heard. tachycardic  Pulmonary/Chest: Effort normal and breath sounds normal. She has no wheezes. She has no rales. She exhibits no tenderness.  Abdominal: Soft. She exhibits no distension. There is no tenderness. There is no rebound.  Musculoskeletal: Normal range of motion.  Neurological: She is alert and oriented to person, place, and time.  Somnolent. Patient able to answer questions and follow commands.   Skin: Skin is warm and dry.  Psychiatric: She has a normal mood and affect. Her behavior is normal.  Nursing note and vitals reviewed.   ED Course  Procedures (including critical care time)  CRITICAL CARE Performed by: Alvina Chou   Total critical care time: 1 hour  Critical care time was exclusive of separately billable procedures and treating other patients.  Critical care was necessary to treat or prevent imminent or life-threatening deterioration.  Critical care was time spent personally by me on the following activities: development of treatment plan with patient and/or surrogate as well as nursing, discussions with consultants, evaluation of patient's response to treatment, examination of patient, obtaining history from patient or surrogate, ordering and performing treatments and interventions, ordering and review of laboratory studies, ordering and review of radiographic studies, pulse oximetry and re-evaluation of patient's condition.   Labs Review Labs Reviewed  CBC WITH DIFFERENTIAL/PLATELET - Abnormal; Notable for the following:    RBC 5.42 (*)    Hemoglobin 15.1 (*)    HCT 46.3 (*)    RDW 20.8 (*)    All other components within normal limits  COMPREHENSIVE METABOLIC PANEL - Abnormal; Notable for the  following:    Sodium 153 (*)    Chloride 113 (*)    Glucose, Bld 685 (*)    BUN 24 (*)    Creatinine, Ser 2.78 (*)    Albumin 3.0 (*)    AST 829 (*)    ALT 1250 (*)    Alkaline Phosphatase 236 (*)    Total Bilirubin 2.4 (*)    GFR calc non Af Amer 18 (*)    GFR calc Af Amer 20 (*)    Anion gap 16 (*)    All other components within normal limits  CBG MONITORING, ED - Abnormal; Notable for the following:    Glucose-Capillary 586 (*)    All other components within normal limits  I-STAT CG4 LACTIC ACID, ED - Abnormal; Notable for the following:    Lactic Acid, Venous 4.97 (*)    All other components within normal limits  CBG MONITORING, ED - Abnormal; Notable for the following:    Glucose-Capillary 424 (*)    All other components within normal limits  CBG  MONITORING, ED - Abnormal; Notable for the following:    Glucose-Capillary 280 (*)    All other components within normal limits  LIPASE, BLOOD  URINALYSIS, ROUTINE W REFLEX MICROSCOPIC (NOT AT [facility])  AMMONIA  BETA-HYDROXYBUTYRIC ACID  I-STAT TROPOININ, ED    Imaging Review Dg Chest Port 1 View  04/09/2015   CLINICAL DATA:  Weakness and dizziness.  Low blood pressure.  EXAM: PORTABLE CHEST - 1 VIEW  COMPARISON:  None.  FINDINGS: Stable enlarged cardiac silhouette. There are low lung volumes and basilar atelectasis. No effusion, infiltrate, pneumothorax.  IMPRESSION: Cardiomegaly and low lung volumes.  No acute findings   Electronically Signed   By: Suzy Bouchard M.D.   On: 04/09/2015 17:16     EKG Interpretation   Date/Time:  Saturday April 09 2015 17:05:48 EDT Ventricular Rate:  104 PR Interval:  124 QRS Duration: 75 QT Interval:  362 QTC Calculation: 476 R Axis:   53 Text Interpretation:  Sinus tachycardia Ventricular premature complex Low  voltage, precordial leads Nonspecific T abnormalities, diffuse leads Since  last tracing rate slower Nonspecific T wave abnormality now present  Abnormal ekg Confirmed by Sabra Heck   MD, BRIAN ([id number]) on 04/09/2015 5:11:41  PM      MDM   Final diagnoses:  Hypoxia  Diabetic ketoacidosis without coma associated with type 2 diabetes mellitus  Hepatitis    5:07 PM I met the patient when she arrived to the room where she was somnolent and hypotensive. Patient hyperglycemia at 589. Patient had 2 IV's started and was bolused with 3 liters of fluids. She is afebrile at this time. Patient noted to by hypoxic at 83% on room air at this time. Patient was placed in 2L nasal cannula. Patient has now improved with blood pressure 111/77. Patient will be started on glucostabilizer. Labs, urine and chest xray pending.     Alvina Chou, PA-C 04/09/15 1914  Noemi Chapel, MD 04/10/15 [phone]

## 2015-04-10 ENCOUNTER — Inpatient Hospital Stay (HOSPITAL_COMMUNITY): Payer: 59

## 2015-04-10 DIAGNOSIS — E870 Hyperosmolality and hypernatremia: Secondary | ICD-10-CM

## 2015-04-10 DIAGNOSIS — R740 Nonspecific elevation of levels of transaminase and lactic acid dehydrogenase [LDH]: Secondary | ICD-10-CM

## 2015-04-10 LAB — HEPATIC FUNCTION PANEL
ALK PHOS: 164 U/L — AB (ref 38–126)
ALT: 981 U/L — ABNORMAL HIGH (ref 14–54)
AST: 700 U/L — ABNORMAL HIGH (ref 15–41)
Albumin: 2.5 g/dL — ABNORMAL LOW (ref 3.5–5.0)
BILIRUBIN DIRECT: 1 mg/dL — AB (ref 0.1–0.5)
BILIRUBIN INDIRECT: 0.8 mg/dL (ref 0.3–0.9)
BILIRUBIN TOTAL: 1.8 mg/dL — AB (ref 0.3–1.2)
TOTAL PROTEIN: 6.7 g/dL (ref 6.5–8.1)

## 2015-04-10 LAB — CBC
HCT: 41.5 % (ref 36.0–46.0)
HEMOGLOBIN: 13.4 g/dL (ref 12.0–15.0)
MCH: 27.9 pg (ref 26.0–34.0)
MCHC: 32.3 g/dL (ref 30.0–36.0)
MCV: 86.5 fL (ref 78.0–100.0)
PLATELETS: 162 10*3/uL (ref 150–400)
RBC: 4.8 MIL/uL (ref 3.87–5.11)
RDW: 21.3 % — ABNORMAL HIGH (ref 11.5–15.5)
WBC: 9 10*3/uL (ref 4.0–10.5)

## 2015-04-10 LAB — BASIC METABOLIC PANEL
ANION GAP: 3 — AB (ref 5–15)
Anion gap: 5 (ref 5–15)
BUN: 20 mg/dL (ref 6–20)
BUN: 20 mg/dL (ref 6–20)
BUN: 20 mg/dL (ref 6–20)
BUN: 15 mg/dL (ref 6–20)
CALCIUM: 8.1 mg/dL — AB (ref 8.9–10.3)
CALCIUM: 8.4 mg/dL — AB (ref 8.9–10.3)
CO2: 26 mmol/L (ref 22–32)
CO2: 25 mmol/L (ref 22–32)
CO2: 22 mmol/L (ref 22–32)
CO2: 25 mmol/L (ref 22–32)
CREATININE: 1.77 mg/dL — AB (ref 0.44–1.00)
Calcium: 8.2 mg/dL — ABNORMAL LOW (ref 8.9–10.3)
Calcium: 8.1 mg/dL — ABNORMAL LOW (ref 8.9–10.3)
Chloride: 130 mmol/L — ABNORMAL HIGH (ref 101–111)
Chloride: 121 mmol/L — ABNORMAL HIGH (ref 101–111)
Creatinine, Ser: 1.58 mg/dL — ABNORMAL HIGH (ref 0.44–1.00)
Creatinine, Ser: 1.51 mg/dL — ABNORMAL HIGH (ref 0.44–1.00)
Creatinine, Ser: 1.4 mg/dL — ABNORMAL HIGH (ref 0.44–1.00)
GFR calc Af Amer: 35 mL/min — ABNORMAL LOW (ref >60)
GFR calc Af Amer: 43 mL/min — ABNORMAL LOW (ref >60)
GFR calc Af Amer: 47 mL/min — ABNORMAL LOW (ref >60)
GFR, EST AFRICAN AMERICAN: 41 mL/min — AB (ref >60)
GFR, EST NON AFRICAN AMERICAN: 31 mL/min — AB (ref >60)
GFR, EST NON AFRICAN AMERICAN: 35 mL/min — AB (ref >60)
GFR, EST NON AFRICAN AMERICAN: 37 mL/min — AB (ref >60)
GFR, EST NON AFRICAN AMERICAN: 41 mL/min — AB (ref >60)
Glucose, Bld: 251 mg/dL — ABNORMAL HIGH (ref 65–99)
Glucose, Bld: 195 mg/dL — ABNORMAL HIGH (ref 65–99)
Glucose, Bld: 141 mg/dL — ABNORMAL HIGH (ref 65–99)
Glucose, Bld: 382 mg/dL — ABNORMAL HIGH (ref 65–99)
POTASSIUM: 4.8 mmol/L (ref 3.5–5.1)
Potassium: 4 mmol/L (ref 3.5–5.1)
Potassium: 3.9 mmol/L (ref 3.5–5.1)
Potassium: 4.6 mmol/L (ref 3.5–5.1)
Sodium: 159 mmol/L — ABNORMAL HIGH (ref 135–145)
Sodium: 159 mmol/L — ABNORMAL HIGH (ref 135–145)
Sodium: 158 mmol/L — ABNORMAL HIGH (ref 135–145)
Sodium: 151 mmol/L — ABNORMAL HIGH (ref 135–145)

## 2015-04-10 LAB — GLUCOSE, CAPILLARY
GLUCOSE-CAPILLARY: 146 mg/dL — AB (ref 65–99)
GLUCOSE-CAPILLARY: 111 mg/dL — AB (ref 65–99)
GLUCOSE-CAPILLARY: 250 mg/dL — AB (ref 65–99)
GLUCOSE-CAPILLARY: 315 mg/dL — AB (ref 65–99)
Glucose-Capillary: 167 mg/dL — ABNORMAL HIGH (ref 65–99)
Glucose-Capillary: 182 mg/dL — ABNORMAL HIGH (ref 65–99)
Glucose-Capillary: 194 mg/dL — ABNORMAL HIGH (ref 65–99)
Glucose-Capillary: 267 mg/dL — ABNORMAL HIGH (ref 65–99)
Glucose-Capillary: 149 mg/dL — ABNORMAL HIGH (ref 65–99)
Glucose-Capillary: 255 mg/dL — ABNORMAL HIGH (ref 65–99)
Glucose-Capillary: 303 mg/dL — ABNORMAL HIGH (ref 65–99)

## 2015-04-10 LAB — T4, FREE: Free T4: 1.62 ng/dL — ABNORMAL HIGH (ref 0.61–1.12)

## 2015-04-10 LAB — LACTIC ACID, PLASMA
LACTIC ACID, VENOUS: 3.3 mmol/L — AB (ref 0.5–2.0)
Lactic Acid, Venous: 2.4 mmol/L (ref 0.5–2.0)

## 2015-04-10 LAB — TROPONIN I
Troponin I: <0.03 ng/mL (ref <0.031)
Troponin I: <0.03 ng/mL (ref <0.031)
Troponin I: <0.03 ng/mL (ref <0.031)

## 2015-04-10 LAB — PROTIME-INR
INR: 1.3 (ref 0.00–1.49)
INR: 1.25 (ref 0.00–1.49)
PROTHROMBIN TIME: 16.3 s — AB (ref 11.6–15.2)
Prothrombin Time: 15.8 seconds — ABNORMAL HIGH (ref 11.6–15.2)

## 2015-04-10 LAB — APTT
APTT: 26 s (ref 24–37)
APTT: 27 s (ref 24–37)

## 2015-04-10 LAB — TSH: TSH: 0.06 u[IU]/mL — ABNORMAL LOW (ref 0.350–4.500)

## 2015-04-10 LAB — MRSA PCR SCREENING: MRSA by PCR: NEGATIVE

## 2015-04-10 MED ORDER — INSULIN GLARGINE 100 UNIT/ML ~~LOC~~ SOLN
20.0000 [IU] | Freq: Two times a day (BID) | SUBCUTANEOUS | Status: DC
Start: 2015-04-10 — End: 2015-04-10
  Administered 2015-04-10: 20 [IU] via SUBCUTANEOUS
  Filled 2015-04-10 (×2): qty 0.2

## 2015-04-10 MED ORDER — INSULIN ASPART 100 UNIT/ML ~~LOC~~ SOLN
0.0000 [IU] | Freq: Three times a day (TID) | SUBCUTANEOUS | Status: DC
  Administered 2015-04-10: 15 [IU] via SUBCUTANEOUS
  Administered 2015-04-10: 7 [IU] via SUBCUTANEOUS
  Administered 2015-04-11: 20 [IU] via SUBCUTANEOUS
  Administered 2015-04-11: 15 [IU] via SUBCUTANEOUS
  Administered 2015-04-11: 20 [IU] via SUBCUTANEOUS
  Administered 2015-04-12: 7 [IU] via SUBCUTANEOUS
  Administered 2015-04-12: 15 [IU] via SUBCUTANEOUS
  Administered 2015-04-12: 3 [IU] via SUBCUTANEOUS

## 2015-04-10 MED ORDER — INSULIN ASPART 100 UNIT/ML ~~LOC~~ SOLN
11.0000 [IU] | Freq: Once | SUBCUTANEOUS | Status: AC
  Administered 2015-04-10: 11 [IU] via SUBCUTANEOUS

## 2015-04-10 MED ORDER — INSULIN GLARGINE 100 UNIT/ML ~~LOC~~ SOLN
30.0000 [IU] | Freq: Two times a day (BID) | SUBCUTANEOUS | Status: DC
  Administered 2015-04-10 – 2015-04-12 (×5): 30 [IU] via SUBCUTANEOUS
  Filled 2015-04-10 (×7): qty 0.3

## 2015-04-10 MED ORDER — DEXTROSE 5 % IV SOLN
INTRAVENOUS | Status: DC
  Administered 2015-04-10 – 2015-04-11 (×3): via INTRAVENOUS
  Filled 2015-04-10: qty 1000

## 2015-04-10 MED ORDER — POTASSIUM CHLORIDE 10 MEQ/100ML IV SOLN
10.0000 meq | INTRAVENOUS | Status: AC
  Administered 2015-04-10 (×2): 10 meq via INTRAVENOUS
  Filled 2015-04-10 (×2): qty 100

## 2015-04-10 MED ORDER — INSULIN ASPART 100 UNIT/ML ~~LOC~~ SOLN
0.0000 [IU] | Freq: Every day | SUBCUTANEOUS | Status: DC
  Administered 2015-04-10 – 2015-04-12 (×3): 4 [IU] via SUBCUTANEOUS
  Filled 2015-04-10 (×2): qty 1

## 2015-04-10 MED ORDER — INSULIN GLARGINE 100 UNIT/ML ~~LOC~~ SOLN: 20.0000 [IU] | Freq: Two times a day (BID) | SUBCUTANEOUS | Status: DC

## 2015-04-10 MED ORDER — LEVOTHYROXINE SODIUM 150 MCG PO TABS
150.0000 ug | ORAL_TABLET | Freq: Every day | ORAL | Status: DC
  Administered 2015-04-11 – 2015-04-13 (×3): 150 ug via ORAL
  Filled 2015-04-10 (×4): qty 1

## 2015-04-10 NOTE — Progress Notes (Signed)
CRITICAL VALUE ALERT  Critical value received:  Chloride >130  Date of notification:  04/10/15  Time of notification:  0815  Critical value read back:Yes.    Nurse who received alert:  Kensly Bowmer   MD notified (1st page):  Rito Ehrlich  Time of first page:  [phone]  MD notified (2nd page):  Time of second page:  Responding MD:  Rito Ehrlich  Time MD responded:

## 2015-04-10 NOTE — Progress Notes (Signed)
CRITICAL VALUE ALERT  Critical value received: lactic acid 2.4  Date of notification:  04/10/15  Time of notification: 0045  Critical value read back: yes  Nurse who received alert: M.Foster Simpson, RN   MD notified (1st page):  L. Harduk   Time of first page:  0100  MD notified (2nd page):  Time of second page:  Responding MD: Wyn Forster  Time MD responded:  0108  No new orders. Lactic acid trending down. Will continue to monitor vital and neuro status closely.   M.Foster Simpson, RN

## 2015-04-10 NOTE — Progress Notes (Addendum)
[facility] PROGRESS NOTE  Dana Thornton ZOX:[id number] DOB: 08-03-1956 DOA: 04/09/2015  PCP: Astrid Divine, MD  Brief HPI: 59 year old African-American female with a past medical history of diabetes on insulin, autoimmune hepatitis who was hospitalized recently, about a week ago for uncontrolled diabetes. She was diagnosed with oral thrush. He was discharged on nystatin and Diflucan. She is on steroids for autoimmune hepatitis, which has been continued. She presented to the hospital with complaints of high blood sugar. She was noted to be hypotensive in the ED. There was suspicion of mild DKA. She was admitted for further management.  Past medical history:  Past Medical History  Diagnosis Date  . Hypertension   . Kidney stones   . Hypercholesterolemia   . Type II diabetes mellitus     Consultants: Gastroenterology, Dr. Madilyn Fireman  Procedures: None  Antibiotics: None  Subjective: Patient feels better this morning. Not confused as yesterday. Denies any pain. Asking for something to eat and drink. Continues to have some difficulty swallowing.  Objective: Vital Signs  Filed Vitals:   04/10/15 0405 04/10/15 0600 04/10/15 0740 04/10/15 0800  BP: 115/72 131/81 96/45 104/76  Pulse: 84 90 93 85  Temp: 97.5 F (36.4 C)  97.7 F (36.5 C)   TempSrc: Oral  Oral   Resp: 22 19 18 21   Height:      Weight:      SpO2: 97% 96% 97% 99%    Intake/Output Summary (Last 24 hours) at 04/10/15 1210 Last data filed at 04/10/15 1038  Gross per 24 hour  Intake 4758.09 ml  Output    100 ml  Net 4658.09 ml   Filed Weights   04/09/15 2030  Weight: 106.9 kg (235 lb 10.8 oz)    General appearance: alert, cooperative, appears stated age and no distress Throat: lips, mucosa, and tongue normal; teeth and gums normal Resp: clear to auscultation bilaterally Cardio: regular rate and rhythm, S1, S2 normal, no murmur, click, rub or gallop GI: soft, non-tender; bowel sounds normal;  no masses,  no organomegaly Extremities: extremities normal, atraumatic, no cyanosis or edema Neurologic: Alert and oriented X 3, no focal deficits  Lab Results:  Basic Metabolic Panel:  Recent Labs Lab 04/09/15 1654 04/09/15 2308 04/10/15 0310 04/10/15 0600  NA 153* 159* 159* 158*  K 4.8 4.0 3.9 4.8  CL 113* 130* >130* >130*  CO2 24 26 25 22   GLUCOSE 685* 251* 195* 141*  BUN 24* 20 20 20   CREATININE 2.78* 1.77* 1.58* 1.51*  CALCIUM 9.4 8.2* 8.1* 8.1*   Liver Function Tests:  Recent Labs Lab 04/09/15 1654 04/10/15 0600  AST 829* 700*  ALT 1250* 981*  ALKPHOS 236* 164*  BILITOT 2.4* 1.8*  PROT 7.9 6.7  ALBUMIN 3.0* 2.5*    Recent Labs Lab 04/09/15 1654  LIPASE 35    Recent Labs Lab 04/09/15 1950  AMMONIA 44*   CBC:  Recent Labs Lab 04/09/15 1654 04/10/15 0600  WBC 7.9 9.0  NEUTROABS 4.8  --   HGB 15.1* 13.4  HCT 46.3* 41.5  MCV 85.4 86.5  PLT 194 162   Cardiac Enzymes:  Recent Labs Lab 04/09/15 2308 04/10/15 0310 04/10/15 0941  TROPONINI <0.03 <0.03 <0.03   CBG:  Recent Labs Lab 04/10/15 0226 04/10/15 0401 04/10/15 0503 04/10/15 0607 04/10/15 1203  GLUCAP 194* 146* 111* 149* 250*    Recent Results (from the past 240 hour(s))  MRSA PCR Screening     Status: None   Collection Time: 04/09/15  8:27 PM  Result Value Ref Range Status   MRSA by PCR NEGATIVE NEGATIVE Final    Comment:        The GeneXpert MRSA Assay (FDA approved for NASAL specimens only), is one component of a comprehensive MRSA colonization surveillance program. It is not intended to diagnose MRSA infection nor to guide or monitor treatment for MRSA infections.       Studies/Results: US Abdomen Complete  04/10/2015   CLINICAL DATA:  Transaminitis  EXAM: ULTRASOUND ABDOMEN COMPLETE  COMPARISON:  CT 12/03/2011  FINDINGS: Gallbladder: Small amount of sludge within the gallbladder. No wall thickening. No stones. Negative sonographic Murphy's.  Common bile  duct: Diameter: Normal caliber, 4 mm  Liver: No focal lesion identified. Within normal limits in parenchymal echogenicity.  IVC: No abnormality visualized.  Pancreas: Visualized portion unremarkable.  Spleen: Size and appearance within normal limits.  Right Kidney: Length: 10.3 cm. Multiple cysts, the largest 3.4 cm. Echogenicity within normal limits. No mass or hydronephrosis visualized.  Left Kidney: Length: 10.8 cm. Multiple cysts, the largest 1.3 cm. Echogenicity within normal limits. No mass or hydronephrosis visualized.  Abdominal aorta: No aneurysm visualized.  Other findings: Incidentally noted is a small right pleural effusion.  IMPRESSION: Small amount of sludge in the gallbladder. No stones or evidence of acute cholecystitis.  Bilateral renal cysts.  Right pleural effusion.   Electronically Signed   By: Charlett Nose M.D.   On: 04/10/2015 09:36   Dg Chest Port 1 View  04/09/2015   CLINICAL DATA:  Weakness and dizziness.  Low blood pressure.  EXAM: PORTABLE CHEST - 1 VIEW  COMPARISON:  None.  FINDINGS: Stable enlarged cardiac silhouette. There are low lung volumes and basilar atelectasis. No effusion, infiltrate, pneumothorax.  IMPRESSION: Cardiomegaly and low lung volumes.  No acute findings   Electronically Signed   By: Genevive Bi M.D.   On: 04/09/2015 17:16    Medications:  Scheduled: . antiseptic oral rinse  7 mL Mouth Rinse BID  . fluconazole (DIFLUCAN) IV  200 mg Intravenous Q24H  . heparin  5,000 Units Subcutaneous 3 times per day  . insulin aspart  0-20 Units Subcutaneous TID WC  . insulin aspart  0-5 Units Subcutaneous QHS  . insulin aspart  11 Units Subcutaneous Once  . insulin glargine  20 Units Subcutaneous BID  . levothyroxine  200 mcg Oral QAC breakfast  . nystatin  5 mL Oral QID  . predniSONE  40 mg Oral Q breakfast  . sodium chloride  3 mL Intravenous Q12H   Continuous: . dextrose 100 mL/hr at 04/10/15 1100   [id number] **OR** ondansetron (ZOFRAN)  IV  Assessment/Plan:  Principal Problem:   Hyperglycemia Active Problems:   Autoimmune hepatitis   Hypernatremia   DKA (diabetic ketoacidoses)   Hypotension   ARF (acute renal failure)   Transaminitis   Shock liver   Lactic acidosis    Hyperglycemia, possible mild DKA Beta hydroxybutyrate was not significantly elevated. Patient's hyperglycemia has improved with IV insulin. Anion gap which was only mildly elevated and is now normal. Transition her to Lantus. Place her on resistance sliding scale. HbA1c is pending.  Transaminitis in the setting of known history of autoimmune hepatitis LFTs are noted to be significantly higher yesterday compared to last week when she was in the hospital. This was in the setting of hypotension. Thought to be secondary to shock liver. Discussed with gastroenterology yesterday. Steroids are being continued at a slightly lower dose. LFTs are showing some improvement  this morning. Her mercaptopurine has been held for now. Ultrasound the abdomen is pending. Hepatitis panel is pending. INR is normal.  Hypotension Blood pressure has improved. This was most likely due to dehydration and the fact that she was taking 3 different antihypertensives agents. Hold all her agents for now. Continue to monitor her closely. Also hold off on stress dose steroids for now.  Lactic acidosis Most likely due to hypovolemia. Levels are improving. No evidence for infection at this time. UA does not suggest infection. Chest x-ray did not show any infiltrates. She is afebrile with a normal white count. Continue to monitor  Recent oral thrush She reports odynophagia. Currently, there are no white plaques noted on her oral examination. Continue Diflucan. GI to consider endoscopy if patient's symptoms do not improve.   Acute encephalopathy Most likely due to acute illness dehydrated state and hyperglycemia. She did not have any focal deficits. Ammonia level was mildly elevated.  Patient's mental status is now back to baseline.   Abnormal EKG Troponins normal. Repeat EKG does not show any change compared to yesterday. Patient denies any chest pain. Anticipate no further workup at this time.  Hypernatremia Corrected sodium was 162 yesterday. Sodium level is still elevated this morning. Change her IV fluids. Repeat sodium levels later today and tomorrow. Possibly due to dehydrated state.  Acute renal failure Most likely prerenal from hypovolemia and dehydration. Renal function has improved with aggressive hydration. Monitor urine output.   Morbid obesity BMI is around 44   DVT Prophylaxis: subcutaneous heparin    Code Status:Full code Family Communication: Discussed with patient and her husband  Disposition Plan: Leave in stepdown for today.     LOS: 1 day   [facility]  [facility] Pager [phone] 04/10/2015, 12:10 PM  If 7PM-7AM, please contact night-coverage at [email or web address], password [facility]

## 2015-04-10 NOTE — Consult Note (Signed)
[facility] Consult Note  Referring Provider: No ref. provider found Primary Care Physician:  Osborne Casco, MD Primary Gastroenterologist:  Dr.  Laurel Dimmer Complaint: Dysphagia, dizziness, hypotension HPI: Dana Thornton is an 59 y.o. black female  with diabetes mellitus and autoimmune hepatitis who was last hospitalized a week ago with poorly controlled diabetes. She had recently had a flare of her autoimmune hepatitis which did not respond to initiation of prednisone 20 mg in addition to her azathioprine. We therefore increased her prednisone to 60 mg a day and her LFTs improved. We then decreased her dose to 40 mg a day. They remained improved while she was in the hospital last week. In the last 3 or 4 days she developed odynophagia and had been diagnosed with thrush during her last admission and put on Diflucan and oral nystatin. Her by mouth intake decreased significantly and she became weak and dizzy. She was found to be hypotensive with an acute bump in her LFTs and blood glucose 500 on admission. I discussed her case with the hospitalist and we both thought they increase in LFTs was likely partially due to shock liver and felt that we should maintain her on prednisone. She was hydrated significantly and feels better today. Today's labs are pending  Past Medical History  Diagnosis Date  . Hypertension   . Kidney stones   . Hypercholesterolemia   . Type II diabetes mellitus     Past Surgical History  Procedure Laterality Date  . Dilation and curettage of uterus      "I lost a baby"  . Cesarean section  1999  . Tubal ligation  1999    Medications Prior to Admission  Medication Sig Dispense Refill  . aspirin 325 MG tablet Take 325 mg by mouth daily.    . carvedilol (COREG) 6.25 MG tablet Take 6.25 mg by mouth 2 (two) times daily with a meal.    . insulin aspart (NOVOLOG) 100 UNIT/ML injection Before each meal 3 times a day, 120-150 - 2 units, 151-200 - 4 units,  201-250 - 8 units, 251-300 - 10 units,  301 or above 10 units and call your MD 1 vial 1  . insulin glargine (LANTUS) 100 UNIT/ML injection Inject 0.32 mLs (32 Units total) into the skin at bedtime. 10 mL 5  . levothyroxine (SYNTHROID, LEVOTHROID) 200 MCG tablet Take 200 mcg by mouth daily before breakfast.    . mercaptopurine (PURINETHOL) 50 MG tablet Take 50 mg by mouth daily. Give on an empty stomach 1 hour before or 2 hours after meals. Caution: Chemotherapy.    . nystatin (MYCOSTATIN) 100000 UNIT/ML suspension Take 5 mLs (500,000 Units total) by mouth 4 (four) times daily. 500 mL 1  . predniSONE (DELTASONE) 20 MG tablet Take 60 mg by mouth daily with breakfast.    . fluconazole (DIFLUCAN) 100 MG tablet Take 1 tablet (100 mg total) by mouth daily. 10 tablet 1  . ondansetron (ZOFRAN-ODT) 4 MG disintegrating tablet Take 4 mg by mouth every 8 (eight) hours as needed for nausea or vomiting.      Allergies:  Allergies  Allergen Reactions  . Codeine Nausea And Vomiting    Family History  Problem Relation Age of Onset  . Kidney failure Father   . Hypertension Father     Social History:  reports that she has never smoked. She has never used smokeless tobacco. She reports that she does not drink alcohol or use illicit drugs.  Review of Systems: negative except  as above   Blood pressure 96/45, pulse 93, temperature 97.7 F (36.5 C), temperature source Oral, resp. rate 18, height '5\' 2"'  (1.575 m), weight 106.9 kg (235 lb 10.8 oz), SpO2 97 %. Head: Normocephalic, without obvious abnormality, atraumatic Neck: no adenopathy, no carotid bruit, no JVD, supple, symmetrical, trachea midline and thyroid not enlarged, symmetric, no tenderness/mass/nodules Resp: clear to auscultation bilaterally Cardio: regular rate and rhythm, S1, S2 normal, no murmur, click, rub or gallop GI: Abdomen soft nondistended with normoactive bowel sounds. No hepatomegaly masses or guarding Extremities: extremities normal,  atraumatic, no cyanosis or edema  Results for orders placed or performed during the hospital encounter of 04/09/15 (from the past 48 hour(s))  CBG monitoring, ED     Status: Abnormal   Collection Time: 04/09/15  4:26 PM  Result Value Ref Range   Glucose-Capillary 586 (HH) 65 - 99 mg/dL  CBC with Differential/Platelet     Status: Abnormal   Collection Time: 04/09/15  4:54 PM  Result Value Ref Range   WBC 7.9 4.0 - 10.5 K/uL   RBC 5.42 (H) 3.87 - 5.11 MIL/uL   Hemoglobin 15.1 (H) 12.0 - 15.0 g/dL   HCT 46.3 (H) 36.0 - 46.0 %   MCV 85.4 78.0 - 100.0 fL   MCH 27.9 26.0 - 34.0 pg   MCHC 32.6 30.0 - 36.0 g/dL   RDW 20.8 (H) 11.5 - 15.5 %   Platelets 194 150 - 400 K/uL   Neutrophils Relative % 61 43 - 77 %   Neutro Abs 4.8 1.7 - 7.7 K/uL   Lymphocytes Relative 29 12 - 46 %   Lymphs Abs 2.3 0.7 - 4.0 K/uL   Monocytes Relative 9 3 - 12 %   Monocytes Absolute 0.7 0.1 - 1.0 K/uL   Eosinophils Relative 1 0 - 5 %   Eosinophils Absolute 0.0 0.0 - 0.7 K/uL   Basophils Relative 1 0 - 1 %   Basophils Absolute 0.1 0.0 - 0.1 K/uL  Comprehensive metabolic panel     Status: Abnormal   Collection Time: 04/09/15  4:54 PM  Result Value Ref Range   Sodium 153 (H) 135 - 145 mmol/L   Potassium 4.8 3.5 - 5.1 mmol/L   Chloride 113 (H) 101 - 111 mmol/L   CO2 24 22 - 32 mmol/L   Glucose, Bld 685 (HH) 65 - 99 mg/dL    Comment: REPEATED TO VERIFY CRITICAL RESULT CALLED TO, READ BACK BY AND VERIFIED WITH: COMPTONMRN [id number] MCCAULEG    BUN 24 (H) 6 - 20 mg/dL   Creatinine, Ser 2.78 (H) 0.44 - 1.00 mg/dL   Calcium 9.4 8.9 - 10.3 mg/dL   Total Protein 7.9 6.5 - 8.1 g/dL   Albumin 3.0 (L) 3.5 - 5.0 g/dL   AST 829 (H) 15 - 41 U/L   ALT 1250 (H) 14 - 54 U/L   Alkaline Phosphatase 236 (H) 38 - 126 U/L   Total Bilirubin 2.4 (H) 0.3 - 1.2 mg/dL   GFR calc non Af Amer 18 (L) >60 mL/min   GFR calc Af Amer 20 (L) >60 mL/min    Comment: (NOTE) The eGFR has been calculated using the CKD EPI equation. This  calculation has not been validated in all clinical situations. eGFR's persistently <60 mL/min signify possible Chronic Kidney Disease.    Anion gap 16 (H) 5 - 15  Lipase, blood     Status: None   Collection Time: 04/09/15  4:54 PM  Result Value Ref  Range   Lipase 35 22 - 51 U/L  I-Stat CG4 Lactic Acid, ED     Status: Abnormal   Collection Time: 04/09/15  5:01 PM  Result Value Ref Range   Lactic Acid, Venous 4.97 (HH) 0.5 - 2.0 mmol/L   Comment NOTIFIED PHYSICIAN   I-stat troponin, ED     Status: None   Collection Time: 04/09/15  5:19 PM  Result Value Ref Range   Troponin i, poc 0.02 0.00 - 0.08 ng/mL   Comment 3            Comment: Due to the release kinetics of cTnI, a negative result within the first hours of the onset of symptoms does not rule out myocardial infarction with certainty. If myocardial infarction is still suspected, repeat the test at appropriate intervals.   CBG monitoring, ED     Status: Abnormal   Collection Time: 04/09/15  5:37 PM  Result Value Ref Range   Glucose-Capillary 424 (H) 65 - 99 mg/dL  CBG monitoring, ED     Status: Abnormal   Collection Time: 04/09/15  7:02 PM  Result Value Ref Range   Glucose-Capillary 280 (H) 65 - 99 mg/dL   Comment 1 Notify RN   Ammonia     Status: Abnormal   Collection Time: 04/09/15  7:50 PM  Result Value Ref Range   Ammonia 44 (H) 9 - 35 umol/L  Beta-hydroxybutyric acid     Status: Abnormal   Collection Time: 04/09/15  7:50 PM  Result Value Ref Range   Beta-Hydroxybutyric Acid 0.45 (H) 0.05 - 0.27 mmol/L  CG4 I-STAT (Lactic acid)     Status: Abnormal   Collection Time: 04/09/15  8:13 PM  Result Value Ref Range   Lactic Acid, Venous 3.34 (HH) 0.5 - 2.0 mmol/L   Comment NOTIFIED PHYSICIAN   MRSA PCR Screening     Status: None   Collection Time: 04/09/15  8:27 PM  Result Value Ref Range   MRSA by PCR NEGATIVE NEGATIVE    Comment:        The GeneXpert MRSA Assay (FDA approved for NASAL specimens only), is one  component of a comprehensive MRSA colonization surveillance program. It is not intended to diagnose MRSA infection nor to guide or monitor treatment for MRSA infections.   Glucose, capillary     Status: Abnormal   Collection Time: 04/09/15  8:38 PM  Result Value Ref Range   Glucose-Capillary 300 (H) 65 - 99 mg/dL  Urinalysis, Routine w reflex microscopic (not at [facility])     Status: Abnormal   Collection Time: 04/09/15  8:40 PM  Result Value Ref Range   Color, Urine AMBER (A) YELLOW    Comment: BIOCHEMICALS MAY BE AFFECTED BY COLOR   APPearance CLOUDY (A) CLEAR   Specific Gravity, Urine 1.021 1.005 - 1.030   pH 5.0 5.0 - 8.0   Glucose, UA >1000 (A) NEGATIVE mg/dL   Hgb urine dipstick NEGATIVE NEGATIVE   Bilirubin Urine SMALL (A) NEGATIVE   Ketones, ur NEGATIVE NEGATIVE mg/dL   Protein, ur 30 (A) NEGATIVE mg/dL   Urobilinogen, UA 1.0 0.0 - 1.0 mg/dL   Nitrite NEGATIVE NEGATIVE   Leukocytes, UA NEGATIVE NEGATIVE  Urine microscopic-add on     Status: Abnormal   Collection Time: 04/09/15  8:40 PM  Result Value Ref Range   Squamous Epithelial / LPF FEW (A) RARE   WBC, UA 3-6 <3 WBC/hpf   RBC / HPF 0-2 <3 RBC/hpf   Bacteria, UA  MANY (A) RARE   Urine-Other MUCOUS PRESENT   Glucose, capillary     Status: Abnormal   Collection Time: 04/09/15 10:01 PM  Result Value Ref Range   Glucose-Capillary 267 (H) 65 - 99 mg/dL  Glucose, capillary     Status: Abnormal   Collection Time: 04/09/15 11:06 PM  Result Value Ref Range   Glucose-Capillary 182 (H) 65 - 99 mg/dL  Lactic acid, plasma     Status: Abnormal   Collection Time: 04/09/15 11:08 PM  Result Value Ref Range   Lactic Acid, Venous 2.4 (HH) 0.5 - 2.0 mmol/L    Comment: CRITICAL RESULT CALLED TO, READ BACK BY AND VERIFIED WITH: PIEL,M RN 04/10/2015 0035 JORDANS REPEATED TO VERIFY   Basic metabolic panel (stat then every 4 hours)     Status: Abnormal   Collection Time: 04/09/15 11:08 PM  Result Value Ref Range   Sodium 159 (H)  135 - 145 mmol/L   Potassium 4.0 3.5 - 5.1 mmol/L   Chloride 130 (H) 101 - 111 mmol/L   CO2 26 22 - 32 mmol/L   Glucose, Bld 251 (H) 65 - 99 mg/dL   BUN 20 6 - 20 mg/dL   Creatinine, Ser 1.77 (H) 0.44 - 1.00 mg/dL    Comment: DELTA CHECK NOTED   Calcium 8.2 (L) 8.9 - 10.3 mg/dL   GFR calc non Af Amer 31 (L) >60 mL/min   GFR calc Af Amer 35 (L) >60 mL/min    Comment: (NOTE) The eGFR has been calculated using the CKD EPI equation. This calculation has not been validated in all clinical situations. eGFR's persistently <60 mL/min signify possible Chronic Kidney Disease.    Anion gap 3 (L) 5 - 15  APTT     Status: None   Collection Time: 04/09/15 11:08 PM  Result Value Ref Range   aPTT 26 24 - 37 seconds  Protime-INR     Status: Abnormal   Collection Time: 04/09/15 11:08 PM  Result Value Ref Range   Prothrombin Time 16.3 (H) 11.6 - 15.2 seconds   INR 1.30 0.00 - 1.49  Troponin I     Status: None   Collection Time: 04/09/15 11:08 PM  Result Value Ref Range   Troponin I <0.03 <0.031 ng/mL    Comment:        NO INDICATION OF MYOCARDIAL INJURY.   Lactic acid, plasma     Status: Abnormal   Collection Time: 04/10/15 12:05 AM  Result Value Ref Range   Lactic Acid, Venous 3.3 (HH) 0.5 - 2.0 mmol/L    Comment: CRITICAL RESULT CALLED TO, READ BACK BY AND VERIFIED WITH: PIEL,M RN 04/10/2015 0137 JORDANS REPEATED TO VERIFY   Glucose, capillary     Status: Abnormal   Collection Time: 04/10/15 12:10 AM  Result Value Ref Range   Glucose-Capillary 167 (H) 65 - 99 mg/dL  Glucose, capillary     Status: Abnormal   Collection Time: 04/10/15  2:26 AM  Result Value Ref Range   Glucose-Capillary 194 (H) 65 - 99 mg/dL  Basic metabolic panel (stat then every 4 hours)     Status: Abnormal   Collection Time: 04/10/15  3:10 AM  Result Value Ref Range   Sodium 159 (H) 135 - 145 mmol/L   Potassium 3.9 3.5 - 5.1 mmol/L   Chloride >130 (HH) 101 - 111 mmol/L    Comment: CRITICAL RESULT CALLED TO,  READ BACK BY AND VERIFIED WITH: PIEL,M RN 04/10/2015 0529 JORDANS  CO2 25 22 - 32 mmol/L   Glucose, Bld 195 (H) 65 - 99 mg/dL   BUN 20 6 - 20 mg/dL   Creatinine, Ser 1.58 (H) 0.44 - 1.00 mg/dL   Calcium 8.1 (L) 8.9 - 10.3 mg/dL   GFR calc non Af Amer 35 (L) >60 mL/min   GFR calc Af Amer 41 (L) >60 mL/min    Comment: (NOTE) The eGFR has been calculated using the CKD EPI equation. This calculation has not been validated in all clinical situations. eGFR's persistently <60 mL/min signify possible Chronic Kidney Disease.    Anion gap NOT CALCULATED 5 - 15  TSH     Status: Abnormal   Collection Time: 04/10/15  3:10 AM  Result Value Ref Range   TSH 0.060 (L) 0.350 - 4.500 uIU/mL  Troponin I     Status: None   Collection Time: 04/10/15  3:10 AM  Result Value Ref Range   Troponin I <0.03 <0.031 ng/mL    Comment:        NO INDICATION OF MYOCARDIAL INJURY.   Glucose, capillary     Status: Abnormal   Collection Time: 04/10/15  4:01 AM  Result Value Ref Range   Glucose-Capillary 146 (H) 65 - 99 mg/dL  Glucose, capillary     Status: Abnormal   Collection Time: 04/10/15  5:03 AM  Result Value Ref Range   Glucose-Capillary 111 (H) 65 - 99 mg/dL  Protime-INR     Status: Abnormal   Collection Time: 04/10/15  6:00 AM  Result Value Ref Range   Prothrombin Time 15.8 (H) 11.6 - 15.2 seconds   INR 1.25 0.00 - 1.49  APTT     Status: None   Collection Time: 04/10/15  6:00 AM  Result Value Ref Range   aPTT 27 24 - 37 seconds  Glucose, capillary     Status: Abnormal   Collection Time: 04/10/15  6:07 AM  Result Value Ref Range   Glucose-Capillary 149 (H) 65 - 99 mg/dL   Dg Chest Port 1 View  04/09/2015   CLINICAL DATA:  Weakness and dizziness.  Low blood pressure.  EXAM: PORTABLE CHEST - 1 VIEW  COMPARISON:  None.  FINDINGS: Stable enlarged cardiac silhouette. There are low lung volumes and basilar atelectasis. No effusion, infiltrate, pneumothorax.  IMPRESSION: Cardiomegaly and low lung  volumes.  No acute findings   Electronically Signed   By: Suzy Bouchard M.D.   On: 04/09/2015 17:16    Assessment: 1. Dehydration, probably multifactorial 2. Oral thrush with probable candidal esophagitis 3. Autoimmune hepatitis 4. Probable superimposed shock liver Plan:  1. Await today's labs 2. Continue moderate dose prednisone 3. Holding azathioprine briefly but will probably resume once she is significantly dehydrated and labs improve 4. Continue Diflucan and if significant odynophagia persists over the next 1-2 days, consider endoscopy. 5. Will go ahead and advance diet beginning with full liquid diet. 6. Abdominal ultrasound ordered and pending Latresa Gasser C 04/10/2015, 7:55 AM  Pager [phone] If no answer or after 5 PM call [phone]

## 2015-04-11 DIAGNOSIS — E872 Acidosis: Secondary | ICD-10-CM

## 2015-04-11 LAB — COMPREHENSIVE METABOLIC PANEL WITH GFR
ALT: 859 U/L — ABNORMAL HIGH (ref 14–54)
AST: 611 U/L — ABNORMAL HIGH (ref 15–41)
Albumin: 2.4 g/dL — ABNORMAL LOW (ref 3.5–5.0)
Alkaline Phosphatase: 168 U/L — ABNORMAL HIGH (ref 38–126)
Anion gap: 5 (ref 5–15)
BUN: 10 mg/dL (ref 6–20)
CO2: 25 mmol/L (ref 22–32)
Calcium: 8.5 mg/dL — ABNORMAL LOW (ref 8.9–10.3)
Chloride: 120 mmol/L — ABNORMAL HIGH (ref 101–111)
Creatinine, Ser: 1.23 mg/dL — ABNORMAL HIGH (ref 0.44–1.00)
GFR calc Af Amer: 55 mL/min — ABNORMAL LOW
GFR calc non Af Amer: 47 mL/min — ABNORMAL LOW
Glucose, Bld: 284 mg/dL — ABNORMAL HIGH (ref 65–99)
Potassium: 3.9 mmol/L (ref 3.5–5.1)
Sodium: 150 mmol/L — ABNORMAL HIGH (ref 135–145)
Total Bilirubin: 1.7 mg/dL — ABNORMAL HIGH (ref 0.3–1.2)
Total Protein: 6.2 g/dL — ABNORMAL LOW (ref 6.5–8.1)

## 2015-04-11 LAB — GLUCOSE, CAPILLARY
GLUCOSE-CAPILLARY: 396 mg/dL — AB (ref 65–99)
Glucose-Capillary: 325 mg/dL — ABNORMAL HIGH (ref 65–99)
Glucose-Capillary: 361 mg/dL — ABNORMAL HIGH (ref 65–99)
Glucose-Capillary: 305 mg/dL — ABNORMAL HIGH (ref 65–99)

## 2015-04-11 LAB — CBC
HEMATOCRIT: 37.1 % (ref 36.0–46.0)
Hemoglobin: 12.3 g/dL (ref 12.0–15.0)
MCH: 27.8 pg (ref 26.0–34.0)
MCHC: 33.2 g/dL (ref 30.0–36.0)
MCV: 83.9 fL (ref 78.0–100.0)
Platelets: 145 10*3/uL — ABNORMAL LOW (ref 150–400)
RBC: 4.42 MIL/uL (ref 3.87–5.11)
RDW: 20.3 % — ABNORMAL HIGH (ref 11.5–15.5)
WBC: 8.1 10*3/uL (ref 4.0–10.5)

## 2015-04-11 LAB — HEMOGLOBIN A1C
Hgb A1c MFr Bld: 10.1 % — ABNORMAL HIGH (ref 4.8–5.6)
Mean Plasma Glucose: 243 mg/dL

## 2015-04-11 LAB — AMMONIA: Ammonia: 31 umol/L (ref 9–35)

## 2015-04-11 LAB — HEPATITIS PANEL, ACUTE
HCV Ab: 0.2 s/co ratio (ref 0.0–0.9)
HEP B C IGM: NEGATIVE
HEP B S AG: NEGATIVE
Hep A IgM: NEGATIVE

## 2015-04-11 MED ORDER — CARVEDILOL 6.25 MG PO TABS
6.2500 mg | ORAL_TABLET | Freq: Two times a day (BID) | ORAL | Status: DC
  Administered 2015-04-11 – 2015-04-13 (×4): 6.25 mg via ORAL
  Filled 2015-04-11: qty 1
  Filled 2015-04-11: qty 2
  Filled 2015-04-11: qty 1
  Filled 2015-04-11: qty 2
  Filled 2015-04-11: qty 1

## 2015-04-11 NOTE — Progress Notes (Signed)
Some dygeusia, but no odynophagia. Generally, feels better.  Liver chemistries are dropping approximately 15% per day.  No oral thrush seen.  Impression: Doing okay from GI perspective. Agree with the impression that a component of the patient's elevated liver chemistries was probably "shock liver."  Recommendation: No change in therapy needed. Okay to advance diet from my standpoint. Can probably go home in a day or so; we can follow liver chemistries as an outpatient (Dr. Madilyn Fireman).  Dana Thornton, M.D. Pager [phone] If no answer or after 5 PM call [phone]

## 2015-04-11 NOTE — Progress Notes (Signed)
Inpatient Diabetes Program Recommendations  AACE/ADA: New Consensus Statement on Inpatient Glycemic Control (2013)  Target Ranges:  Prepandial:   less than 140 mg/dL      Peak postprandial:   less than 180 mg/dL (1-2 hours)      Critically ill patients:  140 - 180 mg/dL   Results for LADAIJA, DIMINO (MRN [id number]) as of 04/11/2015 09:47  Ref. Range 04/10/2015 10:20 04/10/2015 12:03 04/10/2015 16:51 04/10/2015 21:37 04/11/2015 07:54  Glucose-Capillary Latest Ref Range: 65-99 mg/dL 409 (H) 811 (H) 914 (H) 315 (H) 325 (H)   Reason for Admission: Hyperglycemia  Diabetes history: DM 2 Outpatient Diabetes medications: Lantus 32 units QHS, Novolog 0-10 units TID Current orders for Inpatient glycemic control: Lantus 30 units BID, Novolog Resistant + HS scale  Patient also receiving 40 mg PO prednisone.  Inpatient Diabetes Program Recommendations Insulin - Basal: Patient received a total of 50 units basal yesterday and was still 325 mg/dl this am. Patient to get 2 doses of Lantus 30 units today. If glucose continues to be elevated consider increasing basal to 35-40 units BID. Hyperglycemia may be a result from the Hepatitis patient has.  Thanks,  Christena Deem RN, MSN, [facility] Inpatient Diabetes Coordinator Team Pager [phone] (8am-5pm)

## 2015-04-11 NOTE — Progress Notes (Signed)
Nutrition Brief Note  Patient identified on the Malnutrition Screening Tool (MST) Report.  Wt Readings from Last 15 Encounters:  04/11/15 242 lb 12.8 oz (110.133 kg)  04/01/15 242 lb 8 oz (109.997 kg)  12/09/11 243 lb 1.6 oz (110.269 kg)    Body mass index is 44.4 kg/(m^2). Patient meets criteria for Obesity Class III based on current BMI.   Current diet order is Full Liquids, patient is consuming approximately 100% of meals at this time. Labs and medications reviewed.   No nutrition interventions warranted at this time. If nutrition issues arise, please consult RD.   Maureen Chatters, RD, LDN Pager #: [phone] After-Hours Pager #: [phone]

## 2015-04-11 NOTE — Progress Notes (Signed)
[facility] PROGRESS NOTE  Dana Thornton:[id number] DOB: November 18, 1955 DOA: 04/09/2015  PCP: Astrid Divine, MD  Brief HPI: 59 year old African-American female with a past medical history of diabetes on insulin, autoimmune hepatitis who was hospitalized recently, about a week ago for uncontrolled diabetes. She was diagnosed with oral thrush. He was discharged on nystatin and Diflucan. She is on steroids for autoimmune hepatitis, which has been continued. She presented to the hospital with complaints of high blood sugar. She was noted to be hypotensive in the ED. There was suspicion of mild DKA. She was admitted for further management.  Past medical history:  Past Medical History  Diagnosis Date  . Hypertension   . Kidney stones   . Hypercholesterolemia   . Type II diabetes mellitus     Consultants: [facility]  Procedures: None  Antibiotics: None  Subjective: Patient feels better this morning. Continues to have painful swallowing. Denies any chest pain or shortness of breath.   Objective: Vital Signs  Filed Vitals:   04/10/15 1934 04/10/15 2337 04/11/15 0438 04/11/15 0500  BP: 155/70 153/84 138/68   Pulse: 84 85 70   Temp: 97.4 F (36.3 C) 97.9 F (36.6 C) 97.7 F (36.5 C)   TempSrc: Oral Oral Oral   Resp: Height:      Weight:    110.133 kg (242 lb 12.8 oz)  SpO2: 98% 98% 98%     Intake/Output Summary (Last 24 hours) at 04/11/15 0727 Last data filed at 04/11/15 0600  Gross per 24 hour  Intake 3661.7 ml  Output   1100 ml  Net 2561.7 ml   Filed Weights   04/09/15 2030 04/11/15 0500  Weight: 106.9 kg (235 lb 10.8 oz) 110.133 kg (242 lb 12.8 oz)    General appearance: alert, cooperative, appears stated age and no distress Throat: White plaques noted today over the uvula. Resp: clear to auscultation bilaterally Cardio: regular rate and rhythm, S1, S2 normal, no murmur, click, rub or gallop GI: soft, non-tender; bowel  sounds normal; no masses,  no organomegaly Neurologic: Alert and oriented X 3, no focal deficits  Lab Results:  Basic Metabolic Panel:  Recent Labs Lab 04/09/15 2308 04/10/15 0310 04/10/15 0600 04/10/15 1709 04/11/15 0257  NA 159* 159* 158* 151* 150*  K 4.0 3.9 4.8 4.6 3.9  CL 130* >130* >130* 121* 120*  CO2 GLUCOSE 251* 195* 141* 382* 284*  BUN CREATININE 1.77* 1.58* 1.51* 1.40* 1.23*  CALCIUM 8.2* 8.1* 8.1* 8.4* 8.5*   Liver Function Tests:  Recent Labs Lab 04/09/15 1654 04/10/15 0600 04/11/15 0257  AST 829* 700* 611*  ALT 1250* 981* 859*  ALKPHOS 236* 164* 168*  BILITOT 2.4* 1.8* 1.7*  PROT 7.9 6.7 6.2*  ALBUMIN 3.0* 2.5* 2.4*    Recent Labs Lab 04/09/15 1654  LIPASE 35    Recent Labs Lab 04/09/15 1950 04/11/15 0257  AMMONIA 44* 31   CBC:  Recent Labs Lab 04/09/15 1654 04/10/15 0600 04/11/15 0257  WBC 7.9 9.0 8.1  NEUTROABS 4.8  --   --   HGB 15.1* 13.4 12.3  HCT 46.3* 41.5 37.1  MCV 85.4 86.5 83.9  PLT 194 162 145*   Cardiac Enzymes:  Recent Labs Lab 04/09/15 2308 04/10/15 0310 04/10/15 0941  TROPONINI <0.03 <0.03 <0.03   CBG:  Recent Labs Lab 04/10/15 0607 04/10/15 1020 04/10/15 1203 04/10/15 1651 04/10/15 2137  GLUCAP 149*  255* 250* 303* 315*    Recent Results (from the past 240 hour(s))  MRSA PCR Screening     Status: None   Collection Time: 04/09/15  8:27 PM  Result Value Ref Range Status   MRSA by PCR NEGATIVE NEGATIVE Final    Comment:        The GeneXpert MRSA Assay (FDA approved for NASAL specimens only), is one component of a comprehensive MRSA colonization surveillance program. It is not intended to diagnose MRSA infection nor to guide or monitor treatment for MRSA infections.       Studies/Results: US Abdomen Complete  04/10/2015   CLINICAL DATA:  Transaminitis  EXAM: ULTRASOUND ABDOMEN COMPLETE  COMPARISON:  CT 12/03/2011  FINDINGS: Gallbladder: Small amount of  sludge within the gallbladder. No wall thickening. No stones. Negative sonographic Murphy's.  Common bile duct: Diameter: Normal caliber, 4 mm  Liver: No focal lesion identified. Within normal limits in parenchymal echogenicity.  IVC: No abnormality visualized.  Pancreas: Visualized portion unremarkable.  Spleen: Size and appearance within normal limits.  Right Kidney: Length: 10.3 cm. Multiple cysts, the largest 3.4 cm. Echogenicity within normal limits. No mass or hydronephrosis visualized.  Left Kidney: Length: 10.8 cm. Multiple cysts, the largest 1.3 cm. Echogenicity within normal limits. No mass or hydronephrosis visualized.  Abdominal aorta: No aneurysm visualized.  Other findings: Incidentally noted is a small right pleural effusion.  IMPRESSION: Small amount of sludge in the gallbladder. No stones or evidence of acute cholecystitis.  Bilateral renal cysts.  Right pleural effusion.   Electronically Signed   By: Charlett Nose M.D.   On: 04/10/2015 09:36   Dg Chest Port 1 View  04/09/2015   CLINICAL DATA:  Weakness and dizziness.  Low blood pressure.  EXAM: PORTABLE CHEST - 1 VIEW  COMPARISON:  None.  FINDINGS: Stable enlarged cardiac silhouette. There are low lung volumes and basilar atelectasis. No effusion, infiltrate, pneumothorax.  IMPRESSION: Cardiomegaly and low lung volumes.  No acute findings   Electronically Signed   By: Genevive Bi M.D.   On: 04/09/2015 17:16    Medications:  Scheduled: . antiseptic oral rinse  7 mL Mouth Rinse BID  . fluconazole (DIFLUCAN) IV  200 mg Intravenous Q24H  . heparin  5,000 Units Subcutaneous 3 times per day  . insulin aspart  0-20 Units Subcutaneous TID WC  . insulin aspart  0-5 Units Subcutaneous QHS  . insulin glargine  30 Units Subcutaneous BID  . levothyroxine  150 mcg Oral QAC breakfast  . nystatin  5 mL Oral QID  . predniSONE  40 mg Oral Q breakfast  . sodium chloride  3 mL Intravenous Q12H   Continuous: . dextrose 100 mL/hr at 04/11/15 0506    ZOX:[id number] **OR** ondansetron (ZOFRAN) IV  Assessment/Plan:  Principal Problem:   Hyperglycemia Active Problems:   Autoimmune hepatitis   Hypernatremia   DKA (diabetic ketoacidoses)   Hypotension   ARF (acute renal failure)   Transaminitis   Shock liver   Lactic acidosis    Hyperglycemia, possible mild DKA Patient was transitioned to Lantus yesterday. Blood sugars still running high, likely secondary to steroids, as well as the D5 that she is getting. 4. Hypernatremia. Continue to up titrate Lantus. HbA1c is pending. Her initial presentation revealed significant hyperglycemia with mildly elevated anion gap. Beta hydroxybutyrate was not significantly elevated. Patient's hyperglycemia improved with IV insulin. Anion gap which was only mildly elevated is now normal.   Transaminitis in the setting of known history of  autoimmune hepatitis LFTs are improving. Still significantly higher compared to last week. This was in the setting of hypotension. Thought to be secondary to shock liver. Gastroenterology is following. Steroids are being continued at a slightly lower dose. Her mercaptopurine has been held for now. Ultrasound the abdomen is has above. Hepatitis panel is pending. INR is normal.  Hypotension Blood pressure has improved and now in the hypertensive range. Hypotension was most likely due to dehydration and the fact that she was taking 3 different antihypertensives agents. Okay to resume carvedilol for now.   Lactic acidosis Most likely due to hypovolemia. Levels improved. No evidence for infection at this time. UA does not suggest infection. Chest x-ray did not show any infiltrates. She is afebrile with a normal white count. Continue to monitor  Oral thrush/Odynophagia She reports odynophagia. Currently on nystatin and Diflucan. GI is following and may consider endoscopy. Continue liquid diet.  Acute encephalopathy Now back to baseline. This was most likely due to acute  illness dehydrated state and hyperglycemia. She did not have any focal deficits. Ammonia level was mildly elevated.   Abnormal EKG Troponins normal. Repeat EKG does not show any change compared to previous. Patient denies any chest pain. Anticipate no further workup at this time.  Hypernatremia Corrected sodium was 162 at the time of admission. Sodium level is still elevated but improved. Continue D5 water. Cut down the rate. Possibly due to dehydrated state.  Acute renal failure Most likely prerenal from hypovolemia and dehydration. Renal function has improved with aggressive hydration. Monitor urine output.   Hypothyroidism TSH noted to be low. Free T4 is elevated. We'll cut back on the dose of her Synthroid.  Morbid obesity BMI is around 44   DVT Prophylaxis: subcutaneous heparin    Code Status:Full code Family Communication: Discussed with patient Disposition Plan: Okay for transfer to floor     LOS: 2 days   [facility]  [facility] Pager [phone] 04/11/2015, 7:27 AM  If 7PM-7AM, please contact night-coverage at [email or web address], password [facility]

## 2015-04-11 NOTE — Progress Notes (Signed)
Patient is being transferred to 5W39 in stable condition. Report given to [facility] @ 1035. All questions/concerns answered. Belongings taken with patient. Plan of care to be continued upon arrival to the floor.

## 2015-04-11 NOTE — Progress Notes (Signed)
PT Screen note:  PT to see pt this afternoon and pt stating that she is moving around fine and will not need PT services.  Will sign off at this time.    Harriet Butte, PT

## 2015-04-12 DIAGNOSIS — E1165 Type 2 diabetes mellitus with hyperglycemia: Secondary | ICD-10-CM

## 2015-04-12 LAB — COMPREHENSIVE METABOLIC PANEL
ALK PHOS: 191 U/L — AB (ref 38–126)
ALT: 806 U/L — AB (ref 14–54)
AST: 529 U/L — ABNORMAL HIGH (ref 15–41)
Albumin: 2.5 g/dL — ABNORMAL LOW (ref 3.5–5.0)
Anion gap: 11 (ref 5–15)
BUN: 6 mg/dL (ref 6–20)
CHLORIDE: 110 mmol/L (ref 101–111)
CO2: 23 mmol/L (ref 22–32)
CREATININE: 1.1 mg/dL — AB (ref 0.44–1.00)
Calcium: 8.5 mg/dL — ABNORMAL LOW (ref 8.9–10.3)
GFR calc Af Amer: >60 mL/min (ref >60)
GFR calc non Af Amer: 54 mL/min — ABNORMAL LOW (ref >60)
Glucose, Bld: 209 mg/dL — ABNORMAL HIGH (ref 65–99)
Potassium: 3.3 mmol/L — ABNORMAL LOW (ref 3.5–5.1)
SODIUM: 144 mmol/L (ref 135–145)
TOTAL PROTEIN: 6.5 g/dL (ref 6.5–8.1)
Total Bilirubin: 1.7 mg/dL — ABNORMAL HIGH (ref 0.3–1.2)

## 2015-04-12 LAB — GLUCOSE, CAPILLARY
GLUCOSE-CAPILLARY: 345 mg/dL — AB (ref 65–99)
Glucose-Capillary: 244 mg/dL — ABNORMAL HIGH (ref 65–99)
Glucose-Capillary: 299 mg/dL — ABNORMAL HIGH (ref 65–99)
Glucose-Capillary: 321 mg/dL — ABNORMAL HIGH (ref 65–99)

## 2015-04-12 LAB — CBC
HCT: 39.3 % (ref 36.0–46.0)
Hemoglobin: 13 g/dL (ref 12.0–15.0)
MCH: 27.5 pg (ref 26.0–34.0)
MCHC: 33.1 g/dL (ref 30.0–36.0)
MCV: 83.3 fL (ref 78.0–100.0)
Platelets: 151 10*3/uL (ref 150–400)
RBC: 4.72 MIL/uL (ref 3.87–5.11)
RDW: 19.5 % — ABNORMAL HIGH (ref 11.5–15.5)
WBC: 7.9 10*3/uL (ref 4.0–10.5)

## 2015-04-12 MED ORDER — POTASSIUM CHLORIDE CRYS ER 20 MEQ PO TBCR
40.0000 meq | EXTENDED_RELEASE_TABLET | Freq: Once | ORAL | Status: AC
  Administered 2015-04-12: 40 meq via ORAL
  Filled 2015-04-12: qty 2

## 2015-04-12 MED ORDER — FLUCONAZOLE 100 MG PO TABS
200.0000 mg | ORAL_TABLET | Freq: Every day | ORAL | Status: DC
  Administered 2015-04-12 – 2015-04-13 (×2): 200 mg via ORAL
  Filled 2015-04-12 (×2): qty 2

## 2015-04-12 NOTE — Progress Notes (Signed)
Patient's liver chemistries continued to show very gradual improvement.  The patient has eaten 2 meals of solid food without any difficulty, specifically, finishing most of the food, no odynophagia, no dysphagia, no gagging. I happened to come in while she was eating her dinner, and she was eating without any evident difficulty and appeared to be enjoying it very much.  The patient's husband had expressed concerns today, via a telephone call to our office, regarding the possible need for endoscopic evaluation prior to discharge, keeping in mind that the patient was recently discharged with some residual dysphagia symptoms and food intolerance, and ended up having to be readmitted. I called him on the phone and spoke with him for about 10 minutes, explaining that the wife's eating difficulties seem to have completely resolved, and that I did not feel that endoscopy would change her management or clarify her clinical picture, but would expose her to a little bit of risk, as well as expense and possible delay of discharge. After this discussion, he and I both felt that such endoscopic evaluation was not currently necessary.  Recommendation:  1. Okay for discharge tomorrow from GI standpoint, as long as liver chemistries continue to show improvement and the patient does not have any "backsliding" with respect to her ability to eat  2. Have instructed the patient and her family to be on the lookout for any evidence of recurrent dysphagia symptoms or food intolerance, so that it could be addressed promptly by dietary interventions or medication. They are aware that the azathioprine she needs for her autoimmune hepatitis does place her at risk for recurrent candidiasis.  3. Dr. Dorena Cookey, the patient's primary gastroenterologist, has requested that she be restarted on her azathioprine at the time of discharge.   4. The patient should follow-up with Dr. Madilyn Fireman' office by telephone later this week to receive  instruction about timing of blood tests and office follow-up.  Dana Thornton, M.D. Pager [phone] If no answer or after 5 PM call [phone]

## 2015-04-12 NOTE — Progress Notes (Signed)
OT Cancellation Note  Patient Details Name: Dana Thornton MRN: [id number] DOB: 08/17/1956   Cancelled Treatment:    Reason Eval/Treat Not Completed: OT screened, no needs identified, will sign off. Spoke with pt and she has no OT concerns. Will sign off.  Earlie Raveling OTR/L [phone] 04/12/2015, 2:23 PM

## 2015-04-12 NOTE — Progress Notes (Signed)
GI-if pt euvolemic, and hyperglycemia, acidosis, azotemia resolved, I would have pt resume taking her azathioprine upon discharge

## 2015-04-12 NOTE — Progress Notes (Addendum)
[facility] PROGRESS NOTE  Dana Thornton ZOX:[id number] DOB: July 30, 1956 DOA: 04/09/2015  PCP: Astrid Divine, MD  Brief HPI: 59 year old African-American female with a past medical history of diabetes on insulin, autoimmune hepatitis who was hospitalized recently, about a week ago for uncontrolled diabetes. She was diagnosed with oral thrush. He was discharged on nystatin and Diflucan. She is on steroids for autoimmune hepatitis, which has been continued. She presented to the hospital with complaints of high blood sugar. She was noted to be hypotensive in the ED. There was suspicion of mild DKA. She was admitted for further management.  Past medical history:  Past Medical History  Diagnosis Date  . Hypertension   . Kidney stones   . Hypercholesterolemia   . Type II diabetes mellitus     Consultants: [facility]  Procedures: None  Antibiotics: None  Subjective: Patient feels better this morning. Asking for solid food. Able to swallow liquid diet without any difficulty. No other complaints offered.   Objective: Vital Signs  Filed Vitals:   04/11/15 1454 04/11/15 2217 04/12/15 0500 04/12/15 0555  BP: 141/83 138/63  161/79  Pulse: 82 67  64  Temp: 98 F (36.7 C) 98.2 F (36.8 C)  97.6 F (36.4 C)  TempSrc: Oral Oral  Oral  Resp: Height:      Weight:   112.855 kg (248 lb 12.8 oz)   SpO2: 98% 100%  100%    Intake/Output Summary (Last 24 hours) at 04/12/15 1052 Last data filed at 04/12/15 0659  Gross per 24 hour  Intake    400 ml  Output   1300 ml  Net   -900 ml   Filed Weights   04/09/15 2030 04/11/15 0500 04/12/15 0500  Weight: 106.9 kg (235 lb 10.8 oz) 110.133 kg (242 lb 12.8 oz) 112.855 kg (248 lb 12.8 oz)    General appearance: alert, cooperative, appears stated age and no distress Resp: clear to auscultation bilaterally Cardio: regular rate and rhythm, S1, S2 normal, no murmur, click, rub or gallop GI: soft,  non-tender; bowel sounds normal; no masses,  no organomegaly Neurologic: Alert and oriented X 3, no focal deficits  Lab Results:  Basic Metabolic Panel:  Recent Labs Lab 04/10/15 0310 04/10/15 0600 04/10/15 1709 04/11/15 0257 04/12/15 0537  NA 159* 158* 151* 150* 144  K 3.9 4.8 4.6 3.9 3.3*  CL >130* >130* 121* 120* 110  CO2 GLUCOSE 195* 141* 382* 284* 209*  BUN CREATININE 1.58* 1.51* 1.40* 1.23* 1.10*  CALCIUM 8.1* 8.1* 8.4* 8.5* 8.5*   Liver Function Tests:  Recent Labs Lab 04/09/15 1654 04/10/15 0600 04/11/15 0257 04/12/15 0537  AST 829* 700* 611* 529*  ALT 1250* 981* 859* 806*  ALKPHOS 236* 164* 168* 191*  BILITOT 2.4* 1.8* 1.7* 1.7*  PROT 7.9 6.7 6.2* 6.5  ALBUMIN 3.0* 2.5* 2.4* 2.5*    Recent Labs Lab 04/09/15 1654  LIPASE 35    Recent Labs Lab 04/09/15 1950 04/11/15 0257  AMMONIA 44* 31   CBC:  Recent Labs Lab 04/09/15 1654 04/10/15 0600 04/11/15 0257 04/12/15 0537  WBC 7.9 9.0 8.1 7.9  NEUTROABS 4.8  --   --   --   HGB 15.1* 13.4 12.3 13.0  HCT 46.3* 41.5 37.1 39.3  MCV 85.4 86.5 83.9 83.3  PLT 194 162 145* 151   Cardiac Enzymes:  Recent Labs Lab 04/09/15 2308 04/10/15 0310 04/10/15  0941  TROPONINI <0.03 <0.03 <0.03   CBG:  Recent Labs Lab 04/11/15 0754 04/11/15 1206 04/11/15 1658 04/11/15 2215 04/12/15 0750  GLUCAP 325* 361* 396* 305* 244*    Recent Results (from the past 240 hour(s))  MRSA PCR Screening     Status: None   Collection Time: 04/09/15  8:27 PM  Result Value Ref Range Status   MRSA by PCR NEGATIVE NEGATIVE Final    Comment:        The GeneXpert MRSA Assay (FDA approved for NASAL specimens only), is one component of a comprehensive MRSA colonization surveillance program. It is not intended to diagnose MRSA infection nor to guide or monitor treatment for MRSA infections.       Studies/Results: No results found.  Medications:  Scheduled: . antiseptic oral  rinse  7 mL Mouth Rinse BID  . carvedilol  6.25 mg Oral BID WC  . fluconazole (DIFLUCAN) IV  200 mg Intravenous Q24H  . heparin  5,000 Units Subcutaneous 3 times per day  . insulin aspart  0-20 Units Subcutaneous TID WC  . insulin aspart  0-5 Units Subcutaneous QHS  . insulin glargine  30 Units Subcutaneous BID  . levothyroxine  150 mcg Oral QAC breakfast  . nystatin  5 mL Oral QID  . potassium chloride  40 mEq Oral Once  . predniSONE  40 mg Oral Q breakfast  . sodium chloride  3 mL Intravenous Q12H   Continuous:   ZOX:[id number] **OR** ondansetron (ZOFRAN) IV  Assessment/Plan:  Principal Problem:   Hyperglycemia Active Problems:   Autoimmune hepatitis   Hypernatremia   DKA (diabetic ketoacidoses)   Hypotension   ARF (acute renal failure)   Transaminitis   Shock liver   Lactic acidosis    Uncontrolled type 2 diabetes/Hyperglycemia, possible mild DKA Blood sugars still high but stable. Discontinuation of D5 order will also help reduce her blood sugar levels. Continue Lantus at current dose. HbA1c is 10.1. Steroids also contributing, but dose of steroids cannot be reduced any further at this time. Her initial presentation revealed significant hyperglycemia with mildly elevated anion gap. Beta hydroxybutyrate was not significantly elevated. Patient's hyperglycemia improved with IV insulin. Anion gap which was only mildly elevated is now normal.   Transaminitis in the setting of known history of autoimmune hepatitis LFTs are improving. Still significantly higher compared to last week. This was in the setting of hypotension. Thought to be secondary to shock liver. Gastroenterology has been following. Steroids are being continued at a slightly lower dose. Her mercaptopurine has been held for now. Ultrasound the abdomen is as above. Hepatitis panel is negative. INR is normal.  History of essential hypertension, presenting with hypotension Blood pressure has improved and now in the  hypertensive range. Hypotension was most likely due to dehydration and the fact that she was taking 3 different antihypertensives agents. She was started back on carvedilol. May need to start her back on amlodipine as well.  Lactic acidosis Most likely due to hypovolemia. Levels improved. No evidence for infection at this time. UA does not suggest infection. Chest x-ray did not show any infiltrates. She is afebrile with a normal white count. Continue to monitor  Oral thrush/Odynophagia She reported odynophagia. Currently on nystatin and Diflucan. Swallowing has improved. GI does not plan any endoscopy. Patient requesting solid foods. We'll advance to soft diet.   Acute encephalopathy Now back to baseline. This was most likely due to acute illness dehydrated state and hyperglycemia. She did not have any focal  deficits. Ammonia level was mildly elevated. No clear indication to start lactulose.  Abnormal EKG Troponins normal. Repeat EKG does not show any change compared to previous. Patient denies any chest pain. Anticipate no further workup at this time.  Hypernatremia Corrected sodium was 162 at the time of admission. Sodium level is now normal. Stop IV fluids.   Acute renal failure Much improved. Most likely prerenal from hypovolemia and dehydration. Renal function has improved with aggressive hydration. Monitor urine output.   Hypothyroidism TSH noted to be low. Free T4 is elevated. Her dose of Synthroid was reduced.   Morbid obesity BMI is around 44   DVT Prophylaxis: subcutaneous heparin    Code Status:Full code Family Communication: Discussed with patient Disposition Plan: Advance diet today. Stop D5 water. If she tolerates her diet and blood sugars are reasonably well controlled, she could be discharged tomorrow.     LOS: 3 days   [facility]  [facility] Pager [phone] 04/12/2015, 10:52 AM  If 7PM-7AM, please contact night-coverage at [email or web address], password  [facility]

## 2015-04-13 LAB — COMPREHENSIVE METABOLIC PANEL
ALBUMIN: 2.3 g/dL — AB (ref 3.5–5.0)
ALT: 752 U/L — AB (ref 14–54)
AST: 574 U/L — ABNORMAL HIGH (ref 15–41)
Alkaline Phosphatase: 181 U/L — ABNORMAL HIGH (ref 38–126)
Anion gap: 9 (ref 5–15)
BILIRUBIN TOTAL: 1.7 mg/dL — AB (ref 0.3–1.2)
BUN: <5 mg/dL — ABNORMAL LOW (ref 6–20)
CO2: 23 mmol/L (ref 22–32)
Calcium: 8.2 mg/dL — ABNORMAL LOW (ref 8.9–10.3)
Chloride: 113 mmol/L — ABNORMAL HIGH (ref 101–111)
Creatinine, Ser: 0.98 mg/dL (ref 0.44–1.00)
Glucose, Bld: 67 mg/dL (ref 65–99)
Potassium: 3.7 mmol/L (ref 3.5–5.1)
SODIUM: 145 mmol/L (ref 135–145)
Total Protein: 5.9 g/dL — ABNORMAL LOW (ref 6.5–8.1)

## 2015-04-13 LAB — CBC
HCT: 37.4 % (ref 36.0–46.0)
Hemoglobin: 13.1 g/dL (ref 12.0–15.0)
MCH: 28.2 pg (ref 26.0–34.0)
MCHC: 35 g/dL (ref 30.0–36.0)
MCV: 80.6 fL (ref 78.0–100.0)
PLATELETS: 131 10*3/uL — AB (ref 150–400)
RBC: 4.64 MIL/uL (ref 3.87–5.11)
RDW: 19.2 % — AB (ref 11.5–15.5)
WBC: 4.4 10*3/uL (ref 4.0–10.5)

## 2015-04-13 LAB — GLUCOSE, CAPILLARY: GLUCOSE-CAPILLARY: 92 mg/dL (ref 65–99)

## 2015-04-13 MED ORDER — INSULIN GLARGINE 100 UNIT/ML ~~LOC~~ SOLN: SUBCUTANEOUS | Status: DC

## 2015-04-13 MED ORDER — INSULIN GLARGINE 100 UNIT/ML ~~LOC~~ SOLN
25.0000 [IU] | Freq: Two times a day (BID) | SUBCUTANEOUS | Status: DC
  Administered 2015-04-13: 25 [IU] via SUBCUTANEOUS
  Filled 2015-04-13 (×2): qty 0.25

## 2015-04-13 MED ORDER — PREDNISONE 20 MG PO TABS: 40.0000 mg | ORAL_TABLET | Freq: Every day | ORAL | Status: DC

## 2015-04-13 MED ORDER — LEVOTHYROXINE SODIUM 150 MCG PO TABS
150.0000 ug | ORAL_TABLET | Freq: Every day | ORAL | Status: DC
Start: 2015-04-13 — End: 2023-10-10

## 2015-04-13 MED ORDER — FLUCONAZOLE 100 MG PO TABS: 100.0000 mg | ORAL_TABLET | Freq: Every day | ORAL | Status: DC

## 2015-04-13 NOTE — Care Management Note (Signed)
Case Management Note  Patient Details  Name: Dana Thornton MRN: [id number] Date of Birth: Apr 21, 1956  Subjective/Objective:          Patient for dc today, no needs.          Action/Plan:   Expected Discharge Date:                  Expected Discharge Plan:  Home/Self Care  In-House Referral:     Discharge planning Services  CM Consult  Post Acute Care Choice:    Choice offered to:     DME Arranged:    DME Agency:     HH Arranged:    HH Agency:     Status of Service:  Completed, signed off  Medicare Important Message Given:    Date Medicare IM Given:    Medicare IM give by:    Date Additional Medicare IM Given:    Additional Medicare Important Message give by:     If discussed at Long Length of Stay Meetings, dates discussed:    Additional Comments:  Leone Haven, RN 04/13/2015, 3:10 PM

## 2015-04-13 NOTE — Discharge Summary (Signed)
[facility]  Physician Discharge Summary   Patient ID: Dana Thornton MRN: [id number] DOB/AGE: July 15, 1956 59 y.o.  Admit date: 04/09/2015 Discharge date: 04/13/2015  PCP: Astrid Divine, MD  DISCHARGE DIAGNOSES:  Principal Problem:   Hyperglycemia Active Problems:   Autoimmune hepatitis   Hypernatremia   DKA (diabetic ketoacidoses)   Hypotension   ARF (acute renal failure)   Transaminitis   Shock liver   Lactic acidosis   RECOMMENDATIONS FOR OUTPATIENT FOLLOW UP: 1. Patient's Lantus dose has been adjusted. 2. Outpatient follow-up for blood pressure management. And diabetes management.   DISCHARGE CONDITION: fair  Diet recommendation: Modified carbohydrate soft diet  Filed Weights   04/11/15 0500 04/12/15 0500 04/13/15 0500  Weight: 110.133 kg (242 lb 12.8 oz) 112.855 kg (248 lb 12.8 oz) 109.861 kg (242 lb 3.2 oz)    INITIAL HISTORY: 59 year old African-American female with a past medical history of diabetes on insulin, autoimmune hepatitis who was hospitalized recently, about a week ago for uncontrolled diabetes. She was diagnosed with oral thrush. He was discharged on nystatin and Diflucan. She is on steroids for autoimmune hepatitis, which has been continued. She presented to the hospital with complaints of high blood sugar. She was noted to be hypotensive in the ED. There was suspicion of mild DKA. She was admitted for further management.  Consultations: [facility] COURSE:   Uncontrolled type 2 diabetes/Hyperglycemia, possible mild DKA Patient was initially started on IV insulin. She was transitioned to subcutaneous insulin. Her blood sugars are uncontrolled due to steroids. Noncompliance could also be contributing. Insulin dosage has been adjusted. She did have an episode of hypoglycemia this morning. Repeat values are normal. HbA1c is 10.1. Steroid dose cannot be reduced any further at this time. Her initial presentation  revealed significant hyperglycemia with mildly elevated anion gap. Beta hydroxybutyrate was not significantly elevated. Patient's hyperglycemia improved with IV insulin. Anion gap which was only mildly elevated is now normal.   Transaminitis in the setting of known history of autoimmune hepatitis Noted to have significantly elevated LFTs at admission. She does have a history of autoimmune hepatitis and has had some degree of transaminitis. She was also profoundly hypotensive at initial presentation. Hence, it was felt that the sudden bump in her LFTs were secondary to shock liver. Gastroenterology has been following. Steroids are being continued at a slightly lower dose. Her mercaptopurine as held during this hospitalization. This can be resumed per gastroenterology. Ultrasound abdomen is as below. Hepatitis panel is negative. INR is normal.  History of essential hypertension, presenting with hypotension Initially hypotensive. Her anti-hypertensive agents were held. Blood pressure started creeping up. Carvedilol was resumed. Will need outpatient follow-up for same to adjust medications further.   Lactic acidosis Most likely due to hypovolemia. Levels improved. No evidence for infection at this time. UA does not suggest infection. Chest x-ray did not show any infiltrates. She is afebrile with a normal white count.   Oral thrush/Odynophagia She reported odynophagia. Currently on nystatin and Diflucan. Swallowing has improved. GI does not plan any endoscopy. She has been tolerating a soft diet. Continue with same. Continue Diflucan and nystatin. Follow-up with GI.  Acute encephalopathy Now back to baseline. This was most likely due to acute illness dehydrated state and hyperglycemia. She did not have any focal deficits. Ammonia level was mildly elevated. No clear indication to start lactulose.  Abnormal EKG Troponins normal. Repeat EKG does not show any change compared to previous. Patient denies any  chest pain.  Hypernatremia Corrected sodium was 162 at the time of admission. Sodium level is now normal.   Acute renal failure Much improved. Most likely prerenal from hypovolemia and dehydration. Renal function has improved with aggressive hydration.   Hypothyroidism TSH noted to be low. Free T4 is elevated. Her dose of Synthroid was reduced.   Morbid obesity BMI is around 44  Overall improved. Patient educated on her diabetes management. Okay for discharge today.   PERTINENT LABS:  The results of significant diagnostics from this hospitalization (including imaging, microbiology, ancillary and laboratory) are listed below for reference.    Microbiology: Recent Results (from the past 240 hour(s))  MRSA PCR Screening     Status: None   Collection Time: 04/09/15  8:27 PM  Result Value Ref Range Status   MRSA by PCR NEGATIVE NEGATIVE Final    Comment:        The GeneXpert MRSA Assay (FDA approved for NASAL specimens only), is one component of a comprehensive MRSA colonization surveillance program. It is not intended to diagnose MRSA infection nor to guide or monitor treatment for MRSA infections.      Labs: Basic Metabolic Panel:  Recent Labs Lab 04/10/15 0600 04/10/15 1709 04/11/15 0257 04/12/15 0537 04/13/15 0640  NA 158* 151* 150* 144 145  K 4.8 4.6 3.9 3.3* 3.7  CL >130* 121* 120* 110 113*  CO2 22 25 25 23 23   GLUCOSE 141* 382* 284* 209* 67  BUN 20 15 10 6  <5*  CREATININE 1.51* 1.40* 1.23* 1.10* 0.98  CALCIUM 8.1* 8.4* 8.5* 8.5* 8.2*   Liver Function Tests:  Recent Labs Lab 04/09/15 1654 04/10/15 0600 04/11/15 0257 04/12/15 0537 04/13/15 0640  AST 829* 700* 611* 529* 574*  ALT 1250* 981* 859* 806* 752*  ALKPHOS 236* 164* 168* 191* 181*  BILITOT 2.4* 1.8* 1.7* 1.7* 1.7*  PROT 7.9 6.7 6.2* 6.5 5.9*  ALBUMIN 3.0* 2.5* 2.4* 2.5* 2.3*    Recent Labs Lab 04/09/15 1654  LIPASE 35    Recent Labs Lab 04/09/15 1950 04/11/15 0257  AMMONIA  44* 31   CBC:  Recent Labs Lab 04/09/15 1654 04/10/15 0600 04/11/15 0257 04/12/15 0537 04/13/15 0640  WBC 7.9 9.0 8.1 7.9 4.4  NEUTROABS 4.8  --   --   --   --   HGB 15.1* 13.4 12.3 13.0 13.1  HCT 46.3* 41.5 37.1 39.3 37.4  MCV 85.4 86.5 83.9 83.3 80.6  PLT 194 162 145* 151 131*   Cardiac Enzymes:  Recent Labs Lab 04/09/15 2308 04/10/15 0310 04/10/15 0941  TROPONINI <0.03 <0.03 <0.03    CBG:  Recent Labs Lab 04/12/15 0750 04/12/15 1155 04/12/15 1652 04/12/15 2122 04/13/15 0813  GLUCAP 244* 299* 345* 321* 92     IMAGING STUDIES US Abdomen Complete  04/10/2015   CLINICAL DATA:  Transaminitis  EXAM: ULTRASOUND ABDOMEN COMPLETE  COMPARISON:  CT 12/03/2011  FINDINGS: Gallbladder: Small amount of sludge within the gallbladder. No wall thickening. No stones. Negative sonographic Murphy's.  Common bile duct: Diameter: Normal caliber, 4 mm  Liver: No focal lesion identified. Within normal limits in parenchymal echogenicity.  IVC: No abnormality visualized.  Pancreas: Visualized portion unremarkable.  Spleen: Size and appearance within normal limits.  Right Kidney: Length: 10.3 cm. Multiple cysts, the largest 3.4 cm. Echogenicity within normal limits. No mass or hydronephrosis visualized.  Left Kidney: Length: 10.8 cm. Multiple cysts, the largest 1.3 cm. Echogenicity within normal limits. No mass or hydronephrosis visualized.  Abdominal aorta: No aneurysm visualized.  Other findings: Incidentally noted is a small right pleural effusion.  IMPRESSION: Small amount of sludge in the gallbladder. No stones or evidence of acute cholecystitis.  Bilateral renal cysts.  Right pleural effusion.   Electronically Signed   By: Charlett Nose M.D.   On: 04/10/2015 09:36   Dg Chest Port 1 View  04/09/2015   CLINICAL DATA:  Weakness and dizziness.  Low blood pressure.  EXAM: PORTABLE CHEST - 1 VIEW  COMPARISON:  None.  FINDINGS: Stable enlarged cardiac silhouette. There are low lung volumes and  basilar atelectasis. No effusion, infiltrate, pneumothorax.  IMPRESSION: Cardiomegaly and low lung volumes.  No acute findings   Electronically Signed   By: Genevive Bi M.D.   On: 04/09/2015 17:16    DISCHARGE EXAMINATION: Filed Vitals:   04/12/15 1400 04/12/15 2123 04/13/15 0500 04/13/15 0516  BP: 137/82 149/69  141/81  Pulse: 76 64  57  Temp: 97.8 F (36.6 C) 97.9 F (36.6 C)  98 F (36.7 C)  TempSrc: Oral Oral  Oral  Resp: Height:      Weight:   109.861 kg (242 lb 3.2 oz)   SpO2: 98% 100%  99%   General appearance: alert, cooperative, appears stated age and no distress Resp: clear to auscultation bilaterally Cardio: regular rate and rhythm, S1, S2 normal, no murmur, click, rub or gallop GI: soft, non-tender; bowel sounds normal; no masses,  no organomegaly Extremities: extremities normal, atraumatic, no cyanosis or edema Neurologic: Alert and oriented X 3, normal strength and tone. Normal symmetric reflexes. Normal coordination and gait  DISPOSITION: Home  Discharge Instructions    Call MD for:  difficulty breathing, headache or visual disturbances    Complete by:  As directed      Call MD for:  extreme fatigue    Complete by:  As directed      Call MD for:  persistant dizziness or light-headedness    Complete by:  As directed      Call MD for:  persistant nausea and vomiting    Complete by:  As directed      Call MD for:  severe uncontrolled pain    Complete by:  As directed      Call MD for:  temperature >100.4    Complete by:  As directed      Discharge instructions    Complete by:  As directed   Please continue checking your glucose levels 4 times a day. Call your doctor if levels stay above 300 consistently. Reduce your daytime Lantus dose by 4 units if glucose levels are less than 90 at any time. Keep your appointments. Take your medications as prescribed. Continue diabetic soft diet for few more days.  You were cared for by a hospitalist during  your hospital stay. If you have any questions about your discharge medications or the care you received while you were in the hospital after you are discharged, you can call the unit and asked to speak with the hospitalist on call if the hospitalist that took care of you is not available. Once you are discharged, your primary care physician will handle any further medical issues. Please note that NO REFILLS for any discharge medications will be authorized once you are discharged, as it is imperative that you return to your primary care physician (or establish a relationship with a primary care physician if you do not have one) for your aftercare needs so that they can reassess your  need for medications and monitor your lab values. If you do not have a primary care physician, you can call [phone] for a physician referral.     Increase activity slowly    Complete by:  As directed            ALLERGIES:  Allergies  Allergen Reactions  . Codeine Nausea And Vomiting     Discharge Medication List as of 04/13/2015 11:22 AM    CONTINUE these medications which have CHANGED   Details  fluconazole (DIFLUCAN) 100 MG tablet Take 1 tablet (100 mg total) by mouth daily., Starting 04/13/2015, Until Discontinued, Print    insulin glargine (LANTUS) 100 UNIT/ML injection Take 30 Units in the morning and 10 units at Night., No Print    levothyroxine (SYNTHROID, LEVOTHROID) 150 MCG tablet Take 1 tablet (150 mcg total) by mouth daily before breakfast., Starting 04/13/2015, Until Discontinued, Print    predniSONE (DELTASONE) 20 MG tablet Take 2 tablets (40 mg total) by mouth daily with breakfast., Starting 04/13/2015, Until Discontinued, No Print      CONTINUE these medications which have NOT CHANGED   Details  aspirin 325 MG tablet Take 325 mg by mouth daily., Until Discontinued, Historical Med    carvedilol (COREG) 6.25 MG tablet Take 6.25 mg by mouth 2 (two) times daily with a meal., Until Discontinued,  Historical Med    insulin aspart (NOVOLOG) 100 UNIT/ML injection Before each meal 3 times a day, 120-150 - 2 units, 151-200 - 4 units, 201-250 - 8 units, 251-300 - 10 units,  301 or above 10 units and call your MD, Print    mercaptopurine (PURINETHOL) 50 MG tablet Take 50 mg by mouth daily. Give on an empty stomach 1 hour before or 2 hours after meals. Caution: Chemotherapy., Until Discontinued, Historical Med    nystatin (MYCOSTATIN) 100000 UNIT/ML suspension Take 5 mLs (500,000 Units total) by mouth 4 (four) times daily., Starting 04/03/2015, Until Discontinued, Normal    ondansetron (ZOFRAN-ODT) 4 MG disintegrating tablet Take 4 mg by mouth every 8 (eight) hours as needed for nausea or vomiting., Until Discontinued, Historical Med       Follow-up Information    Go to Astrid Divine, MD.   Specialty:  Family Medicine   Why:  as scheduled   Contact information:   [address] [place] [address] [phone]       Call Barrie Folk, MD.   Specialty:  Gastroenterology   Why:  as instructed   Contact information:   [address] [place] [address] [phone]       TOTAL DISCHARGE TIME: 35 mins  [facility]  [facility] Pager [phone]  04/13/2015, 3:21 PM

## 2015-04-13 NOTE — Progress Notes (Signed)
Subjective: Tolerating diet without nausea, vomiting. No abdominal pain.  Objective: Vital signs in last 24 hours: Temp:  [97.8 F (36.6 C)-98 F (36.7 C)] 98 F (36.7 C) (08/03 0516) Pulse Rate:  [57-76] 57 (08/03 0516) Resp:  [20-23] 20 (08/03 0516) BP: (137-149)/(69-82) 141/81 mmHg (08/03 0516) SpO2:  [98 %-100 %] 99 % (08/03 0516) Weight:  [109.861 kg (242 lb 3.2 oz)] 109.861 kg (242 lb 3.2 oz) (08/03 0500) Weight change: -2.994 kg (-6 lb 9.6 oz) Last BM Date: 04/08/15  PE: GEN:  Obese, NAD  Lab Results: CBC    Component Value Date/Time   WBC 4.4 04/13/2015 0640   RBC 4.64 04/13/2015 0640   HGB 13.1 04/13/2015 0640   HCT 37.4 04/13/2015 0640   PLT 131* 04/13/2015 0640   MCV 80.6 04/13/2015 0640   MCH 28.2 04/13/2015 0640   MCHC 35.0 04/13/2015 0640   RDW 19.2* 04/13/2015 0640   LYMPHSABS 2.3 04/09/2015 1654   MONOABS 0.7 04/09/2015 1654   EOSABS 0.0 04/09/2015 1654   BASOSABS 0.1 04/09/2015 1654   CMP     Component Value Date/Time   NA 145 04/13/2015 0640   K 3.7 04/13/2015 0640   CL 113* 04/13/2015 0640   CO2 23 04/13/2015 0640   GLUCOSE 67 04/13/2015 0640   BUN <5* 04/13/2015 0640   CREATININE 0.98 04/13/2015 0640   CALCIUM 8.2* 04/13/2015 0640   PROT 5.9* 04/13/2015 0640   ALBUMIN 2.3* 04/13/2015 0640   AST 574* 04/13/2015 0640   ALT 752* 04/13/2015 0640   ALKPHOS 181* 04/13/2015 0640   BILITOT 1.7* 04/13/2015 0640   GFRNONAA >60 04/13/2015 0640   GFRAA >60 04/13/2015 0640    Assessment:  1.  Elevated LFTs.  Improving, slowly downtrending.  Suspect chronic autoimmune hepatitis on top of acute shock liver.  Plan:  1.  Ok to discharge home today from GI perspective. 2.  Patient can follow-up with Dr. Dorena Cookey [facility] [phone]) upon discharge. 3.  Will sign-off; please call with questions; thank you for the consultation.    Freddy Jaksch 04/13/2015, 8:46 AM   Pager [phone] If no answer or after 5 PM call  [phone]

## 2015-04-13 NOTE — Progress Notes (Signed)
[name] to be D/C'd Home per MD order.  Discussed with the patient and all questions fully answered.    Medication List    TAKE these medications        aspirin 325 MG tablet  Take 325 mg by mouth daily.     carvedilol 6.25 MG tablet  Commonly known as:  COREG  Take 6.25 mg by mouth 2 (two) times daily with a meal.     fluconazole 100 MG tablet  Commonly known as:  DIFLUCAN  Take 1 tablet (100 mg total) by mouth daily.     insulin aspart 100 UNIT/ML injection  Commonly known as:  novoLOG  Before each meal 3 times a day, 120-150 - 2 units, 151-200 - 4 units, 201-250 - 8 units, 251-300 - 10 units,  301 or above 10 units and call your MD     insulin glargine 100 UNIT/ML injection  Commonly known as:  LANTUS  Take 30 Units in the morning and 10 units at Night.     levothyroxine 150 MCG tablet  Commonly known as:  SYNTHROID, LEVOTHROID  Take 1 tablet (150 mcg total) by mouth daily before breakfast.     mercaptopurine 50 MG tablet  Commonly known as:  PURINETHOL  Take 50 mg by mouth daily. Give on an empty stomach 1 hour before or 2 hours after meals. Caution: Chemotherapy.     nystatin 100000 UNIT/ML suspension  Commonly known as:  MYCOSTATIN  Take 5 mLs (500,000 Units total) by mouth 4 (four) times daily.     ondansetron 4 MG disintegrating tablet  Commonly known as:  ZOFRAN-ODT  Take 4 mg by mouth every 8 (eight) hours as needed for nausea or vomiting.     predniSONE 20 MG tablet  Commonly known as:  DELTASONE  Take 2 tablets (40 mg total) by mouth daily with breakfast.        VVS, Skin clean, dry and intact without evidence of skin break down, no evidence of skin tears noted. IV catheter discontinued intact. Site without signs and symptoms of complications. Dressing and pressure applied.  An After Visit Summary was printed and given to the patient. Patient escorted via WC, and D/C home via private auto.  Dana Thornton D 04/13/2015 11:54 AM

## 2015-04-13 NOTE — Discharge Instructions (Signed)

## 2015-08-11 ENCOUNTER — Other Ambulatory Visit: Payer: Self-pay

## 2015-08-11 DIAGNOSIS — Z1231 Encounter for screening mammogram for malignant neoplasm of breast: Secondary | ICD-10-CM

## 2015-09-15 ENCOUNTER — Other Ambulatory Visit: Payer: Self-pay | Admitting: Family Medicine

## 2015-09-15 ENCOUNTER — Ambulatory Visit
Admission: RE | Admit: 2015-09-15 | Discharge: 2015-09-15 | Disposition: A | Discharge status: Home / Self Care | Payer: 59 | Source: Ambulatory Visit

## 2015-09-15 DIAGNOSIS — Z1231 Encounter for screening mammogram for malignant neoplasm of breast: Secondary | ICD-10-CM

## 2015-09-15 DIAGNOSIS — R928 Other abnormal and inconclusive findings on diagnostic imaging of breast: Secondary | ICD-10-CM

## 2015-09-23 ENCOUNTER — Ambulatory Visit
Admission: RE | Admit: 2015-09-23 | Discharge: 2015-09-23 | Disposition: A | Discharge status: Home / Self Care | Payer: 59 | Source: Ambulatory Visit | Attending: Family Medicine | Admitting: Family Medicine

## 2015-09-23 DIAGNOSIS — R928 Other abnormal and inconclusive findings on diagnostic imaging of breast: Secondary | ICD-10-CM

## 2016-09-04 ENCOUNTER — Other Ambulatory Visit: Payer: Self-pay | Admitting: Family Medicine

## 2016-09-04 DIAGNOSIS — Z1231 Encounter for screening mammogram for malignant neoplasm of breast: Secondary | ICD-10-CM

## 2016-09-18 ENCOUNTER — Ambulatory Visit
Admission: RE | Admit: 2016-09-18 | Discharge: 2016-09-18 | Disposition: A | Discharge status: Home / Self Care | Payer: 59 | Source: Ambulatory Visit | Attending: Family Medicine | Admitting: Family Medicine

## 2016-09-18 DIAGNOSIS — Z1231 Encounter for screening mammogram for malignant neoplasm of breast: Secondary | ICD-10-CM

## 2017-06-26 ENCOUNTER — Emergency Department (HOSPITAL_COMMUNITY): Payer: 59

## 2017-06-26 ENCOUNTER — Encounter (HOSPITAL_COMMUNITY): Payer: Self-pay

## 2017-06-26 DIAGNOSIS — A419 Sepsis, unspecified organism: Secondary | ICD-10-CM | POA: Diagnosis not present

## 2017-06-26 DIAGNOSIS — E870 Hyperosmolality and hypernatremia: Secondary | ICD-10-CM | POA: Diagnosis not present

## 2017-06-26 DIAGNOSIS — Z8249 Family history of ischemic heart disease and other diseases of the circulatory system: Secondary | ICD-10-CM

## 2017-06-26 DIAGNOSIS — Z794 Long term (current) use of insulin: Secondary | ICD-10-CM

## 2017-06-26 DIAGNOSIS — E7800 Pure hypercholesterolemia, unspecified: Secondary | ICD-10-CM | POA: Diagnosis present

## 2017-06-26 DIAGNOSIS — B370 Candidal stomatitis: Secondary | ICD-10-CM | POA: Diagnosis not present

## 2017-06-26 DIAGNOSIS — K5730 Diverticulosis of large intestine without perforation or abscess without bleeding: Secondary | ICD-10-CM | POA: Diagnosis present

## 2017-06-26 DIAGNOSIS — D696 Thrombocytopenia, unspecified: Secondary | ICD-10-CM | POA: Diagnosis present

## 2017-06-26 DIAGNOSIS — E875 Hyperkalemia: Secondary | ICD-10-CM | POA: Diagnosis present

## 2017-06-26 DIAGNOSIS — Z885 Allergy status to narcotic agent status: Secondary | ICD-10-CM

## 2017-06-26 DIAGNOSIS — E669 Obesity, unspecified: Secondary | ICD-10-CM | POA: Diagnosis present

## 2017-06-26 DIAGNOSIS — E8770 Fluid overload, unspecified: Secondary | ICD-10-CM | POA: Diagnosis not present

## 2017-06-26 DIAGNOSIS — E872 Acidosis: Secondary | ICD-10-CM | POA: Diagnosis present

## 2017-06-26 DIAGNOSIS — E876 Hypokalemia: Secondary | ICD-10-CM | POA: Diagnosis not present

## 2017-06-26 DIAGNOSIS — E871 Hypo-osmolality and hyponatremia: Secondary | ICD-10-CM | POA: Diagnosis present

## 2017-06-26 DIAGNOSIS — D899 Disorder involving the immune mechanism, unspecified: Secondary | ICD-10-CM | POA: Diagnosis present

## 2017-06-26 DIAGNOSIS — N10 Acute tubulo-interstitial nephritis: Secondary | ICD-10-CM | POA: Diagnosis present

## 2017-06-26 DIAGNOSIS — R6521 Severe sepsis with septic shock: Secondary | ICD-10-CM | POA: Diagnosis present

## 2017-06-26 DIAGNOSIS — N183 Chronic kidney disease, stage 3 (moderate): Secondary | ICD-10-CM | POA: Diagnosis present

## 2017-06-26 DIAGNOSIS — K8020 Calculus of gallbladder without cholecystitis without obstruction: Secondary | ICD-10-CM | POA: Diagnosis present

## 2017-06-26 DIAGNOSIS — B9620 Unspecified Escherichia coli [E. coli] as the cause of diseases classified elsewhere: Secondary | ICD-10-CM | POA: Diagnosis present

## 2017-06-26 DIAGNOSIS — I129 Hypertensive chronic kidney disease with stage 1 through stage 4 chronic kidney disease, or unspecified chronic kidney disease: Secondary | ICD-10-CM | POA: Diagnosis present

## 2017-06-26 DIAGNOSIS — Z87442 Personal history of urinary calculi: Secondary | ICD-10-CM

## 2017-06-26 DIAGNOSIS — J9691 Respiratory failure, unspecified with hypoxia: Secondary | ICD-10-CM | POA: Diagnosis not present

## 2017-06-26 DIAGNOSIS — Z7952 Long term (current) use of systemic steroids: Secondary | ICD-10-CM

## 2017-06-26 DIAGNOSIS — Z841 Family history of disorders of kidney and ureter: Secondary | ICD-10-CM

## 2017-06-26 DIAGNOSIS — E1122 Type 2 diabetes mellitus with diabetic chronic kidney disease: Secondary | ICD-10-CM | POA: Diagnosis present

## 2017-06-26 DIAGNOSIS — E860 Dehydration: Secondary | ICD-10-CM | POA: Diagnosis not present

## 2017-06-26 DIAGNOSIS — N170 Acute kidney failure with tubular necrosis: Secondary | ICD-10-CM | POA: Diagnosis present

## 2017-06-26 DIAGNOSIS — N200 Calculus of kidney: Secondary | ICD-10-CM | POA: Diagnosis present

## 2017-06-26 DIAGNOSIS — K754 Autoimmune hepatitis: Secondary | ICD-10-CM | POA: Diagnosis present

## 2017-06-26 DIAGNOSIS — Z6841 Body Mass Index (BMI) 40.0 and over, adult: Secondary | ICD-10-CM

## 2017-06-26 DIAGNOSIS — Z7982 Long term (current) use of aspirin: Secondary | ICD-10-CM

## 2017-06-26 DIAGNOSIS — G9340 Encephalopathy, unspecified: Secondary | ICD-10-CM | POA: Diagnosis present

## 2017-06-26 DIAGNOSIS — E039 Hypothyroidism, unspecified: Secondary | ICD-10-CM | POA: Diagnosis present

## 2017-06-26 DIAGNOSIS — I700 Atherosclerosis of aorta: Secondary | ICD-10-CM | POA: Diagnosis present

## 2017-06-26 LAB — DIFFERENTIAL
BASOS ABS: 0 10*3/uL (ref 0.0–0.1)
Basophils Relative: 0 %
EOS ABS: 0.1 10*3/uL (ref 0.0–0.7)
EOS PCT: 1 %
Lymphocytes Relative: 6 %
Lymphs Abs: 0.8 10*3/uL (ref 0.7–4.0)
MONO ABS: 0.4 10*3/uL (ref 0.1–1.0)
Monocytes Relative: 3 %
NEUTROS ABS: 12.6 10*3/uL — AB (ref 1.7–7.7)
Neutrophils Relative %: 90 %

## 2017-06-26 LAB — COMPREHENSIVE METABOLIC PANEL
ALT: 49 U/L (ref 14–54)
AST: 44 U/L — AB (ref 15–41)
Albumin: 2 g/dL — ABNORMAL LOW (ref 3.5–5.0)
Alkaline Phosphatase: 279 U/L — ABNORMAL HIGH (ref 38–126)
Anion gap: 12 (ref 5–15)
BILIRUBIN TOTAL: 3.6 mg/dL — AB (ref 0.3–1.2)
BUN: 48 mg/dL — AB (ref 6–20)
CHLORIDE: 97 mmol/L — AB (ref 101–111)
CO2: 21 mmol/L — AB (ref 22–32)
Calcium: 8.1 mg/dL — ABNORMAL LOW (ref 8.9–10.3)
Creatinine, Ser: 2.65 mg/dL — ABNORMAL HIGH (ref 0.44–1.00)
GFR, EST AFRICAN AMERICAN: 21 mL/min — AB (ref >60)
GFR, EST NON AFRICAN AMERICAN: 18 mL/min — AB (ref >60)
GLUCOSE: 364 mg/dL — AB (ref 65–99)
POTASSIUM: 3.8 mmol/L (ref 3.5–5.1)
Sodium: 130 mmol/L — ABNORMAL LOW (ref 135–145)
Total Protein: 8.1 g/dL (ref 6.5–8.1)

## 2017-06-26 LAB — I-STAT CHEM 8, ED
BUN: 45 mg/dL — AB (ref 6–20)
CALCIUM ION: 1.08 mmol/L — AB (ref 1.15–1.40)
CHLORIDE: 97 mmol/L — AB (ref 101–111)
Creatinine, Ser: 2.6 mg/dL — ABNORMAL HIGH (ref 0.44–1.00)
Glucose, Bld: 364 mg/dL — ABNORMAL HIGH (ref 65–99)
HEMATOCRIT: 38 % (ref 36.0–46.0)
Hemoglobin: 12.9 g/dL (ref 12.0–15.0)
Potassium: 4 mmol/L (ref 3.5–5.1)
SODIUM: 135 mmol/L (ref 135–145)
TCO2: 23 mmol/L (ref 22–32)

## 2017-06-26 LAB — I-STAT TROPONIN, ED: Troponin i, poc: 0 ng/mL (ref 0.00–0.08)

## 2017-06-26 LAB — CBC
HEMATOCRIT: 34.4 % — AB (ref 36.0–46.0)
HEMOGLOBIN: 12.2 g/dL (ref 12.0–15.0)
MCH: 30.5 pg (ref 26.0–34.0)
MCHC: 35.5 g/dL (ref 30.0–36.0)
MCV: 86 fL (ref 78.0–100.0)
Platelets: 55 10*3/uL — ABNORMAL LOW (ref 150–400)
RBC: 4 MIL/uL (ref 3.87–5.11)
RDW: 16.9 % — AB (ref 11.5–15.5)
WBC: 13.9 10*3/uL — AB (ref 4.0–10.5)

## 2017-06-26 LAB — PROTIME-INR
INR: 1.67
Prothrombin Time: 19.5 seconds — ABNORMAL HIGH (ref 11.4–15.2)

## 2017-06-26 LAB — APTT: APTT: 39 s — AB (ref 24–36)

## 2017-06-26 LAB — CBG MONITORING, ED: GLUCOSE-CAPILLARY: 355 mg/dL — AB (ref 65–99)

## 2017-06-26 NOTE — ED Triage Notes (Signed)
Pt arrives with spouse from home; pt has evident slurring of speech at triage; spouse states pt normally does not speak with slurr and he had had to help pt ambulate today; Pt received flu shot a week prior to sx starting;pt has good grips on bilateral sides with no drift;-Monique,RN

## 2017-06-27 ENCOUNTER — Inpatient Hospital Stay (HOSPITAL_COMMUNITY): Payer: 59

## 2017-06-27 ENCOUNTER — Emergency Department (HOSPITAL_COMMUNITY): Payer: 59

## 2017-06-27 ENCOUNTER — Inpatient Hospital Stay (HOSPITAL_COMMUNITY)
Admission: EM | Admit: 2017-06-27 | Discharge: 2017-07-03 | DRG: 871 | Disposition: A | Discharge status: Home Health | Payer: 59 | Attending: Family Medicine | Admitting: Family Medicine

## 2017-06-27 DIAGNOSIS — D899 Disorder involving the immune mechanism, unspecified: Secondary | ICD-10-CM | POA: Diagnosis present

## 2017-06-27 DIAGNOSIS — E860 Dehydration: Secondary | ICD-10-CM | POA: Diagnosis present

## 2017-06-27 DIAGNOSIS — E875 Hyperkalemia: Secondary | ICD-10-CM

## 2017-06-27 DIAGNOSIS — Z885 Allergy status to narcotic agent status: Secondary | ICD-10-CM | POA: Diagnosis not present

## 2017-06-27 DIAGNOSIS — E7800 Pure hypercholesterolemia, unspecified: Secondary | ICD-10-CM | POA: Diagnosis present

## 2017-06-27 DIAGNOSIS — E870 Hyperosmolality and hypernatremia: Secondary | ICD-10-CM | POA: Diagnosis not present

## 2017-06-27 DIAGNOSIS — Z794 Long term (current) use of insulin: Secondary | ICD-10-CM | POA: Diagnosis not present

## 2017-06-27 DIAGNOSIS — Z841 Family history of disorders of kidney and ureter: Secondary | ICD-10-CM | POA: Diagnosis not present

## 2017-06-27 DIAGNOSIS — R6521 Severe sepsis with septic shock: Secondary | ICD-10-CM

## 2017-06-27 DIAGNOSIS — N179 Acute kidney failure, unspecified: Secondary | ICD-10-CM

## 2017-06-27 DIAGNOSIS — Z8249 Family history of ischemic heart disease and other diseases of the circulatory system: Secondary | ICD-10-CM | POA: Diagnosis not present

## 2017-06-27 DIAGNOSIS — Z87442 Personal history of urinary calculi: Secondary | ICD-10-CM | POA: Diagnosis not present

## 2017-06-27 DIAGNOSIS — N170 Acute kidney failure with tubular necrosis: Secondary | ICD-10-CM | POA: Diagnosis present

## 2017-06-27 DIAGNOSIS — N183 Chronic kidney disease, stage 3 (moderate): Secondary | ICD-10-CM | POA: Diagnosis present

## 2017-06-27 DIAGNOSIS — N12 Tubulo-interstitial nephritis, not specified as acute or chronic: Secondary | ICD-10-CM

## 2017-06-27 DIAGNOSIS — G9340 Encephalopathy, unspecified: Secondary | ICD-10-CM | POA: Diagnosis present

## 2017-06-27 DIAGNOSIS — E871 Hypo-osmolality and hyponatremia: Secondary | ICD-10-CM | POA: Diagnosis present

## 2017-06-27 DIAGNOSIS — A419 Sepsis, unspecified organism: Secondary | ICD-10-CM | POA: Diagnosis present

## 2017-06-27 DIAGNOSIS — R0603 Acute respiratory distress: Secondary | ICD-10-CM | POA: Diagnosis not present

## 2017-06-27 DIAGNOSIS — B370 Candidal stomatitis: Secondary | ICD-10-CM | POA: Diagnosis not present

## 2017-06-27 DIAGNOSIS — K754 Autoimmune hepatitis: Secondary | ICD-10-CM | POA: Diagnosis present

## 2017-06-27 DIAGNOSIS — N10 Acute tubulo-interstitial nephritis: Secondary | ICD-10-CM | POA: Diagnosis present

## 2017-06-27 DIAGNOSIS — I129 Hypertensive chronic kidney disease with stage 1 through stage 4 chronic kidney disease, or unspecified chronic kidney disease: Secondary | ICD-10-CM | POA: Diagnosis present

## 2017-06-27 DIAGNOSIS — R0600 Dyspnea, unspecified: Secondary | ICD-10-CM

## 2017-06-27 DIAGNOSIS — Z7952 Long term (current) use of systemic steroids: Secondary | ICD-10-CM | POA: Diagnosis not present

## 2017-06-27 DIAGNOSIS — Z6841 Body Mass Index (BMI) 40.0 and over, adult: Secondary | ICD-10-CM | POA: Diagnosis not present

## 2017-06-27 DIAGNOSIS — J9691 Respiratory failure, unspecified with hypoxia: Secondary | ICD-10-CM | POA: Diagnosis not present

## 2017-06-27 DIAGNOSIS — Z7982 Long term (current) use of aspirin: Secondary | ICD-10-CM | POA: Diagnosis not present

## 2017-06-27 DIAGNOSIS — E872 Acidosis: Secondary | ICD-10-CM | POA: Diagnosis present

## 2017-06-27 LAB — GLUCOSE, CAPILLARY
GLUCOSE-CAPILLARY: 104 mg/dL — AB (ref 65–99)
GLUCOSE-CAPILLARY: 296 mg/dL — AB (ref 65–99)
Glucose-Capillary: 226 mg/dL — ABNORMAL HIGH (ref 65–99)
Glucose-Capillary: 304 mg/dL — ABNORMAL HIGH (ref 65–99)

## 2017-06-27 LAB — ECHOCARDIOGRAM COMPLETE
Ao-asc: 35 cm
CHL CUP TV REG PEAK VELOCITY: 299 cm/s
E decel time: 180 msec
E/e' ratio: 13.78
FS: 34 % (ref 28–44)
Height: 62 in
IVS/LV PW RATIO, ED: 1.17
LA ID, A-P, ES: 34 mm
LA diam end sys: 34 mm
LADIAMINDEX: 1.52 cm/m2
LAVOL: 48.8 mL
LAVOLA4C: 47.9 mL
LAVOLIN: 21.8 mL/m2
LV E/e' medial: 13.78
LV TDI E'MEDIAL: 8.16
LVEEAVG: 13.78
LVELAT: 8.49 cm/s
LVOT area: 2.84 cm2
LVOT diameter: 19 mm
MV Dec: 180
MVPG: 5 mmHg
MVPKAVEL: 99.9 m/s
MVPKEVEL: 117 m/s
P 1/2 time: 483 ms
PW: 8.54 mm — AB (ref 0.6–1.1)
RV LATERAL S' VELOCITY: 15 cm/s
RV TAPSE: 23.8 mm
TDI e' lateral: 8.49
TRMAXVEL: 299 cm/s
Weight: 3813.08 oz

## 2017-06-27 LAB — BLOOD CULTURE ID PANEL (REFLEXED)
ACINETOBACTER BAUMANNII: NOT DETECTED
CANDIDA ALBICANS: NOT DETECTED
CARBAPENEM RESISTANCE: NOT DETECTED
Candida glabrata: NOT DETECTED
Candida krusei: NOT DETECTED
Candida parapsilosis: NOT DETECTED
Candida tropicalis: NOT DETECTED
ENTEROBACTER CLOACAE COMPLEX: NOT DETECTED
ENTEROBACTERIACEAE SPECIES: DETECTED — AB
ENTEROCOCCUS SPECIES: NOT DETECTED
Escherichia coli: DETECTED — AB
HAEMOPHILUS INFLUENZAE: NOT DETECTED
KLEBSIELLA PNEUMONIAE: NOT DETECTED
Klebsiella oxytoca: NOT DETECTED
LISTERIA MONOCYTOGENES: NOT DETECTED
NEISSERIA MENINGITIDIS: NOT DETECTED
PSEUDOMONAS AERUGINOSA: NOT DETECTED
Proteus species: NOT DETECTED
STAPHYLOCOCCUS AUREUS BCID: NOT DETECTED
STREPTOCOCCUS AGALACTIAE: NOT DETECTED
STREPTOCOCCUS PNEUMONIAE: NOT DETECTED
STREPTOCOCCUS PYOGENES: NOT DETECTED
STREPTOCOCCUS SPECIES: NOT DETECTED
Serratia marcescens: NOT DETECTED
Staphylococcus species: NOT DETECTED

## 2017-06-27 LAB — CBG MONITORING, ED: Glucose-Capillary: 271 mg/dL — ABNORMAL HIGH (ref 65–99)

## 2017-06-27 LAB — CK: CK TOTAL: 16 U/L — AB (ref 38–234)

## 2017-06-27 LAB — URINALYSIS, ROUTINE W REFLEX MICROSCOPIC
Bilirubin Urine: NEGATIVE
Glucose, UA: NEGATIVE mg/dL
Ketones, ur: NEGATIVE mg/dL
Nitrite: NEGATIVE
Protein, ur: 100 mg/dL — AB
SPECIFIC GRAVITY, URINE: 1.018 (ref 1.005–1.030)
pH: 5 (ref 5.0–8.0)

## 2017-06-27 LAB — TROPONIN I
Troponin I: 0.04 ng/mL (ref <0.03)
Troponin I: 0.05 ng/mL (ref <0.03)
Troponin I: 0.07 ng/mL (ref <0.03)

## 2017-06-27 LAB — I-STAT ARTERIAL BLOOD GAS, ED
ACID-BASE DEFICIT: 8 mmol/L — AB (ref 0.0–2.0)
BICARBONATE: 16.9 mmol/L — AB (ref 20.0–28.0)
O2 Saturation: 90 %
PH ART: 7.335 — AB (ref 7.350–7.450)
TCO2: 18 mmol/L — ABNORMAL LOW (ref 22–32)
pCO2 arterial: 31.6 mmHg — ABNORMAL LOW (ref 32.0–48.0)
pO2, Arterial: 60 mmHg — ABNORMAL LOW (ref 83.0–108.0)

## 2017-06-27 LAB — BASIC METABOLIC PANEL
ANION GAP: 14 (ref 5–15)
BUN: 48 mg/dL — ABNORMAL HIGH (ref 6–20)
CALCIUM: 7 mg/dL — AB (ref 8.9–10.3)
CO2: 13 mmol/L — AB (ref 22–32)
CREATININE: 3.15 mg/dL — AB (ref 0.44–1.00)
Chloride: 103 mmol/L (ref 101–111)
GFR calc Af Amer: 17 mL/min — ABNORMAL LOW (ref >60)
GFR, EST NON AFRICAN AMERICAN: 15 mL/min — AB (ref >60)
GLUCOSE: 271 mg/dL — AB (ref 65–99)
Potassium: 4.3 mmol/L (ref 3.5–5.1)
Sodium: 130 mmol/L — ABNORMAL LOW (ref 135–145)

## 2017-06-27 LAB — HEMOGLOBIN A1C
Hgb A1c MFr Bld: 13.1 % — ABNORMAL HIGH (ref 4.8–5.6)
Mean Plasma Glucose: 329.27 mg/dL

## 2017-06-27 LAB — I-STAT CG4 LACTIC ACID, ED
LACTIC ACID, VENOUS: 5.22 mmol/L — AB (ref 0.5–1.9)
Lactic Acid, Venous: 6.02 mmol/L (ref 0.5–1.9)

## 2017-06-27 LAB — MRSA PCR SCREENING: MRSA BY PCR: NEGATIVE

## 2017-06-27 LAB — AMMONIA: AMMONIA: 37 umol/L — AB (ref 9–35)

## 2017-06-27 LAB — LACTIC ACID, PLASMA: Lactic Acid, Venous: 4.7 mmol/L (ref 0.5–1.9)

## 2017-06-27 LAB — BRAIN NATRIURETIC PEPTIDE: B NATRIURETIC PEPTIDE 5: 82.4 pg/mL (ref 0.0–100.0)

## 2017-06-27 MED ORDER — SODIUM CHLORIDE 0.9 % IV BOLUS (SEPSIS)
1000.0000 mL | Freq: Once | INTRAVENOUS | Status: AC
  Administered 2017-06-27: 1000 mL via INTRAVENOUS

## 2017-06-27 MED ORDER — INSULIN ASPART 100 UNIT/ML ~~LOC~~ SOLN
0.0000 [IU] | SUBCUTANEOUS | Status: DC
  Administered 2017-06-27: 7 [IU] via SUBCUTANEOUS
  Administered 2017-06-27: 15 [IU] via SUBCUTANEOUS
  Administered 2017-06-27: 11 [IU] via SUBCUTANEOUS
  Administered 2017-06-28: 7 [IU] via SUBCUTANEOUS
  Administered 2017-06-28: 4 [IU] via SUBCUTANEOUS
  Administered 2017-06-28: 3 [IU] via SUBCUTANEOUS
  Administered 2017-06-29 (×2): 7 [IU] via SUBCUTANEOUS
  Administered 2017-06-29 (×3): 4 [IU] via SUBCUTANEOUS
  Administered 2017-06-29: 11 [IU] via SUBCUTANEOUS
  Administered 2017-06-30: 4 [IU] via SUBCUTANEOUS
  Administered 2017-06-30: 3 [IU] via SUBCUTANEOUS
  Administered 2017-06-30: 7 [IU] via SUBCUTANEOUS
  Administered 2017-06-30 (×2): 4 [IU] via SUBCUTANEOUS
  Administered 2017-07-01: 3 [IU] via SUBCUTANEOUS
  Administered 2017-07-01: 4 [IU] via SUBCUTANEOUS
  Administered 2017-07-01 (×2): 7 [IU] via SUBCUTANEOUS
  Administered 2017-07-01: 3 [IU] via SUBCUTANEOUS

## 2017-06-27 MED ORDER — PIPERACILLIN-TAZOBACTAM 3.375 G IVPB
3.3750 g | Freq: Three times a day (TID) | INTRAVENOUS | Status: DC
  Administered 2017-06-27 – 2017-06-28 (×3): 3.375 g via INTRAVENOUS
  Filled 2017-06-27 (×4): qty 50

## 2017-06-27 MED ORDER — VANCOMYCIN HCL IN DEXTROSE 1-5 GM/200ML-% IV SOLN
1000.0000 mg | Freq: Once | INTRAVENOUS | Status: AC
  Administered 2017-06-27: 1000 mg via INTRAVENOUS
  Filled 2017-06-27: qty 200

## 2017-06-27 MED ORDER — HEPARIN SODIUM (PORCINE) 5000 UNIT/ML IJ SOLN
5000.0000 [IU] | Freq: Three times a day (TID) | INTRAMUSCULAR | Status: DC
  Administered 2017-06-27 – 2017-07-01 (×12): 5000 [IU] via SUBCUTANEOUS
  Filled 2017-06-27 (×17): qty 1

## 2017-06-27 MED ORDER — WHITE PETROLATUM EX OINT
TOPICAL_OINTMENT | CUTANEOUS | Status: AC
  Administered 2017-06-27: 13:00:00
  Filled 2017-06-27: qty 28.35

## 2017-06-27 MED ORDER — SODIUM CHLORIDE 0.9 % IV SOLN
INTRAVENOUS | Status: DC
  Administered 2017-06-27 – 2017-06-29 (×4): via INTRAVENOUS

## 2017-06-27 MED ORDER — POTASSIUM CHLORIDE 10 MEQ/100ML IV SOLN
INTRAVENOUS | Status: AC
  Filled 2017-06-27: qty 100

## 2017-06-27 MED ORDER — LEVOTHYROXINE SODIUM 75 MCG PO TABS
150.0000 ug | ORAL_TABLET | Freq: Every day | ORAL | Status: DC
  Administered 2017-06-28 – 2017-07-03 (×6): 150 ug via ORAL
  Filled 2017-06-27 (×4): qty 1
  Filled 2017-06-27 (×2): qty 2

## 2017-06-27 MED ORDER — FENTANYL CITRATE (PF) 100 MCG/2ML IJ SOLN: 25.0000 ug | INTRAMUSCULAR | Status: DC | PRN

## 2017-06-27 MED ORDER — INSULIN GLARGINE 100 UNIT/ML ~~LOC~~ SOLN
30.0000 [IU] | Freq: Every day | SUBCUTANEOUS | Status: DC
  Administered 2017-06-27 – 2017-07-02 (×6): 30 [IU] via SUBCUTANEOUS
  Filled 2017-06-27 (×7): qty 0.3

## 2017-06-27 MED ORDER — SODIUM CHLORIDE 0.9 % IV BOLUS (SEPSIS)
500.0000 mL | Freq: Once | INTRAVENOUS | Status: AC
  Administered 2017-06-27: 500 mL via INTRAVENOUS

## 2017-06-27 MED ORDER — PREDNISONE 20 MG PO TABS
10.0000 mg | ORAL_TABLET | Freq: Every day | ORAL | Status: DC
  Administered 2017-06-28: 10 mg via ORAL
  Filled 2017-06-27: qty 0.5

## 2017-06-27 MED ORDER — VANCOMYCIN HCL IN DEXTROSE 1-5 GM/200ML-% IV SOLN
1000.0000 mg | INTRAVENOUS | Status: DC
  Filled 2017-06-27: qty 200

## 2017-06-27 MED ORDER — ORAL CARE MOUTH RINSE
15.0000 mL | Freq: Two times a day (BID) | OROMUCOSAL | Status: DC
  Administered 2017-06-27 – 2017-07-01 (×8): 15 mL via OROMUCOSAL

## 2017-06-27 MED ORDER — PIPERACILLIN-TAZOBACTAM 3.375 G IVPB 30 MIN
3.3750 g | Freq: Once | INTRAVENOUS | Status: AC
  Administered 2017-06-27: 3.375 g via INTRAVENOUS
  Filled 2017-06-27: qty 50

## 2017-06-27 NOTE — Progress Notes (Signed)
  Echocardiogram 2D Echocardiogram has been performed.  Dana Thornton 06/27/2017, 4:27 PM

## 2017-06-27 NOTE — H&P (Signed)
Name: Dana Thornton MRN: [id number] DOB: 1956/06/01    ADMISSION DATE:  06/27/2017 CONSULTATION DATE:  10/18   REFERRING MD :  EDP  CHIEF COMPLAINT:  UTI   BRIEF PATIENT DESCRIPTION: 61yo female with hx autoimmune hepatitis, HTN, DM presented 10/17 with weakness, difficulty speaking, SOB.   Initial triage concern was for CVA but in ER found to be hypotensive, tachycardic without any focal deficits.  Initial lactate >6, u/a showed UTI, CT abd concerning for pyelo, AKI with Scr 2.65.   BP improved but lactate did not clear while in ER so PCCM consulted for ?ICU needs.   SIGNIFICANT EVENTS    STUDIES:  CT head 10/17>>> neg acute  CT abd/pelvis 10/18>>>1. Mild right kidney perinephric stranding may represent underlying infection or inflammation of the renal collecting system. No hydronephrosis. 2. Right kidney interpolar punctate nonobstructive nephrolithiasis. 3. Cholelithiasis. 4. Aortic atherosclerosis. 5. Sigmoid diverticulosis.   HISTORY OF PRESENT ILLNESS:  61yo female with hx autoimmune hepatitis, HTN, DM presented 10/17 with weakness, difficulty speaking, SOB.   Initial triage concern was for CVA but in ER found to be hypotensive, tachycardic without any focal deficits.  Initial lactate >6, u/a showed UTI, CT abd concerning for pyelo.   BP improved but lactate did not clear while in ER so PCCM consulted for ?ICU needs.  Currently feeling a bit better although still c/o lower back pain, dyspnea, general malaise. Denies chest pain, headache, hemoptysis, cough, fever, neck pain/stiffness, n/v/d, myalgias.   Recently dropped to 10mg /day of prednisone for autoimmune hepatitis.  Had her flu shot about 1 week prior to onset symptoms.    PAST MEDICAL HISTORY :   has a past medical history of Hypercholesterolemia; Hypertension; Kidney stones; and Type II diabetes mellitus (HCC).  has a past surgical history that includes Dilation and curettage of uterus; Cesarean section (1999); and  Tubal ligation (1999). Prior to Admission medications   Medication Sig Start Date End Date Taking? Authorizing Provider  aspirin 325 MG tablet Take 325 mg by mouth daily.   Yes [provider]  carvedilol (COREG) 6.25 MG tablet Take 6.25 mg by mouth 2 (two) times daily with a meal.   Yes [provider]  cyclobenzaprine (FLEXERIL) 10 MG tablet Take 10 mg by mouth 3 (three) times daily as needed for muscle spasms.   Yes [provider]  insulin aspart (NOVOLOG) 100 UNIT/ML injection Before each meal 3 times a day, 120-150 - 2 units, 151-200 - 4 units, 201-250 - 8 units, 251-300 - 10 units,  301 or above 10 units and call your MD 12/10/11  Yes Leroy Sea, MD  insulin glargine (LANTUS) 100 UNIT/ML injection Take 30 Units in the morning and 10 units at Night. Patient taking differently: Inject 10-30 Units into the skin See admin instructions. Take 30 Units in the morning and 10 units at Night. 04/13/15  Yes Osvaldo Shipper, MD  levothyroxine (SYNTHROID, LEVOTHROID) 150 MCG tablet Take 1 tablet (150 mcg total) by mouth daily before breakfast. 04/13/15  Yes Osvaldo Shipper, MD  naproxen (NAPROSYN) 500 MG tablet Take 500 mg by mouth 2 (two) times daily with a meal.   Yes [provider]  fluconazole (DIFLUCAN) 100 MG tablet Take 1 tablet (100 mg total) by mouth daily. Patient not taking: Reported on 06/27/2017 04/13/15   Osvaldo Shipper, MD  nystatin (MYCOSTATIN) 100000 UNIT/ML suspension Take 5 mLs (500,000 Units total) by mouth 4 (four) times daily. Patient not taking: Reported on 06/27/2017 04/03/15  Dorothea Ogle, MD  predniSONE (DELTASONE) 20 MG tablet Take 2 tablets (40 mg total) by mouth daily with breakfast. Patient not taking: Reported on 06/27/2017 04/13/15   Osvaldo Shipper, MD   Allergies  Allergen Reactions  . Codeine Nausea And Vomiting    FAMILY HISTORY:  family history includes Hypertension in her father; Kidney failure in her father. SOCIAL  HISTORY:  reports that she has never smoked. She has never used smokeless tobacco. She reports that she does not drink alcohol or use drugs.  REVIEW OF SYSTEMS:   As per HPI - All other systems reviewed and were neg.    SUBJECTIVE:   VITAL SIGNS: Temp:  [97.9 F (36.6 C)] 97.9 F (36.6 C) (10/17 2146) Pulse Rate:  [112-125] 116 (10/18 0930) Resp:  [18-48] 38 (10/18 0930) BP: (83-184)/(56-163) 119/79 (10/18 0930) SpO2:  [82 %-97 %] 95 % (10/18 0930)  PHYSICAL EXAMINATION: General:  Pleasant female, NAD but uncomfortable appearing  Neuro:  Awake, alert, "slurred speech" appears to be more r/t extremely dry mouth.  She is awake and alert, appropriate. MAE equally, no drift.  HEENT:  Mm dry, no JVD  Cardiovascular:  s1s2 rrr, mild tachy  Lungs:  resps even, non labored, mild tachypnea on [place], very diminished bases Abdomen:  Round, soft, +bs, +CVA tenderness R>L Musculoskeletal:  Warm and dry, no edema    Recent Labs Lab 06/26/17 2156 06/26/17 2215  NA 130* 135  K 3.8 4.0  CL 97* 97*  CO2 21*  --   BUN 48* 45*  CREATININE 2.65* 2.60*  GLUCOSE 364* 364*    Recent Labs Lab 06/26/17 2156 06/26/17 2215  HGB 12.2 12.9  HCT 34.4* 38.0  WBC 13.9*  --   PLT 55*  --    Ct Abdomen Pelvis Wo Contrast  Result Date: 06/27/2017 CLINICAL DATA:  61 y/o  F; abdominal pain. EXAM: CT ABDOMEN AND PELVIS WITHOUT CONTRAST TECHNIQUE: Multidetector CT imaging of the abdomen and pelvis was performed following the standard protocol without IV contrast. COMPARISON:  12/03/2011 CT of abdomen and pelvis. FINDINGS: Lower chest: No acute abnormality. Hepatobiliary: No focal liver lesion. Cholelithiasis. No intra or extrahepatic biliary ductal dilatation. Pancreas: Unremarkable. No pancreatic ductal dilatation or surrounding inflammatory changes. Spleen: Normal in size without focal abnormality. Adrenals/Urinary Tract: Normal adrenal glands. Multiple lucent foci are present within the right kidney  with fluid attenuation, well-circumscribed, compatible with cysts. The largest cyst is in the interpolar region measuring 4 cm. Similar subcentimeter focus is present in left interpolar kidney. No hydronephrosis. Punctate densities in right interpolar kidney (series 6, image 81) are compatible with nephrolithiasis. Normal bladder. Mild right kidney perinephric stranding. Stomach/Bowel: Stomach is within normal limits. Sigmoid diverticulosis without findings of acute diverticulitis. No evidence of bowel wall thickening, distention, or inflammatory changes. Vascular/Lymphatic: Aortic atherosclerosis. No enlarged abdominal or pelvic lymph nodes. Reproductive: Uterus and bilateral adnexa are unremarkable. Other: No abdominal wall hernia or abnormality. No abdominopelvic ascites. Musculoskeletal: Right lower anterior abdominal wall nodular foci likely representing injection sites. IMPRESSION: 1. Mild right kidney perinephric stranding may represent underlying infection or inflammation of the renal collecting system. No hydronephrosis. 2. Right kidney interpolar punctate nonobstructive nephrolithiasis. 3. Cholelithiasis. 4. Aortic atherosclerosis. 5. Sigmoid diverticulosis. Electronically Signed   By: Mitzi Hansen M.D.   On: 06/27/2017 06:08   Ct Head Wo Contrast  Result Date: 06/26/2017 CLINICAL DATA:  Difficulty walking with slurred speech EXAM: CT HEAD WITHOUT CONTRAST TECHNIQUE: Contiguous axial images were obtained from the  base of the skull through the vertex without intravenous contrast. COMPARISON:  12/02/2011 FINDINGS: Brain: No acute territorial infarction, hemorrhage or intracranial mass is visualized. The ventricles are nonenlarged. Vascular: No hyperdense vessels. Scattered calcifications at the carotid siphons Skull: Normal. Negative for fracture or focal lesion. Sinuses/Orbits: No acute finding. Other: None IMPRESSION: No CT evidence for acute intracranial abnormality. Electronically Signed    By: Jasmine PangKim  Fujinaga M.D.   On: 06/26/2017 22:21   Dg Chest Port 1 View  Result Date: 06/27/2017 CLINICAL DATA:  61 y/o  F; shortness of breath. EXAM: PORTABLE CHEST 1 VIEW COMPARISON:  None. FINDINGS: Mildly enlarged cardiac silhouette given projection and technique. Pulmonary vascular congestion streaky lung base opacities which probably represent atelectasis. No pleural effusion or pneumothorax. Bones are unremarkable IMPRESSION: Enlarged cardiac silhouette. Pulmonary vascular congestion. Streaky lung base opacities probably representing atelectasis. Electronically Signed   By: Mitzi HansenLance  Furusawa-Stratton M.D.   On: 06/27/2017 03:48    ASSESSMENT / PLAN:  Sepsis - r/t UTI with pyelo +/- mild CAP. Initial lactate >6, did not clear with volume.  Hypotensive initially, did improve some with fluids.  Appears fairly toxic, uncomfortable.  UTI with pyelo  PLAN -  Admit ICU  Stat chem, lactate, procalcitonin Finished 30cc/kg ~ 3.2L  abx - vanc, zosyn  Narrow abx quickly as able  Pan culture  F/u CXR  Continue home prednisone  If hypotension re-occurs change to stress steroids  Hold home coreg   Dyspnea - could be r/t SIRS/sepsis syndrome but does have some bibasilar atx/ infiltrate (?CAP) +/- vascular congestion. No cough or sputum production.  PLAN -  F/u CXR  Hold further volume resuscitation  2D echo pending  Trend troponin  Check BNP  F/u ABG   AKI with metabolic acidosis  PLAN -  Volume as above  Stat chem now  Trend lactate    DM with significant hyperglycemia - urine neg for ketones  PLAN -  Check hgbA1c  SSI  Continue home lantus   Hypothyroid   PLAN -  Check TSH  Continue home synthroid   Autoimmune hepatitis  PLAN -  Continue prednisone 10mg /day  F/u LFT's    Will admit to ICU over close observation overnight, can likely tx to SDU and [facility] in am.   Dirk DressKaty Phi Avans, NP 06/27/2017  10:09 AM Pager: [phone] or [phone]

## 2017-06-27 NOTE — ED Notes (Signed)
Patient placed on 2 liters o2. Oxygen sats noted 84%. With oxygen, sats up to 95%

## 2017-06-27 NOTE — Progress Notes (Signed)
Inpatient Diabetes Program Recommendations  AACE/ADA: New Consensus Statement on Inpatient Glycemic Control (2015)  Target Ranges:  Prepandial:   less than 140 mg/dL      Peak postprandial:   less than 180 mg/dL (1-2 hours)      Critically ill patients:  140 - 180 mg/dL   Results for Dana Thornton, Dana Thornton (MRN [id number]) as of 06/27/2017 13:38  Ref. Range 06/26/2017 21:56 06/27/2017 11:07 06/27/2017 12:18  Glucose-Capillary Latest Ref Range: 65 - 99 mg/dL 782355 (H) 956271 (H) 213296 (H)   Results for Dana Thornton, Dana Thornton (MRN [id number]) as of 06/27/2017 13:38  Ref. Range 06/27/2017 10:07  Hemoglobin A1C Latest Ref Range: 4.8 - 5.6 % 13.1 (H)    Admit UTI/ Sepsis.   History: DM  Home DM meds: Lantus 30 units AM/ 10 units PM         Novolog 2-10 units TID   Current Orders: Lantus 30 units QHS      Novolog Resistant Correction Scale/ SSI (0-20 units) Q4 hours      -Note Lantus to start tonight at bedtime.  -Novolog SSI started today at 12pm.  -Went to speak with patient today about her elevated A1c of 13.1%.  Patient was lethargic and midly difficult to arouse.  Had rapid respirations and I could not hold her concentration. Gentleman at the bedside was not able to give me much information about pt's home DM regimen.  Gentleman at bedside told me pt's [place]CP [place]  -Will try to revisit with patient once she is more alert and awake and able to fully participate in a conversation.     --Will follow patient during hospitalization--  Ambrose FinlandJeannine Johnston Erion Hermans RN, MSN, CDE Diabetes Coordinator Inpatient Glycemic Control Team Team Pager: [phone] (8a-5p)

## 2017-06-27 NOTE — ED Provider Notes (Signed)
61 y.o. H.o autoimmune hepatitis diabetes presents generalized weakness, appears to have uti, lactic 6 now 5 after iv fluids. AKI creatinine 2.65 ABX- vanc zosyn Vitals:   06/27/17 0700 06/27/17 0715  BP: 125/71 122/74  Pulse: (!) 116 (!) 116  Resp: (!) 31 (!) 37  Temp:    SpO2: 92% 91%   9:25 AM Gas with critical care. Patient requiring oxygen now. She is awake and alert and states that she feels somewhat improved although is now slightly dyspneic. Vitals:   06/27/17 0900 06/27/17 0915  BP: 121/71 123/70  Pulse: (!) 118 (!) 116  Resp: (!) 21 (!) 37  Temp:    SpO2: 96% 94%   Sepsis - Repeat Assessment  Performed at:    9:26 AM   Vitals     Blood pressure 123/70, pulse (!) 116, temperature 97.9 F (36.6 C), temperature source Oral, resp. rate (!) 37, height 1.575 m (5\' 2" ), SpO2 94 %.  Heart:     Tachycardic  Lungs:    Rhonchi  Capillary Refill:   <2 sec  Peripheral Pulse:   Radial pulse palpable  Skin:     Diaphoretic     Margarita Grizzleay, Daymeon Fischman, MD 06/27/17 [phone]

## 2017-06-27 NOTE — Progress Notes (Signed)
PHARMACY - PHYSICIAN COMMUNICATION CRITICAL VALUE ALERT - BLOOD CULTURE IDENTIFICATION (BCID)  Results for orders placed or performed during the hospital encounter of 06/27/17  Blood Culture ID Panel (Reflexed) (Collected: 06/27/2017  4:20 AM)  Result Value Ref Range   Enterococcus species NOT DETECTED NOT DETECTED   Listeria monocytogenes NOT DETECTED NOT DETECTED   Staphylococcus species NOT DETECTED NOT DETECTED   Staphylococcus aureus NOT DETECTED NOT DETECTED   Streptococcus species NOT DETECTED NOT DETECTED   Streptococcus agalactiae NOT DETECTED NOT DETECTED   Streptococcus pneumoniae NOT DETECTED NOT DETECTED   Streptococcus pyogenes NOT DETECTED NOT DETECTED   Acinetobacter baumannii NOT DETECTED NOT DETECTED   Enterobacteriaceae species DETECTED (A) NOT DETECTED   Enterobacter cloacae complex NOT DETECTED NOT DETECTED   Escherichia coli DETECTED (A) NOT DETECTED   Klebsiella oxytoca NOT DETECTED NOT DETECTED   Klebsiella pneumoniae NOT DETECTED NOT DETECTED   Proteus species NOT DETECTED NOT DETECTED   Serratia marcescens NOT DETECTED NOT DETECTED   Carbapenem resistance NOT DETECTED NOT DETECTED   Haemophilus influenzae NOT DETECTED NOT DETECTED   Neisseria meningitidis NOT DETECTED NOT DETECTED   Pseudomonas aeruginosa NOT DETECTED NOT DETECTED   Candida albicans NOT DETECTED NOT DETECTED   Candida glabrata NOT DETECTED NOT DETECTED   Candida krusei NOT DETECTED NOT DETECTED   Candida parapsilosis NOT DETECTED NOT DETECTED   Candida tropicalis NOT DETECTED NOT DETECTED    Name of physician (or Provider) Contacted: Dr. Tyson AliasFeinstein   Changes to prescribed antibiotics required: stop vancomycin, continue Zosyn.   Pollyann SamplesAndy Colletta Spillers, PharmD, BCPS 06/27/2017, 7:49 PM

## 2017-06-27 NOTE — ED Notes (Signed)
Admitting made aware of lactic and troponin

## 2017-06-27 NOTE — ED Notes (Signed)
Bladder scanned pt, pt had <50 ml.

## 2017-06-27 NOTE — Progress Notes (Signed)
Pharmacy Antibiotic Note  Dana Thornton is a 61 y.o. female admitted on 06/27/2017 with AMS, possible urosepsis.  Pharmacy has been consulted for Vancomycin and Zosyn  Dosing.  Vancomycin 1 g IV given in ED at 0500  Plan: Vancomycin 1 g IV q24h, first dose now Zosyn 3.375 g IV q8h      Temp (24hrs), Avg:97.9 F (36.6 C), Min:97.9 F (36.6 C), Max:97.9 F (36.6 C)   Recent Labs Lab 06/26/17 2156 06/26/17 2215 06/27/17 0408 06/27/17 0654  WBC 13.9*  --   --   --   CREATININE 2.65* 2.60*  --   --   LATICACIDVEN  --   --  6.02* 5.22*    CrCl cannot be calculated (Unknown ideal weight.).    Allergies  Allergen Reactions  . Codeine Nausea And Vomiting    Dana Thornton, Dana Thornton 06/27/2017 7:46 AM

## 2017-06-27 NOTE — Progress Notes (Signed)
eLink Physician-Brief Progress Note Patient Name: Dana LamerGloria W Kassner DOB: 07-05-1956 MRN: [id number]   Date of Service  06/27/2017  HPI/Events of Note  BCID E coli strsnding on CT Dc vanc Keep polymicobial zosyn for now and follow sens  If sens narrow  eICU Interventions       Intervention Category Major Interventions: Infection - evaluation and management  Nelda BucksFEINSTEIN,Sumayah Bearse J. 06/27/2017, 7:47 PM

## 2017-06-27 NOTE — ED Notes (Signed)
Called CT to see where pt is on their list.

## 2017-06-27 NOTE — ED Provider Notes (Signed)
[facility] Provider Note   CSN: [id number] Arrival date & time: 06/26/17  2136     History   Chief Complaint Chief Complaint  Patient presents with  . Cerebrovascular Accident   LEVEL 5 CAVEAT DUE TO ACUITY OF CONDITION  HPI Dana Thornton is a 61 y.o. female.  The history is provided by the patient.  Weakness  This is a new problem. The current episode started more than 2 days ago. The problem has been gradually worsening. There was no focality noted. There has been no fever. Associated symptoms include shortness of breath.   Patient presents for weakness Pt with known h/o autoimmune hepatitis Per husband, she had flu shot last week Several days later she reported having muscle pain in her shoulder She went to an urgent care and received NSAIDs and flexeril Over past 2 days she has had generalized weakness No fever/vomiting/HA She feels short of breath   Past Medical History:  Diagnosis Date  . Hypercholesterolemia   . Hypertension   . Kidney stones   . Type II diabetes mellitus [facility](HCC)     Patient Active Problem List   Diagnosis Date Noted  . Hyperglycemia 04/09/2015  . DKA (diabetic ketoacidoses) (HCC) 04/09/2015  . Hypotension 04/09/2015  . ARF (acute renal failure) (HCC) 04/09/2015  . Transaminitis 04/09/2015  . Shock liver 04/09/2015  . Lactic acidosis 04/09/2015  . Protein-calorie malnutrition, severe (HCC) 03/30/2015  . Oral thrush 03/29/2015  . Poor appetite 03/29/2015  . Uncontrolled diabetes mellitus type 2 without complications (HCC) 03/29/2015  . Vol depletion 03/28/2015  . Aspiration pneumonia (HCC) 12/07/2011  . Hypernatremia 12/07/2011  . HTN (hypertension) 12/07/2011  . Diabetes type 2, uncontrolled (HCC) 12/03/2011  . Autoimmune hepatitis (HCC) 12/03/2011    Past Surgical History:  Procedure Laterality Date  . CESAREAN SECTION  1999  . DILATION AND CURETTAGE OF UTERUS     "I lost a baby"  .  TUBAL LIGATION  1999    OB History    No data available       Home Medications    Prior to Admission medications   Medication Sig Start Date End Date Taking? Authorizing Provider  aspirin 325 MG tablet Take 325 mg by mouth daily.    [provider]  carvedilol (COREG) 6.25 MG tablet Take 6.25 mg by mouth 2 (two) times daily with a meal.    [provider]  fluconazole (DIFLUCAN) 100 MG tablet Take 1 tablet (100 mg total) by mouth daily. 04/13/15   Osvaldo ShipperKrishnan, Gokul, MD  insulin aspart (NOVOLOG) 100 UNIT/ML injection Before each meal 3 times a day, 120-150 - 2 units, 151-200 - 4 units, 201-250 - 8 units, 251-300 - 10 units,  301 or above 10 units and call your MD 12/10/11   Leroy SeaSingh, Prashant K, MD  insulin glargine (LANTUS) 100 UNIT/ML injection Take 30 Units in the morning and 10 units at Night. 04/13/15   Osvaldo ShipperKrishnan, Gokul, MD  levothyroxine (SYNTHROID, LEVOTHROID) 150 MCG tablet Take 1 tablet (150 mcg total) by mouth daily before breakfast. 04/13/15   Osvaldo ShipperKrishnan, Gokul, MD  mercaptopurine (PURINETHOL) 50 MG tablet Take 50 mg by mouth daily. Give on an empty stomach 1 hour before or 2 hours after meals. Caution: Chemotherapy.    [provider]  nystatin (MYCOSTATIN) 100000 UNIT/ML suspension Take 5 mLs (500,000 Units total) by mouth 4 (four) times daily. 04/03/15   Dorothea OgleMyers, Iskra M, MD  ondansetron (ZOFRAN-ODT) 4 MG disintegrating tablet  Take 4 mg by mouth every 8 (eight) hours as needed for nausea or vomiting.    [provider]  predniSONE (DELTASONE) 20 MG tablet Take 2 tablets (40 mg total) by mouth daily with breakfast. 04/13/15   Osvaldo Shipper, MD    Family History Family History  Problem Relation Age of Onset  . Kidney failure Father   . Hypertension Father     Social History Social History  Substance Use Topics  . Smoking status: Never Smoker  . Smokeless tobacco: Never Used  . Alcohol use No     Allergies   Codeine   Review of Systems Review  of Systems  Unable to perform ROS: Acuity of condition  Respiratory: Positive for shortness of breath.   Neurological: Positive for weakness.     Physical Exam Updated Vital Signs BP (!) 83/56 (BP Location: Right Arm)   Pulse (!) 114   Temp 97.9 F (36.6 C) (Oral)   Resp 20   SpO2 97%   Physical Exam CONSTITUTIONAL: Elderly, ill appearing, distress noted HEAD: Normocephalic/atraumatic EYES: EOMI/PERRL, icterus noted ENMT: Mucous membranes dry NECK: supple no meningeal signs SPINE/BACK:entire spine nontender CV: S1/S2 noted,tachycardic, no loud murmurs LUNGS: tachypneic, coarse BS noted bilaterally ABDOMEN: soft, diffuse tenderness GU:no cva tenderness NEURO: Pt is awake/alert/appropriate, moves all extremitiesx4.  No facial droop.  No arm/leg drift.   EXTREMITIES: pulses normal/equal, full ROM SKIN: warm, color normal PSYCH: anxious  ED Treatments / Results  Labs (all labs ordered are listed, but only abnormal results are displayed) Labs Reviewed  PROTIME-INR - Abnormal; Notable for the following:       Result Value   Prothrombin Time 19.5 (*)    All other components within normal limits  APTT - Abnormal; Notable for the following:    aPTT 39 (*)    All other components within normal limits  CBC - Abnormal; Notable for the following:    WBC 13.9 (*)    HCT 34.4 (*)    RDW 16.9 (*)    Platelets 55 (*)    All other components within normal limits  DIFFERENTIAL - Abnormal; Notable for the following:    Neutro Abs 12.6 (*)    All other components within normal limits  COMPREHENSIVE METABOLIC PANEL - Abnormal; Notable for the following:    Sodium 130 (*)    Chloride 97 (*)    CO2 21 (*)    Glucose, Bld 364 (*)    BUN 48 (*)    Creatinine, Ser 2.65 (*)    Calcium 8.1 (*)    Albumin 2.0 (*)    AST 44 (*)    Alkaline Phosphatase 279 (*)    Total Bilirubin 3.6 (*)    GFR calc non Af Amer 18 (*)    GFR calc Af Amer 21 (*)    All other components within normal  limits  URINALYSIS, ROUTINE W REFLEX MICROSCOPIC - Abnormal; Notable for the following:    Color, Urine AMBER (*)    APPearance TURBID (*)    Hgb urine dipstick LARGE (*)    Protein, ur 100 (*)    Leukocytes, UA MODERATE (*)    Bacteria, UA MANY (*)    Squamous Epithelial / LPF 6-30 (*)    Non Squamous Epithelial 6-30 (*)    All other components within normal limits  AMMONIA - Abnormal; Notable for the following:    Ammonia 37 (*)    All other components within normal limits  CK -  Abnormal; Notable for the following:    Total CK 16 (*)    All other components within normal limits  CBG MONITORING, ED - Abnormal; Notable for the following:    Glucose-Capillary 355 (*)    All other components within normal limits  I-STAT CHEM 8, ED - Abnormal; Notable for the following:    Chloride 97 (*)    BUN 45 (*)    Creatinine, Ser 2.60 (*)    Glucose, Bld 364 (*)    Calcium, Ion 1.08 (*)    All other components within normal limits  I-STAT CG4 LACTIC ACID, ED - Abnormal; Notable for the following:    Lactic Acid, Venous 6.02 (*)    All other components within normal limits  I-STAT ARTERIAL BLOOD GAS, ED - Abnormal; Notable for the following:    pH, Arterial 7.335 (*)    pCO2 arterial 31.6 (*)    pO2, Arterial 60.0 (*)    Bicarbonate 16.9 (*)    TCO2 18 (*)    Acid-base deficit 8.0 (*)    All other components within normal limits  URINE CULTURE  CULTURE, BLOOD (ROUTINE X 2)  CULTURE, BLOOD (ROUTINE X 2)  I-STAT TROPONIN, ED  I-STAT CG4 LACTIC ACID, ED  I-STAT CG4 LACTIC ACID, ED    EKG  EKG Interpretation  Date/Time:  Wednesday June 26 2017 21:45:35 EDT Ventricular Rate:  126 PR Interval:  132 QRS Duration: 76 QT Interval:  308 QTC Calculation: 446 R Axis:   32 Text Interpretation:  Sinus tachycardia Nonspecific T wave abnormality Abnormal ECG Confirmed by Zadie Rhine ([id number]) on 06/27/2017 3:17:42 AM       Radiology Ct Abdomen Pelvis Wo Contrast  Result Date:  06/27/2017 CLINICAL DATA:  61 y/o  F; abdominal pain. EXAM: CT ABDOMEN AND PELVIS WITHOUT CONTRAST TECHNIQUE: Multidetector CT imaging of the abdomen and pelvis was performed following the standard protocol without IV contrast. COMPARISON:  12/03/2011 CT of abdomen and pelvis. FINDINGS: Lower chest: No acute abnormality. Hepatobiliary: No focal liver lesion. Cholelithiasis. No intra or extrahepatic biliary ductal dilatation. Pancreas: Unremarkable. No pancreatic ductal dilatation or surrounding inflammatory changes. Spleen: Normal in size without focal abnormality. Adrenals/Urinary Tract: Normal adrenal glands. Multiple lucent foci are present within the right kidney with fluid attenuation, well-circumscribed, compatible with cysts. The largest cyst is in the interpolar region measuring 4 cm. Similar subcentimeter focus is present in left interpolar kidney. No hydronephrosis. Punctate densities in right interpolar kidney (series 6, image 81) are compatible with nephrolithiasis. Normal bladder. Mild right kidney perinephric stranding. Stomach/Bowel: Stomach is within normal limits. Sigmoid diverticulosis without findings of acute diverticulitis. No evidence of bowel wall thickening, distention, or inflammatory changes. Vascular/Lymphatic: Aortic atherosclerosis. No enlarged abdominal or pelvic lymph nodes. Reproductive: Uterus and bilateral adnexa are unremarkable. Other: No abdominal wall hernia or abnormality. No abdominopelvic ascites. Musculoskeletal: Right lower anterior abdominal wall nodular foci likely representing injection sites. IMPRESSION: 1. Mild right kidney perinephric stranding may represent underlying infection or inflammation of the renal collecting system. No hydronephrosis. 2. Right kidney interpolar punctate nonobstructive nephrolithiasis. 3. Cholelithiasis. 4. Aortic atherosclerosis. 5. Sigmoid diverticulosis. Electronically Signed   By: Mitzi Hansen M.D.   On: 06/27/2017 06:08    Ct Head Wo Contrast  Result Date: 06/26/2017 CLINICAL DATA:  Difficulty walking with slurred speech EXAM: CT HEAD WITHOUT CONTRAST TECHNIQUE: Contiguous axial images were obtained from the base of the skull through the vertex without intravenous contrast. COMPARISON:  12/02/2011 FINDINGS: Brain: No acute territorial infarction,  hemorrhage or intracranial mass is visualized. The ventricles are nonenlarged. Vascular: No hyperdense vessels. Scattered calcifications at the carotid siphons Skull: Normal. Negative for fracture or focal lesion. Sinuses/Orbits: No acute finding. Other: None IMPRESSION: No CT evidence for acute intracranial abnormality. Electronically Signed   By: Jasmine Pang M.D.   On: 06/26/2017 22:21   Dg Chest Port 1 View  Result Date: 06/27/2017 CLINICAL DATA:  61 y/o  F; shortness of breath. EXAM: PORTABLE CHEST 1 VIEW COMPARISON:  None. FINDINGS: Mildly enlarged cardiac silhouette given projection and technique. Pulmonary vascular congestion streaky lung base opacities which probably represent atelectasis. No pleural effusion or pneumothorax. Bones are unremarkable IMPRESSION: Enlarged cardiac silhouette. Pulmonary vascular congestion. Streaky lung base opacities probably representing atelectasis. Electronically Signed   By: Mitzi Hansen M.D.   On: 06/27/2017 03:48    Procedures Procedures  CRITICAL CARE Performed by: Joya Gaskins Total critical care time: 45 minutes Critical care time was exclusive of separately billable procedures and treating other patients. Critical care was necessary to treat or prevent imminent or life-threatening deterioration. Critical care was time spent personally by me on the following activities: development of treatment plan with patient and/or surrogate as well as nursing, discussions with consultants, evaluation of patient's response to treatment, examination of patient, obtaining history from patient or surrogate, ordering and  performing treatments and interventions, ordering and review of laboratory studies, ordering and review of radiographic studies, pulse oximetry and re-evaluation of patient's condition.   Medications Ordered in ED Medications  sodium chloride 0.9 % bolus 1,000 mL (0 mLs Intravenous Stopped 06/27/17 0527)    And  sodium chloride 0.9 % bolus 1,000 mL (1,000 mLs Intravenous New Bag/Given 06/27/17 1610)    And  sodium chloride 0.9 % bolus 1,000 mL (1,000 mLs Intravenous New Bag/Given 06/27/17 0625)    And  sodium chloride 0.9 % bolus 500 mL (not administered)  potassium chloride 10 MEQ/100ML IVPB (not administered)  sodium chloride 0.9 % bolus 1,000 mL (0 mLs Intravenous Stopped 06/27/17 0622)  piperacillin-tazobactam (ZOSYN) IVPB 3.375 g (0 g Intravenous Stopped 06/27/17 0526)  vancomycin (VANCOCIN) IVPB 1000 mg/200 mL premix (0 mg Intravenous Stopped 06/27/17 0721)     Initial Impression / Assessment and Plan / ED Course  I have reviewed the triage vital signs and the nursing notes.  Pertinent labs & imaging results that were available during my care of the patient were reviewed by me and considered in my medical decision making (see chart for details).     4:17 AM Pt seen soon after arrival to room Initial chief complaint was for CVA However, pt is tachycardic, hypotensive No gross focal weakness She is tachypneic,, suspicion for sepsis of unknown etiology due to lactate >6 IV fluids/antibiotics ordered and code sepsis called Will follow closely 4:33 AM Pt awake/alert No meningeal signs IV fluids infusing Will get CT imaging of abd/pelvis due to diffuse abdominal tenderness in the setting of sepsis  Sepsis - Repeat Assessment  Performed at:    0455  Vitals     Blood pressure (!) 83/56, pulse (!) 114, temperature 97.9 F (36.6 C), temperature source Oral, resp. rate 20, SpO2 97 %.  Heart:     Tachycardic  Lungs:    Rhonchi  Capillary Refill:   <2 sec  Peripheral  Pulse:   Radial pulse palpable  Skin:     Normal Color  6:47 AM Pt is improving Vitals improved SBP has been ranging from 99-141 She is mentating appropriately Heart  rate improving PYELO noted on CT imaging Will need admission 7:45 AM Signed out to dr ray with consult to critical care pending Patient did not clear her lactate after adequate fluid resuscitation BP 122/74   Pulse (!) 116   Temp 97.9 F (36.6 C) (Oral)   Resp (!) 37   SpO2 91%    Final Clinical Impressions(s) / ED Diagnoses   Final diagnoses:  AKI (acute kidney injury) (HCC)  Dehydration  Sepsis, due to unspecified organism [facility])  Pyelonephritis    New Prescriptions New Prescriptions   No medications on file     Zadie Rhine, MD 06/27/17 [phone]

## 2017-06-28 ENCOUNTER — Inpatient Hospital Stay (HOSPITAL_COMMUNITY): Payer: 59

## 2017-06-28 DIAGNOSIS — R6521 Severe sepsis with septic shock: Secondary | ICD-10-CM

## 2017-06-28 DIAGNOSIS — N10 Acute tubulo-interstitial nephritis: Secondary | ICD-10-CM

## 2017-06-28 DIAGNOSIS — A419 Sepsis, unspecified organism: Secondary | ICD-10-CM

## 2017-06-28 DIAGNOSIS — N179 Acute kidney failure, unspecified: Secondary | ICD-10-CM

## 2017-06-28 LAB — SODIUM, URINE, RANDOM

## 2017-06-28 LAB — URINALYSIS, ROUTINE W REFLEX MICROSCOPIC
Bilirubin Urine: NEGATIVE
GLUCOSE, UA: NEGATIVE mg/dL
KETONES UR: NEGATIVE mg/dL
Nitrite: NEGATIVE
PROTEIN: 100 mg/dL — AB
Specific Gravity, Urine: 1.021 (ref 1.005–1.030)
pH: 5 (ref 5.0–8.0)

## 2017-06-28 LAB — GLUCOSE, CAPILLARY
GLUCOSE-CAPILLARY: 90 mg/dL (ref 65–99)
GLUCOSE-CAPILLARY: 98 mg/dL (ref 65–99)
GLUCOSE-CAPILLARY: 138 mg/dL — AB (ref 65–99)

## 2017-06-28 LAB — BLOOD GAS, ARTERIAL
ACID-BASE DEFICIT: 9.2 mmol/L — AB (ref 0.0–2.0)
Bicarbonate: 15.2 mmol/L — ABNORMAL LOW (ref 20.0–28.0)
Drawn by: 252031
O2 CONTENT: 3 L/min
O2 SAT: 96.4 %
PATIENT TEMPERATURE: 98.6
PO2 ART: 89.1 mmHg (ref 83.0–108.0)
pCO2 arterial: 28.1 mmHg — ABNORMAL LOW (ref 32.0–48.0)
pH, Arterial: 7.354 (ref 7.350–7.450)

## 2017-06-28 LAB — PROCALCITONIN: Procalcitonin: 48.82 ng/mL

## 2017-06-28 LAB — TSH: TSH: 18.576 u[IU]/mL — ABNORMAL HIGH (ref 0.350–4.500)

## 2017-06-28 LAB — BASIC METABOLIC PANEL
Anion gap: 13 (ref 5–15)
Anion gap: 10 (ref 5–15)
BUN: 60 mg/dL — ABNORMAL HIGH (ref 6–20)
BUN: 63 mg/dL — ABNORMAL HIGH (ref 6–20)
CHLORIDE: 109 mmol/L (ref 101–111)
CO2: 17 mmol/L — ABNORMAL LOW (ref 22–32)
CO2: 15 mmol/L — ABNORMAL LOW (ref 22–32)
Calcium: 7.1 mg/dL — ABNORMAL LOW (ref 8.9–10.3)
Calcium: 6.7 mg/dL — ABNORMAL LOW (ref 8.9–10.3)
Chloride: 106 mmol/L (ref 101–111)
Creatinine, Ser: 3.49 mg/dL — ABNORMAL HIGH (ref 0.44–1.00)
Creatinine, Ser: 3.48 mg/dL — ABNORMAL HIGH (ref 0.44–1.00)
GFR calc Af Amer: 15 mL/min — ABNORMAL LOW (ref >60)
GFR calc non Af Amer: 13 mL/min — ABNORMAL LOW (ref >60)
GFR calc non Af Amer: 13 mL/min — ABNORMAL LOW (ref >60)
GFR, EST AFRICAN AMERICAN: 15 mL/min — AB (ref >60)
Glucose, Bld: 102 mg/dL — ABNORMAL HIGH (ref 65–99)
Glucose, Bld: 173 mg/dL — ABNORMAL HIGH (ref 65–99)
POTASSIUM: 4.3 mmol/L (ref 3.5–5.1)
Potassium: 4 mmol/L (ref 3.5–5.1)
SODIUM: 134 mmol/L — AB (ref 135–145)
Sodium: 136 mmol/L (ref 135–145)

## 2017-06-28 LAB — OSMOLALITY, URINE: Osmolality, Ur: 304 mOsm/kg (ref 300–900)

## 2017-06-28 LAB — CBC
HCT: 31 % — ABNORMAL LOW (ref 36.0–46.0)
Hemoglobin: 10.9 g/dL — ABNORMAL LOW (ref 12.0–15.0)
MCH: 30 pg (ref 26.0–34.0)
MCHC: 35.2 g/dL (ref 30.0–36.0)
MCV: 85.4 fL (ref 78.0–100.0)
Platelets: 27 10*3/uL — CL (ref 150–400)
RBC: 3.63 MIL/uL — ABNORMAL LOW (ref 3.87–5.11)
RDW: 17.3 % — ABNORMAL HIGH (ref 11.5–15.5)
WBC: 21.7 10*3/uL — ABNORMAL HIGH (ref 4.0–10.5)

## 2017-06-28 LAB — PHOSPHORUS: Phosphorus: 2.2 mg/dL — ABNORMAL LOW (ref 2.5–4.6)

## 2017-06-28 LAB — T4, FREE: Free T4: 0.55 ng/dL — ABNORMAL LOW (ref 0.61–1.12)

## 2017-06-28 LAB — HIV ANTIBODY (ROUTINE TESTING W REFLEX): HIV Screen 4th Generation wRfx: NONREACTIVE

## 2017-06-28 LAB — MAGNESIUM: MAGNESIUM: 1.3 mg/dL — AB (ref 1.7–2.4)

## 2017-06-28 LAB — CORTISOL: Cortisol, Plasma: 24 ug/dL

## 2017-06-28 MED ORDER — SODIUM CHLORIDE 0.9 % IV SOLN
0.0000 ug/min | INTRAVENOUS | Status: DC
  Filled 2017-06-28: qty 1

## 2017-06-28 MED ORDER — HYDROCORTISONE NA SUCCINATE PF 100 MG IJ SOLR
50.0000 mg | Freq: Three times a day (TID) | INTRAMUSCULAR | Status: DC
  Administered 2017-06-28 – 2017-06-29 (×3): 50 mg via INTRAVENOUS
  Filled 2017-06-28: qty 2
  Filled 2017-06-28 (×3): qty 1
  Filled 2017-06-28: qty 2

## 2017-06-28 MED ORDER — SODIUM CHLORIDE 0.9 % IV BOLUS (SEPSIS)
500.0000 mL | Freq: Once | INTRAVENOUS | Status: AC
  Administered 2017-06-28: 500 mL via INTRAVENOUS

## 2017-06-28 MED ORDER — MAGNESIUM SULFATE 4 GM/100ML IV SOLN
4.0000 g | Freq: Once | INTRAVENOUS | Status: AC
  Administered 2017-06-28: 4 g via INTRAVENOUS
  Filled 2017-06-28: qty 100

## 2017-06-28 MED ORDER — PIPERACILLIN-TAZOBACTAM IN DEX 2-0.25 GM/50ML IV SOLN
2.2500 g | Freq: Three times a day (TID) | INTRAVENOUS | Status: DC
  Administered 2017-06-28 – 2017-06-29 (×3): 2.25 g via INTRAVENOUS
  Filled 2017-06-28 (×5): qty 50

## 2017-06-28 NOTE — Progress Notes (Signed)
eLink Physician-Brief Progress Note Patient Name: Darene LamerGloria W Lamp DOB: [birth year] MRN: [id number]   Date of Service  06/28/2017  HPI/Events of Note  Low output pcxr without overload and on min o2 No cvp, line Bladder scan low   ARF worsening, unclear volume status  Place foley UA Urine osm Urine na bolus  eICU Interventions       Intervention Category Intermediate Interventions: Oliguria - evaluation and management  FEINSTEIN,DANIEL J. 06/28/2017, 6:06 AM

## 2017-06-28 NOTE — Progress Notes (Signed)
Name: Dana Thornton MRN: [id number] DOB: 08-20-1956    ADMISSION DATE:  06/27/2017 CONSULTATION DATE:  10/18   REFERRING MD :  EDP  CHIEF COMPLAINT:  UTI   BRIEF PATIENT DESCRIPTION: 61yo female with hx autoimmune hepatitis, HTN, DM presented 10/17 with weakness, difficulty speaking, SOB.   Initial triage concern was for CVA but in ER found to be hypotensive, tachycardic without any focal deficits.  Initial lactate >6, u/a showed UTI, CT abd concerning for pyelo, AKI with Scr 2.65.   BP improved but lactate did not clear while in ER so PCCM consulted for ?ICU needs.    HISTORY OF PRESENT ILLNESS:  61yo female with hx autoimmune hepatitis, HTN, DM presented 10/17 with weakness, difficulty speaking, SOB.   Initial triage concern was for CVA but in ER found to be hypotensive, tachycardic without any focal deficits.  Initial lactate >6, u/a showed UTI, CT abd concerning for pyelo.   BP improved but lactate did not clear while in ER so PCCM consulted for ?ICU needs.  Currently feeling a bit better although still c/o lower back pain, dyspnea, general malaise. Denies chest pain, headache, hemoptysis, cough, fever, neck pain/stiffness, n/v/d, myalgias.   Recently dropped to 10mg /day of prednisone for autoimmune hepatitis.  Had her flu shot about 1 week prior to onset symptoms.    SUBJECTIVE:  Overnight, patient was noted to have worsening acute renal failure. Urine osm, urine Na were sent. She was given a bolus.  Additionally, BCID showed E Coli. Vancomycin was discontinued. Zosyn was kept while awaiting sensitivities.  VITAL SIGNS: Temp:  [97.5 F (36.4 C)-98.5 F (36.9 C)] 98.5 F (36.9 C) (10/19 0344) Pulse Rate:  [102-120] 111 (10/19 0700) Resp:  [20-39] 33 (10/19 0700) BP: (86-151)/(38-98) 103/59 (10/19 0700) SpO2:  [84 %-98 %] 94 % (10/19 0700) Weight:  [238 lb 5.1 oz (108.1 kg)-238 lb 8.6 oz (108.2 kg)] 238 lb 8.6 oz (108.2 kg) (10/19 0500)  PHYSICAL EXAMINATION: General:  NAD,  rests in bed, denies any pain Neuro:  Awake and alert. Speech is mildly slurred. Oriented x3  Cardiovascular:  RRR, no m/r/g Lungs:  CTA anteriorly, tachypnic, no W/R/R  Abdomen:  Soft and nontender, nondistended Musculoskeletal:  No LE edema   Recent Labs Lab 06/26/17 2156 06/26/17 2215 06/27/17 1003 06/28/17 0425  NA 130* 135 130* 136  K 3.8 4.0 4.3 4.0  CL 97* 97* 103 106  CO2 21*  --  13* 17*  BUN 48* 45* 48* 60*  CREATININE 2.65* 2.60* 3.15* 3.49*  GLUCOSE 364* 364* 271* 102*    Recent Labs Lab 06/26/17 2156 06/26/17 2215 06/28/17 0425  HGB 12.2 12.9 10.9*  HCT 34.4* 38.0 31.0*  WBC 13.9*  --  21.7*  PLT 55*  --  27*   Ct Abdomen Pelvis Wo Contrast  Result Date: 06/27/2017 CLINICAL DATA:  61 y/o  F; abdominal pain. EXAM: CT ABDOMEN AND PELVIS WITHOUT CONTRAST TECHNIQUE: Multidetector CT imaging of the abdomen and pelvis was performed following the standard protocol without IV contrast. COMPARISON:  12/03/2011 CT of abdomen and pelvis. FINDINGS: Lower chest: No acute abnormality. Hepatobiliary: No focal liver lesion. Cholelithiasis. No intra or extrahepatic biliary ductal dilatation. Pancreas: Unremarkable. No pancreatic ductal dilatation or surrounding inflammatory changes. Spleen: Normal in size without focal abnormality. Adrenals/Urinary Tract: Normal adrenal glands. Multiple lucent foci are present within the right kidney with fluid attenuation, well-circumscribed, compatible with cysts. The largest cyst is in the interpolar region measuring 4 cm. Similar subcentimeter  focus is present in left interpolar kidney. No hydronephrosis. Punctate densities in right interpolar kidney (series 6, image 81) are compatible with nephrolithiasis. Normal bladder. Mild right kidney perinephric stranding. Stomach/Bowel: Stomach is within normal limits. Sigmoid diverticulosis without findings of acute diverticulitis. No evidence of bowel wall thickening, distention, or inflammatory changes.  Vascular/Lymphatic: Aortic atherosclerosis. No enlarged abdominal or pelvic lymph nodes. Reproductive: Uterus and bilateral adnexa are unremarkable. Other: No abdominal wall hernia or abnormality. No abdominopelvic ascites. Musculoskeletal: Right lower anterior abdominal wall nodular foci likely representing injection sites. IMPRESSION: 1. Mild right kidney perinephric stranding may represent underlying infection or inflammation of the renal collecting system. No hydronephrosis. 2. Right kidney interpolar punctate nonobstructive nephrolithiasis. 3. Cholelithiasis. 4. Aortic atherosclerosis. 5. Sigmoid diverticulosis. Electronically Signed   By: Mitzi Hansen M.D.   On: 06/27/2017 06:08   Ct Head Wo Contrast  Result Date: 06/26/2017 CLINICAL DATA:  Difficulty walking with slurred speech EXAM: CT HEAD WITHOUT CONTRAST TECHNIQUE: Contiguous axial images were obtained from the base of the skull through the vertex without intravenous contrast. COMPARISON:  12/02/2011 FINDINGS: Brain: No acute territorial infarction, hemorrhage or intracranial mass is visualized. The ventricles are nonenlarged. Vascular: No hyperdense vessels. Scattered calcifications at the carotid siphons Skull: Normal. Negative for fracture or focal lesion. Sinuses/Orbits: No acute finding. Other: None IMPRESSION: No CT evidence for acute intracranial abnormality. Electronically Signed   By: Jasmine Pang M.D.   On: 06/26/2017 22:21   Dg Chest Port 1 View  Result Date: 06/27/2017 CLINICAL DATA:  61 y/o  F; shortness of breath. EXAM: PORTABLE CHEST 1 VIEW COMPARISON:  None. FINDINGS: Mildly enlarged cardiac silhouette given projection and technique. Pulmonary vascular congestion streaky lung base opacities which probably represent atelectasis. No pleural effusion or pneumothorax. Bones are unremarkable IMPRESSION: Enlarged cardiac silhouette. Pulmonary vascular congestion. Streaky lung base opacities probably representing  atelectasis. Electronically Signed   By: Mitzi Hansen M.D.   On: 06/27/2017 03:48   SIGNIFICANT EVENTS   STUDIES:  CT head 10/17>>> neg acute  CT abd/pelvis 10/18>>>1. Mild right kidney perinephric stranding may represent underlying infection or inflammation of the renal collecting system. No hydronephrosis. 2. Right kidney interpolar punctate nonobstructive nephrolithiasis. 3. Cholelithiasis. 4. Aortic atherosclerosis. 5. Sigmoid diverticulosis.   ANTIBIOTICS Vancomycin 10/18 >> 10/19 Zosyn 10/18 >>   ASSESSMENT / PLAN: INFECTIOUS Sepsis - r/t UTI with pyelo +/- mild CAP. Initial lactate >6, improving. Hypotensive initially, did improve some with fluids.  UTI with pyelo  - BCID with E Colil PLAN -  Finished 30cc/kg ~ 3.2L   DC Vanc, continue Zosyn Await culture susceptibilities Continue home prednisone; no stress dose steroids indicated at this time  Hold home coreg   PULMONARY Dyspnea - could be r/t SIRS/sepsis syndrome but does have some bibasilar atx/ infiltrate (?CAP) +/- vascular congestion. No cough or sputum production.  ABG 7.335/31.6/60/16.9 PLAN -  Continue abx as above  CARDIOLOGY - dyspnea; initial concern for new heart failure in exacerbation, however Echo LVEF 60-65%, wall motion normal BNP 82.4 Troponin >>>0.07 > PLAN: Monitor fluid status  RENAL AKI with metabolic acidosis - creatinine and BUN worsening 10/19. Initially thought to be prerenal in the setting of septic shock, however with worsening ARF, urine osm, urine Na sent. Lactate improving; peaked at 6.02 PLAN -  Volume as above  Follow up urine studies Trend lactate   ENDOCRINE DM with significant hyperglycemia - urine neg for ketones. Hyperglycemia improving overnight. HbA1c elevated at 13.1 this admission. PLAN -  Resistant  sliding scale Continue home lantus 30u QHS  Hypothyroid  TSH markedly elevated at 18.576 - on synthroid 150 mcg daily but unclear if she has been taking  this PLAN -  Check T3, T4 Continue home synthroid, likely needs to be increased  Autoimmune hepatitis - mildly elevated LFT's, high bili.  PLAN -  Continue prednisone 10mg /day  Monitor LFT's   Howard Pouch, MD PGY-2 [facility]

## 2017-06-28 NOTE — Progress Notes (Addendum)
Inpatient Diabetes Program Recommendations  AACE/ADA: New Consensus Statement on Inpatient Glycemic Control (2015)  Target Ranges:  Prepandial:   less than 140 mg/dL      Peak postprandial:   less than 180 mg/dL (1-2 hours)      Critically ill patients:  140 - 180 mg/dL   Lab Results  Component Value Date   GLUCAP 138 (H) 06/28/2017   HGBA1C 13.1 (H) 06/27/2017    Review of Glycemic ControlResults for Dana Thornton, [name] (MRN [id number]) as of 06/28/2017 15:20  Ref. Range 06/27/2017 20:34 06/27/2017 23:52 06/28/2017 03:23 06/28/2017 08:20 06/28/2017 11:34  Glucose-Capillary Latest Ref Range: 65 - 99 mg/dL 409304 (H) 811104 (H) 90 98 914138 (H)   Admit UTI/ Sepsis.   History: DM  Home DM meds: Lantus 30 units AM/ 10 units PM                               Novolog 2-10 units TID   Current Orders: Lantus 30 units QHS                            Novolog Resistant Correction Scale/ SSI (0-20 units) Q4 hours Inpatient Diabetes Program Recommendations:    Attempted to speak with patient today regarding elevated A1C.  She was not very engaged in conversation.  I explained to her and her husband that her A1C was elevated to 13.1%.  Husband states that her blood sugars were doing well until she restarted taking Prednisone. She was consistent in checking blood sugars and taking insulin.  He did note that she was very thirsty prior to admission.   We discussed that this could be due to high blood sugars.  Patient see's Dr. Maurice SmallElaine Griffin with [facility]. Note that A1C in 2016 was 10.1%.  Explained need for improved glycemic control.  Will continue to follow.   -Consider reducing Novolog correction to sensitive due to reduced Creatinine Clearance.    Thanks, Beryl MeagerJenny Sherene Plancarte, RN, BC-ADM Inpatient Diabetes Coordinator Pager [phone] (8a-5p)

## 2017-06-28 NOTE — Progress Notes (Signed)
E-Link called for pt poor mentation, lethargy, bilateral wheezing and dyspnea. Spoke with Dr Craige CottaSood and asked for blood gas or neb treatment for respiratory status. Md states he will have NP come and take a look at pt. Will continue to monitor.

## 2017-06-28 NOTE — Progress Notes (Signed)
CRITICAL VALUE ALERT  Critical Value:  Platelet 27  Date & Time Notied:  06/28/2017 [id number]  Provider Notified: Tyson AliasFeinstein MD

## 2017-06-29 ENCOUNTER — Inpatient Hospital Stay (HOSPITAL_COMMUNITY): Payer: 59

## 2017-06-29 DIAGNOSIS — E875 Hyperkalemia: Secondary | ICD-10-CM

## 2017-06-29 LAB — BASIC METABOLIC PANEL
Anion gap: 10 (ref 5–15)
Anion gap: 11 (ref 5–15)
BUN: 82 mg/dL — AB (ref 6–20)
BUN: 89 mg/dL — ABNORMAL HIGH (ref 6–20)
CHLORIDE: 108 mmol/L (ref 101–111)
CHLORIDE: 110 mmol/L (ref 101–111)
CO2: 14 mmol/L — AB (ref 22–32)
CO2: 16 mmol/L — ABNORMAL LOW (ref 22–32)
CREATININE: 3.59 mg/dL — AB (ref 0.44–1.00)
Calcium: 6.5 mg/dL — ABNORMAL LOW (ref 8.9–10.3)
Calcium: 6.5 mg/dL — ABNORMAL LOW (ref 8.9–10.3)
Creatinine, Ser: 3.74 mg/dL — ABNORMAL HIGH (ref 0.44–1.00)
GFR calc Af Amer: 15 mL/min — ABNORMAL LOW (ref >60)
GFR calc non Af Amer: 13 mL/min — ABNORMAL LOW (ref >60)
GFR calc non Af Amer: 12 mL/min — ABNORMAL LOW (ref >60)
GFR, EST AFRICAN AMERICAN: 14 mL/min — AB (ref >60)
GLUCOSE: 203 mg/dL — AB (ref 65–99)
Glucose, Bld: 210 mg/dL — ABNORMAL HIGH (ref 65–99)
POTASSIUM: 6.3 mmol/L — AB (ref 3.5–5.1)
POTASSIUM: 3.5 mmol/L (ref 3.5–5.1)
SODIUM: 132 mmol/L — AB (ref 135–145)
SODIUM: 137 mmol/L (ref 135–145)

## 2017-06-29 LAB — GLUCOSE, CAPILLARY
GLUCOSE-CAPILLARY: 182 mg/dL — AB (ref 65–99)
GLUCOSE-CAPILLARY: 241 mg/dL — AB (ref 65–99)
GLUCOSE-CAPILLARY: 204 mg/dL — AB (ref 65–99)
Glucose-Capillary: 200 mg/dL — ABNORMAL HIGH (ref 65–99)
Glucose-Capillary: 269 mg/dL — ABNORMAL HIGH (ref 65–99)

## 2017-06-29 LAB — CULTURE, BLOOD (ROUTINE X 2)
Special Requests: ADEQUATE
Special Requests: ADEQUATE

## 2017-06-29 LAB — CBC
HCT: 33.3 % — ABNORMAL LOW (ref 36.0–46.0)
Hemoglobin: 11.8 g/dL — ABNORMAL LOW (ref 12.0–15.0)
MCH: 30.5 pg (ref 26.0–34.0)
MCHC: 35.4 g/dL (ref 30.0–36.0)
MCV: 86 fL (ref 78.0–100.0)
Platelets: 31 10*3/uL — ABNORMAL LOW (ref 150–400)
RBC: 3.87 MIL/uL (ref 3.87–5.11)
RDW: 18.1 % — ABNORMAL HIGH (ref 11.5–15.5)
WBC: 30.7 10*3/uL — AB (ref 4.0–10.5)

## 2017-06-29 LAB — URINE CULTURE

## 2017-06-29 LAB — PROCALCITONIN: PROCALCITONIN: 34.85 ng/mL

## 2017-06-29 LAB — T3: T3 TOTAL: 35 ng/dL — AB (ref 71–180)

## 2017-06-29 LAB — PHOSPHORUS: Phosphorus: 3.6 mg/dL (ref 2.5–4.6)

## 2017-06-29 LAB — MAGNESIUM: Magnesium: 2.6 mg/dL — ABNORMAL HIGH (ref 1.7–2.4)

## 2017-06-29 LAB — ALBUMIN: Albumin: 1.3 g/dL — ABNORMAL LOW (ref 3.5–5.0)

## 2017-06-29 MED ORDER — INSULIN ASPART 100 UNIT/ML ~~LOC~~ SOLN
8.0000 [IU] | Freq: Once | SUBCUTANEOUS | Status: AC
  Administered 2017-06-29: 8 [IU] via INTRAVENOUS

## 2017-06-29 MED ORDER — DEXTROSE 50 % IV SOLN
25.0000 mL | Freq: Once | INTRAVENOUS | Status: AC
Start: 2017-06-29 — End: 2017-06-29
  Administered 2017-06-29: 25 mL via INTRAVENOUS
  Filled 2017-06-29: qty 50

## 2017-06-29 MED ORDER — IPRATROPIUM-ALBUTEROL 0.5-2.5 (3) MG/3ML IN SOLN
3.0000 mL | Freq: Once | RESPIRATORY_TRACT | Status: DC
  Filled 2017-06-29: qty 3

## 2017-06-29 MED ORDER — STERILE WATER FOR INJECTION IV SOLN
INTRAVENOUS | Status: DC
  Administered 2017-06-29 – 2017-07-01 (×3): via INTRAVENOUS
  Filled 2017-06-29 (×4): qty 850

## 2017-06-29 MED ORDER — INSULIN ASPART 100 UNIT/ML ~~LOC~~ SOLN: 8.0000 [IU] | Freq: Once | SUBCUTANEOUS | Status: DC

## 2017-06-29 MED ORDER — SODIUM POLYSTYRENE SULFONATE 15 GM/60ML PO SUSP
30.0000 g | Freq: Once | ORAL | Status: AC
Start: 2017-06-29 — End: 2017-06-29
  Administered 2017-06-29: 30 g via ORAL
  Filled 2017-06-29: qty 120

## 2017-06-29 MED ORDER — SODIUM CHLORIDE 0.9 % IV SOLN
1.0000 g | Freq: Once | INTRAVENOUS | Status: AC
  Administered 2017-06-29: 1 g via INTRAVENOUS
  Filled 2017-06-29: qty 10

## 2017-06-29 MED ORDER — DEXTROSE 5 % IV SOLN
1.0000 g | INTRAVENOUS | Status: DC
  Administered 2017-06-29: 1 g via INTRAVENOUS
  Filled 2017-06-29 (×2): qty 10

## 2017-06-29 NOTE — Evaluation (Signed)
Clinical/Bedside Swallow Evaluation Patient Details  Name: Dana Thornton MRN: [id number] Date of Birth: Aug 10, 1956  Today's Date: 06/29/2017 Time: SLP Start Time (ACUTE ONLY): 1221 SLP Stop Time (ACUTE ONLY): 1230 SLP Time Calculation (min) (ACUTE ONLY): 9 min  Past Medical History:  Past Medical History:  Diagnosis Date  . Hypercholesterolemia   . Hypertension   . Kidney stones   . Type II diabetes mellitus (HCC)    Past Surgical History:  Past Surgical History:  Procedure Laterality Date  . CESAREAN SECTION  1999  . DILATION AND CURETTAGE OF UTERUS     "I lost a baby"  . TUBAL LIGATION  1999   HPI:  61yo female with hx autoimmune hepatitis, HTN, DM presented 10/17 with weakness, difficulty speaking, SOB. Initial triage concern was for CVA but in ER found to be hypotensive, tachycardic without any focal deficits. Initial lactate >6, u/a showed UTI, CT head with no acute findings, CT abd concerning for pyelo. Admitted with acute pyelonephritis and septic shock.   Assessment / Plan / Recommendation Clinical Impression  Patient presents with acute dysphagia which appears to be cognitively-based in nature. She is alert, responding verbally to SLP though with noted slow processing, reduced sustained attention requiring repetition/refocusing. Follows basic commands for oral motor examination with cues, overall unremarkable, though her speech is mildly dysarthric. Swallows thin liquids via cup and straw with no overt signs of aspiration despite repeated challenging. Mild oral holding x1 due to reduced attention, however airway protection appears complete. With pureed solids, pt requires assistance to self-feed, consistent cues for attention ~ every 30 seconds. Upon inspection of oral cavity, pt holding pureed solids on her tongue, requires verbal cues to swallow. At this time recommend she remain on liquids (full liquids), meds whole with liquid. Will follow for advancement of solids as  her mentation improves.  SLP Visit Diagnosis: Dysphagia, unspecified (R13.10)    Aspiration Risk  Moderate aspiration risk    Diet Recommendation Thin liquid   Liquid Administration via: Cup;Straw Medication Administration: Whole meds with liquid Supervision: Full supervision/cueing for compensatory strategies;Staff to assist with self feeding Compensations: Minimize environmental distractions;Slow rate;Small sips/bites Postural Changes: Seated upright at 90 degrees    Other  Recommendations Oral Care Recommendations: Oral care BID   Follow up Recommendations Other (comment) (TBD)      Frequency and Duration min 2x/week  2 weeks       Prognosis Prognosis for Safe Diet Advancement: Good Barriers to Reach Goals: Cognitive deficits      Swallow Study   General Date of Onset: 06/29/17 HPI: 61yo female with hx autoimmune hepatitis, HTN, DM presented 10/17 with weakness, difficulty speaking, SOB. Initial triage concern was for CVA but in ER found to be hypotensive, tachycardic without any focal deficits. Initial lactate >6, u/a showed UTI, CT head with no acute findings, CT abd concerning for pyelo. Admitted with acute pyelonephritis and septic shock. Type of Study: Bedside Swallow Evaluation Previous Swallow Assessment: none in chart Diet Prior to this Study: Thin liquids Temperature Spikes Noted: No Respiratory Status: Nasal cannula History of Recent Intubation: No Behavior/Cognition: Alert;Requires cueing;Distractible Oral Cavity Assessment: Within Functional Limits Oral Care Completed by SLP: No Oral Cavity - Dentition: Adequate natural dentition Vision: Functional for self-feeding Self-Feeding Abilities: Able to feed self;Needs assist;Needs set up Patient Positioning: Upright in bed Baseline Vocal Quality: Normal Volitional Cough: Strong Volitional Swallow: Able to elicit    Oral/Motor/Sensory Function Overall Oral Motor/Sensory Function: Within functional limits    Ice  Chips Ice chips: Within functional limits Presentation: Spoon   Thin Liquid Thin Liquid: Within functional limits Presentation: Self Fed;Cup;Straw    Nectar Thick Nectar Thick Liquid: Not tested   Honey Thick Honey Thick Liquid: Not tested   Puree Puree: Impaired Presentation: Spoon;Self Fed Oral Phase Impairments: Poor awareness of bolus;Reduced lingual movement/coordination Oral Phase Functional Implications: Oral holding;Oral residue Pharyngeal Phase Impairments: Throat Clearing - Immediate   Solid   GO   Solid: Not tested       Dana BatonMary Beth Nyx Keady, Dana Thornton, Dana Thornton Speech-Language Pathologist [phone]  Dana Thornton 06/29/2017,1:20 PM

## 2017-06-29 NOTE — Consult Note (Signed)
Reason for Consult: AKI  Referring Physician: Dr. Ivar Drape Dana Thornton is an 61 y.o. female with PMH autoimmune hepatitis, HTN, and uncontrolled T2 DM. HPI: Admitted 10/18 for acute pyelonephritis and septic shock. Presented with difficulty speaking, weakness and SOB. Initial lactate >6, hypotensive, and tachycardic and CT abdomen was concerning for pyelo. She continues to have lower back pain, malaise, and difficulty breathing today. She reports no changes in her urine output prior to this hospitalization.   She reports no known history of kidney dysfunction however chart review shows intermittent drops in kidney function dating back to 2010. The last creatinine was 0.98 04/2015. In July of this year she was hospitalized with DKA and suffered AKI (initial crt 2.78) which was thought to be secondary to hypovolemia. Her renal function improved back to normal with aggressive hydration at that time. Earlier in July she had been hospitalized for dehydration and suffered AKI ( initial crt 2.37) and crt improved to 1.26 at discharge.    On this admission, CT abdomen 10/18 was without hydronephrosis but showed interpolar nephrolithiasis in the right kidney and a foci with fluid attenuation within the right kidney concerning for possible cyst. Urinalysis showed large hemoglobin, 1+ protein.  She was prescribed naproxen 500 mg from the urgent care and took one of these and one ibuprofen but reports no other NSAIDs. She did receive two doses of vancomycin 1000 mg on 10/18. She is taking any ACE or ARB, she has not had any recent contrast study. Labs at this time are remarkable for Na 132, K 6.3, bicarb 14, BUN 82, Crt 3.59, phos 3.6. Urine sodium < 10 and urine osmolality 304. Urinalysis with too numerous to count RBCs and 1+ protein and crystals but no cast.  She is net positive 8 liters since admission and had 0.8 L urine output in the past 24 hours. Shes received 10.5 total liters of IV fluids since admission.    Trend in Creatinine: Creatinine, Ser  Date/Time Value Ref Range Status  06/29/2017 06:27 AM 3.59 (H) 0.44 - 1.00 mg/dL Final  16/06/9603 54:09 PM 3.48 (H) 0.44 - 1.00 mg/dL Final  81/19/1478 29:56 AM 3.49 (H) 0.44 - 1.00 mg/dL Final  21/30/8657 84:69 AM 3.15 (H) 0.44 - 1.00 mg/dL Final  62/95/2841 32:44 PM 2.60 (H) 0.44 - 1.00 mg/dL Final  09/12/7251 66:44 PM 2.65 (H) 0.44 - 1.00 mg/dL Final  03/47/4259 56:38 AM 0.98 0.44 - 1.00 mg/dL Final  75/64/3329 51:88 AM 1.10 (H) 0.44 - 1.00 mg/dL Final  41/66/0630 16:01 AM 1.23 (H) 0.44 - 1.00 mg/dL Final  09/32/3557 32:20 PM 1.40 (H) 0.44 - 1.00 mg/dL Final  25/42/7062 37:62 AM 1.51 (H) 0.44 - 1.00 mg/dL Final  83/15/1761 60:73 AM 1.58 (H) 0.44 - 1.00 mg/dL Final  71/02/2693 85:46 PM 1.77 (H) 0.44 - 1.00 mg/dL Final    Comment:    DELTA CHECK NOTED  04/09/2015 04:54 PM 2.78 (H) 0.44 - 1.00 mg/dL Final  27/11/5007 38:18 AM 1.26 (H) 0.44 - 1.00 mg/dL Final  29/93/7169 67:89 AM 1.41 (H) 0.44 - 1.00 mg/dL Final  38/06/1750 02:58 AM 1.86 (H) 0.44 - 1.00 mg/dL Final  52/77/8242 35:36 PM 1.95 (H) 0.44 - 1.00 mg/dL Final  14/43/1540 08:67 PM 1.91 (H) 0.44 - 1.00 mg/dL Final  61/95/0932 67:12 AM 2.26 (H) 0.44 - 1.00 mg/dL Final  45/80/9983 38:25 PM 2.37 (H) 0.44 - 1.00 mg/dL Final  05/39/7673 41:93 AM 0.86 0.50 - 1.10 mg/dL Final  79/10/4095 35:32 PM  0.93 0.50 - 1.10 mg/dL Final  16/06/9603 54:09 AM 0.89 0.50 - 1.10 mg/dL Final  81/19/1478 29:56 PM 0.92 0.50 - 1.10 mg/dL Final  21/30/8657 84:69 AM 0.97 0.50 - 1.10 mg/dL Final  62/95/2841 32:44 PM 1.20 (H) 0.50 - 1.10 mg/dL Final  09/12/7251 66:44 AM 1.42 (H) 0.50 - 1.10 mg/dL Final  03/47/4259 56:38 PM 1.66 (H) 0.50 - 1.10 mg/dL Final  75/64/3329 51:88 AM 1.91 (H) 0.50 - 1.10 mg/dL Final  41/66/0630 16:01 AM 2.01 (H) 0.50 - 1.10 mg/dL Final  09/32/3557 32:20 PM 2.18 (H) 0.50 - 1.10 mg/dL Final  25/42/7062 37:62 PM 2.41 (H) 0.50 - 1.10 mg/dL Final  83/15/1761 60:73 AM 2.52 (H) 0.50 - 1.10  mg/dL Final  71/02/2693 85:46 AM 2.84 (H) 0.50 - 1.10 mg/dL Final  27/11/5007 38:18 AM 3.20 (H) 0.50 - 1.10 mg/dL Final  29/93/7169 67:89 PM 3.59 (H) 0.50 - 1.10 mg/dL Final  38/06/1750 02:58 AM 1.11 0.4 - 1.2 mg/dL Final  52/77/8242 35:36 AM 1.19 0.4 - 1.2 mg/dL Final  14/43/1540 08:67 AM 1.14 0.4 - 1.2 mg/dL Final  61/95/0932 67:12 AM 1.03 0.4 - 1.2 mg/dL Final  45/80/9983 38:25 AM 0.91 0.4 - 1.2 mg/dL Final  05/39/7673 41:93 AM 0.90 0.4 - 1.2 mg/dL Final  79/10/4095 35:32 PM 0.95 0.4 - 1.2 mg/dL Final    PMH:   Past Medical History:  Diagnosis Date  . Hypercholesterolemia   . Hypertension   . Kidney stones   . Type II diabetes mellitus (HCC)     PSH:   Past Surgical History:  Procedure Laterality Date  . CESAREAN SECTION  1999  . DILATION AND CURETTAGE OF UTERUS     "I lost a baby"  . TUBAL LIGATION  1999    Allergies:  Allergies  Allergen Reactions  . Codeine Nausea And Vomiting    Medications:   Prior to Admission medications   Medication Sig Start Date End Date Taking? Authorizing Provider  aspirin 325 MG tablet Take 325 mg by mouth daily.   Yes [provider]  carvedilol (COREG) 6.25 MG tablet Take 6.25 mg by mouth 2 (two) times daily with a meal.   Yes [provider]  cyclobenzaprine (FLEXERIL) 10 MG tablet Take 10 mg by mouth 3 (three) times daily as needed for muscle spasms.   Yes [provider]  insulin aspart (NOVOLOG) 100 UNIT/ML injection Before each meal 3 times a day, 120-150 - 2 units, 151-200 - 4 units, 201-250 - 8 units, 251-300 - 10 units,  301 or above 10 units and call your MD 12/10/11  Yes Leroy Sea, MD  insulin glargine (LANTUS) 100 UNIT/ML injection Take 30 Units in the morning and 10 units at Night. Patient taking differently: Inject 10-30 Units into the skin See admin instructions. Take 30 Units in the morning and 10 units at Night. 04/13/15  Yes Osvaldo Shipper, MD  levothyroxine (SYNTHROID, LEVOTHROID) 150  MCG tablet Take 1 tablet (150 mcg total) by mouth daily before breakfast. 04/13/15  Yes Osvaldo Shipper, MD  naproxen (NAPROSYN) 500 MG tablet Take 500 mg by mouth 2 (two) times daily with a meal.   Yes [provider]  predniSONE (DELTASONE) 20 MG tablet Take 2 tablets (40 mg total) by mouth daily with breakfast. Patient not taking: Reported on 06/27/2017 04/13/15   Osvaldo Shipper, MD    Inpatient medications: . heparin  5,000 Units Subcutaneous Q8H  . insulin aspart  0-20 Units Subcutaneous Q4H  . insulin  glargine  30 Units Subcutaneous QHS  . ipratropium-albuterol  3 mL Nebulization Once  . levothyroxine  150 mcg Oral QAC breakfast  . mouth rinse  15 mL Mouth Rinse BID    Discontinued Meds:   Medications Discontinued During This Encounter  Medication Reason  . mercaptopurine (PURINETHOL) 50 MG tablet Completed Course  . ondansetron (ZOFRAN-ODT) 4 MG disintegrating tablet No longer needed (for PRN medications)  . fluconazole (DIFLUCAN) 100 MG tablet   . nystatin (MYCOSTATIN) 100000 UNIT/ML suspension   . vancomycin (VANCOCIN) IVPB 1000 mg/200 mL premix   . predniSONE (DELTASONE) tablet 10 mg   . piperacillin-tazobactam (ZOSYN) IVPB 3.375 g   . fentaNYL (SUBLIMAZE) injection 25-50 mcg   . hydrocortisone sodium succinate (SOLU-CORTEF) 100 MG injection 50 mg   . phenylephrine (NEO-SYNEPHRINE) 10 mg in sodium chloride 0.9 % 250 mL (0.04 mg/mL) infusion   . 0.9 %  sodium chloride infusion   . insulin aspart (novoLOG) injection 8 Units   . piperacillin-tazobactam (ZOSYN) IVPB 2.25 g     Social History:  reports that she has never smoked. She has never used smokeless tobacco. She reports that she does not drink alcohol or use drugs.  Family History:   Family History  Problem Relation Age of Onset  . Kidney failure Father   . Hypertension Father     Pertinent items are noted in HPI. Weight change: 8 lb 2.5 oz (3.7 kg)  Intake/Output Summary (Last 24 hours) at 06/29/17  1736 Last data filed at 06/29/17 1700  Gross per 24 hour  Intake             2525 ml  Output              980 ml  Net             1545 ml   BP 127/77   Pulse 89   Temp (!) 97.5 F (36.4 C) (Oral)   Resp (!) 23   Ht 5\' 2"  (1.575 m)   Wt 246 lb 7.6 oz (111.8 kg)   SpO2 99%   BMI 45.08 kg/m  Vitals:   06/29/17 1500 06/29/17 1600 06/29/17 1624 06/29/17 1700  BP: 101/72 97/65  127/77  Pulse: 89   89  Resp: 19 14  (!) 23  Temp:   (!) 97.5 F (36.4 C)   TempSrc:   Oral   SpO2: 100%   99%  Weight:      Height:          General: AAOx3, no acute distress HEENT: moist mucous membranes, scleral icterus  Cardiac: RRR, no murmur, no JVD  Pulmonary: expiratory wheezing, distant lungs sounds  Abdomen: soft, non tender, non distended, no obvious hepatomegaly  Extremities: 1+ bilateral non pitting edema  Foley drainage is grossly pink   Labs: Basic Metabolic Panel:  Recent Labs Lab 06/26/17 2156 06/26/17 2215 06/27/17 1003 06/28/17 0425 06/28/17 1439 06/29/17 0627  NA 130* 135 130* 136 134* 132*  K 3.8 4.0 4.3 4.0 4.3 6.3*  CL 97* 97* 103 106 109 108  CO2 21*  --  13* 17* 15* 14*  GLUCOSE 364* 364* 271* 102* 173* 203*  BUN 48* 45* 48* 60* 63* 82*  CREATININE 2.65* 2.60* 3.15* 3.49* 3.48* 3.59*  ALBUMIN 2.0*  --   --   --   --   --   CALCIUM 8.1*  --  7.0* 7.1* 6.7* 6.5*  PHOS  --   --   --  2.2*  --  3.6   Liver Function Tests:  Recent Labs Lab 06/26/17 2156  AST 44*  ALT 49  ALKPHOS 279*  BILITOT 3.6*  PROT 8.1  ALBUMIN 2.0*   No results for input(s): LIPASE, AMYLASE in the last 168 hours.  Recent Labs Lab 06/27/17 0357  AMMONIA 37*   CBC:  Recent Labs Lab 06/26/17 2156 06/26/17 2215 06/28/17 0425 06/29/17 0627  WBC 13.9*  --  21.7* 30.7*  NEUTROABS 12.6*  --   --   --   HGB 12.2 12.9 10.9* 11.8*  HCT 34.4* 38.0 31.0* 33.3*  MCV 86.0  --  85.4 86.0  PLT 55*  --  27* 31*   PT/INR: @LABRCNTIP (inr:5) Cardiac Enzymes: )  Recent Labs Lab  06/27/17 0357 06/27/17 1003 06/27/17 1548 06/27/17 2101  CKTOTAL 16*  --   --   --   TROPONINI  --  0.04* 0.05* 0.07*   CBG:  Recent Labs Lab 06/28/17 1134 06/29/17 0429 06/29/17 0824 06/29/17 1212 06/29/17 1632  GLUCAP 138* 182* 200* 241* 269*    Iron Studies: No results for input(s): IRON, TIBC, TRANSFERRIN, FERRITIN in the last 168 hours.  Xrays/Other Studies: Dg Chest 1 View  Result Date: 06/29/2017 CLINICAL DATA:  Dyspnea EXAM: CHEST 1 VIEW COMPARISON:  06/28/2017 FINDINGS: Cardiac shadow remains enlarged. Persistent bibasilar atelectatic changes are noted. Mild vascular congestion is seen new from the prior exam. No acute bony abnormality is noted. IMPRESSION: Stable bibasilar atelectasis. Mild increase in vascular congestion when compare with the prior exam. Electronically Signed   By: Alcide Clever M.D.   On: 06/29/2017 06:59   Dg Chest Port 1 View  Result Date: 06/28/2017 CLINICAL DATA:  Shortness of breath. EXAM: PORTABLE CHEST 1 VIEW COMPARISON:  06/27/2017. FINDINGS: Stable cardiomegaly. Bibasilar atelectasis again noted. No interim change. No pleural effusion or pneumothorax . IMPRESSION: 1. Stable cardiomegaly. 2. Bibasilar atelectasis again noted without interim change. Electronically Signed   By: Maisie Fus  Register   On: 06/28/2017 06:51   Assessment/Plan: 1. AKI - Etiology is not completely clear at this time, may be related to bilateral pyelonephritis, decreased perfusion and hypotension. Hepatorenal syndrome is possible but less likely. No hydronephrosis on CT so this is not postrenal. She did receive vancomycin.  - repeat urine Na  - continue IV fluid repletion  - avoid nephrotoxic drugs.  2. E coli pyelonephritis and bacteremia - on zosyn, awaiting sensitivities  3. Hypernatremia 2/2 dehydration - improving  4. Hyperkalemia - managing with kayexalate, calcium, and insulin + D50 5. Lactic acidosis - managing with bicarbonate gtt  6. Hyponatremia most likely  2/2 volume depletion, repeating urine Na   7. Septic shock - Echoli bacteremia secondary to echoli pyelonephritis  8. Thrombocytopenia possibly secondary to sepsis, hepatic disease- per primary  9. HTN - BP low normal now  10. Autoimmune hepatitis - on home dose of prednisone  11. Uncontrolled type 2 diabetes - per primary   Eulah Pont 06/29/2017, 5:36 PM   Pt seen, examined and agree w A/P as above. Renal failure in pt with autoimmune hepatitis.  Looks sick, jaundiced, Dorthula Rue and INR 1.6.  Has urosepsis with +blood/ urine cx's and CT showing hypoattenuation of a large part of the R kidney.  This could be cysts or could be areas of active tissue infection with edema.  Bilat pyelo if severe enough could cause AKI.  She had WBC casts in urine sediment and also sheets of WBC's and RBC's but no RBC  casts to suggest GN.  Plan is supportive care, treat infection, agree with IVF's at 50 cc/hr for now, repeat UNa, will follow.  Vinson Moselleob Ashe Graybeal MD [facility] pager [phone]   06/29/2017, 5:54 PM

## 2017-06-29 NOTE — Progress Notes (Signed)
Name: Dana Thornton MRN: [id number] DOB: 07-21-1956    ADMISSION DATE:  06/27/2017 CONSULTATION DATE:  10/18   REFERRING MD :  EDP  CHIEF COMPLAINT:  UTI   BRIEF PATIENT DESCRIPTION: 61yo female with hx autoimmune hepatitis, HTN, DM presented 10/17 with weakness, difficulty speaking, SOB.   Initial triage concern was for CVA but in ER found to be hypotensive, tachycardic without any focal deficits.  Initial lactate >6, u/a showed UTI, CT abd concerning for pyelo, AKI with Scr 2.65.   BP improved but lactate did not clear while in ER so PCCM consulted for ?ICU needs.    HISTORY OF PRESENT ILLNESS:  61yo female with hx autoimmune hepatitis, HTN, DM presented 10/17 with weakness, difficulty speaking, SOB.   Initial triage concern was for CVA but in ER found to be hypotensive, tachycardic without any focal deficits.  Initial lactate >6, u/a showed UTI, CT abd concerning for pyelo.   BP improved but lactate did not clear while in ER so PCCM consulted for ?ICU needs.  Currently feeling a bit better although still c/o lower back pain, dyspnea, general malaise. Denies chest pain, headache, hemoptysis, cough, fever, neck pain/stiffness, n/v/d, myalgias.   Recently dropped to 10mg /day of prednisone for autoimmune hepatitis.  Had her flu shot about 1 week prior to onset symptoms.    SUBJECTIVE:   Overnight patient noted to have wheezing and worsened tachypnea. Blood gas was collected, Duoneb was ordered. She continues to be alert and oriented x4 this AM. Decreased IVF. CXR ordered.  VITAL SIGNS: Temp:  [97.4 F (36.3 C)-97.9 F (36.6 C)] 97.9 F (36.6 C) (10/20 0427) Pulse Rate:  [80-111] 88 (10/20 0500) Resp:  [16-33] 23 (10/20 0500) BP: (84-134)/(53-102) 115/73 (10/20 0500) SpO2:  [94 %-99 %] 98 % (10/20 0500) Weight:  [246 lb 7.6 oz (111.8 kg)] (P) 246 lb 7.6 oz (111.8 kg) (10/20 0500)  PHYSICAL EXAMINATION: General:  Rests in bed, NAD, appropriate Neuro:  AAOx3, no focal deficits,  speech somewhat slurred c/w ?dry mouth Cardiovascular:  RRR, no m/r/g Lungs:  Tachypnic, +wheezes in bilateral anterior lung fields Abdomen:  Soft and nontender, nondistended Musculoskeletal:  No LE edema   Recent Labs Lab 06/27/17 1003 06/28/17 0425 06/28/17 1439  NA 130* 136 134*  K 4.3 4.0 4.3  CL 103 106 109  CO2 13* 17* 15*  BUN 48* 60* 63*  CREATININE 3.15* 3.49* 3.48*  GLUCOSE 271* 102* 173*    Recent Labs Lab 06/26/17 2156 06/26/17 2215 06/28/17 0425  HGB 12.2 12.9 10.9*  HCT 34.4* 38.0 31.0*  WBC 13.9*  --  21.7*  PLT 55*  --  27*   Dg Chest Port 1 View  Result Date: 06/28/2017 CLINICAL DATA:  Shortness of breath. EXAM: PORTABLE CHEST 1 VIEW COMPARISON:  06/27/2017. FINDINGS: Stable cardiomegaly. Bibasilar atelectasis again noted. No interim change. No pleural effusion or pneumothorax . IMPRESSION: 1. Stable cardiomegaly. 2. Bibasilar atelectasis again noted without interim change. Electronically Signed   By: Maisie Fus  Register   On: 06/28/2017 06:51   SIGNIFICANT EVENTS   STUDIES:  CT head 10/17>>> neg acute  CT abd/pelvis 10/18>>>1. Mild right kidney perinephric stranding may represent underlying infection or inflammation of the renal collecting system. No hydronephrosis. 2. Right kidney interpolar punctate nonobstructive nephrolithiasis. 3. Cholelithiasis. 4. Aortic atherosclerosis. 5. Sigmoid diverticulosis.   ANTIBIOTICS Vancomycin 10/18 >> 10/19 Zosyn 10/18 >>  ASSESSMENT / PLAN: INFECTIOUS Sepsis - r/t UTI with pyelo +/- mild CAP. Initial lactate >6, improving.  Hypotensive initially, did improve some with fluids.  UTI with pyelo  - BCID with E Colil PLAN -  S/p 30cc/kg ~ 3.2L   continue Zosyn Await culture susceptibilities Continue stress dose steroids  Hold home coreg   PULMONARY Dyspnea - could be r/t SIRS/sepsis syndrome but does have some bibasilar atx/ infiltrate (?CAP) +/- vascular congestion. No cough or sputum production.  ABG  7.354/28.1/89.1/15.2 PLAN -  Continue abx as above Will order duoneb x1 for new finding of wheezing  CARDIOLOGY - dyspnea; initial concern for new heart failure in exacerbation, however Echo LVEF 60-65%, wall motion normal BNP 82.4 PLAN: Monitor fluid status, decrease IVF given worsened respiratory status  RENAL AKI with metabolic acidosis - creatinine and BUN worsening 10/19. Initially thought to be prerenal in the setting of septic shock, however with worsening ARF, urine osm, urine Na sent. Lactate peaked at 6.02 and improving PLAN -  IVNS at 125 ml/hr >> decrease to 50 ml/hr given worsening respiratory status overnight Follow up urine studies  ENDOCRINE DM with significant hyperglycemia - urine neg for ketones.Glucose controlled at 90-138 overnight. HbA1c elevated at 13.1 this admission On chronic prednisone for autoimmune hepatitis PLAN -  Continue resistant sliding scale Continue home lantus 30u QHS Continue stress dose steroids (started 10/19)  Hypothyroid  TSH markedly elevated at 18.576 - on synthroid 150 mcg daily but unclear if she has been taking this. T4 mildlly low at 0.55 PLAN -  T3 pending Continue home synthroid, may need to be increased  GI Autoimmune hepatitis - mildly elevated LFT's, high bili.  PLAN -  Continue prednisone 10mg /day  Monitor LFT's   Howard PouchLauren Virlan Kempker, MD PGY-2 [facility]

## 2017-06-30 ENCOUNTER — Inpatient Hospital Stay (HOSPITAL_COMMUNITY): Payer: 59

## 2017-06-30 LAB — GLUCOSE, CAPILLARY
GLUCOSE-CAPILLARY: 195 mg/dL — AB (ref 65–99)
GLUCOSE-CAPILLARY: 205 mg/dL — AB (ref 65–99)
GLUCOSE-CAPILLARY: 158 mg/dL — AB (ref 65–99)
GLUCOSE-CAPILLARY: 181 mg/dL — AB (ref 65–99)
GLUCOSE-CAPILLARY: 198 mg/dL — AB (ref 65–99)
Glucose-Capillary: 114 mg/dL — ABNORMAL HIGH (ref 65–99)
Glucose-Capillary: 138 mg/dL — ABNORMAL HIGH (ref 65–99)
Glucose-Capillary: 144 mg/dL — ABNORMAL HIGH (ref 65–99)
Glucose-Capillary: 198 mg/dL — ABNORMAL HIGH (ref 65–99)

## 2017-06-30 LAB — BLOOD GAS, ARTERIAL
Acid-base deficit: 3.7 mmol/L — ABNORMAL HIGH (ref 0.0–2.0)
BICARBONATE: 20.3 mmol/L (ref 20.0–28.0)
Drawn by: 295031
FIO2: 21
O2 SAT: 89.3 %
PATIENT TEMPERATURE: 98.6
pCO2 arterial: 33.4 mmHg (ref 32.0–48.0)
pH, Arterial: 7.401 (ref 7.350–7.450)
pO2, Arterial: 58.3 mmHg — ABNORMAL LOW (ref 83.0–108.0)

## 2017-06-30 LAB — HEPATIC FUNCTION PANEL
ALK PHOS: 260 U/L — AB (ref 38–126)
ALT: 49 U/L (ref 14–54)
AST: 104 U/L — ABNORMAL HIGH (ref 15–41)
Albumin: 1.2 g/dL — ABNORMAL LOW (ref 3.5–5.0)
BILIRUBIN DIRECT: 1.5 mg/dL — AB (ref 0.1–0.5)
BILIRUBIN INDIRECT: 2.4 mg/dL — AB (ref 0.3–0.9)
TOTAL PROTEIN: 6.8 g/dL (ref 6.5–8.1)
Total Bilirubin: 3.9 mg/dL — ABNORMAL HIGH (ref 0.3–1.2)

## 2017-06-30 LAB — BASIC METABOLIC PANEL
Anion gap: 12 (ref 5–15)
BUN: 92 mg/dL — ABNORMAL HIGH (ref 6–20)
CHLORIDE: 110 mmol/L (ref 101–111)
CO2: 19 mmol/L — AB (ref 22–32)
CREATININE: 3.73 mg/dL — AB (ref 0.44–1.00)
Calcium: 7 mg/dL — ABNORMAL LOW (ref 8.9–10.3)
GFR calc non Af Amer: 12 mL/min — ABNORMAL LOW (ref >60)
GFR, EST AFRICAN AMERICAN: 14 mL/min — AB (ref >60)
Glucose, Bld: 109 mg/dL — ABNORMAL HIGH (ref 65–99)
Potassium: 3.2 mmol/L — ABNORMAL LOW (ref 3.5–5.1)
Sodium: 141 mmol/L (ref 135–145)

## 2017-06-30 LAB — VANCOMYCIN, RANDOM: Vancomycin Rm: 11

## 2017-06-30 LAB — CBC
HEMATOCRIT: 33.3 % — AB (ref 36.0–46.0)
HEMOGLOBIN: 12.3 g/dL (ref 12.0–15.0)
MCH: 30.9 pg (ref 26.0–34.0)
MCHC: 36.9 g/dL — AB (ref 30.0–36.0)
MCV: 83.7 fL (ref 78.0–100.0)
Platelets: 34 10*3/uL — ABNORMAL LOW (ref 150–400)
RBC: 3.98 MIL/uL (ref 3.87–5.11)
RDW: 17.8 % — ABNORMAL HIGH (ref 11.5–15.5)
WBC: 24.2 10*3/uL — ABNORMAL HIGH (ref 4.0–10.5)

## 2017-06-30 LAB — AMMONIA: Ammonia: 20 umol/L (ref 9–35)

## 2017-06-30 MED ORDER — ORAL CARE MOUTH RINSE: 15.0000 mL | Freq: Two times a day (BID) | OROMUCOSAL | Status: DC

## 2017-06-30 MED ORDER — CHLORHEXIDINE GLUCONATE 0.12 % MT SOLN
15.0000 mL | Freq: Two times a day (BID) | OROMUCOSAL | Status: DC
  Administered 2017-06-30 – 2017-07-02 (×5): 15 mL via OROMUCOSAL
  Filled 2017-06-30 (×3): qty 15

## 2017-06-30 MED ORDER — IPRATROPIUM-ALBUTEROL 0.5-2.5 (3) MG/3ML IN SOLN
3.0000 mL | RESPIRATORY_TRACT | Status: DC | PRN
  Administered 2017-06-30 – 2017-07-01 (×3): 3 mL via RESPIRATORY_TRACT
  Filled 2017-06-30 (×3): qty 3

## 2017-06-30 MED ORDER — DEXTROSE 5 % IV SOLN
2.0000 g | INTRAVENOUS | Status: DC
  Administered 2017-06-30 – 2017-07-01 (×2): 2 g via INTRAVENOUS
  Filled 2017-06-30 (×3): qty 2

## 2017-06-30 NOTE — Progress Notes (Addendum)
61 year old woman with autoimmune hepatitis on steroids admitted with acute pyelonephritis and septic shock. Blood cultures>>Escherichia coli  10/19 Mild respiratory distress  with bronchospasm, nebs More lethargic today , garbled speech  On exam- somnolent, follows commands,no meningeal signs, bibasal crackles, faint BL rhonchi  S1-S2 nml, 1+ bipedal edema, no focal deficits.  Labs show creatinine has plateaued at 3.7, K decreased , WBC decreasing and severe thrombocytopenia  Impression/plan Septic shock -due to Escherichia coli pyelonephritis , nonobstructive calculi , resolved Changed to ceftriaxone , total 14 ds ABx  Autoimmune hepatitis, on chronic steroids-  resume 10 mg prednisone   Acute encephalopathy - check ammonia   Bronchospasm - duonebs  AKI -creatinine has plateaued and expect to improve, nonoliguric now  Hyperkalemia-resolved  Uncontrolled diabetes-Lantus 30 units plus SSI resistant scale TSH high - adjust synthroid  Husband updated. Keep in ICU today  The patient is critically ill with multiple organ systems failure and requires high complexity decision making for assessment and support, frequent evaluation and titration of therapies, application of advanced monitoring technologies and extensive interpretation of multiple databases. Critical Care Time devoted to patient care services described in this note independent of APP time is 32 minutes.    Oretha MilchALVA,RAKESH V. MD

## 2017-06-30 NOTE — Evaluation (Signed)
Physical Therapy Evaluation Patient Details Name: Dana Thornton MRN: [id number] DOB: 08-Dec-1955 Today's Date: 06/30/2017   History of Present Illness  61 year old woman with autoimmune hepatitis on steroids admitted with acute pyelonephritis and septic shock.  Also h/o HTN, ARF, UTI and obesity.  Clinical Impression  Patient presents with decreased independence with mobility due to deficits listed in PT problem list and will benefit from skilled PT in the acute setting to allow return home with family support.  Needs frequent mobilization with staff as well to ensure will be ready for home with family support at d/c.  Will update d/c recommendations if pt does not progress as anticipated.     Follow Up Recommendations Home health PT    Equipment Recommendations  Rolling walker with 5" wheels    Recommendations for Other Services       Precautions / Restrictions Precautions Precautions: Fall      Mobility  Bed Mobility Overal bed mobility: Needs Assistance Bed Mobility: Rolling;Sidelying to Sit;Sit to Supine Rolling: Mod assist Sidelying to sit: Mod assist;+2 for physical assistance   Sit to supine: Mod assist;+2 for physical assistance   General bed mobility comments: assist for trunk and legs to sit, and to supine  Transfers Overall transfer level: Needs assistance Equipment used: 2 person hand held assist Transfers: Sit to/from Stand Sit to Stand: Mod assist;+2 physical assistance         General transfer comment: cues and assist for anterior weight shift and lifting assist  Ambulation/Gait Ambulation/Gait assistance: Mod assist;+2 physical assistance Ambulation Distance (Feet): 1 Feet Assistive device: 2 person hand held assist Gait Pattern/deviations: Step-to pattern     General Gait Details: side steps to [facility], but very short so moved bed down to get up to [facility] before returning to supine  Stairs            Wheelchair Mobility    Modified Rankin  (Stroke Patients Only)       Balance Overall balance assessment: Needs assistance   Sitting balance-Leahy Scale: Fair     Standing balance support: Bilateral upper extremity supported Standing balance-Leahy Scale: Poor                               Pertinent Vitals/Pain Pain Assessment: Faces Faces Pain Scale: Hurts little more Pain Location: generalized with movement Pain Descriptors / Indicators: Discomfort;Moaning Pain Intervention(s): Monitored during session;Repositioned    Home Living Family/patient expects to be discharged to:: Private residence Living Arrangements: Spouse/significant other Available Help at Discharge: Family;Available 24 hours/day Type of Home: House Home Access: Stairs to enter   [employer] of Steps: 1 Home Layout: One level Home Equipment: None      Prior Function Level of Independence: Independent         Comments: works for [employer]     Hand Dominance   Dominant Hand: Right    Extremity/Trunk Assessment   Upper Extremity Assessment Upper Extremity Assessment: Generalized weakness    Lower Extremity Assessment Lower Extremity Assessment: Generalized weakness       Communication   Communication: No difficulties  Cognition Arousal/Alertness: Awake/alert Behavior During Therapy: Anxious Overall Cognitive Status: Impaired/Different from baseline Area of Impairment: Following commands                       Following Commands: Follows one step commands with increased time       General Comments: slowed responses  to questions, easily agitated due to reports of fatigue      General Comments      Exercises     Assessment/Plan    PT Assessment Patient needs continued PT services  PT Problem List Decreased strength;Decreased mobility;Decreased balance;Decreased knowledge of use of DME;Decreased activity tolerance;Decreased safety awareness       PT Treatment Interventions DME  instruction;Therapeutic activities;Gait training;Therapeutic exercise;Patient/family education;Balance training;Functional mobility training;Stair training    PT Goals (Current goals can be found in the Care Plan section)  Acute Rehab PT Goals Patient Stated Goal: To go home PT Goal Formulation: With patient/family Time For Goal Achievement: 07/14/17 Potential to Achieve Goals: Good    Frequency Min 3X/week   Barriers to discharge        Co-evaluation               AM-PAC PT "6 Clicks" Daily Activity  Outcome Measure Difficulty turning over in bed (including adjusting bedclothes, sheets and blankets)?: Unable Difficulty moving from lying on back to sitting on the side of the bed? : Unable Difficulty sitting down on and standing up from a chair with arms (e.g., wheelchair, bedside commode, etc,.)?: Unable Help needed moving to and from a bed to chair (including a wheelchair)?: A Lot Help needed walking in hospital room?: A Lot Help needed climbing 3-5 steps with a railing? : Total 6 Click Score: 8    End of Session Equipment Utilized During Treatment: Gait belt Activity Tolerance: Patient limited by fatigue Patient left: in bed;with call bell/phone within reach;with family/visitor present   PT Visit Diagnosis: Muscle weakness (generalized) (M62.81);Other abnormalities of gait and mobility (R26.89);Unsteadiness on feet (R26.81)    Time: 1130-1157 PT Time Calculation (min) (ACUTE ONLY): 27 min   Charges:   PT Evaluation $PT Eval High Complexity: 1 High PT Treatments $Therapeutic Activity: 8-22 mins   PT G CodesSheran Lawless:        Cyndi Dakari Cregger, [place]PT [phone] 06/30/2017   Elray Mcgregorynthia Laquiesha Piacente 06/30/2017, 1:04 PM

## 2017-06-30 NOTE — Progress Notes (Signed)
S: No new concerns today, says that she continues to feel better.   O:BP (!) 82/46   Pulse 84   Temp 97.8 F (36.6 C) (Oral)   Resp 15   Ht 5\' 2"  (1.575 m)   Wt 249 lb 9 oz (113.2 kg)   SpO2 92%   BMI 45.65 kg/m   Intake/Output Summary (Last 24 hours) at 06/30/17 1028 Last data filed at 06/30/17 1000  Gross per 24 hour  Intake             1360 ml  Output             1450 ml  Net              -90 ml   Intake/Output: I/O last 3 completed shifts: In: 2850 [I.V.:2540; IV Piggyback:310] Out: 1920 [Urine:1920]  Intake/Output this shift:  Total I/O In: 150 [I.V.:150] Out: 180 [Urine:180] Weight change: 3 lb 1.4 oz (1.4 kg) Gen: AAOx3, NAD  CVS:RRR, trace non pitting peripheral edema  Resp: clear to auscultation over anterior lung fields, mild increased work of breathing speaking in incomplete sentences  ZOX:WRUEAbd:soft, non tender, non distended  Ext: trace bilateral non pitting edema    Recent Labs Lab 06/26/17 2156 06/26/17 2215 06/27/17 1003 06/28/17 0425 06/28/17 1439 06/29/17 0627 06/29/17 2123 06/30/17 0257  NA 130* 135 130* 136 134* 132* 137 141  K 3.8 4.0 4.3 4.0 4.3 6.3* 3.5 3.2*  CL 97* 97* 103 106 109 108 110 110  CO2 21*  --  13* 17* 15* 14* 16* 19*  GLUCOSE 364* 364* 271* 102* 173* 203* 210* 109*  BUN 48* 45* 48* 60* 63* 82* 89* 92*  CREATININE 2.65* 2.60* 3.15* 3.49* 3.48* 3.59* 3.74* 3.73*  ALBUMIN 2.0*  --   --   --   --   --  1.3*  --   CALCIUM 8.1*  --  7.0* 7.1* 6.7* 6.5* 6.5* 7.0*  PHOS  --   --   --  2.2*  --  3.6  --   --   AST 44*  --   --   --   --   --   --   --   ALT 49  --   --   --   --   --   --   --    Liver Function Tests:  Recent Labs Lab 06/26/17 2156 06/29/17 2123  AST 44*  --   ALT 49  --   ALKPHOS 279*  --   BILITOT 3.6*  --   PROT 8.1  --   ALBUMIN 2.0* 1.3*   No results for input(s): LIPASE, AMYLASE in the last 168 hours.  Recent Labs Lab 06/27/17 0357  AMMONIA 37*   CBC:  Recent Labs Lab 06/26/17 2156   06/28/17 0425 06/29/17 0627 06/30/17 0257  WBC 13.9*  --  21.7* 30.7* 24.2*  NEUTROABS 12.6*  --   --   --   --   HGB 12.2  < > 10.9* 11.8* 12.3  HCT 34.4*  < > 31.0* 33.3* 33.3*  MCV 86.0  --  85.4 86.0 83.7  PLT 55*  --  27* 31* 34*  < > = values in this interval not displayed. Cardiac Enzymes:  Recent Labs Lab 06/27/17 0357 06/27/17 1003 06/27/17 1548 06/27/17 2101  CKTOTAL 16*  --   --   --   TROPONINI  --  0.04* 0.05* 0.07*   CBG:  Recent Labs  Lab 06/29/17 1632 06/29/17 2008 06/30/17 0029 06/30/17 0400 06/30/17 0839  GLUCAP 269* 204* 138* 114* 144*    Iron Studies: No results for input(s): IRON, TIBC, TRANSFERRIN, FERRITIN in the last 72 hours. Studies/Results: Dg Chest 1 View  Result Date: 06/29/2017 CLINICAL DATA:  Dyspnea EXAM: CHEST 1 VIEW COMPARISON:  06/28/2017 FINDINGS: Cardiac shadow remains enlarged. Persistent bibasilar atelectatic changes are noted. Mild vascular congestion is seen new from the prior exam. No acute bony abnormality is noted. IMPRESSION: Stable bibasilar atelectasis. Mild increase in vascular congestion when compare with the prior exam. Electronically Signed   By: Alcide Clever M.D.   On: 06/29/2017 06:59   . heparin  5,000 Units Subcutaneous Q8H  . insulin aspart  0-20 Units Subcutaneous Q4H  . insulin glargine  30 Units Subcutaneous QHS  . ipratropium-albuterol  3 mL Nebulization Once  . levothyroxine  150 mcg Oral QAC breakfast  . mouth rinse  15 mL Mouth Rinse BID    BMET    Component Value Date/Time   NA 141 06/30/2017 0257   K 3.2 (L) 06/30/2017 0257   CL 110 06/30/2017 0257   CO2 19 (L) 06/30/2017 0257   GLUCOSE 109 (H) 06/30/2017 0257   BUN 92 (H) 06/30/2017 0257   CREATININE 3.73 (H) 06/30/2017 0257   CALCIUM 7.0 (L) 06/30/2017 0257   GFRNONAA 12 (L) 06/30/2017 0257   GFRAA 14 (L) 06/30/2017 0257   CBC    Component Value Date/Time   WBC 24.2 (H) 06/30/2017 0257   RBC 3.98 06/30/2017 0257   HGB 12.3 06/30/2017  0257   HCT 33.3 (L) 06/30/2017 0257   PLT 34 (L) 06/30/2017 0257   MCV 83.7 06/30/2017 0257   MCH 30.9 06/30/2017 0257   MCHC 36.9 (H) 06/30/2017 0257   RDW 17.8 (H) 06/30/2017 0257   LYMPHSABS 0.8 06/26/2017 2156   MONOABS 0.4 06/26/2017 2156   EOSABS 0.1 06/26/2017 2156   BASOSABS 0.0 06/26/2017 2156     Assessment/Plan:  1. AKI/CKD stage 3 (baseline Scr unknown but did have episode oa AKI in July 2016 with peak Scr of 2.37, last known Cr was 0.98 in 2016) - May be related to bilateral pyelonephritis, decreased perfusion and hypotension. Hepatorenal syndrome is possible but less likely.  Continues to have good urine output  - Follow up repeat urine Na  - follow up renal ultrasound  - follow up vancomycin level  - continue IV fluid repletion  - avoid nephrotoxic agents 2. E coli pyelonephritis and bacteremia - on rocephin, planning for 14 days  3. Hypernatremia 2/2 dehydration - improving  4. Hyperkalemia, resolved  5. Lactic acidosis - bicarb improving  6. Hyponatremia - resolved  7. Septic shock - Ecoli bacteremia secondary to ecoli pyelonephritis  8. Thrombocytopenia possibly secondary to sepsis, hepatic disease - improving  9. HTN - hypotensive now  10. Autoimmune hepatitis - on home dose of prednisone  11. Uncontrolled type 2 diabetes - per primary    I have seen and examined this patient and agree with plan and assessment in the above note with renal recommendations/intervention highlighted.  Pt presented with urosepsis related to right pyelonephritis, hypotension, low FeNa, as well as nephrotoxic agents (naproxen and vancomycin).  Non-oliguric ARF with Scr reaching plateau of 3.7.  No indication for dialysis.  Continue to follow with IVF's and abx.  Pt's father is Dana Thornton who has been a longstanding dialysis patient as well as HTN and poorly controlled DM, so she does have  risk factors for CKD Julien Nordmann Dana Istre,MD 06/30/2017 12:07 PM   Dana Cords,  MD [facility] [phone]

## 2017-07-01 DIAGNOSIS — E860 Dehydration: Secondary | ICD-10-CM

## 2017-07-01 LAB — GLUCOSE, CAPILLARY
GLUCOSE-CAPILLARY: 134 mg/dL — AB (ref 65–99)
GLUCOSE-CAPILLARY: 210 mg/dL — AB (ref 65–99)
Glucose-Capillary: 119 mg/dL — ABNORMAL HIGH (ref 65–99)
Glucose-Capillary: 143 mg/dL — ABNORMAL HIGH (ref 65–99)
Glucose-Capillary: 181 mg/dL — ABNORMAL HIGH (ref 65–99)
Glucose-Capillary: 218 mg/dL — ABNORMAL HIGH (ref 65–99)

## 2017-07-01 LAB — CBC
HCT: 27.9 % — ABNORMAL LOW (ref 36.0–46.0)
HEMOGLOBIN: 10 g/dL — AB (ref 12.0–15.0)
MCH: 30.2 pg (ref 26.0–34.0)
MCHC: 35.8 g/dL (ref 30.0–36.0)
MCV: 84.3 fL (ref 78.0–100.0)
PLATELETS: 41 10*3/uL — AB (ref 150–400)
RBC: 3.31 MIL/uL — ABNORMAL LOW (ref 3.87–5.11)
RDW: 17.9 % — ABNORMAL HIGH (ref 11.5–15.5)
WBC: 20.5 10*3/uL — ABNORMAL HIGH (ref 4.0–10.5)

## 2017-07-01 LAB — BASIC METABOLIC PANEL
Anion gap: 4 — ABNORMAL LOW (ref 5–15)
Anion gap: 8 (ref 5–15)
BUN: 87 mg/dL — AB (ref 6–20)
BUN: 84 mg/dL — AB (ref 6–20)
CHLORIDE: 107 mmol/L (ref 101–111)
CHLORIDE: 109 mmol/L (ref 101–111)
CO2: 24 mmol/L (ref 22–32)
CO2: 22 mmol/L (ref 22–32)
CREATININE: 3.41 mg/dL — AB (ref 0.44–1.00)
Calcium: 6.5 mg/dL — ABNORMAL LOW (ref 8.9–10.3)
Calcium: 6.9 mg/dL — ABNORMAL LOW (ref 8.9–10.3)
Creatinine, Ser: 3.17 mg/dL — ABNORMAL HIGH (ref 0.44–1.00)
GFR calc Af Amer: 16 mL/min — ABNORMAL LOW (ref >60)
GFR calc non Af Amer: 14 mL/min — ABNORMAL LOW (ref >60)
GFR, EST AFRICAN AMERICAN: 17 mL/min — AB (ref >60)
GFR, EST NON AFRICAN AMERICAN: 15 mL/min — AB (ref >60)
GLUCOSE: 116 mg/dL — AB (ref 65–99)
GLUCOSE: 227 mg/dL — AB (ref 65–99)
POTASSIUM: 4.3 mmol/L (ref 3.5–5.1)
Potassium: 2.8 mmol/L — ABNORMAL LOW (ref 3.5–5.1)
SODIUM: 135 mmol/L (ref 135–145)
SODIUM: 139 mmol/L (ref 135–145)

## 2017-07-01 LAB — BILIRUBIN, FRACTIONATED(TOT/DIR/INDIR)
BILIRUBIN INDIRECT: 1.1 mg/dL — AB (ref 0.3–0.9)
Bilirubin, Direct: 1.2 mg/dL — ABNORMAL HIGH (ref 0.1–0.5)
Total Bilirubin: 2.3 mg/dL — ABNORMAL HIGH (ref 0.3–1.2)

## 2017-07-01 LAB — PHOSPHORUS: Phosphorus: 4.3 mg/dL (ref 2.5–4.6)

## 2017-07-01 LAB — MAGNESIUM: MAGNESIUM: 2.6 mg/dL — AB (ref 1.7–2.4)

## 2017-07-01 MED ORDER — POTASSIUM CHLORIDE 20 MEQ/15ML (10%) PO SOLN: 40.0000 meq | Freq: Two times a day (BID) | ORAL | Status: DC

## 2017-07-01 MED ORDER — ASPIRIN 325 MG PO TABS
325.0000 mg | ORAL_TABLET | Freq: Every day | ORAL | Status: DC
  Administered 2017-07-01 – 2017-07-03 (×3): 325 mg via ORAL
  Filled 2017-07-01 (×3): qty 1

## 2017-07-01 MED ORDER — ACETAMINOPHEN 325 MG PO TABS
650.0000 mg | ORAL_TABLET | Freq: Once | ORAL | Status: AC
  Administered 2017-07-01: 650 mg via ORAL
  Filled 2017-07-01: qty 2

## 2017-07-01 MED ORDER — PREDNISONE 10 MG PO TABS
10.0000 mg | ORAL_TABLET | Freq: Every day | ORAL | Status: DC
  Administered 2017-07-02 – 2017-07-03 (×2): 10 mg via ORAL
  Filled 2017-07-01 (×2): qty 1

## 2017-07-01 MED ORDER — POTASSIUM CHLORIDE 20 MEQ/15ML (10%) PO SOLN
40.0000 meq | Freq: Two times a day (BID) | ORAL | Status: DC
  Administered 2017-07-01: 40 meq via ORAL
  Filled 2017-07-01 (×2): qty 30

## 2017-07-01 MED ORDER — IPRATROPIUM-ALBUTEROL 0.5-2.5 (3) MG/3ML IN SOLN
3.0000 mL | Freq: Four times a day (QID) | RESPIRATORY_TRACT | Status: DC
  Administered 2017-07-01 – 2017-07-02 (×2): 3 mL via RESPIRATORY_TRACT
  Filled 2017-07-01 (×2): qty 3

## 2017-07-01 MED ORDER — ALBUTEROL SULFATE (2.5 MG/3ML) 0.083% IN NEBU: 2.5000 mg | INHALATION_SOLUTION | RESPIRATORY_TRACT | Status: DC | PRN

## 2017-07-01 MED ORDER — POTASSIUM CHLORIDE CRYS ER 20 MEQ PO TBCR
40.0000 meq | EXTENDED_RELEASE_TABLET | Freq: Once | ORAL | Status: AC
  Administered 2017-07-01: 40 meq via ORAL
  Filled 2017-07-01: qty 2

## 2017-07-01 MED ORDER — INSULIN ASPART 100 UNIT/ML ~~LOC~~ SOLN
0.0000 [IU] | Freq: Three times a day (TID) | SUBCUTANEOUS | Status: DC
  Administered 2017-07-02: 4 [IU] via SUBCUTANEOUS
  Administered 2017-07-02 (×2): 11 [IU] via SUBCUTANEOUS
  Administered 2017-07-02 – 2017-07-03 (×3): 7 [IU] via SUBCUTANEOUS

## 2017-07-01 MED ORDER — IBUPROFEN 100 MG PO CHEW
600.0000 mg | CHEWABLE_TABLET | Freq: Three times a day (TID) | ORAL | Status: DC | PRN
  Filled 2017-07-01: qty 6

## 2017-07-01 NOTE — Progress Notes (Signed)
Physical Therapy Treatment Patient Details Name: Dana Thornton MRN: [id number] DOB: Apr 26, 1956 Today's Date: 07/01/2017    History of Present Illness 61 year old woman with autoimmune hepatitis on steroids admitted with acute pyelonephritis and septic shock.  Also h/o HTN, ARF, UTI and obesity.    PT Comments    Pt pleasant and very eager to mobilize on arrival. Pt able to transfer to sitting and stand. Once standing pt not as verbal with VSS and needing cues to attend to task. Pt able to ambulate with continued decreased mentation during gait. Sister present throughout and agreeable to and aware of change in mentation with increased positioning.  Pt educated for need for assist for mobility and plan for progression. RN aware of pt status and mental status changes.   BP supine 93/62 before arrival to room Supine on arrival 111/78 95/65 after ambulation SpO2 91% on RA, 94% on 1L end of session HR 82   Follow Up Recommendations  Home health PT;Supervision/Assistance - 24 hour     Equipment Recommendations  Rolling walker with 5" wheels;3in1 (PT)    Recommendations for Other Services       Precautions / Restrictions Precautions Precautions: Fall    Mobility  Bed Mobility Overal bed mobility: Needs Assistance Bed Mobility: Supine to Sit     Supine to sit: Min assist     General bed mobility comments: min HHA to fully elevate trunk with cues for sequence and to scoot fully to EOB  Transfers Overall transfer level: Needs assistance   Transfers: Sit to/from Stand Sit to Stand: Min guard         General transfer comment: cues for hand placement and safety with RW present to stand from bed to RW  Ambulation/Gait Ambulation/Gait assistance: Min assist;+2 safety/equipment Ambulation Distance (Feet): 100 Feet Assistive device: Rolling walker (2 wheeled) Gait Pattern/deviations: Step-through pattern;Decreased stride length;Trunk flexed   Gait velocity  interpretation: Below normal speed for age/gender General Gait Details: Pt with short steps with assist to direct RW and avoid obstacles, consistent cues to step into RW. Chair to follow as pt with progressive decline in command following with VSS stable.    Stairs            Wheelchair Mobility    Modified Rankin (Stroke Patients Only)       Balance Overall balance assessment: Needs assistance   Sitting balance-Leahy Scale: Good       Standing balance-Leahy Scale: Poor                              Cognition Arousal/Alertness: Awake/alert Behavior During Therapy: WFL for tasks assessed/performed Overall Cognitive Status: Impaired/Different from baseline Area of Impairment: Following commands                       Following Commands: Follows one step commands with increased time       General Comments: Pt on arrival in bed, quick to respond and oriented. With progressive mobility pt with increased lethargy, decreased response to commands and difficulty maintaining eyes open with cues for arousal and attention      Exercises      General Comments        Pertinent Vitals/Pain Pain Assessment: 0-10 Pain Score: 6  Pain Location: HA Pain Descriptors / Indicators: Aching Pain Intervention(s): Limited activity within patient's tolerance;Repositioned;Other (comment) (RN made aware)    Home Living  Prior Function            PT Goals (current goals can now be found in the care plan section) Progress towards PT goals: Progressing toward goals    Frequency           PT Plan Current plan remains appropriate    Co-evaluation              AM-PAC PT "6 Clicks" Daily Activity  Outcome Measure  Difficulty turning over in bed (including adjusting bedclothes, sheets and blankets)?: A Little Difficulty moving from lying on back to sitting on the side of the bed? : Unable Difficulty sitting down on and  standing up from a chair with arms (e.g., wheelchair, bedside commode, etc,.)?: A Little Help needed moving to and from a bed to chair (including a wheelchair)?: A Little Help needed walking in hospital room?: A Lot Help needed climbing 3-5 steps with a railing? : A Lot 6 Click Score: 14    End of Session Equipment Utilized During Treatment: Gait belt Activity Tolerance: Patient limited by fatigue Patient left: in chair;with call bell/phone within reach;with family/visitor present;with chair alarm set Nurse Communication: Mobility status;Other (comment) (change in mentation with mobility despite VSS) PT Visit Diagnosis: Muscle weakness (generalized) (M62.81);Other abnormalities of gait and mobility (R26.89);Unsteadiness on feet (R26.81);Difficulty in walking, not elsewhere classified (R26.2)     Time: [id number] PT Time Calculation (min) (ACUTE ONLY): 24 min  Charges:  $Gait Training: 8-22 mins $Therapeutic Activity: 8-22 mins                    G Codes:       Dana Thornton, PT [phone]   Dana Thornton 07/01/2017, 11:39 AM

## 2017-07-01 NOTE — Progress Notes (Signed)
S: Ms. Sole says that she is feeling better today, she feels less lethargic.  O:BP 93/62   Pulse (!) 118   Temp 97.6 F (36.4 C) (Oral)   Resp (!) 23   Ht 5\' 2"  (1.575 m)   Wt 253 lb 4.9 oz (114.9 kg)   SpO2 90%   BMI 46.33 kg/m   Intake/Output Summary (Last 24 hours) at 07/01/17 1049 Last data filed at 07/01/17 0900  Gross per 24 hour  Intake             1690 ml  Output             1335 ml  Net              355 ml   Intake/Output: I/O last 3 completed shifts: In: 2530 [P.O.:730; I.V.:1750; IV Piggyback:50] Out: 2270 [Urine:2270]  Intake/Output this shift:  Total I/O In: 150 [I.V.:150] Out: 170 [Urine:170] Weight change: 3 lb 12 oz (1.7 kg) Gen: no acute distress aggitated and confused CVS: RRR Gr 2/6 sys M Resp: bilateral rhonchi  Abd: soft, non tender, non distended obese, liver down 4 cm Ext: 2+ sacral edema, bilateral lower extremity non pitting edema 3+ edema  Recent Labs Lab 06/26/17 2156  06/27/17 1003 06/28/17 0425 06/28/17 1439 06/29/17 0627 06/29/17 2123 06/30/17 0257 06/30/17 1234 07/01/17 0353  NA 130*  < > 130* 136 134* 132* 137 141  --  135  K 3.8  < > 4.3 4.0 4.3 6.3* 3.5 3.2*  --  2.8*  CL 97*  < > 103 106 109 108 110 110  --  107  CO2 21*  --  13* 17* 15* 14* 16* 19*  --  24  GLUCOSE 364*  < > 271* 102* 173* 203* 210* 109*  --  116*  BUN 48*  < > 48* 60* 63* 82* 89* 92*  --  87*  CREATININE 2.65*  < > 3.15* 3.49* 3.48* 3.59* 3.74* 3.73*  --  3.41*  ALBUMIN 2.0*  --   --   --   --   --  1.3*  --  1.2*  --   CALCIUM 8.1*  --  7.0* 7.1* 6.7* 6.5* 6.5* 7.0*  --  6.5*  PHOS  --   --   --  2.2*  --  3.6  --   --   --  4.3  AST 44*  --   --   --   --   --   --   --  104*  --   ALT 49  --   --   --   --   --   --   --  49  --   < > = values in this interval not displayed. Liver Function Tests:  Recent Labs Lab 06/26/17 2156 06/29/17 2123 06/30/17 1234 07/01/17 0353  AST 44*  --  104*  --   ALT 49  --  49  --   ALKPHOS 279*  --  260*  --    BILITOT 3.6*  --  3.9* 2.3*  PROT 8.1  --  6.8  --   ALBUMIN 2.0* 1.3* 1.2*  --    No results for input(s): LIPASE, AMYLASE in the last 168 hours.  Recent Labs Lab 06/27/17 0357 06/30/17 1119  AMMONIA 37* 20   CBC:  Recent Labs Lab 06/26/17 2156  06/28/17 0425 06/29/17 0627 06/30/17 0257 07/01/17 0353  WBC 13.9*  --  21.7* 30.7*  24.2* 20.5*  NEUTROABS 12.6*  --   --   --   --   --   HGB 12.2  < > 10.9* 11.8* 12.3 10.0*  HCT 34.4*  < > 31.0* 33.3* 33.3* 27.9*  MCV 86.0  --  85.4 86.0 83.7 84.3  PLT 55*  --  27* 31* 34* 41*  < > = values in this interval not displayed. Cardiac Enzymes:  Recent Labs Lab 06/27/17 0357 06/27/17 1003 06/27/17 1548 06/27/17 2101  CKTOTAL 16*  --   --   --   TROPONINI  --  0.04* 0.05* 0.07*   CBG:  Recent Labs Lab 06/30/17 1627 06/30/17 1954 07/01/17 0017 07/01/17 0355 07/01/17 0817  GLUCAP 181* 198* 134* 119* 143*    Iron Studies: No results for input(s): IRON, TIBC, TRANSFERRIN, FERRITIN in the last 72 hours. Studies/Results: US Renal  Result Date: 06/30/2017 CLINICAL DATA:  Acute renal injury. EXAM: RENAL / URINARY TRACT ULTRASOUND COMPLETE COMPARISON:  CT scan June 27, 2017 FINDINGS: Right Kidney: Length: 14 cm. Cysts are seen in the right kidney with the largest measuring 3.7 cm. No hydronephrosis, stones, or solid mass. Left Kidney: Length: 12.1 cm. Echogenicity within normal limits. No mass or hydronephrosis visualized. Bladder: Decompressed with a Foley catheter. IMPRESSION: 1. Right renal cysts are noted. No hydronephrosis, stones, or solid masses seen in either kidney. Electronically Signed   By: Gerome Sam III M.D   On: 06/30/2017 12:41   . chlorhexidine  15 mL Mouth Rinse BID  . heparin  5,000 Units Subcutaneous Q8H  . insulin aspart  0-20 Units Subcutaneous Q4H  . insulin glargine  30 Units Subcutaneous QHS  . ipratropium-albuterol  3 mL Nebulization Once  . levothyroxine  150 mcg Oral QAC breakfast  .  mouth rinse  15 mL Mouth Rinse BID  . potassium chloride  40 mEq Oral Q12H    BMET    Component Value Date/Time   NA 135 07/01/2017 0353   K 2.8 (L) 07/01/2017 0353   CL 107 07/01/2017 0353   CO2 24 07/01/2017 0353   GLUCOSE 116 (H) 07/01/2017 0353   BUN 87 (H) 07/01/2017 0353   CREATININE 3.41 (H) 07/01/2017 0353   CALCIUM 6.5 (L) 07/01/2017 0353   GFRNONAA 14 (L) 07/01/2017 0353   GFRAA 16 (L) 07/01/2017 0353   CBC    Component Value Date/Time   WBC 20.5 (H) 07/01/2017 0353   RBC 3.31 (L) 07/01/2017 0353   HGB 10.0 (L) 07/01/2017 0353   HCT 27.9 (L) 07/01/2017 0353   PLT 41 (L) 07/01/2017 0353   MCV 84.3 07/01/2017 0353   MCH 30.2 07/01/2017 0353   MCHC 35.8 07/01/2017 0353   RDW 17.9 (H) 07/01/2017 0353   LYMPHSABS 0.8 06/26/2017 2156   MONOABS 0.4 06/26/2017 2156   EOSABS 0.1 06/26/2017 2156   BASOSABS 0.0 06/26/2017 2156    Assessment/Plan:  1. AKI/CKD secondary to uncontrolled hypertension and type 2 diabetes worsened by acute pyelonephritis, hypotension and vancomycin. Less likely hepatorenal as she is not oliguric. BUN still elevated, potassium is low, anion gap low but she is developing volume overload.  - continue treating pyelo - developing volume overload, bicarbonate gtt discontinued and she should auto diurese with return of renal function  2. E coli pyelonephritis and bacteremia- on rocephin, planning for 14 days  3. Hyponatremia, resolved  4. Hyponatremia, repleted   5. Lactic acidosis, resolved  6. Thrombocytopenia possibly secondary to sepsis, hepatic disease - improving  7.  Autoimmune hepatitis - on home dose of prednisone  8. Uncontrolled type 2 diabetes - per primary  Eulah PontNina Blum, MD  I have seen and examined this patient and agree with the plan of care seen, eval, examined, discussed with staff, patient, husband.  Discussed with resident, changes made.  No GFR recovery but nonoliguric and less acidemic .  Mckyle Solanki L 07/01/2017, 1:29  PM

## 2017-07-01 NOTE — Progress Notes (Signed)
Name: Dana Thornton MRN: [id number] DOB: Apr 19, 1956    ADMISSION DATE:  06/27/2017 CONSULTATION DATE:  10/18   REFERRING MD :  EDP  CHIEF COMPLAINT:  UTI   BRIEF PATIENT DESCRIPTION: 61yo female with hx autoimmune hepatitis, HTN, DM presented 10/17 with weakness, difficulty speaking, SOB.   Initial triage concern was for CVA but in ER found to be hypotensive, tachycardic without any focal deficits.  Initial lactate >6, u/a showed UTI, CT abd concerning for pyelo, AKI with Scr 2.65.   BP improved but lactate did not clear while in ER so PCCM consulted for ?ICU needs.    HISTORY OF PRESENT ILLNESS:  61yo female with hx autoimmune hepatitis, HTN, DM presented 10/17 with weakness, difficulty speaking, SOB.   Initial triage concern was for CVA but in ER found to be hypotensive, tachycardic without any focal deficits.  Initial lactate >6, u/a showed UTI, CT abd concerning for pyelo.   BP improved but lactate did not clear while in ER so PCCM consulted for ?ICU needs.  Currently feeling a bit better although still c/o lower back pain, dyspnea, general malaise. Denies chest pain, headache, hemoptysis, cough, fever, neck pain/stiffness, n/v/d, myalgias.   Recently dropped to 10mg /day of prednisone for autoimmune hepatitis.  Had her flu shot about 1 week prior to onset symptoms.    SUBJECTIVE:   No acute issues.  Anxious to go home soon.   VITAL SIGNS: Temp:  [97.6 F (36.4 C)-98.5 F (36.9 C)] 97.6 F (36.4 C) (10/22 0822) Pulse Rate:  [78-118] 93 (10/22 1100) Resp:  [11-31] 18 (10/22 1100) BP: (73-130)/(37-80) 124/72 (10/22 1100) SpO2:  [90 %-100 %] 99 % (10/22 1100) Weight:  [114.9 kg (253 lb 4.9 oz)] 114.9 kg (253 lb 4.9 oz) (10/22 0300)  PHYSICAL EXAMINATION: General:  Adult female, no distress. Neuro:  AAOx3, no focal deficits. Cardiovascular:  RRR, no m/r/g. Lungs:  CTAB. Abdomen:  Soft and nontender, non-distended. Musculoskeletal:  No LE edema.   Recent Labs Lab  06/29/17 2123 06/30/17 0257 07/01/17 0353  NA 137 141 135  K 3.5 3.2* 2.8*  CL 110 110 107  CO2 16* 19* 24  BUN 89* 92* 87*  CREATININE 3.74* 3.73* 3.41*  GLUCOSE 210* 109* 116*    Recent Labs Lab 06/29/17 0627 06/30/17 0257 07/01/17 0353  HGB 11.8* 12.3 10.0*  HCT 33.3* 33.3* 27.9*  WBC 30.7* 24.2* 20.5*  PLT 31* 34* 41*   US Renal  Result Date: 06/30/2017 CLINICAL DATA:  Acute renal injury. EXAM: RENAL / URINARY TRACT ULTRASOUND COMPLETE COMPARISON:  CT scan June 27, 2017 FINDINGS: Right Kidney: Length: 14 cm. Cysts are seen in the right kidney with the largest measuring 3.7 cm. No hydronephrosis, stones, or solid mass. Left Kidney: Length: 12.1 cm. Echogenicity within normal limits. No mass or hydronephrosis visualized. Bladder: Decompressed with a Foley catheter. IMPRESSION: 1. Right renal cysts are noted. No hydronephrosis, stones, or solid masses seen in either kidney. Electronically Signed   By: Gerome Sam III M.D   On: 06/30/2017 12:41    STUDIES:  CT head 10/17>>> neg acute  CT abd/pelvis 10/18>>>1. Mild right kidney perinephric stranding may represent underlying infection or inflammation of the renal collecting system. No hydronephrosis. Right kidney interpolar punctate nonobstructive nephrolithiasis. Cholelithiasis. Aortic atherosclerosis. Sigmoid diverticulosis.  Renal US 10/21 > right renal cysts, no hydro.  ANTIBIOTICS Vancomycin 10/18 >> 10/19 Zosyn 10/18 >> 10/21 Ceftriaxone 10/21 > (stop date 11/4)  ASSESSMENT / PLAN:  Sepsis - due  to pyelo and E.coli bacteremia. PLAN -  Continue ceftriaxone x 14 days (started 10/21, stop date 11/4). Holding preadmission carvedilol.  Bronchospasm. PLAN -  Continue DuoNebs PRN, Albuterol PRN.  AKI with metabolic acidosis - improving. Hypokalemia - s/p repletion. PLAN -  Repeat BMP this PM and in AM.  Hx DM, hypothyroidism On chronic prednisone for autoimmune hepatitis PLAN -  Continue resistant sliding  scale + lantus. Continue preadmission synthroid, prednisone.  Autoimmune hepatitis - mildly elevated LFT's, high bili.  PLAN -  Continue prednisone 10mg /day. Monitor LFT's.  Pain. Plan - Ibuprofen q8hrs PRN.   Transfer out of ICU to med surge.  Will ask [facility] to assume care starting AM 10/23 and PCCM off at that time.   Rutherford Guysahul Desai, [place] Sidonie Dickens- [facility] Pager: [phone]  or [phone] 07/01/2017, 11:26 AM   STAFF NOTE: I, Rory Percyaniel Shekela Goodridge, MD FACP have personally reviewed patient's available data, including medical history, events of note, physical examination and test results as part of my evaluation. I have discussed with resident/NP and other care providers such as pharmacist, RN and RRT. In addition, I personally evaluated patient and elicited key findings of: awake,a lert, follows commands, jvd wnl, cta, abdo soft, , edema mild, pos 610 cc, pcxr last I reviewed shows linear ATX rt base and small volumes, important to use IS, renal US reviewed by me, renal fxn ias improving on own, allow, lasix may be required, no hydro, chem in am , ambulate as able, plat noted, low threshold dc heparin, monitor LFt closely, steroids tyo remain, SLP, would like to limit ibuprofen with ARF, low plat etc, abx course 2 weeks planned,   Mcarthur Rossettianiel J. Tyson AliasFeinstein, MD, FACP Pgr: [phone] [facility] 07/01/2017 1:20 PM

## 2017-07-01 NOTE — Progress Notes (Signed)
eLink Physician-Brief Progress Note Patient Name: Dana LamerGloria W Mccleese DOB: [birth year] MRN: [id number]   Date of Service  07/01/2017  HPI/Events of Note  K 2.8  eICU Interventions  Repleted     Intervention Category Minor Interventions: Electrolytes abnormality - evaluation and management  Illa Enlow 07/01/2017, 6:28 AM

## 2017-07-02 DIAGNOSIS — R0603 Acute respiratory distress: Secondary | ICD-10-CM

## 2017-07-02 LAB — BASIC METABOLIC PANEL
ANION GAP: 7 (ref 5–15)
BUN: 69 mg/dL — ABNORMAL HIGH (ref 6–20)
CHLORIDE: 110 mmol/L (ref 101–111)
CO2: 23 mmol/L (ref 22–32)
Calcium: 7.1 mg/dL — ABNORMAL LOW (ref 8.9–10.3)
Creatinine, Ser: 2.75 mg/dL — ABNORMAL HIGH (ref 0.44–1.00)
GFR, EST AFRICAN AMERICAN: 20 mL/min — AB (ref >60)
GFR, EST NON AFRICAN AMERICAN: 18 mL/min — AB (ref >60)
Glucose, Bld: 179 mg/dL — ABNORMAL HIGH (ref 65–99)
POTASSIUM: 3.5 mmol/L (ref 3.5–5.1)
SODIUM: 140 mmol/L (ref 135–145)

## 2017-07-02 LAB — CBC
HEMATOCRIT: 28 % — AB (ref 36.0–46.0)
HEMOGLOBIN: 9.9 g/dL — AB (ref 12.0–15.0)
MCH: 29.8 pg (ref 26.0–34.0)
MCHC: 35.4 g/dL (ref 30.0–36.0)
MCV: 84.3 fL (ref 78.0–100.0)
Platelets: 48 10*3/uL — ABNORMAL LOW (ref 150–400)
RBC: 3.32 MIL/uL — ABNORMAL LOW (ref 3.87–5.11)
RDW: 17.9 % — AB (ref 11.5–15.5)
WBC: 22.6 10*3/uL — AB (ref 4.0–10.5)

## 2017-07-02 LAB — GLUCOSE, CAPILLARY
GLUCOSE-CAPILLARY: 159 mg/dL — AB (ref 65–99)
GLUCOSE-CAPILLARY: 228 mg/dL — AB (ref 65–99)
GLUCOSE-CAPILLARY: 253 mg/dL — AB (ref 65–99)
Glucose-Capillary: 297 mg/dL — ABNORMAL HIGH (ref 65–99)

## 2017-07-02 LAB — PHOSPHORUS: PHOSPHORUS: 3.7 mg/dL (ref 2.5–4.6)

## 2017-07-02 LAB — MAGNESIUM: MAGNESIUM: 2.6 mg/dL — AB (ref 1.7–2.4)

## 2017-07-02 LAB — SODIUM, URINE, RANDOM: Sodium, Ur: <10 mmol/L

## 2017-07-02 MED ORDER — IPRATROPIUM-ALBUTEROL 0.5-2.5 (3) MG/3ML IN SOLN
3.0000 mL | RESPIRATORY_TRACT | Status: DC | PRN
  Administered 2017-07-02 (×2): 3 mL via RESPIRATORY_TRACT
  Filled 2017-07-02 (×2): qty 3

## 2017-07-02 MED ORDER — LEVOFLOXACIN 500 MG PO TABS: 500.0000 mg | ORAL_TABLET | ORAL | Status: DC

## 2017-07-02 MED ORDER — IPRATROPIUM-ALBUTEROL 0.5-2.5 (3) MG/3ML IN SOLN
3.0000 mL | Freq: Four times a day (QID) | RESPIRATORY_TRACT | Status: DC
  Administered 2017-07-02 – 2017-07-03 (×3): 3 mL via RESPIRATORY_TRACT
  Filled 2017-07-02 (×3): qty 3

## 2017-07-02 MED ORDER — CEFUROXIME AXETIL 500 MG PO TABS
500.0000 mg | ORAL_TABLET | Freq: Every day | ORAL | Status: DC
  Administered 2017-07-02 – 2017-07-03 (×2): 500 mg via ORAL
  Filled 2017-07-02 (×2): qty 1

## 2017-07-02 NOTE — Care Management Note (Signed)
Case Management Note  Patient Details  Name: Dana Thornton MRN: [id number] Date of Birth: [birth year]  Subjective/Objective:                    Action/Plan: Talked with patient and sister at bedside , discussed HHPT and home oxygen . Explained portable oxygen tank will be delivered to bedside before discharge. Provided a list of home health agencies , will continue to follow.   Expected Discharge Date:                  Expected Discharge Plan:  Home w Home Health Services  In-House Referral:     Discharge planning Services  CM Consult  Post Acute Care Choice:  Durable Medical Equipment, Home Health Choice offered to:  Patient, Sibling  DME Arranged:  Oxygen DME Agency:  [facility]  [facility]     Status of Service:  In process, will continue to follow  If discussed at Long Length of Stay Meetings, dates discussed:    Additional Comments:  Dana Thornton, Dana Rhine Marie, RN 07/02/2017, 12:02 PM

## 2017-07-02 NOTE — Progress Notes (Signed)
PROGRESS NOTE    Darene LamerGloria W Szabo  ZOX:[id number] DOB: [birth year] DOA: 06/27/2017 PCP: Maurice SmallGriffin, Elaine, MD   Specialists:     Brief Narrative:  4161 ? Diabetes mellitus type 2 Autoimmune hepatitis-on mercaptopurine and prednisone--diagnosed 10/08/2008  Prednisone cut back from 20-10 about 2 weeks PTA under the guidance of Dr. [facility]n [facility] Hypothyroidism Previous kidney stones BMI 44 Hypertension Persistent oropharyngeal thrush    admitted by critical care 06/27/2017 septic shock,lactic acidosis 6 urine analysis suggestive of pyelonephritis AK I creatinine 2.6 CT scan admission ? Bilateral-no hydronephrosis   Assessment & Plan:   Active Problems:   Sepsis (HCC)   AKI (acute kidney injury) (HCC)   Septic shock (HCC)   Pyelonephritis, acute   Hyperkalemia   Sepsis secondary to pansensitive Escherichia coli bacteremia and pyelonephritis  Sepsis physiology has cleared  Initially was placed on ceftriaxone 10/17 and has received 6 days of IV therapy  Transitioning to Levaquin to complete treatment on 10/27  Monitor renal panel   Hypoxic respiratory failure  Desat to 88% on ambulation, needs oxygen  Acute kidney injury with metabolic acidosis--previous creatinine 1.6, 04/2015 Hypokalemia 2.8-->4.3  Nephrology consulted, was on bicarbonate earlier in admission-stopped on 10/22  Creatinine 3.4---->3.1 -->2.75  Non-oliguric at present and producing more urine over past 24 hours--since 10/21 ~ 4 liters neg  Mild hypotension  Unclear how it was measured she is not symptomatic at present  Autoimmune hepatitis, thrombocytopenia  bilirubin from 3.9-2.3  Previously on mercaptopurine-dose of prednisone recently chart from 20-10-continuing 10 mg on discharge  Last LFTs 10/21 alkaline phosphatase 260 AST 104 bilirubin 3.9--- repeat labs in a.m.  Needs outpatient follow-up [facility]  Diabetes mellitus type 2  Blood sugar 159-218  Lantus 30 units at bedtime, sliding  scale resistant coverage  159--218  Hypothyroidism  Continue levothyroxine 150 g a.m.  Needs TSH in about one month  DVT prophylaxis: Lovenox Code Status: Full code Family Communication: No family present-called Husband and updated Disposition Plan: Inpatient--up out of bed needs desaturation screen Home health has been recommended by therapy and is nearing discharge potentially in 48-72 hours depending on clinical stability   Consultants:   Renal  Procedures:   CT scan, renal ultrasound  Antimicrobials:  ANTIBIOTICS Vancomycin 10/18 >> 10/19 Zosyn 10/18 >> 10/21 Ceftriaxone 10/21 > 10/23 Levaquin 10/23-10/27 expected stop date  Subjective:  Patient tearful and emotional and has not had much sleep had 40% of her breakfast   Overall seems improved compared to prior No dysuria no fever no chills   Objective: Vitals:   07/01/17 2024 07/01/17 2027 07/02/17 0342 07/02/17 0744  BP:  (!) 95/57 (!) 94/52   Pulse:  91 87   Resp:  19    Temp:  98.7 F (37.1 C) 99 F (37.2 C)   TempSrc:   Oral   SpO2: 98% 100% 99% 98%  Weight:      Height:        Intake/Output Summary (Last 24 hours) at 07/02/17 0830 Last data filed at 07/02/17 0400  Gross per 24 hour  Intake              940 ml  Output              500 ml  Net              440 ml   Filed Weights   06/30/17 0600 07/01/17 0300 07/01/17 1326  Weight: 113.2 kg (249 lb 9 oz) 114.9 kg (253 lb  4.9 oz) 114.2 kg (251 lb 11.2 oz)    Examination:  Obese extraocular movements intact Mallampati 3 No JVD S1-S2 no murmur Chest is clinically clear with no added sound Abdomen soft nontender Stage II lower extremity edema Did not examine sacrum Neurologically appears slightly tired and listless but otherwise moves all 4 limbs without deficit smile is symmetric   Data Reviewed: I have personally reviewed following labs and imaging studies  CBC:  Recent Labs Lab 06/26/17 2156 06/26/17 2215 06/28/17 0425  06/29/17 0627 06/30/17 0257 07/01/17 0353  WBC 13.9*  --  21.7* 30.7* 24.2* 20.5*  NEUTROABS 12.6*  --   --   --   --   --   HGB 12.2 12.9 10.9* 11.8* 12.3 10.0*  HCT 34.4* 38.0 31.0* 33.3* 33.3* 27.9*  MCV 86.0  --  85.4 86.0 83.7 84.3  PLT 55*  --  27* 31* 34* 41*   Basic Metabolic Panel:  Recent Labs Lab 06/28/17 0425  06/29/17 0627 06/29/17 2123 06/30/17 0257 07/01/17 0353 07/01/17 1215  NA 136  < > 132* 137 141 135 139  K 4.0  < > 6.3* 3.5 3.2* 2.8* 4.3  CL 106  < > 108 110 110 107 109  CO2 17*  < > 14* 16* 19* 24 22  GLUCOSE 102*  < > 203* 210* 109* 116* 227*  BUN 60*  < > 82* 89* 92* 87* 84*  CREATININE 3.49*  < > 3.59* 3.74* 3.73* 3.41* 3.17*  CALCIUM 7.1*  < > 6.5* 6.5* 7.0* 6.5* 6.9*  MG 1.3*  --  2.6*  --   --  2.6*  --   PHOS 2.2*  --  3.6  --   --  4.3  --   < > = values in this interval not displayed. GFR: Estimated Creatinine Clearance: 22.3 mL/min (A) (by C-G formula based on SCr of 3.17 mg/dL (H)). Liver Function Tests:  Recent Labs Lab 06/26/17 2156 06/29/17 2123 06/30/17 1234 07/01/17 0353  AST 44*  --  104*  --   ALT 49  --  49  --   ALKPHOS 279*  --  260*  --   BILITOT 3.6*  --  3.9* 2.3*  PROT 8.1  --  6.8  --   ALBUMIN 2.0* 1.3* 1.2*  --    No results for input(s): LIPASE, AMYLASE in the last 168 hours.  Recent Labs Lab 06/27/17 0357 06/30/17 1119  AMMONIA 37* 20   Coagulation Profile:  Recent Labs Lab 06/26/17 2156  INR 1.67   Cardiac Enzymes:  Recent Labs Lab 06/27/17 0357 06/27/17 1003 06/27/17 1548 06/27/17 2101  CKTOTAL 16*  --   --   --   TROPONINI  --  0.04* 0.05* 0.07*   BNP (last 3 results) No results for input(s): PROBNP in the last 8760 hours. HbA1C: No results for input(s): HGBA1C in the last 72 hours. CBG:  Recent Labs Lab 07/01/17 0817 07/01/17 1206 07/01/17 1605 07/01/17 2022 07/02/17 0739  GLUCAP 143* 181* 210* 218* 159*   Lipid Profile: No results for input(s): CHOL, HDL, LDLCALC, TRIG,  CHOLHDL, LDLDIRECT in the last 72 hours. Thyroid Function Tests: No results for input(s): TSH, T4TOTAL, FREET4, T3FREE, THYROIDAB in the last 72 hours. Anemia Panel: No results for input(s): VITAMINB12, FOLATE, FERRITIN, TIBC, IRON, RETICCTPCT in the last 72 hours. Urine analysis:    Component Value Date/Time   COLORURINE AMBER (A) 06/28/2017 0626   APPEARANCEUR TURBID (A) 06/28/2017 1610  LABSPEC 1.021 06/28/2017 0626   PHURINE 5.0 06/28/2017 0626   GLUCOSEU NEGATIVE 06/28/2017 0626   HGBUR LARGE (A) 06/28/2017 0626   BILIRUBINUR NEGATIVE 06/28/2017 0626   KETONESUR NEGATIVE 06/28/2017 0626   PROTEINUR 100 (A) 06/28/2017 0626   UROBILINOGEN 1.0 04/09/2015 2040   NITRITE NEGATIVE 06/28/2017 0626   LEUKOCYTESUR LARGE (A) 06/28/2017 0626     Radiology Studies: Reviewed images personally in health database    Scheduled Meds: . aspirin  325 mg Oral Daily  . chlorhexidine  15 mL Mouth Rinse BID  . insulin aspart  0-20 Units Subcutaneous TID AC & HS  . insulin glargine  30 Units Subcutaneous QHS  . ipratropium-albuterol  3 mL Nebulization Once  . ipratropium-albuterol  3 mL Nebulization QID  . levothyroxine  150 mcg Oral QAC breakfast  . mouth rinse  15 mL Mouth Rinse BID  . predniSONE  10 mg Oral Q breakfast   Continuous Infusions: . cefTRIAXone (ROCEPHIN)  IV Stopped (07/01/17 1200)     LOS: 5 days    Time spent: 3    Pleas Koch, MD [facility] ([facility]   If 7PM-7AM, please contact night-coverage [email or web address] Password [email or web address] 07/02/2017, 8:30 AM

## 2017-07-02 NOTE — Progress Notes (Signed)
  Speech Language Pathology Treatment: Dysphagia  Patient Details Name: Dana Thornton MRN: [id number] DOB: November 17, 1955 Today's Date: 07/02/2017 Time: 6606-3016 SLP Time Calculation (min) (ACUTE ONLY): 11 min  Assessment / Plan / Recommendation Clinical Impression  Pt's diet was advanced to regular solids, thin liquids.  She is alert, interactive, tolerating POs with improved awareness/anticipation.  No overt s/s of aspiration; improved mastication; brisk swallow response per observation.  SLP services to sign off.    HPI HPI: 61yo female with hx autoimmune hepatitis, HTN, DM presented 10/17 with weakness, difficulty speaking, SOB. Initial triage concern was for CVA but in ER found to be hypotensive, tachycardic without any focal deficits. Initial lactate >6, u/a showed UTI, CT head with no acute findings, CT abd concerning for pyelo. Admitted with acute pyelonephritis and septic shock.      SLP Plan  All goals met       Recommendations  Diet recommendations: Regular;Thin liquid Liquids provided via: Cup;Straw Medication Administration: Whole meds with liquid Supervision: Patient able to self feed                Oral Care Recommendations: Oral care BID Follow up Recommendations: None SLP Visit Diagnosis: Dysphagia, unspecified (R13.10) Plan: All goals met       GO                Dana Thornton 07/02/2017, 4:00 PM   Dana Thornton, [place] CCC/SLP Pager [phone]

## 2017-07-02 NOTE — Progress Notes (Signed)
S: Reports she's feeling well today, that her back pain is improving. Denies new symptoms.   O:BP 113/64 (BP Location: Left Arm)   Pulse 90   Temp 99 F (37.2 C) (Oral)   Resp 19   Ht 5\' 2"  (1.575 m)   Wt 251 lb 11.2 oz (114.2 kg)   SpO2 (!) 88% Comment: after ambulating/Rn notified  BMI 46.04 kg/m   Intake/Output Summary (Last 24 hours) at 07/02/17 1131 Last data filed at 07/02/17 0853  Gross per 24 hour  Intake              600 ml  Output              590 ml  Net               10 ml   Intake/Output: I/O last 3 completed shifts: In: 1770 [P.O.:1020; I.V.:700; IV Piggyback:50] Out: 1350 [Urine:1350]  Intake/Output this shift:  Total I/O In: -  Out: 200 [Urine:200] Weight change: -1 lb 9.7 oz (-0.73 kg) Gen: sitting up in chair eating breakfast, no acute distress  CVS: RRR Gr2/6 SEM Resp: lungs sound clear Crackles L base Abd: soft, non tender, non distended Obese, liver down 5 cm Ext: 1+ bilateral lower extremity pitting edema    Recent Labs Lab 06/26/17 2156  06/28/17 0425 06/28/17 1439 06/29/17 4782 06/29/17 2123 06/30/17 0257 06/30/17 1234 07/01/17 0353 07/01/17 1215 07/02/17 0821  NA 130*  < > 136 134* 132* 137 141  --  135 139 140  K 3.8  < > 4.0 4.3 6.3* 3.5 3.2*  --  2.8* 4.3 3.5  CL 97*  < > 106 109 108 110 110  --  107 109 110  CO2 21*  < > 17* 15* 14* 16* 19*  --  24 22 23   GLUCOSE 364*  < > 102* 173* 203* 210* 109*  --  116* 227* 179*  BUN 48*  < > 60* 63* 82* 89* 92*  --  87* 84* 69*  CREATININE 2.65*  < > 3.49* 3.48* 3.59* 3.74* 3.73*  --  3.41* 3.17* 2.75*  ALBUMIN 2.0*  --   --   --   --  1.3*  --  1.2*  --   --   --   CALCIUM 8.1*  < > 7.1* 6.7* 6.5* 6.5* 7.0*  --  6.5* 6.9* 7.1*  PHOS  --   --  2.2*  --  3.6  --   --   --  4.3  --  3.7  AST 44*  --   --   --   --   --   --  104*  --   --   --   ALT 49  --   --   --   --   --   --  49  --   --   --   < > = values in this interval not displayed. Liver Function Tests:  Recent Labs Lab  06/26/17 2156 06/29/17 2123 06/30/17 1234 07/01/17 0353  AST 44*  --  104*  --   ALT 49  --  49  --   ALKPHOS 279*  --  260*  --   BILITOT 3.6*  --  3.9* 2.3*  PROT 8.1  --  6.8  --   ALBUMIN 2.0* 1.3* 1.2*  --    No results for input(s): LIPASE, AMYLASE in the last 168 hours.  Recent Labs Lab 06/27/17  0981 06/30/17 1119  AMMONIA 37* 20   CBC:  Recent Labs Lab 06/26/17 2156  06/28/17 0425 06/29/17 0627 06/30/17 0257 07/01/17 0353  WBC 13.9*  --  21.7* 30.7* 24.2* 20.5*  NEUTROABS 12.6*  --   --   --   --   --   HGB 12.2  < > 10.9* 11.8* 12.3 10.0*  HCT 34.4*  < > 31.0* 33.3* 33.3* 27.9*  MCV 86.0  --  85.4 86.0 83.7 84.3  PLT 55*  --  27* 31* 34* 41*  < > = values in this interval not displayed. Cardiac Enzymes:  Recent Labs Lab 06/27/17 0357 06/27/17 1003 06/27/17 1548 06/27/17 2101  CKTOTAL 16*  --   --   --   TROPONINI  --  0.04* 0.05* 0.07*   CBG:  Recent Labs Lab 07/01/17 0817 07/01/17 1206 07/01/17 1605 07/01/17 2022 07/02/17 0739  GLUCAP 143* 181* 210* 218* 159*    Iron Studies: No results for input(s): IRON, TIBC, TRANSFERRIN, FERRITIN in the last 72 hours. Studies/Results: US Renal  Result Date: 06/30/2017 CLINICAL DATA:  Acute renal injury. EXAM: RENAL / URINARY TRACT ULTRASOUND COMPLETE COMPARISON:  CT scan June 27, 2017 FINDINGS: Right Kidney: Length: 14 cm. Cysts are seen in the right kidney with the largest measuring 3.7 cm. No hydronephrosis, stones, or solid mass. Left Kidney: Length: 12.1 cm. Echogenicity within normal limits. No mass or hydronephrosis visualized. Bladder: Decompressed with a Foley catheter. IMPRESSION: 1. Right renal cysts are noted. No hydronephrosis, stones, or solid masses seen in either kidney. Electronically Signed   By: Gerome Sam III M.D   On: 06/30/2017 12:41   . aspirin  325 mg Oral Daily  . cefUROXime  500 mg Oral Q breakfast  . chlorhexidine  15 mL Mouth Rinse BID  . insulin aspart  0-20 Units  Subcutaneous TID AC & HS  . insulin glargine  30 Units Subcutaneous QHS  . ipratropium-albuterol  3 mL Nebulization Once  . levothyroxine  150 mcg Oral QAC breakfast  . mouth rinse  15 mL Mouth Rinse BID  . predniSONE  10 mg Oral Q breakfast    BMET    Component Value Date/Time   NA 140 07/02/2017 0821   K 3.5 07/02/2017 0821   CL 110 07/02/2017 0821   CO2 23 07/02/2017 0821   GLUCOSE 179 (H) 07/02/2017 0821   BUN 69 (H) 07/02/2017 0821   CREATININE 2.75 (H) 07/02/2017 0821   CALCIUM 7.1 (L) 07/02/2017 0821   GFRNONAA 18 (L) 07/02/2017 0821   GFRAA 20 (L) 07/02/2017 0821   CBC    Component Value Date/Time   WBC 20.5 (H) 07/01/2017 0353   RBC 3.31 (L) 07/01/2017 0353   HGB 10.0 (L) 07/01/2017 0353   HCT 27.9 (L) 07/01/2017 0353   PLT 41 (L) 07/01/2017 0353   MCV 84.3 07/01/2017 0353   MCH 30.2 07/01/2017 0353   MCHC 35.8 07/01/2017 0353   RDW 17.9 (H) 07/01/2017 0353   LYMPHSABS 0.8 06/26/2017 2156   MONOABS 0.4 06/26/2017 2156   EOSABS 0.1 06/26/2017 2156   BASOSABS 0.0 06/26/2017 2156     Assessment/Plan:  AKI/CKD secondary to uncontrolled hypertension and type 2 diabetes worsened by acute pyelonephritis, hypotension and vancomycin. BUN still elevated but improving, bicarb stable, and anion gap remains stable. Ischemic ATN, solute better, acid/base better - continue treating pyelo - reports good urine output, sounds to be more than what is recorded, can continue to hold fluids Has  vol xs 2. E coli pyelonephritis and bacteremia- on rocephin, planning for 14 days  3. Hyponatremia, resolved  4. HypoKalemia  resolved   5. Lactic acidosis, resolved  6. Thrombocytopenia possibly secondary to sepsis, hepatic disease -lab is pending at this time  7. Autoimmune hepatitis - on home dose of prednisone  8. Uncontrolled type 2 diabetes - per primary 9. Obesity  Eulah PontNina Blum, MD

## 2017-07-03 LAB — COMPREHENSIVE METABOLIC PANEL
ALBUMIN: 1.5 g/dL — AB (ref 3.5–5.0)
ALT: 41 U/L (ref 14–54)
ANION GAP: 10 (ref 5–15)
AST: 63 U/L — AB (ref 15–41)
Alkaline Phosphatase: 313 U/L — ABNORMAL HIGH (ref 38–126)
BILIRUBIN TOTAL: 1.7 mg/dL — AB (ref 0.3–1.2)
BUN: 57 mg/dL — AB (ref 6–20)
CO2: 21 mmol/L — ABNORMAL LOW (ref 22–32)
Calcium: 7.2 mg/dL — ABNORMAL LOW (ref 8.9–10.3)
Chloride: 106 mmol/L (ref 101–111)
Creatinine, Ser: 2.35 mg/dL — ABNORMAL HIGH (ref 0.44–1.00)
GFR calc Af Amer: 25 mL/min — ABNORMAL LOW (ref >60)
GFR calc non Af Amer: 21 mL/min — ABNORMAL LOW (ref >60)
GLUCOSE: 215 mg/dL — AB (ref 65–99)
POTASSIUM: 3.4 mmol/L — AB (ref 3.5–5.1)
SODIUM: 137 mmol/L (ref 135–145)
TOTAL PROTEIN: 7.7 g/dL (ref 6.5–8.1)

## 2017-07-03 LAB — CBC WITH DIFFERENTIAL/PLATELET
BASOS ABS: 0 10*3/uL (ref 0.0–0.1)
Basophils Relative: 0 %
Eosinophils Absolute: 0.3 10*3/uL (ref 0.0–0.7)
Eosinophils Relative: 1 %
HCT: 27.2 % — ABNORMAL LOW (ref 36.0–46.0)
Hemoglobin: 9.6 g/dL — ABNORMAL LOW (ref 12.0–15.0)
LYMPHS ABS: 3.9 10*3/uL (ref 0.7–4.0)
Lymphocytes Relative: 15 %
MCH: 30.3 pg (ref 26.0–34.0)
MCHC: 35.3 g/dL (ref 30.0–36.0)
MCV: 85.8 fL (ref 78.0–100.0)
MONO ABS: 2.3 10*3/uL — AB (ref 0.1–1.0)
Monocytes Relative: 9 %
NEUTROS PCT: 75 %
Neutro Abs: 19.3 10*3/uL — ABNORMAL HIGH (ref 1.7–7.7)
PLATELETS: 81 10*3/uL — AB (ref 150–400)
RBC: 3.17 MIL/uL — AB (ref 3.87–5.11)
RDW: 17.8 % — AB (ref 11.5–15.5)
WBC: 25.8 10*3/uL — AB (ref 4.0–10.5)

## 2017-07-03 LAB — GLUCOSE, CAPILLARY
GLUCOSE-CAPILLARY: 216 mg/dL — AB (ref 65–99)
GLUCOSE-CAPILLARY: 225 mg/dL — AB (ref 65–99)

## 2017-07-03 MED ORDER — BREATHERITE COLL SPACER ADULT MISC: 1.0000 | Freq: Four times a day (QID) | 0 refills | Status: DC | PRN

## 2017-07-03 MED ORDER — ALBUTEROL SULFATE HFA 108 (90 BASE) MCG/ACT IN AERS: 2.0000 | INHALATION_SPRAY | Freq: Four times a day (QID) | RESPIRATORY_TRACT | 2 refills | Status: DC | PRN

## 2017-07-03 MED ORDER — CEFUROXIME AXETIL 500 MG PO TABS: 500.0000 mg | ORAL_TABLET | Freq: Every day | ORAL | 0 refills | Status: DC

## 2017-07-03 MED ORDER — BENZONATATE 100 MG PO CAPS
100.0000 mg | ORAL_CAPSULE | Freq: Three times a day (TID) | ORAL | Status: DC | PRN
  Administered 2017-07-03: 100 mg via ORAL
  Filled 2017-07-03: qty 1

## 2017-07-03 NOTE — Progress Notes (Signed)
SATURATION QUALIFICATIONS: (This note is used to comply with regulatory documentation for home oxygen)  Patient Saturations on Room Air at Rest = 100%  Patient Saturations on Room Air while Ambulating = 93%  Patient Saturations on 0 Liters of oxygen while Ambulating = 93%   

## 2017-07-03 NOTE — Discharge Summary (Signed)
Physician Discharge Summary  Dana Thornton ZOX:[id number] DOB: March 31, 1956 DOA: 06/27/2017  PCP: Maurice Small, MD  Admit date: 06/27/2017 Discharge date: 07/03/2017  Time spent: 25 minutes  Recommendations for Outpatient Follow-up:  1. Patient will need to complete oral Ceftin course for bacteremia on 10/27 2. Recommend CBC plus differential as well as basic metabolic panel in 1 week given acute kidney injury this admission 3. Recommend follow-up with gastroenterology [facility] regarding prednisone dosing for autoimmune hepatitis 4. Consider repeat TSH in about 1 month  Discharge Diagnoses:  Active Problems:   Sepsis (HCC)   AKI (acute kidney injury) (HCC)   Septic shock (HCC)   Pyelonephritis, acute   Hyperkalemia   Discharge Condition: 40  Diet recommendation: Diabetic and heart healthy  Filed Weights   07/01/17 0300 07/01/17 1326 07/03/17 0423  Weight: 114.9 kg (253 lb 4.9 oz) 114.2 kg (251 lb 11.2 oz) 114.7 kg (252 lb 14.4 oz)    History of present illness:  64 ? Diabetes mellitus type 2 Autoimmune hepatitis-on mercaptopurine and prednisone--diagnosed 10/08/2008             Prednisone cut back from 20-10 about 2 weeks PTA under the guidance of Dr. Madilyn Fireman gastroenterology Hypothyroidism Previous kidney stones BMI 44 Hypertension Persistent oropharyngeal thrush    admitted by critical care 06/27/2017 septic shock,lactic acidosis 6 urine analysis suggestive of pyelonephritis AK I creatinine 2.6 CT scan admission ? Bilateral-no hydronephrosis  Hospital Course:  Sepsis secondary to pansensitive Escherichia coli bacteremia and pyelonephritis             Sepsis physiology has cleared             Initially was placed on ceftriaxone 10/17 and has received 6 days of IV therapy             Transitioning to Ceftin to complete treatment on 10/27             Monitor renal panel as an outpatient in about 1 week  Hypoxic respiratory failure Bronchospasm              Desat to 80s-90s but was holding breath and did not need oxygen on discharge after desaturation screen and stabilized  Unclear etiology may need testing as an outpatient given albuterol as well as spacer and discharge does not need any mucolytic  Acute kidney injury with metabolic acidosis--previous creatinine 1.6, 04/2015 Hypokalemia 2.8-->4.3             Nephrology consulted, was on bicarbonate earlier in admission-stopped on 10/22             Creatinine 3.4---->3.1 -->2.75--->2.35 on discharge             Non-oliguric at present and producing more urine over past 24 hours--since 10/21 ~ 4 liters neg acidosis has essentially resolved  May need follow-up  Mild hypotension             Unclear how it was measured she is not symptomatic at present  Autoimmune hepatitis, thrombocytopenia  bilirubin from 3.9-2.3             Previously on mercaptopurine-dose of prednisone recently chart from 20-10-continuing 10 mg on discharge             Last LFTs 10/21 alkaline phosphatase 260 AST 104 bilirubin 3.9--- repeat labs showed a consistent and downward trend and bilirubin to 1.7 from 3-4 range on admission             Needs  outpatient follow-up [facility]  Diabetes mellitus type 2             Blood sugar reasonably controlled during hospital stay             Lantus 30 units at bedtime, sliding scale resistant coverage             Sugars were controlled in the 100-250 range during this hospital stay  On discharge resuming home medications  Hypothyroidism             Continue levothyroxine 150 g a.m.             Needs TSH in about one month  STUDIES:  CT head 10/17>>> neg acute  CT abd/pelvis 10/18>>>1. Mild right kidney perinephric stranding may represent underlying infection or inflammation of the renal collecting system. No hydronephrosis. Right kidney interpolar punctate nonobstructive nephrolithiasis. Cholelithiasis. Aortic atherosclerosis. Sigmoid diverticulosis.  Renal US 10/21 > right  renal cysts, no hydro.  ANTIBIOTICS Vancomycin 10/18 >> 10/19 Zosyn 10/18 >> 10/21 Ceftriaxone 10/21 > (stop date 11/4) Ceftin 10/23-->10/27 enddate  Discharge Exam: Vitals:   07/03/17 0423 07/03/17 0841  BP: 139/77   Pulse: 84   Resp:    Temp: 97.9 F (36.6 C)   SpO2: 97% 95%    Alert pleasant oriented just finished having a walker no new issues Eating drinking without any complaint Still having mild cough Abdomen soft no rebound no guarding   Discharge Instructions   Discharge Instructions    Diet - low sodium heart healthy    Complete by:  As directed    Increase activity slowly    Complete by:  As directed      Current Discharge Medication List    START taking these medications   Details  albuterol (PROVENTIL HFA;VENTOLIN HFA) 108 (90 Base) MCG/ACT inhaler Inhale 2 puffs into the lungs every 6 (six) hours as needed for wheezing or shortness of breath. Qty: 1 Inhaler, Refills: 2    cefUROXime (CEFTIN) 500 MG tablet Take 1 tablet (500 mg total) by mouth daily with breakfast. Qty: 6 tablet, Refills: 0    Spacer/Aero-Holding Chambers (BREATHERITE COLL SPACER ADULT) MISC 1 Device by Does not apply route every 6 (six) hours as needed. Qty: 1 each, Refills: 0      CONTINUE these medications which have NOT CHANGED   Details  aspirin 325 MG tablet Take 325 mg by mouth daily.    carvedilol (COREG) 6.25 MG tablet Take 6.25 mg by mouth 2 (two) times daily with a meal.    cyclobenzaprine (FLEXERIL) 10 MG tablet Take 10 mg by mouth 3 (three) times daily as needed for muscle spasms.    insulin aspart (NOVOLOG) 100 UNIT/ML injection Before each meal 3 times a day, 120-150 - 2 units, 151-200 - 4 units, 201-250 - 8 units, 251-300 - 10 units,  301 or above 10 units and call your MD Qty: 1 vial, Refills: 1    insulin glargine (LANTUS) 100 UNIT/ML injection Take 30 Units in the morning and 10 units at Night. Qty: 10 mL, Refills: 5    levothyroxine (SYNTHROID, LEVOTHROID)  150 MCG tablet Take 1 tablet (150 mcg total) by mouth daily before breakfast. Qty: 30 tablet, Refills: 2    predniSONE (DELTASONE) 20 MG tablet Take 2 tablets (40 mg total) by mouth daily with breakfast.      STOP taking these medications     naproxen (NAPROSYN) 500 MG tablet  Allergies  Allergen Reactions  . Codeine Nausea And Vomiting      The results of significant diagnostics from this hospitalization (including imaging, microbiology, ancillary and laboratory) are listed below for reference.    Significant Diagnostic Studies: Ct Abdomen Pelvis Wo Contrast  Result Date: 06/27/2017 CLINICAL DATA:  61 y/o  F; abdominal pain. EXAM: CT ABDOMEN AND PELVIS WITHOUT CONTRAST TECHNIQUE: Multidetector CT imaging of the abdomen and pelvis was performed following the standard protocol without IV contrast. COMPARISON:  12/03/2011 CT of abdomen and pelvis. FINDINGS: Lower chest: No acute abnormality. Hepatobiliary: No focal liver lesion. Cholelithiasis. No intra or extrahepatic biliary ductal dilatation. Pancreas: Unremarkable. No pancreatic ductal dilatation or surrounding inflammatory changes. Spleen: Normal in size without focal abnormality. Adrenals/Urinary Tract: Normal adrenal glands. Multiple lucent foci are present within the right kidney with fluid attenuation, well-circumscribed, compatible with cysts. The largest cyst is in the interpolar region measuring 4 cm. Similar subcentimeter focus is present in left interpolar kidney. No hydronephrosis. Punctate densities in right interpolar kidney (series 6, image 81) are compatible with nephrolithiasis. Normal bladder. Mild right kidney perinephric stranding. Stomach/Bowel: Stomach is within normal limits. Sigmoid diverticulosis without findings of acute diverticulitis. No evidence of bowel wall thickening, distention, or inflammatory changes. Vascular/Lymphatic: Aortic atherosclerosis. No enlarged abdominal or pelvic lymph nodes.  Reproductive: Uterus and bilateral adnexa are unremarkable. Other: No abdominal wall hernia or abnormality. No abdominopelvic ascites. Musculoskeletal: Right lower anterior abdominal wall nodular foci likely representing injection sites. IMPRESSION: 1. Mild right kidney perinephric stranding may represent underlying infection or inflammation of the renal collecting system. No hydronephrosis. 2. Right kidney interpolar punctate nonobstructive nephrolithiasis. 3. Cholelithiasis. 4. Aortic atherosclerosis. 5. Sigmoid diverticulosis. Electronically Signed   By: Mitzi Hansen M.D.   On: 06/27/2017 06:08   Dg Chest 1 View  Result Date: 06/29/2017 CLINICAL DATA:  Dyspnea EXAM: CHEST 1 VIEW COMPARISON:  06/28/2017 FINDINGS: Cardiac shadow remains enlarged. Persistent bibasilar atelectatic changes are noted. Mild vascular congestion is seen new from the prior exam. No acute bony abnormality is noted. IMPRESSION: Stable bibasilar atelectasis. Mild increase in vascular congestion when compare with the prior exam. Electronically Signed   By: Alcide Clever M.D.   On: 06/29/2017 06:59   Ct Head Wo Contrast  Result Date: 06/26/2017 CLINICAL DATA:  Difficulty walking with slurred speech EXAM: CT HEAD WITHOUT CONTRAST TECHNIQUE: Contiguous axial images were obtained from the base of the skull through the vertex without intravenous contrast. COMPARISON:  12/02/2011 FINDINGS: Brain: No acute territorial infarction, hemorrhage or intracranial mass is visualized. The ventricles are nonenlarged. Vascular: No hyperdense vessels. Scattered calcifications at the carotid siphons Skull: Normal. Negative for fracture or focal lesion. Sinuses/Orbits: No acute finding. Other: None IMPRESSION: No CT evidence for acute intracranial abnormality. Electronically Signed   By: Jasmine Pang M.D.   On: 06/26/2017 22:21   US Renal  Result Date: 06/30/2017 CLINICAL DATA:  Acute renal injury. EXAM: RENAL / URINARY TRACT ULTRASOUND  COMPLETE COMPARISON:  CT scan June 27, 2017 FINDINGS: Right Kidney: Length: 14 cm. Cysts are seen in the right kidney with the largest measuring 3.7 cm. No hydronephrosis, stones, or solid mass. Left Kidney: Length: 12.1 cm. Echogenicity within normal limits. No mass or hydronephrosis visualized. Bladder: Decompressed with a Foley catheter. IMPRESSION: 1. Right renal cysts are noted. No hydronephrosis, stones, or solid masses seen in either kidney. Electronically Signed   By: Gerome Sam III M.D   On: 06/30/2017 12:41   Dg Chest Port 1 [address]  Result Date: 06/28/2017 CLINICAL DATA:  Shortness of breath. EXAM: PORTABLE CHEST 1 VIEW COMPARISON:  06/27/2017. FINDINGS: Stable cardiomegaly. Bibasilar atelectasis again noted. No interim change. No pleural effusion or pneumothorax . IMPRESSION: 1. Stable cardiomegaly. 2. Bibasilar atelectasis again noted without interim change. Electronically Signed   By: Maisie Fus  Register   On: 06/28/2017 06:51   Dg Chest Port 1 View  Result Date: 06/27/2017 CLINICAL DATA:  61 y/o  F; shortness of breath. EXAM: PORTABLE CHEST 1 VIEW COMPARISON:  None. FINDINGS: Mildly enlarged cardiac silhouette given projection and technique. Pulmonary vascular congestion streaky lung base opacities which probably represent atelectasis. No pleural effusion or pneumothorax. Bones are unremarkable IMPRESSION: Enlarged cardiac silhouette. Pulmonary vascular congestion. Streaky lung base opacities probably representing atelectasis. Electronically Signed   By: Mitzi Hansen M.D.   On: 06/27/2017 03:48    Microbiology: Recent Results (from the past 240 hour(s))  Blood Culture (routine x 2)     Status: Abnormal   Collection Time: 06/27/17  4:20 AM  Result Value Ref Range Status   Specimen Description BLOOD LEFT ARM  Final   Special Requests   Final    BOTTLES DRAWN AEROBIC ONLY Blood Culture adequate volume   Culture  Setup Time   Final    GRAM NEGATIVE RODS AEROBIC BOTTLE  ONLY CRITICAL RESULT CALLED TO, READ BACK BY AND VERIFIED WITH: ADaphane Shepherd Pharm.D. 17:15 06/27/17 ([email or web address])    Culture ESCHERICHIA COLI (A)  Final   Report Status 06/29/2017 FINAL  Final   Organism ID, Bacteria ESCHERICHIA COLI  Final      Susceptibility   Escherichia coli - MIC*    AMPICILLIN 4 SENSITIVE Sensitive     CEFAZOLIN <=4 SENSITIVE Sensitive     CEFEPIME <=1 SENSITIVE Sensitive     CEFTAZIDIME <=1 SENSITIVE Sensitive     CEFTRIAXONE <=1 SENSITIVE Sensitive     CIPROFLOXACIN <=0.25 SENSITIVE Sensitive     GENTAMICIN <=1 SENSITIVE Sensitive     IMIPENEM <=0.25 SENSITIVE Sensitive     TRIMETH/SULFA <=20 SENSITIVE Sensitive     AMPICILLIN/SULBACTAM <=2 SENSITIVE Sensitive     PIP/TAZO <=4 SENSITIVE Sensitive     Extended ESBL NEGATIVE Sensitive     * ESCHERICHIA COLI  Blood Culture ID Panel (Reflexed)     Status: Abnormal   Collection Time: 06/27/17  4:20 AM  Result Value Ref Range Status   Enterococcus species NOT DETECTED NOT DETECTED Final   Listeria monocytogenes NOT DETECTED NOT DETECTED Final   Staphylococcus species NOT DETECTED NOT DETECTED Final   Staphylococcus aureus NOT DETECTED NOT DETECTED Final   Streptococcus species NOT DETECTED NOT DETECTED Final   Streptococcus agalactiae NOT DETECTED NOT DETECTED Final   Streptococcus pneumoniae NOT DETECTED NOT DETECTED Final   Streptococcus pyogenes NOT DETECTED NOT DETECTED Final   Acinetobacter baumannii NOT DETECTED NOT DETECTED Final   Enterobacteriaceae species DETECTED (A) NOT DETECTED Final    Comment: Enterobacteriaceae represent a large family of gram-negative bacteria, not a single organism. CRITICAL RESULT CALLED TO, READ BACK BY AND VERIFIED WITH: ADaphane Shepherd Pharm.D. 17:15 06/27/17 (wilsonm)    Enterobacter cloacae complex NOT DETECTED NOT DETECTED Final   Escherichia coli DETECTED (A) NOT DETECTED Final    Comment: CRITICAL RESULT CALLED TO, READ BACK BY AND VERIFIED WITH: ADaphane Shepherd Pharm.D. 17:15  06/27/17 (wilsonm)    Klebsiella oxytoca NOT DETECTED NOT DETECTED Final   Klebsiella pneumoniae NOT DETECTED NOT DETECTED Final   Proteus species NOT DETECTED NOT DETECTED Final  Serratia marcescens NOT DETECTED NOT DETECTED Final   Carbapenem resistance NOT DETECTED NOT DETECTED Final   Haemophilus influenzae NOT DETECTED NOT DETECTED Final   Neisseria meningitidis NOT DETECTED NOT DETECTED Final   Pseudomonas aeruginosa NOT DETECTED NOT DETECTED Final   Candida albicans NOT DETECTED NOT DETECTED Final   Candida glabrata NOT DETECTED NOT DETECTED Final   Candida krusei NOT DETECTED NOT DETECTED Final   Candida parapsilosis NOT DETECTED NOT DETECTED Final   Candida tropicalis NOT DETECTED NOT DETECTED Final  Blood Culture (routine x 2)     Status: Abnormal   Collection Time: 06/27/17  4:28 AM  Result Value Ref Range Status   Specimen Description BLOOD RIGHT ARM  Final   Special Requests IN PEDIATRIC BOTTLE Blood Culture adequate volume  Final   Culture  Setup Time   Final    GRAM NEGATIVE RODS IN PEDIATRIC BOTTLE CRITICAL VALUE NOTED.  VALUE IS CONSISTENT WITH PREVIOUSLY REPORTED AND CALLED VALUE.    Culture (A)  Final    ESCHERICHIA COLI SUSCEPTIBILITIES PERFORMED ON PREVIOUS CULTURE WITHIN THE LAST 5 DAYS.    Report Status 06/29/2017 FINAL  Final  Urine culture     Status: Abnormal   Collection Time: 06/27/17  5:18 AM  Result Value Ref Range Status   Specimen Description URINE, CLEAN CATCH  Final   Special Requests NONE  Final   Culture >=100,000 COLONIES/mL ESCHERICHIA COLI (A)  Final   Report Status 06/29/2017 FINAL  Final   Organism ID, Bacteria ESCHERICHIA COLI (A)  Final      Susceptibility   Escherichia coli - MIC*    AMPICILLIN 4 SENSITIVE Sensitive     CEFAZOLIN <=4 SENSITIVE Sensitive     CEFTRIAXONE <=1 SENSITIVE Sensitive     CIPROFLOXACIN <=0.25 SENSITIVE Sensitive     GENTAMICIN <=1 SENSITIVE Sensitive     IMIPENEM <=0.25 SENSITIVE Sensitive      NITROFURANTOIN <=16 SENSITIVE Sensitive     TRIMETH/SULFA <=20 SENSITIVE Sensitive     AMPICILLIN/SULBACTAM <=2 SENSITIVE Sensitive     PIP/TAZO <=4 SENSITIVE Sensitive     Extended ESBL NEGATIVE Sensitive     * >=100,000 COLONIES/mL ESCHERICHIA COLI  MRSA PCR Screening     Status: None   Collection Time: 06/27/17 11:34 AM  Result Value Ref Range Status   MRSA by PCR NEGATIVE NEGATIVE Final    Comment:        The GeneXpert MRSA Assay (FDA approved for NASAL specimens only), is one component of a comprehensive MRSA colonization surveillance program. It is not intended to diagnose MRSA infection nor to guide or monitor treatment for MRSA infections.      Labs: Basic Metabolic Panel:  Recent Labs Lab 06/28/17 0425  06/29/17 1610  06/30/17 0257 07/01/17 0353 07/01/17 1215 07/02/17 0821 07/03/17 0323  NA 136  < > 132*  < > 141 135 139 140 137  K 4.0  < > 6.3*  < > 3.2* 2.8* 4.3 3.5 3.4*  CL 106  < > 108  < > 110 107 109 110 106  CO2 17*  < > 14*  < > 19* 24 22 23  21*  GLUCOSE 102*  < > 203*  < > 109* 116* 227* 179* 215*  BUN 60*  < > 82*  < > 92* 87* 84* 69* 57*  CREATININE 3.49*  < > 3.59*  < > 3.73* 3.41* 3.17* 2.75* 2.35*  CALCIUM 7.1*  < > 6.5*  < > 7.0* 6.5* 6.9*  7.1* 7.2*  MG 1.3*  --  2.6*  --   --  2.6*  --  2.6*  --   PHOS 2.2*  --  3.6  --   --  4.3  --  3.7  --   < > = values in this interval not displayed. Liver Function Tests:  Recent Labs Lab 06/26/17 2156 06/29/17 2123 06/30/17 1234 07/01/17 0353 07/03/17 0323  AST 44*  --  104*  --  63*  ALT 49  --  49  --  41  ALKPHOS 279*  --  260*  --  313*  BILITOT 3.6*  --  3.9* 2.3* 1.7*  PROT 8.1  --  6.8  --  7.7  ALBUMIN 2.0* 1.3* 1.2*  --  1.5*   No results for input(s): LIPASE, AMYLASE in the last 168 hours.  Recent Labs Lab 06/27/17 0357 06/30/17 1119  AMMONIA 37* 20   CBC:  Recent Labs Lab 06/26/17 2156  06/29/17 0627 06/30/17 0257 07/01/17 0353 07/02/17 0821 07/03/17 0323  WBC  13.9*  < > 30.7* 24.2* 20.5* 22.6* 25.8*  NEUTROABS 12.6*  --   --   --   --   --  19.3*  HGB 12.2  < > 11.8* 12.3 10.0* 9.9* 9.6*  HCT 34.4*  < > 33.3* 33.3* 27.9* 28.0* 27.2*  MCV 86.0  < > 86.0 83.7 84.3 84.3 85.8  PLT 55*  < > 31* 34* 41* 48* 81*  < > = values in this interval not displayed. Cardiac Enzymes:  Recent Labs Lab 06/27/17 0357 06/27/17 1003 06/27/17 1548 06/27/17 2101  CKTOTAL 16*  --   --   --   TROPONINI  --  0.04* 0.05* 0.07*   BNP: BNP (last 3 results)  Recent Labs  06/27/17 1003  BNP 82.4    ProBNP (last 3 results) No results for input(s): PROBNP in the last 8760 hours.  CBG:  Recent Labs Lab 07/02/17 0739 07/02/17 1251 07/02/17 1714 07/02/17 2117 07/03/17 0809  GLUCAP 159* 228* 297* 253* 216*       Signed:  Rhetta MuraSAMTANI, JAI-GURMUKH MD   [facility] 07/03/2017, 10:10 AM

## 2017-07-03 NOTE — Care Management Note (Addendum)
Case Management Note  Patient Details  Name: Darene LamerGloria W Argyle MRN: [id number] Date of Birth: [birth year]  Subjective/Objective:                    Action/Plan: [facility] is not in network with [employer]. Patient and sister aware. They would like [facility]. [facility]  and [facility] . [facility], [facility] [place] [facility] . She will check to see if patient's insurance is in network and call NCM back. Patient and her sister are aware.  Discussed home health and DME needs. Confirmed face sheet information. Patient not needing home oxygen at present. She does not want bedside commode, she does [facility]   Patient has list of home health agencies at bedside from yesterday . She will discuss with her sister and then decide on agency . Will continue to follow. Expected Discharge Date:  07/03/17               Expected Discharge Plan:  Home w Home Health Services  In-House Referral:     Discharge planning Services  CM Consult  Post Acute Care Choice:  Durable Medical Equipment, Home Health Choice offered to:  Patient, Sibling  DME Arranged:  Walker rolling DME Agency:  [facility]  [facility]     Status of Service:  In process, will continue to follow  If discussed at Long Length of Stay Meetings, dates discussed:    Additional Comments:  Kingsley PlanWile, Kalyani Maeda Marie, RN 07/03/2017, 10:16 AM

## 2017-08-13 ENCOUNTER — Other Ambulatory Visit: Payer: Self-pay | Admitting: Family Medicine

## 2017-08-13 DIAGNOSIS — Z139 Encounter for screening, unspecified: Secondary | ICD-10-CM

## 2017-09-19 ENCOUNTER — Ambulatory Visit
Admission: RE | Admit: 2017-09-19 | Discharge: 2017-09-19 | Disposition: A | Discharge status: Home / Self Care | Payer: 59 | Source: Ambulatory Visit | Attending: Family Medicine | Admitting: Family Medicine

## 2017-09-19 DIAGNOSIS — Z139 Encounter for screening, unspecified: Secondary | ICD-10-CM

## 2017-11-12 ENCOUNTER — Inpatient Hospital Stay (HOSPITAL_COMMUNITY)
Admission: EM | Admit: 2017-11-12 | Discharge: 2017-11-17 | DRG: 871 | Disposition: A | Discharge status: Home / Self Care | Payer: 59 | Attending: Family Medicine | Admitting: Family Medicine

## 2017-11-12 ENCOUNTER — Ambulatory Visit
Admission: RE | Admit: 2017-11-12 | Discharge: 2017-11-12 | Disposition: A | Discharge status: Home / Self Care | Payer: 59 | Source: Ambulatory Visit | Attending: Physician Assistant | Admitting: Physician Assistant

## 2017-11-12 ENCOUNTER — Other Ambulatory Visit: Payer: Self-pay | Admitting: Physician Assistant

## 2017-11-12 ENCOUNTER — Encounter (HOSPITAL_COMMUNITY): Payer: Self-pay | Admitting: Emergency Medicine

## 2017-11-12 ENCOUNTER — Other Ambulatory Visit: Payer: Self-pay

## 2017-11-12 ENCOUNTER — Inpatient Hospital Stay (HOSPITAL_COMMUNITY): Payer: 59

## 2017-11-12 DIAGNOSIS — R05 Cough: Secondary | ICD-10-CM | POA: Diagnosis present

## 2017-11-12 DIAGNOSIS — Z794 Long term (current) use of insulin: Secondary | ICD-10-CM | POA: Diagnosis not present

## 2017-11-12 DIAGNOSIS — N12 Tubulo-interstitial nephritis, not specified as acute or chronic: Secondary | ICD-10-CM

## 2017-11-12 DIAGNOSIS — Z7952 Long term (current) use of systemic steroids: Secondary | ICD-10-CM

## 2017-11-12 DIAGNOSIS — N10 Acute tubulo-interstitial nephritis: Secondary | ICD-10-CM

## 2017-11-12 DIAGNOSIS — K5900 Constipation, unspecified: Secondary | ICD-10-CM | POA: Diagnosis not present

## 2017-11-12 DIAGNOSIS — Z79899 Other long term (current) drug therapy: Secondary | ICD-10-CM

## 2017-11-12 DIAGNOSIS — N281 Cyst of kidney, acquired: Secondary | ICD-10-CM | POA: Diagnosis present

## 2017-11-12 DIAGNOSIS — E1165 Type 2 diabetes mellitus with hyperglycemia: Secondary | ICD-10-CM

## 2017-11-12 DIAGNOSIS — A419 Sepsis, unspecified organism: Secondary | ICD-10-CM

## 2017-11-12 DIAGNOSIS — T380X5A Adverse effect of glucocorticoids and synthetic analogues, initial encounter: Secondary | ICD-10-CM | POA: Diagnosis present

## 2017-11-12 DIAGNOSIS — R059 Cough, unspecified: Secondary | ICD-10-CM

## 2017-11-12 DIAGNOSIS — R1031 Right lower quadrant pain: Secondary | ICD-10-CM

## 2017-11-12 DIAGNOSIS — K7460 Unspecified cirrhosis of liver: Secondary | ICD-10-CM | POA: Diagnosis present

## 2017-11-12 DIAGNOSIS — N179 Acute kidney failure, unspecified: Secondary | ICD-10-CM | POA: Diagnosis present

## 2017-11-12 DIAGNOSIS — A4151 Sepsis due to Escherichia coli [E. coli]: Secondary | ICD-10-CM | POA: Diagnosis present

## 2017-11-12 DIAGNOSIS — K6812 Psoas muscle abscess: Secondary | ICD-10-CM

## 2017-11-12 DIAGNOSIS — I10 Essential (primary) hypertension: Secondary | ICD-10-CM | POA: Diagnosis present

## 2017-11-12 DIAGNOSIS — N151 Renal and perinephric abscess: Secondary | ICD-10-CM | POA: Diagnosis not present

## 2017-11-12 DIAGNOSIS — Z841 Family history of disorders of kidney and ureter: Secondary | ICD-10-CM

## 2017-11-12 DIAGNOSIS — Z87442 Personal history of urinary calculi: Secondary | ICD-10-CM

## 2017-11-12 DIAGNOSIS — L0291 Cutaneous abscess, unspecified: Secondary | ICD-10-CM | POA: Diagnosis not present

## 2017-11-12 DIAGNOSIS — K8020 Calculus of gallbladder without cholecystitis without obstruction: Secondary | ICD-10-CM | POA: Diagnosis present

## 2017-11-12 DIAGNOSIS — K754 Autoimmune hepatitis: Secondary | ICD-10-CM | POA: Diagnosis not present

## 2017-11-12 DIAGNOSIS — Z8249 Family history of ischemic heart disease and other diseases of the circulatory system: Secondary | ICD-10-CM

## 2017-11-12 DIAGNOSIS — N390 Urinary tract infection, site not specified: Secondary | ICD-10-CM

## 2017-11-12 DIAGNOSIS — E7800 Pure hypercholesterolemia, unspecified: Secondary | ICD-10-CM | POA: Diagnosis present

## 2017-11-12 DIAGNOSIS — IMO0001 Reserved for inherently not codable concepts without codable children: Secondary | ICD-10-CM | POA: Diagnosis present

## 2017-11-12 DIAGNOSIS — R109 Unspecified abdominal pain: Secondary | ICD-10-CM

## 2017-11-12 DIAGNOSIS — IMO0002 Reserved for concepts with insufficient information to code with codable children: Secondary | ICD-10-CM | POA: Diagnosis present

## 2017-11-12 LAB — URINALYSIS, ROUTINE W REFLEX MICROSCOPIC
Bilirubin Urine: NEGATIVE
Glucose, UA: >=500 mg/dL — AB
Ketones, ur: NEGATIVE mg/dL
Nitrite: POSITIVE — AB
Protein, ur: NEGATIVE mg/dL
Specific Gravity, Urine: >1.046 — ABNORMAL HIGH (ref 1.005–1.030)
pH: 6 (ref 5.0–8.0)

## 2017-11-12 LAB — COMPREHENSIVE METABOLIC PANEL
ALT: 33 U/L (ref 14–54)
ANION GAP: 9 (ref 5–15)
AST: 41 U/L (ref 15–41)
Albumin: 2.3 g/dL — ABNORMAL LOW (ref 3.5–5.0)
Alkaline Phosphatase: 191 U/L — ABNORMAL HIGH (ref 38–126)
BILIRUBIN TOTAL: 1.5 mg/dL — AB (ref 0.3–1.2)
BUN: 23 mg/dL — ABNORMAL HIGH (ref 6–20)
CO2: 25 mmol/L (ref 22–32)
Calcium: 9.1 mg/dL (ref 8.9–10.3)
Chloride: 98 mmol/L — ABNORMAL LOW (ref 101–111)
Creatinine, Ser: 1.39 mg/dL — ABNORMAL HIGH (ref 0.44–1.00)
GFR calc non Af Amer: 40 mL/min — ABNORMAL LOW (ref >60)
GFR, EST AFRICAN AMERICAN: 46 mL/min — AB (ref >60)
Glucose, Bld: 520 mg/dL (ref 65–99)
POTASSIUM: 5.7 mmol/L — AB (ref 3.5–5.1)
SODIUM: 132 mmol/L — AB (ref 135–145)
TOTAL PROTEIN: 8.8 g/dL — AB (ref 6.5–8.1)

## 2017-11-12 LAB — PROTIME-INR
INR: 1.27
Prothrombin Time: 15.8 s — ABNORMAL HIGH (ref 11.4–15.2)

## 2017-11-12 LAB — INFLUENZA PANEL BY PCR (TYPE A & B)
Influenza A By PCR: NEGATIVE
Influenza B By PCR: NEGATIVE

## 2017-11-12 LAB — CBC
HEMATOCRIT: 37 % (ref 36.0–46.0)
Hemoglobin: 12.7 g/dL (ref 12.0–15.0)
MCH: 29.7 pg (ref 26.0–34.0)
MCHC: 34.3 g/dL (ref 30.0–36.0)
MCV: 86.7 fL (ref 78.0–100.0)
Platelets: 297 10*3/uL (ref 150–400)
RBC: 4.27 MIL/uL (ref 3.87–5.11)
RDW: 14.5 % (ref 11.5–15.5)
WBC: 34.1 10*3/uL — ABNORMAL HIGH (ref 4.0–10.5)

## 2017-11-12 LAB — CBG MONITORING, ED
GLUCOSE-CAPILLARY: 459 mg/dL — AB (ref 65–99)
Glucose-Capillary: 429 mg/dL — ABNORMAL HIGH (ref 65–99)
Glucose-Capillary: 467 mg/dL — ABNORMAL HIGH (ref 65–99)
Glucose-Capillary: 468 mg/dL — ABNORMAL HIGH (ref 65–99)
Glucose-Capillary: 553 mg/dL (ref 65–99)

## 2017-11-12 LAB — TSH: TSH: 7.408 u[IU]/mL — ABNORMAL HIGH (ref 0.350–4.500)

## 2017-11-12 LAB — I-STAT CG4 LACTIC ACID, ED
LACTIC ACID, VENOUS: 2.91 mmol/L — AB (ref 0.5–1.9)
LACTIC ACID, VENOUS: 2.44 mmol/L — AB (ref 0.5–1.9)
LACTIC ACID, VENOUS: 3.03 mmol/L — AB (ref 0.5–1.9)
LACTIC ACID, VENOUS: 2.01 mmol/L — AB (ref 0.5–1.9)

## 2017-11-12 LAB — LACTIC ACID, PLASMA: Lactic Acid, Venous: 1.9 mmol/L (ref 0.5–1.9)

## 2017-11-12 MED ORDER — FENTANYL CITRATE (PF) 100 MCG/2ML IJ SOLN
50.0000 ug | Freq: Once | INTRAMUSCULAR | Status: AC
  Administered 2017-11-12: 50 ug via INTRAVENOUS
  Filled 2017-11-12: qty 2

## 2017-11-12 MED ORDER — VANCOMYCIN HCL IN DEXTROSE 750-5 MG/150ML-% IV SOLN
750.0000 mg | INTRAVENOUS | Status: DC
Start: 2017-11-13 — End: 2017-11-17
  Administered 2017-11-13 – 2017-11-16 (×4): 750 mg via INTRAVENOUS
  Filled 2017-11-12 (×6): qty 150

## 2017-11-12 MED ORDER — CLOTRIMAZOLE 1 % VA CREA
1.0000 | TOPICAL_CREAM | Freq: Every day | VAGINAL | Status: DC
  Administered 2017-11-13 – 2017-11-16 (×4): 1 via VAGINAL
  Filled 2017-11-12: qty 45

## 2017-11-12 MED ORDER — VITAMIN D (ERGOCALCIFEROL) 1.25 MG (50000 UNIT) PO CAPS
50000.0000 [IU] | ORAL_CAPSULE | ORAL | Status: DC
  Administered 2017-11-13: 50000 [IU] via ORAL
  Filled 2017-11-12: qty 1

## 2017-11-12 MED ORDER — LEVOTHYROXINE SODIUM 112 MCG PO TABS
112.0000 ug | ORAL_TABLET | Freq: Every day | ORAL | Status: DC
  Administered 2017-11-13 – 2017-11-17 (×5): 112 ug via ORAL
  Filled 2017-11-12 (×5): qty 1

## 2017-11-12 MED ORDER — VANCOMYCIN HCL 10 G IV SOLR
2500.0000 mg | Freq: Once | INTRAVENOUS | Status: AC
  Administered 2017-11-12: 2500 mg via INTRAVENOUS
  Filled 2017-11-12: qty 2000

## 2017-11-12 MED ORDER — INSULIN GLARGINE 100 UNIT/ML ~~LOC~~ SOLN
10.0000 [IU] | Freq: Every day | SUBCUTANEOUS | Status: DC
  Filled 2017-11-12: qty 0.1

## 2017-11-12 MED ORDER — EZETIMIBE 10 MG PO TABS
10.0000 mg | ORAL_TABLET | Freq: Every day | ORAL | Status: DC
  Administered 2017-11-12 – 2017-11-17 (×6): 10 mg via ORAL
  Filled 2017-11-12 (×6): qty 1

## 2017-11-12 MED ORDER — INSULIN ASPART 100 UNIT/ML ~~LOC~~ SOLN
11.0000 [IU] | Freq: Once | SUBCUTANEOUS | Status: AC
  Administered 2017-11-12: 11 [IU] via SUBCUTANEOUS
  Filled 2017-11-12: qty 1

## 2017-11-12 MED ORDER — FENTANYL CITRATE (PF) 100 MCG/2ML IJ SOLN
25.0000 ug | INTRAMUSCULAR | Status: DC | PRN
  Administered 2017-11-12 – 2017-11-13 (×2): 25 ug via INTRAVENOUS
  Filled 2017-11-12 (×2): qty 2

## 2017-11-12 MED ORDER — INSULIN GLARGINE 100 UNIT/ML ~~LOC~~ SOLN
10.0000 [IU] | Freq: Every day | SUBCUTANEOUS | Status: DC
  Administered 2017-11-12: 10 [IU] via SUBCUTANEOUS
  Filled 2017-11-12: qty 0.1

## 2017-11-12 MED ORDER — PIPERACILLIN-TAZOBACTAM 3.375 G IVPB 30 MIN
3.3750 g | Freq: Once | INTRAVENOUS | Status: AC
  Administered 2017-11-12: 3.375 g via INTRAVENOUS
  Filled 2017-11-12: qty 50

## 2017-11-12 MED ORDER — SODIUM CHLORIDE 0.9 % IV SOLN
2.0000 g | Freq: Two times a day (BID) | INTRAVENOUS | Status: AC
  Administered 2017-11-12 – 2017-11-16 (×9): 2 g via INTRAVENOUS
  Filled 2017-11-12 (×10): qty 2

## 2017-11-12 MED ORDER — INSULIN ASPART 100 UNIT/ML ~~LOC~~ SOLN
0.0000 [IU] | Freq: Three times a day (TID) | SUBCUTANEOUS | Status: DC
  Administered 2017-11-13 (×3): 5 [IU] via SUBCUTANEOUS
  Administered 2017-11-14: 7 [IU] via SUBCUTANEOUS

## 2017-11-12 MED ORDER — IOPAMIDOL (ISOVUE-300) INJECTION 61%
125.0000 mL | Freq: Once | INTRAVENOUS | Status: AC | PRN
  Administered 2017-11-12: 125 mL via INTRAVENOUS

## 2017-11-12 MED ORDER — ENOXAPARIN SODIUM 30 MG/0.3ML ~~LOC~~ SOLN: 30.0000 mg | SUBCUTANEOUS | Status: DC

## 2017-11-12 MED ORDER — HYDROCORTISONE NA SUCCINATE PF 100 MG IJ SOLR
50.0000 mg | Freq: Two times a day (BID) | INTRAMUSCULAR | Status: DC
  Administered 2017-11-12 – 2017-11-13 (×2): 50 mg via INTRAVENOUS
  Filled 2017-11-12 (×2): qty 2

## 2017-11-12 MED ORDER — INSULIN GLARGINE 100 UNIT/ML ~~LOC~~ SOLN
30.0000 [IU] | Freq: Every day | SUBCUTANEOUS | Status: DC
  Filled 2017-11-12: qty 0.3

## 2017-11-12 MED ORDER — ONDANSETRON HCL 4 MG/2ML IJ SOLN
4.0000 mg | Freq: Once | INTRAMUSCULAR | Status: AC
  Administered 2017-11-12: 4 mg via INTRAVENOUS
  Filled 2017-11-12: qty 2

## 2017-11-12 MED ORDER — SODIUM CHLORIDE 0.9 % IV SOLN
INTRAVENOUS | Status: DC
  Administered 2017-11-12: 18:00:00 via INTRAVENOUS

## 2017-11-12 MED ORDER — INSULIN GLARGINE 100 UNIT/ML ~~LOC~~ SOLN: 10.0000 [IU] | Freq: Once | SUBCUTANEOUS | Status: DC

## 2017-11-12 MED ORDER — FENTANYL CITRATE (PF) 100 MCG/2ML IJ SOLN
25.0000 ug | Freq: Once | INTRAMUSCULAR | Status: AC
  Administered 2017-11-12: 25 ug via INTRAVENOUS
  Filled 2017-11-12: qty 2

## 2017-11-12 MED ORDER — ACETAMINOPHEN 325 MG PO TABS
650.0000 mg | ORAL_TABLET | Freq: Four times a day (QID) | ORAL | Status: DC | PRN
  Administered 2017-11-15 – 2017-11-16 (×3): 650 mg via ORAL
  Filled 2017-11-12 (×3): qty 2

## 2017-11-12 MED ORDER — SODIUM CHLORIDE 0.9 % IV BOLUS (SEPSIS)
1000.0000 mL | Freq: Once | INTRAVENOUS | Status: AC
  Administered 2017-11-12: 1000 mL via INTRAVENOUS

## 2017-11-12 NOTE — ED Notes (Signed)
Blood cultures obtained from right AC at this time.

## 2017-11-12 NOTE — ED Notes (Signed)
Pt's CBG=[id number]

## 2017-11-12 NOTE — H&P (Signed)
[facility] History and Physical  Darene LamerGloria W Quayle ZOX:[id number] DOB: 12-Apr-1956 DOA: 11/12/2017  Referring physician:  PCP: Maurice SmallGriffin, Elaine, MD  Specialists:   Chief Complaint: Right flank pain   HPI: Darene LamerGloria W Crisp is a 62 y.o. female with PMH of HTN, DM, Nephrolithiasis, Autoimmune hepatitis (on prednisone), CKD, E coli-UTI and Sepsis (06/2017) presented with progressively worsening right flank pain. Patient states that she has developed progressively worsening right flank pain with mild radiation to her right leg for two weeks. She reports that she was unable to walk well and went to see her primary care physician. Ct san was obtained today which showed right pyelonephritis and psoas abscess. She reports associated night sweats, nausea, few episodes of vomiting. She denies acute abdominal pains, no hematemesis, no hematochezia. She denies dysuria, no change in urine color, no hematuria. She reports recent upper respiratory symptoms with rhinorrhea, no acute shortness of breath, no chest pains. She denies focal neuro symptoms, but she has chronic left leg numbness. She reports chronic steroid use for autoimmune hepatitis, recently  restarted on 10 gm daily.  -ED: leukocytosis. CT showed right pyelo with psoas abscess. ED d/w urology who recommended IR drainage. hospitalist is called for admission    Review of Systems: The patient denies anorexia, fever, weight loss,, vision loss, decreased hearing, hoarseness, chest pain, syncope, dyspnea on exertion, peripheral edema, balance deficits, hemoptysis, abdominal pain, melena, hematochezia, severe indigestion/heartburn, hematuria, incontinence, genital sores, muscle weakness, suspicious skin lesions, transient blindness, difficulty walking, depression, unusual weight change, abnormal bleeding, enlarged lymph nodes, angioedema, and breast masses.    Past Medical History:  Diagnosis Date  . Hypercholesterolemia   . Hypertension   . Kidney  stones   . Type II diabetes mellitus (HCC)    Past Surgical History:  Procedure Laterality Date  . BREAST EXCISIONAL BIOPSY    . CESAREAN SECTION  1999  . DILATION AND CURETTAGE OF UTERUS     "I lost a baby"  . TUBAL LIGATION  1999   Social History:  reports that  has never smoked. she has never used smokeless tobacco. She reports that she does not drink alcohol or use drugs. Home;  where does patient live--home, ALF, SNF? and with whom if at home? Yes;  Can patient participate in ADLs?  Allergies  Allergen Reactions  . Codeine Nausea And Vomiting    Family History  Problem Relation Age of Onset  . Kidney failure Father   . Hypertension Father     (be sure to complete)  Prior to Admission medications   Medication Sig Start Date End Date Taking? Authorizing Provider  albuterol (PROVENTIL HFA;VENTOLIN HFA) 108 (90 Base) MCG/ACT inhaler Inhale 2 puffs into the lungs every 6 (six) hours as needed for wheezing or shortness of breath. 07/03/17  Yes Rhetta MuraSamtani, Jai-Gurmukh, MD  amLODipine (NORVASC) 10 MG tablet Take 10 mg by mouth daily.   Yes [provider]  carvedilol (COREG) 6.25 MG tablet Take 6.25 mg by mouth 2 (two) times daily with a meal.   Yes [provider]  ezetimibe (ZETIA) 10 MG tablet Take 10 mg by mouth daily.   Yes [provider]  insulin aspart (NOVOLOG) 100 UNIT/ML injection Before each meal 3 times a day, 120-150 - 2 units, 151-200 - 4 units, 201-250 - 8 units, 251-300 - 10 units,  301 or above 10 units and call your MD Patient taking differently: Inject 10 Units into the skin 2 (two) times daily.  12/10/11  Yes  Leroy Sea, MD  Insulin Detemir (LEVEMIR FLEXPEN [place]) Inject 28 Units into the skin at bedtime.   Yes [provider]  insulin glargine (LANTUS) 100 UNIT/ML injection Take 30 Units in the morning and 10 units at Night. Patient taking differently: Inject 10 Units into the skin 2 (two) times daily.  04/13/15  Yes Osvaldo Shipper, MD  levothyroxine (SYNTHROID, LEVOTHROID) 150 MCG tablet Take 1 tablet (150 mcg total) by mouth daily before breakfast. Patient taking differently: Take 112 mcg by mouth daily before breakfast.  04/13/15  Yes Osvaldo Shipper, MD  losartan (COZAAR) 100 MG tablet Take 100 mg by mouth daily.   Yes [provider]  mercaptopurine (PURINETHOL) 50 MG tablet Take 150 mg by mouth daily. Give on an empty stomach 1 hour before or 2 hours after meals. Caution: Chemotherapy.   Yes [provider]  naproxen sodium (ALEVE) 220 MG tablet Take 220 mg by mouth daily as needed (PAIN).   Yes [provider]  predniSONE (DELTASONE) 20 MG tablet Take 2 tablets (40 mg total) by mouth daily with breakfast. Patient taking differently: Take 10 mg by mouth daily with breakfast.  04/13/15  Yes Osvaldo Shipper, MD  terconazole (TERAZOL 7) 0.4 % vaginal cream Place 1 applicator vaginally daily. 10/21/17  Yes [provider]  Vitamin D, Ergocalciferol, (DRISDOL) 50000 units CAPS capsule Take 50,000 Units by mouth every 7 (seven) days.   Yes [provider]  cefUROXime (CEFTIN) 500 MG tablet Take 1 tablet (500 mg total) by mouth daily with breakfast. Patient not taking: Reported on 11/12/2017 07/03/17   Rhetta Mura, MD  Spacer/Aero-Holding Chambers (BREATHERITE COLL SPACER ADULT) MISC 1 Device by Does not apply route every 6 (six) hours as needed. 07/03/17   Rhetta Mura, MD   Physical Exam: Vitals:   11/12/17 1402 11/12/17 1420  BP: 126/79 122/81  Pulse: (!) 104 (!) 105  Resp: (!) 24 (!) 29  Temp: 97.9 F (36.6 C)   SpO2: 96% 95%     General:  Alert. Mild distress due to pain   Eyes: eom-i  ENT: no oral ulcers   Neck: supple, no JVD  Cardiovascular: s1,s2 rrr  Respiratory: few crackles LL  Abdomen: soft, nt, nd   Skin: no rash   Musculoskeletal: no pedal edema   Psychiatric: no hallucinations   Neurologic: CN 2-12 intact. Motor 5/5 BL  symmetric   Labs on Admission:  Basic Metabolic Panel: No results for input(s): NA, K, CL, CO2, GLUCOSE, BUN, CREATININE, CALCIUM, MG, PHOS in the last 168 hours. Liver Function Tests: No results for input(s): AST, ALT, ALKPHOS, BILITOT, PROT, ALBUMIN in the last 168 hours. No results for input(s): LIPASE, AMYLASE in the last 168 hours. No results for input(s): AMMONIA in the last 168 hours. CBC: No results for input(s): WBC, NEUTROABS, HGB, HCT, MCV, PLT in the last 168 hours. Cardiac Enzymes: No results for input(s): CKTOTAL, CKMB, CKMBINDEX, TROPONINI in the last 168 hours.  BNP (last 3 results) Recent Labs    06/27/17 1003  BNP 82.4    ProBNP (last 3 results) No results for input(s): PROBNP in the last 8760 hours.  CBG: Recent Labs  Lab 11/12/17 1605  GLUCAP 553*    Radiological Exams on Admission: Ct Abdomen Pelvis W Contrast  Result Date: 11/12/2017 CLINICAL DATA:  Right upper quadrant pain x2 weeks, melena, history of renal stones. EXAM: CT ABDOMEN AND PELVIS WITH CONTRAST TECHNIQUE: Multidetector CT imaging of the abdomen and pelvis was  performed using the standard protocol following bolus administration of intravenous contrast. CONTRAST:  ISOVUE-300 IOPAMIDOL (ISOVUE-300) INJECTION 61% COMPARISON:  06/27/2017 FINDINGS: Lower chest: Lung bases are clear. Hepatobiliary: Nodular hepatic contour, suggesting cirrhosis. No focal hepatic lesion is seen. Layering 6 mm gallstone (series 2/image 42), without associated inflammatory changes. No intrahepatic or extrahepatic ductal dilatation. Pancreas: Within normal limits. Spleen: Spleen is normal in size. Adrenals/Urinary Tract: 1.7 cm right adrenal nodule (series 2/image 26; sagittal image 82), indeterminate, although favored to be present in 2013, suggesting a benign adrenal adenoma. Left adrenal gland is within normal limits. Multiple bilateral renal cysts, measuring up to 5.2 cm in the anterior interpolar right kidney  (series 2/image 36). No hydronephrosis. Abnormal soft tissue/inflammation with heterogeneous enhancement of the right lower kidney (series 2/image 44). Secondary involvement of the right psoas muscle with associated centrally necrotic 3.8 x 5.7 x 6.6 cm lesion in the right psoas (series 2/image 47). Associated fluid/gas in the right retroperitoneum/posterior pararenal space, measuring up to 5.9 x 4.9 x 6.0 cm (series 2/image 54), although this does not reflect a well-defined fluid collection. Overall, given this constellation of findings, this appearance favors pyelonephritis with associated psoas abscess and gas-forming infection extending into the retroperitoneum/posterior pararenal space. Technically speaking, some of this abnormal soft tissue and the right psoas abnormality could reflect tumor, but this would not account for the associated soft tissue inflammation/gas. Bladder is within normal limits. Stomach/Bowel: Stomach is within normal limits. No evidence of bowel obstruction. Normal appendix (series 2/image 62). Notably, the ascending colon/cecum and appendix are remote from the fluid/gas in the posterior pararenal space. Vascular/Lymphatic: No evidence of abdominal aortic aneurysm. Atherosclerotic calcifications of the abdominal aorta and branch vessels. No suspicious abdominopelvic lymphadenopathy. Reproductive: Uterus is within normal limits. Bilateral ovaries are within normal limits. Other: No abdominopelvic ascites. Musculoskeletal: Right psoas lesion, as described above. Visualized osseous structures are within normal limits. IMPRESSION: Abnormal soft tissue/inflammation with heterogeneous enhancement of the right lower kidney, favoring pyelonephritis, with associated psoas abscess and gas-forming infection extending into the right retroperitoneum/posterior pararenal space. Suspected psoas abscess currently measures up to 6.6 cm. Ill-defined fluid/gas in the right posterior pararenal space measures  to 6.0 cm, but this does not reflect a well-defined fluid collection. Additional ancillary findings as above, including cirrhosis, cholelithiasis, and bilateral renal cysts. These results were called by telephone at the time of interpretation on 11/12/2017 at 11:39 am to [facility] , who verbally acknowledged these results. Electronically Signed   By: Charline Bills M.D.   On: 11/12/2017 11:41    EKG: Independently reviewed.   Assessment/Plan Active Problems:   Acute pyelonephritis   Psoas abscess (HCC)   Diabetes type 2, uncontrolled (HCC)   Autoimmune hepatitis (HCC)   Uncontrolled diabetes mellitus type 2 without complications (HCC)   Acute lower UTI   62 y.o. female with PMH of HTN, DM, Nephrolithiasis, Autoimmune hepatitis (on prednisone), CKD, E coli-UTI and Sepsis (06/2017) presented with progressively worsening right flank pain.    Psoas abscess. Acute right pyelonephritis. CT scan: Abnormal soft tissue/inflammation with heterogeneous enhancement of the right lower kidney, favoring pyelonephritis, with associated psoas abscess and gas-forming infection extending into the right retroperitoneum/posterior pararenal space. Suspected psoas abscess currently measures up to 6.6 cm. Ill-defined fluid/gas in the right posterior pararenal space measures to 6.0 cm, but this does not reflect a well-defined fluid collection. -we will admit to SDU. Iv antibiotics with vanc/cefepime. Monitor urine output, labs. ED d/w urology who recommended who recommended IR  drainage of the psoas abscess, but no surgical intervention at this time. Urology will consult. I have called and d/w Dr. Fredia Sorrow from IR. He plans for IR drainage in AM.   Sepsis due to pyelonephritis, psoas abscess. Lactate 2.9 on admission, trending down to 2.4. BP is stable, mild tachycardia on admission. We will cont iv fluids, iv antibiotic treatment, iv steroids (due to chronic steroid use possible adrenal suppression), f/u labs,  lactate, blood pressure.  Close monitor in SDU  DM. Uncontrolled. Hyperglycemia, bicarb at 29, no anion gap. Received sq insulin in ED. Will resume ISS,lantus. If remains elevated may need iv insulin drip. Home regimen: lantus 28U PM, 10U AM. +ISS  HTN. BP is soft. hold amlodipine, BB, losartan   Autoimmune hepatitis. Patient is on chronic intermittent prednisone, and mercaptopurine. Will use iv steroid due to sepsis. Will hold mercaptopurine   Urology, IR.  if consultant consulted, please document name and whether formally or informally consulted  Code Status: full (must indicate code status--if unknown or must be presumed, indicate so) Family Communication: d/w patient, her family. IR (indicate person spoken with, if applicable, with phone number if by telephone) Disposition Plan: home pend improvement  (indicate anticipated LOS)  Time spent: >45 minutes   Esperanza Sheets [facility] Pager [phone]  If 7PM-7AM, please contact night-coverage [email or web address] Password [facility] 11/12/2017, 4:17 PM

## 2017-11-12 NOTE — Progress Notes (Signed)
A consult was received from an ED physician for vancomycin and zosyn per pharmacy dosing.  The patient's profile has been reviewed for ht/wt/allergies/indication/available labs.   No current weight in Epic - I have ordered a weight. Used wt of 114.7 kg from 07/03/17 A one time order has been placed for vancomycin 2500 mg and zosyn 3.375 gm IV x 1   Further antibiotics/pharmacy consults should be ordered by admitting physician if indicated.                       Thank you,  Herby AbrahamMichelle T. Champ Keetch, Pharm.D. [phone] 11/12/2017 2:57 PM

## 2017-11-12 NOTE — Progress Notes (Signed)
Pharmacy Antibiotic Note  Dana Thornton is a 62 y.o. female admitted on 11/12/2017 with pyelonephritis with associated psoas abscess.  Pharmacy has been consulted for vancomycin and cefepime dosing. No current weight in Epic - I ordered one at 1441, using weight from 07/03/17 of 114.7 kg. WBC elevated, Lactate elevated, temp WNL.   3/5 CT ZOX:[id number]:pyelonephritis with associated psoas abscess and gas-forming infection extending into the retroperitoneum/posterior pararenal space.   Plan: Cefepime 2 gm IV q12 Vancomycin 2500 mg at 1541 loading dose then vancomycin 750 mg IV q24 for est AUC 470 using SCr 1.39 and wt 114.7 kg from 07/03/17 F/u renal fxn, wbc, temp, culture data Vancomycin levels as needed   Temp (24hrs), Avg:97.8 F (36.6 C), Min:97.7 F (36.5 C), Max:97.9 F (36.6 C)  Recent Labs  Lab 11/12/17 1252 11/12/17 1426 11/12/17 1616  WBC 34.1*  --   --   LATICACIDVEN  --  2.91* 2.44*    CrCl cannot be calculated (Patient's most recent lab result is older than the maximum 21 days allowed.).    Allergies  Allergen Reactions  . Codeine Nausea And Vomiting   Antimicrobials this admission:  3/5 Zosyn x1 3/5 Vanc>> 3/5 cefepime>> Dose adjustments this admission:   Microbiology results:  3/5 Ucx>> 3/5 BCx2>> 3/5 flu neg  Thank you for allowing pharmacy to be a part of this patient's care.  Herby AbrahamMichelle T. Jonas Goh, Pharm.D. [phone] 11/12/2017 4:46 PM

## 2017-11-12 NOTE — Consult Note (Signed)
Reason for Consult: Severe Right Kidney Infection / Psoas Abscess  Referring Physician: Drema PryPedro Cardama MD  Darene LamerGloria W Mcdill is an 62 y.o. female.   HPI:   1 - Severe Right Kidney Infection / Psoas Abscess - large renal / peri-renal / psoas abscess containing gas - phlegmon by CT 11/2017 on eval night sweats and flank pain. No hydro / stones / delayed nephrogram. She is diabetic. BCX 3/5 - pending UCX 3/5 - pending. Most recent prior UCX pan-sensitive e. Coli from 2018. Placed on empiric vanc+zosyn and IR perc drain pending. Initial leukocytosis to 34k, lactat 2.9, no recent A1c for review, but glucose today 553. 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   PMH sig for IDDM2, autoimmune hepatitis, benign breast surgery.   Today "Dana Thornton" is seen as urgent consult for above.   Past Medical History:  Diagnosis Date  . Hypercholesterolemia   . Hypertension   . Kidney stones   . Type II diabetes mellitus (HCC)     Past Surgical History:  Procedure Laterality Date  . BREAST EXCISIONAL BIOPSY    . CESAREAN SECTION  1999  . DILATION AND CURETTAGE OF UTERUS     "I lost a baby"  . TUBAL LIGATION  1999    Family History  Problem Relation Age of Onset  . Kidney failure Father   . Hypertension Father     Social History:  reports that  has never smoked. she has never used smokeless tobacco. She reports that she does not drink alcohol or use drugs.  Allergies:  Allergies  Allergen Reactions  . Codeine Nausea And Vomiting    Medications: I have reviewed the patient's current medications.  Results for orders placed or performed during the hospital encounter of 11/12/17 (from the past 48 hour(s))  Urinalysis, Routine w reflex microscopic- may I&O cath if menses     Status: Abnormal   Collection Time: 11/12/17 12:52 PM  Result Value Ref Range   Color, Urine YELLOW YELLOW   APPearance CLEAR CLEAR   Specific Gravity, Urine >1.046 (H)  1.005 - 1.030   pH 6.0 5.0 - 8.0   Glucose, UA >=500 (A) NEGATIVE mg/dL   Hgb urine dipstick SMALL (A) NEGATIVE   Bilirubin Urine NEGATIVE NEGATIVE   Ketones, ur NEGATIVE NEGATIVE mg/dL   Protein, ur NEGATIVE NEGATIVE mg/dL   Nitrite POSITIVE (A) NEGATIVE   Leukocytes, UA SMALL (A) NEGATIVE   RBC / HPF 0-5 0 - 5 RBC/hpf   WBC, UA TOO NUMEROUS TO COUNT 0 - 5 WBC/hpf   Bacteria, UA RARE (A) NONE SEEN   Squamous Epithelial / LPF 0-5 (A) NONE SEEN    Comment: Performed at [facility], [address] [place], [place] [address]  CBC     Status: Abnormal   Collection Time: 11/12/17 12:52 PM  Result Value Ref Range   WBC 34.1 (H) 4.0 - 10.5 K/uL   RBC 4.27 3.87 - 5.11 MIL/uL   Hemoglobin 12.7 12.0 - 15.0 g/dL   HCT 60.437.0 54.036.0 - 98.146.0 %   MCV 86.7 78.0 - 100.0 fL   MCH 29.7 26.0 - 34.0 pg   MCHC 34.3 30.0 - 36.0 g/dL   RDW 19.114.5 47.811.5 - 29.515.5 %   Platelets 297 150 - 400 K/uL    Comment: Performed at [facility], [address] [place], [place] [address]  Protime-INR     Status: Abnormal   Collection  Time: 11/12/17  1:47 PM  Result Value Ref Range   Prothrombin Time 15.8 (H) 11.4 - 15.2 seconds   INR 1.27     Comment: Performed at [facility], [address] [place], [place] [address]  I-Stat CG4 Lactic Acid, ED     Status: Abnormal   Collection Time: 11/12/17  2:26 PM  Result Value Ref Range   Lactic Acid, Venous 2.91 (HH) 0.5 - 1.9 mmol/L   Comment NOTIFIED PHYSICIAN   POC CBG, ED     Status: Abnormal   Collection Time: 11/12/17  4:05 PM  Result Value Ref Range   Glucose-Capillary 553 (HH) 65 - 99 mg/dL   Comment 1 Call MD NNP PA CNM   I-Stat CG4 Lactic Acid, ED     Status: Abnormal   Collection Time: 11/12/17  4:16 PM  Result Value Ref Range   Lactic Acid, Venous 2.44 (HH) 0.5 - 1.9 mmol/L   Comment NOTIFIED PHYSICIAN     Ct Abdomen Pelvis W Contrast  Result Date: 11/12/2017 CLINICAL DATA:  Right upper quadrant pain  x2 weeks, melena, history of renal stones. EXAM: CT ABDOMEN AND PELVIS WITH CONTRAST TECHNIQUE: Multidetector CT imaging of the abdomen and pelvis was performed using the standard protocol following bolus administration of intravenous contrast. CONTRAST:  ISOVUE-300 IOPAMIDOL (ISOVUE-300) INJECTION 61% COMPARISON:  06/27/2017 FINDINGS: Lower chest: Lung bases are clear. Hepatobiliary: Nodular hepatic contour, suggesting cirrhosis. No focal hepatic lesion is seen. Layering 6 mm gallstone (series 2/image 42), without associated inflammatory changes. No intrahepatic or extrahepatic ductal dilatation. Pancreas: Within normal limits. Spleen: Spleen is normal in size. Adrenals/Urinary Tract: 1.7 cm right adrenal nodule (series 2/image 26; sagittal image 82), indeterminate, although favored to be present in 2013, suggesting a benign adrenal adenoma. Left adrenal gland is within normal limits. Multiple bilateral renal cysts, measuring up to 5.2 cm in the anterior interpolar right kidney (series 2/image 36). No hydronephrosis. Abnormal soft tissue/inflammation with heterogeneous enhancement of the right lower kidney (series 2/image 44). Secondary involvement of the right psoas muscle with associated centrally necrotic 3.8 x 5.7 x 6.6 cm lesion in the right psoas (series 2/image 47). Associated fluid/gas in the right retroperitoneum/posterior pararenal space, measuring up to 5.9 x 4.9 x 6.0 cm (series 2/image 54), although this does not reflect a well-defined fluid collection. Overall, given this constellation of findings, this appearance favors pyelonephritis with associated psoas abscess and gas-forming infection extending into the retroperitoneum/posterior pararenal space. Technically speaking, some of this abnormal soft tissue and the right psoas abnormality could reflect tumor, but this would not account for the associated soft tissue inflammation/gas. Bladder is within normal limits. Stomach/Bowel: Stomach is  within normal limits. No evidence of bowel obstruction. Normal appendix (series 2/image 62). Notably, the ascending colon/cecum and appendix are remote from the fluid/gas in the posterior pararenal space. Vascular/Lymphatic: No evidence of abdominal aortic aneurysm. Atherosclerotic calcifications of the abdominal aorta and branch vessels. No suspicious abdominopelvic lymphadenopathy. Reproductive: Uterus is within normal limits. Bilateral ovaries are within normal limits. Other: No abdominopelvic ascites. Musculoskeletal: Right psoas lesion, as described above. Visualized osseous structures are within normal limits. IMPRESSION: Abnormal soft tissue/inflammation with heterogeneous enhancement of the right lower kidney, favoring pyelonephritis, with associated psoas abscess and gas-forming infection extending into the right retroperitoneum/posterior pararenal space. Suspected psoas abscess currently measures up to 6.6 cm. Ill-defined fluid/gas in the right posterior pararenal space measures to 6.0 cm, but this does not reflect a well-defined fluid collection. Additional ancillary findings  as above, including cirrhosis, cholelithiasis, and bilateral renal cysts. These results were called by telephone at the time of interpretation on 11/12/2017 at 11:39 am to [facility] , who verbally acknowledged these results. Electronically Signed   By: Charline Bills M.D.   On: 11/12/2017 11:41    Review of Systems  Constitutional: Positive for diaphoresis and malaise/fatigue. Negative for chills and fever.  HENT: Negative.   Eyes: Negative.   Respiratory: Negative.   Cardiovascular: Negative.   Gastrointestinal: Positive for nausea.  Genitourinary: Positive for flank pain.  Musculoskeletal: Positive for myalgias.  Skin: Negative.   Neurological: Negative.   Endo/Heme/Allergies: Negative.   Psychiatric/Behavioral: Negative.    Blood pressure 120/73, pulse (!) 104, temperature 97.9 F (36.6 C), temperature  source Oral, resp. rate 17, SpO2 94 %. Physical Exam  Constitutional: She appears well-developed.  Family at bedside in ER.   HENT:  Head: Normocephalic.  Eyes: Pupils are equal, round, and reactive to light.  Neck: Normal range of motion.  Cardiovascular:  Mild tachycardia 100-105 by bedside monitor that is regular.   Respiratory: Effort normal.  GI:  Large truncal obesity.   Genitourinary:  Genitourinary Comments: Mild Rt CVAT at presnt.   Musculoskeletal: Normal range of motion.  Neurological: She is alert.  Skin: Skin is warm.  Psychiatric: She has a normal mood and affect.    Assessment/Plan:  1 - Severe Right Kidney Infection / Psoas Abscess - amazingly this does not appear to be obstrutive urinary process as no stones, hydro, delayed nephrogram. This is more likely severe infectious GU manifestation of severe hyperglycemia exacerbated by recent steroid use.   Rec IR Perc drain into psoas abscess, IV ABX, Aggressive glycemic control in house. No role for additional GU drainage at this point.   Would also rec repeat axial imaging prior to discharge after clear systemic infectious parmaters to ensure no addiitonal drainable collections persist.   Will follow periodically at this time as there are no surgical indications, please call me directly with questions anytime.   Victoria Euceda 11/12/2017, 4:41 PM

## 2017-11-12 NOTE — ED Notes (Signed)
NT collected I stat Lactic acid plasma, results to follow.

## 2017-11-12 NOTE — ED Triage Notes (Signed)
Pt sent by physician related to confirm pyelonephritis and psoas abscess. Pt complaint of right flank pain.

## 2017-11-12 NOTE — Progress Notes (Signed)
Pt gave permission for her husband, daughter, and son to remain in the room while questions on the nursing admission history were asked. Dana Thornton. [name] BSN, RN-BC Admissions RN 11/12/2017 6:47 PM

## 2017-11-12 NOTE — ED Provider Notes (Addendum)
[facility]ENC[facility]PT Provider Note  CSN: [id number] Arrival date & time: 11/12/17 1238  Chief Complaint(s) Flank Pain  HPI Dana Thornton is a 62 y.o. female   The history is provided by the patient.  Flank Pain  This is a recurrent problem. Episode onset: 2 weeks. Episode frequency: intermittent. The problem has been gradually worsening. Associated symptoms include abdominal pain. The symptoms are aggravated by walking and bending. Relieved by: intially relieved by Aleve, no improvement now.   Complaints of night sweats.  Seen at PCP's office who obtained CT abd/pelvis which noted right pyelonephritis with likely psoas abscess.  Past Medical History Past Medical History:  Diagnosis Date  . Hypercholesterolemia   . Hypertension   . Kidney stones   . Type II diabetes mellitus [facility])    Patient Active Problem List   Diagnosis Date Noted  . Hyperkalemia   . AKI (acute kidney injury) (HCC)   . Septic shock (HCC)   . Pyelonephritis, acute   . Sepsis (HCC) 06/27/2017  . Hyperglycemia 04/09/2015  . DKA (diabetic ketoacidoses) (HCC) 04/09/2015  . Hypotension 04/09/2015  . ARF (acute renal failure) (HCC) 04/09/2015  . Transaminitis 04/09/2015  . Shock liver 04/09/2015  . Lactic acidosis 04/09/2015  . Protein-calorie malnutrition, severe (HCC) 03/30/2015  . Oral thrush 03/29/2015  . Poor appetite 03/29/2015  . Uncontrolled diabetes mellitus type 2 without complications (HCC) 03/29/2015  . Vol depletion 03/28/2015  . Aspiration pneumonia (HCC) 12/07/2011  . Hypernatremia 12/07/2011  . HTN (hypertension) 12/07/2011  . Diabetes type 2, uncontrolled (HCC) 12/03/2011  . Autoimmune hepatitis (HCC) 12/03/2011   Home Medication(s) Prior to Admission medications   Medication Sig Start Date End Date Taking? Authorizing Provider  albuterol (PROVENTIL HFA;VENTOLIN HFA) 108 (90 Base) MCG/ACT inhaler Inhale 2 puffs into the lungs every 6 (six) hours as needed  for wheezing or shortness of breath. 07/03/17  Yes Rhetta Mura, MD  amLODipine (NORVASC) 10 MG tablet Take 10 mg by mouth daily.   Yes [provider]  carvedilol (COREG) 6.25 MG tablet Take 6.25 mg by mouth 2 (two) times daily with a meal.   Yes [provider]  ezetimibe (ZETIA) 10 MG tablet Take 10 mg by mouth daily.   Yes [provider]  insulin aspart (NOVOLOG) 100 UNIT/ML injection Before each meal 3 times a day, 120-150 - 2 units, 151-200 - 4 units, 201-250 - 8 units, 251-300 - 10 units,  301 or above 10 units and call your MD Patient taking differently: Inject 10 Units into the skin 2 (two) times daily.  12/10/11  Yes Leroy Sea, MD  Insulin Detemir (LEVEMIR FLEXPEN [place]) Inject 28 Units into the skin at bedtime.   Yes [provider]  insulin glargine (LANTUS) 100 UNIT/ML injection Take 30 Units in the morning and 10 units at Night. Patient taking differently: Inject 10 Units into the skin 2 (two) times daily.  04/13/15  Yes Osvaldo Shipper, MD  levothyroxine (SYNTHROID, LEVOTHROID) 150 MCG tablet Take 1 tablet (150 mcg total) by mouth daily before breakfast. Patient taking differently: Take 112 mcg by mouth daily before breakfast.  04/13/15  Yes Osvaldo Shipper, MD  losartan (COZAAR) 100 MG tablet Take 100 mg by mouth daily.   Yes [provider]  mercaptopurine (PURINETHOL) 50 MG tablet Take 150 mg by mouth daily. Give on an empty stomach 1 hour before or 2 hours after meals. Caution: Chemotherapy.   Yes [provider]  naproxen sodium (ALEVE)  220 MG tablet Take 220 mg by mouth daily as needed (PAIN).   Yes [provider]  predniSONE (DELTASONE) 20 MG tablet Take 2 tablets (40 mg total) by mouth daily with breakfast. Patient taking differently: Take 10 mg by mouth daily with breakfast.  04/13/15  Yes Osvaldo Shipper, MD  terconazole (TERAZOL 7) 0.4 % vaginal cream Place 1 applicator vaginally daily. 10/21/17  Yes  [provider]  Vitamin D, Ergocalciferol, (DRISDOL) 50000 units CAPS capsule Take 50,000 Units by mouth every 7 (seven) days.   Yes [provider]  cefUROXime (CEFTIN) 500 MG tablet Take 1 tablet (500 mg total) by mouth daily with breakfast. Patient not taking: Reported on 11/12/2017 07/03/17   Rhetta Mura, MD  Spacer/Aero-Holding Chambers (BREATHERITE COLL SPACER ADULT) MISC 1 Device by Does not apply route every 6 (six) hours as needed. 07/03/17   Rhetta Mura, MD                                                                                                                                    Past Surgical History Past Surgical History:  Procedure Laterality Date  . BREAST EXCISIONAL BIOPSY    . CESAREAN SECTION  1999  . DILATION AND CURETTAGE OF UTERUS     "I lost a baby"  . TUBAL LIGATION  1999   Family History Family History  Problem Relation Age of Onset  . Kidney failure Father   . Hypertension Father     Social History Social History   Tobacco Use  . Smoking status: Never Smoker  . Smokeless tobacco: Never Used  Substance Use Topics  . Alcohol use: No  . Drug use: No   Allergies Codeine  Review of Systems Review of Systems  Gastrointestinal: Positive for abdominal pain and nausea. Negative for diarrhea and vomiting.  Genitourinary: Positive for flank pain.   All other systems are reviewed and are negative for acute change except as noted in the HPI  Physical Exam Vital Signs  I have reviewed the triage vital signs BP 122/81   Pulse (!) 105   Temp 97.9 F (36.6 C) (Oral)   Resp (!) 29   SpO2 95%   Physical Exam  Constitutional: She is oriented to person, place, and time. She appears well-developed and well-nourished. No distress.  HENT:  Head: Normocephalic and atraumatic.  Nose: Nose normal.  Eyes: Conjunctivae and EOM are normal. Pupils are equal, round, and reactive to light. Right eye exhibits no discharge. Left  eye exhibits no discharge. No scleral icterus.  Neck: Normal range of motion. Neck supple.  Cardiovascular: Normal rate and regular rhythm. Exam reveals no gallop and no friction rub.  No murmur heard. Pulmonary/Chest: Effort normal and breath sounds normal. No stridor. No respiratory distress. She has no rales.  Abdominal: Soft. She exhibits no distension. There is tenderness (right flank and CVA tenderness). There is no rigidity, no rebound and  no guarding.  Musculoskeletal: She exhibits no edema or tenderness.  Neurological: She is alert and oriented to person, place, and time.  Skin: Skin is warm and dry. No rash noted. She is not diaphoretic. No erythema.  Psychiatric: She has a normal mood and affect.  Vitals reviewed.   ED Results and Treatments Labs (all labs ordered are listed, but only abnormal results are displayed) Labs Reviewed  URINALYSIS, ROUTINE W REFLEX MICROSCOPIC  BASIC METABOLIC PANEL  CBC                                                                                                                         EKG  EKG Interpretation  Date/Time:  Tuesday November 12 2017 14:01:52 EST Ventricular Rate:  102 PR Interval:    QRS Duration: 130 QT Interval:  330 QTC Calculation: 430 R Axis:   11 Text Interpretation:  Sinus tachycardia Nonspecific intraventricular conduction delay Borderline T abnormalities, diffuse leads No significant change since last tracing Confirmed by Drema Pryardama, Dain Laseter [phone]([id number]) on 11/12/2017 4:21:53 PM      Radiology Ct Abdomen Pelvis W Contrast  Result Date: 11/12/2017 CLINICAL DATA:  Right upper quadrant pain x2 weeks, melena, history of renal stones. EXAM: CT ABDOMEN AND PELVIS WITH CONTRAST TECHNIQUE: Multidetector CT imaging of the abdomen and pelvis was performed using the standard protocol following bolus administration of intravenous contrast. CONTRAST:  125mL ISOVUE-300 IOPAMIDOL (ISOVUE-300) INJECTION 61% COMPARISON:  06/27/2017 FINDINGS: Lower  chest: Lung bases are clear. Hepatobiliary: Nodular hepatic contour, suggesting cirrhosis. No focal hepatic lesion is seen. Layering 6 mm gallstone (series 2/image 42), without associated inflammatory changes. No intrahepatic or extrahepatic ductal dilatation. Pancreas: Within normal limits. Spleen: Spleen is normal in size. Adrenals/Urinary Tract: 1.7 cm right adrenal nodule (series 2/image 26; sagittal image 82), indeterminate, although favored to be present in 2013, suggesting a benign adrenal adenoma. Left adrenal gland is within normal limits. Multiple bilateral renal cysts, measuring up to 5.2 cm in the anterior interpolar right kidney (series 2/image 36). No hydronephrosis. Abnormal soft tissue/inflammation with heterogeneous enhancement of the right lower kidney (series 2/image 44). Secondary involvement of the right psoas muscle with associated centrally necrotic 3.8 x 5.7 x 6.6 cm lesion in the right psoas (series 2/image 47). Associated fluid/gas in the right retroperitoneum/posterior pararenal space, measuring up to 5.9 x 4.9 x 6.0 cm (series 2/image 54), although this does not reflect a well-defined fluid collection. Overall, given this constellation of findings, this appearance favors pyelonephritis with associated psoas abscess and gas-forming infection extending into the retroperitoneum/posterior pararenal space. Technically speaking, some of this abnormal soft tissue and the right psoas abnormality could reflect tumor, but this would not account for the associated soft tissue inflammation/gas. Bladder is within normal limits. Stomach/Bowel: Stomach is within normal limits. No evidence of bowel obstruction. Normal appendix (series 2/image 62). Notably, the ascending colon/cecum and appendix are remote from the fluid/gas in the posterior pararenal space. Vascular/Lymphatic: No evidence of abdominal aortic aneurysm. Atherosclerotic calcifications  of the abdominal aorta and branch vessels. No suspicious  abdominopelvic lymphadenopathy. Reproductive: Uterus is within normal limits. Bilateral ovaries are within normal limits. Other: No abdominopelvic ascites. Musculoskeletal: Right psoas lesion, as described above. Visualized osseous structures are within normal limits. IMPRESSION: Abnormal soft tissue/inflammation with heterogeneous enhancement of the right lower kidney, favoring pyelonephritis, with associated psoas abscess and gas-forming infection extending into the right retroperitoneum/posterior pararenal space. Suspected psoas abscess currently measures up to 6.6 cm. Ill-defined fluid/gas in the right posterior pararenal space measures to 6.0 cm, but this does not reflect a well-defined fluid collection. Additional ancillary findings as above, including cirrhosis, cholelithiasis, and bilateral renal cysts. These results were called by telephone at the time of interpretation on 11/12/2017 at 11:39 am to [facility] , who verbally acknowledged these results. Electronically Signed   By: Charline Bills M.D.   On: 11/12/2017 11:41   Pertinent labs & imaging results that were available during my care of the patient were reviewed by me and considered in my medical decision making (see chart for details).  Medications Ordered in ED Medications  vancomycin (VANCOCIN) 2,500 mg in sodium chloride 0.9 % 500 mL IVPB (2,500 mg Intravenous New Bag/Given 11/12/17 1541)  insulin aspart (novoLOG) injection 11 Units (not administered)  sodium chloride 0.9 % bolus 1,000 mL (1,000 mLs Intravenous New Bag/Given 11/12/17 1425)  fentaNYL (SUBLIMAZE) injection 50 mcg (50 mcg Intravenous Given 11/12/17 1425)  ondansetron (ZOFRAN) injection 4 mg (4 mg Intravenous Given 11/12/17 1426)  piperacillin-tazobactam (ZOSYN) IVPB 3.375 g (0 g Intravenous Stopped 11/12/17 1554)  fentaNYL (SUBLIMAZE) injection 25 mcg (25 mcg Intravenous Given 11/12/17 1526)                                                                                                                                     Procedures Procedures CRITICAL CARE Performed by: Amadeo Garnet Shermon Bozzi Total critical care time: 40 minutes Critical care time was exclusive of separately billable procedures and treating other patients. Critical care was necessary to treat or prevent imminent or life-threatening deterioration. Critical care was time spent personally by me on the following activities: development of treatment plan with patient and/or surrogate as well as nursing, discussions with consultants, evaluation of patient's response to treatment, examination of patient, obtaining history from patient or surrogate, ordering and performing treatments and interventions, ordering and review of laboratory studies, ordering and review of radiographic studies, pulse oximetry and re-evaluation of patient's condition.   (including critical care time)  Medical Decision Making / ED Course I have reviewed the nursing notes for this encounter and the patient's prior records (if available in EHR or on provided paperwork).  Clinical Course as of Nov 12 1604  Tue Nov 12, 2017  1434 Patient with known renal infection read as pyelonephritis with likely psoas abscess.  Also considering infected renal cyst given her lack of urinary symptoms and notable renal cyst on the contralateral  side.  Labs from PCP were notable for hyperglycemia in 400s and leukocytosis of 31.7.  Code sepsis initiated and patient started on empiric antibiotics.  Will obtain labs here and discuss case with urology and medicine for admission.  [PC]  1440 Labs with hyperglycemia without evidence of DKA.  Patient will be provided with IV fluids and a dose of subcu insulin.  Q. 3-hour CBGs  [PC]  1515 Discussed case with Dr. Berneice Heinrich from urology who recommended medicine admission with IR drain.  He will evaluate the patient tomorrow but does not anticipate surgery at this time.  [PC]  1606 Discussed the case with medicine who will  admit the patient for further workup and management.  [PC]    Clinical Course User Index [PC] Earmon Sherrow, Amadeo Garnet, MD     Final Clinical Impression(s) / ED Diagnoses Final diagnoses:  Emphysematous pyelonephritis of right kidney  Renal abscess  Psoas abscess, right (HCC)  Sepsis, due to unspecified organism [facility])      This chart was dictated using voice recognition software.  Despite best efforts to proofread,  errors can occur which can change the documentation meaning.     Nira Conn, MD 11/12/17 1622

## 2017-11-12 NOTE — ED Notes (Signed)
Pt given a Carb modified meal now, waiting for Lantus from pharmacy.

## 2017-11-12 NOTE — ED Notes (Signed)
ED Provider at bedside. 

## 2017-11-13 ENCOUNTER — Inpatient Hospital Stay (HOSPITAL_COMMUNITY): Payer: 59

## 2017-11-13 ENCOUNTER — Other Ambulatory Visit: Payer: Self-pay

## 2017-11-13 DIAGNOSIS — E1165 Type 2 diabetes mellitus with hyperglycemia: Secondary | ICD-10-CM

## 2017-11-13 LAB — GLUCOSE, CAPILLARY
GLUCOSE-CAPILLARY: 296 mg/dL — AB (ref 65–99)
Glucose-Capillary: 431 mg/dL — ABNORMAL HIGH (ref 65–99)
Glucose-Capillary: 410 mg/dL — ABNORMAL HIGH (ref 65–99)
Glucose-Capillary: 420 mg/dL — ABNORMAL HIGH (ref 65–99)
Glucose-Capillary: 278 mg/dL — ABNORMAL HIGH (ref 65–99)
Glucose-Capillary: 253 mg/dL — ABNORMAL HIGH (ref 65–99)
Glucose-Capillary: 485 mg/dL — ABNORMAL HIGH (ref 65–99)

## 2017-11-13 LAB — COMPREHENSIVE METABOLIC PANEL
ALK PHOS: 159 U/L — AB (ref 38–126)
ALT: 27 U/L (ref 14–54)
AST: 25 U/L (ref 15–41)
Albumin: 2 g/dL — ABNORMAL LOW (ref 3.5–5.0)
Anion gap: 7 (ref 5–15)
BILIRUBIN TOTAL: 0.9 mg/dL (ref 0.3–1.2)
BUN: 25 mg/dL — ABNORMAL HIGH (ref 6–20)
CALCIUM: 8.3 mg/dL — AB (ref 8.9–10.3)
CO2: 22 mmol/L (ref 22–32)
CREATININE: 1.31 mg/dL — AB (ref 0.44–1.00)
Chloride: 104 mmol/L (ref 101–111)
GFR calc Af Amer: 50 mL/min — ABNORMAL LOW (ref >60)
GFR, EST NON AFRICAN AMERICAN: 43 mL/min — AB (ref >60)
GLUCOSE: 428 mg/dL — AB (ref 65–99)
Potassium: 4.6 mmol/L (ref 3.5–5.1)
Sodium: 133 mmol/L — ABNORMAL LOW (ref 135–145)
TOTAL PROTEIN: 7.8 g/dL (ref 6.5–8.1)

## 2017-11-13 LAB — CBC
HCT: 34 % — ABNORMAL LOW (ref 36.0–46.0)
HCT: 31.7 % — ABNORMAL LOW (ref 36.0–46.0)
Hemoglobin: 11.6 g/dL — ABNORMAL LOW (ref 12.0–15.0)
Hemoglobin: 10.7 g/dL — ABNORMAL LOW (ref 12.0–15.0)
MCH: 30 pg (ref 26.0–34.0)
MCH: 29.5 pg (ref 26.0–34.0)
MCHC: 34.1 g/dL (ref 30.0–36.0)
MCHC: 33.8 g/dL (ref 30.0–36.0)
MCV: 87.9 fL (ref 78.0–100.0)
MCV: 87.3 fL (ref 78.0–100.0)
PLATELETS: 251 10*3/uL (ref 150–400)
Platelets: 253 10*3/uL (ref 150–400)
RBC: 3.87 MIL/uL (ref 3.87–5.11)
RBC: 3.63 MIL/uL — ABNORMAL LOW (ref 3.87–5.11)
RDW: 14.8 % (ref 11.5–15.5)
RDW: 14.7 % (ref 11.5–15.5)
WBC: 35.3 10*3/uL — AB (ref 4.0–10.5)
WBC: 32.3 10*3/uL — ABNORMAL HIGH (ref 4.0–10.5)

## 2017-11-13 LAB — BASIC METABOLIC PANEL
Anion gap: 6 (ref 5–15)
BUN: 24 mg/dL — ABNORMAL HIGH (ref 6–20)
CO2: 24 mmol/L (ref 22–32)
Calcium: 8.6 mg/dL — ABNORMAL LOW (ref 8.9–10.3)
Chloride: 107 mmol/L (ref 101–111)
Creatinine, Ser: 1.35 mg/dL — ABNORMAL HIGH (ref 0.44–1.00)
GFR calc Af Amer: 48 mL/min — ABNORMAL LOW (ref >60)
GFR calc non Af Amer: 41 mL/min — ABNORMAL LOW (ref >60)
Glucose, Bld: 424 mg/dL — ABNORMAL HIGH (ref 65–99)
Potassium: 4.6 mmol/L (ref 3.5–5.1)
Sodium: 137 mmol/L (ref 135–145)

## 2017-11-13 LAB — BLOOD CULTURE ID PANEL (REFLEXED)
ACINETOBACTER BAUMANNII: NOT DETECTED
CANDIDA ALBICANS: NOT DETECTED
CANDIDA PARAPSILOSIS: NOT DETECTED
Candida glabrata: NOT DETECTED
Candida krusei: NOT DETECTED
Candida tropicalis: NOT DETECTED
ENTEROBACTERIACEAE SPECIES: NOT DETECTED
ESCHERICHIA COLI: NOT DETECTED
Enterobacter cloacae complex: NOT DETECTED
Enterococcus species: NOT DETECTED
HAEMOPHILUS INFLUENZAE: NOT DETECTED
KLEBSIELLA OXYTOCA: NOT DETECTED
Klebsiella pneumoniae: NOT DETECTED
Listeria monocytogenes: NOT DETECTED
METHICILLIN RESISTANCE: NOT DETECTED
Neisseria meningitidis: NOT DETECTED
PSEUDOMONAS AERUGINOSA: NOT DETECTED
Proteus species: NOT DETECTED
STREPTOCOCCUS AGALACTIAE: NOT DETECTED
STREPTOCOCCUS PNEUMONIAE: NOT DETECTED
STREPTOCOCCUS PYOGENES: NOT DETECTED
Serratia marcescens: NOT DETECTED
Staphylococcus aureus (BCID): NOT DETECTED
Staphylococcus species: DETECTED — AB
Streptococcus species: NOT DETECTED

## 2017-11-13 LAB — LACTIC ACID, PLASMA: LACTIC ACID, VENOUS: 1.6 mmol/L (ref 0.5–1.9)

## 2017-11-13 LAB — HEMOGLOBIN A1C
HEMOGLOBIN A1C: 12 % — AB (ref 4.8–5.6)
MEAN PLASMA GLUCOSE: 297.7 mg/dL

## 2017-11-13 LAB — MRSA PCR SCREENING: MRSA by PCR: NEGATIVE

## 2017-11-13 MED ORDER — OXYCODONE HCL 5 MG PO TABS
5.0000 mg | ORAL_TABLET | Freq: Four times a day (QID) | ORAL | Status: DC | PRN
  Administered 2017-11-13 – 2017-11-14 (×2): 10 mg via ORAL
  Filled 2017-11-13 (×2): qty 2

## 2017-11-13 MED ORDER — MIDAZOLAM HCL 2 MG/2ML IJ SOLN
INTRAMUSCULAR | Status: AC | PRN
  Administered 2017-11-13 (×2): 1 mg via INTRAVENOUS

## 2017-11-13 MED ORDER — INSULIN ASPART 100 UNIT/ML ~~LOC~~ SOLN
20.0000 [IU] | Freq: Once | SUBCUTANEOUS | Status: AC
  Administered 2017-11-13: 20 [IU] via SUBCUTANEOUS

## 2017-11-13 MED ORDER — FENTANYL CITRATE (PF) 100 MCG/2ML IJ SOLN
INTRAMUSCULAR | Status: AC
  Filled 2017-11-13: qty 2

## 2017-11-13 MED ORDER — INSULIN GLARGINE 100 UNIT/ML ~~LOC~~ SOLN
28.0000 [IU] | Freq: Every day | SUBCUTANEOUS | Status: DC
  Administered 2017-11-13 – 2017-11-15 (×3): 28 [IU] via SUBCUTANEOUS
  Filled 2017-11-13 (×4): qty 0.28

## 2017-11-13 MED ORDER — INSULIN GLARGINE 100 UNIT/ML ~~LOC~~ SOLN: 10.0000 [IU] | Freq: Every day | SUBCUTANEOUS | Status: DC

## 2017-11-13 MED ORDER — FENTANYL CITRATE (PF) 100 MCG/2ML IJ SOLN
50.0000 ug | INTRAMUSCULAR | Status: DC | PRN
  Administered 2017-11-13: 50 ug via INTRAVENOUS
  Filled 2017-11-13: qty 2

## 2017-11-13 MED ORDER — HYDRALAZINE HCL 20 MG/ML IJ SOLN: 10.0000 mg | Freq: Three times a day (TID) | INTRAMUSCULAR | Status: DC | PRN

## 2017-11-13 MED ORDER — LIP MEDEX EX OINT
TOPICAL_OINTMENT | CUTANEOUS | Status: DC | PRN
  Filled 2017-11-13: qty 7

## 2017-11-13 MED ORDER — INSULIN GLARGINE 100 UNIT/ML ~~LOC~~ SOLN
10.0000 [IU] | Freq: Every day | SUBCUTANEOUS | Status: DC
  Filled 2017-11-13: qty 0.1

## 2017-11-13 MED ORDER — MIDAZOLAM HCL 2 MG/2ML IJ SOLN
INTRAMUSCULAR | Status: AC
  Filled 2017-11-13: qty 6

## 2017-11-13 MED ORDER — ENOXAPARIN SODIUM 40 MG/0.4ML ~~LOC~~ SOLN
40.0000 mg | SUBCUTANEOUS | Status: DC
  Administered 2017-11-13 – 2017-11-16 (×4): 40 mg via SUBCUTANEOUS
  Filled 2017-11-13 (×4): qty 0.4

## 2017-11-13 MED ORDER — FENTANYL CITRATE (PF) 100 MCG/2ML IJ SOLN
INTRAMUSCULAR | Status: AC | PRN
  Administered 2017-11-13 (×2): 50 ug via INTRAVENOUS

## 2017-11-13 MED ORDER — INSULIN ASPART 100 UNIT/ML ~~LOC~~ SOLN
10.0000 [IU] | Freq: Once | SUBCUTANEOUS | Status: AC
  Administered 2017-11-13: 10 [IU] via SUBCUTANEOUS

## 2017-11-13 MED ORDER — FENTANYL CITRATE (PF) 100 MCG/2ML IJ SOLN
50.0000 ug | INTRAMUSCULAR | Status: DC | PRN
  Administered 2017-11-13 – 2017-11-17 (×9): 50 ug via INTRAVENOUS
  Filled 2017-11-13 (×9): qty 2

## 2017-11-13 MED ORDER — INSULIN ASPART 100 UNIT/ML ~~LOC~~ SOLN
15.0000 [IU] | Freq: Once | SUBCUTANEOUS | Status: AC
  Administered 2017-11-13: 15 [IU] via SUBCUTANEOUS

## 2017-11-13 MED ORDER — FENTANYL CITRATE (PF) 100 MCG/2ML IJ SOLN
INTRAMUSCULAR | Status: AC
  Filled 2017-11-13: qty 4

## 2017-11-13 MED ORDER — INSULIN GLARGINE 100 UNIT/ML ~~LOC~~ SOLN
10.0000 [IU] | Freq: Every day | SUBCUTANEOUS | Status: DC
  Administered 2017-11-13 – 2017-11-16 (×4): 10 [IU] via SUBCUTANEOUS
  Filled 2017-11-13 (×4): qty 0.1

## 2017-11-13 MED ORDER — HYDROCORTISONE NA SUCCINATE PF 100 MG IJ SOLR
25.0000 mg | Freq: Two times a day (BID) | INTRAMUSCULAR | Status: DC
  Administered 2017-11-13 – 2017-11-16 (×6): 25 mg via INTRAVENOUS
  Filled 2017-11-13 (×6): qty 2

## 2017-11-13 MED ORDER — SODIUM CHLORIDE 0.9 % IV SOLN
INTRAVENOUS | Status: AC
  Administered 2017-11-13 – 2017-11-14 (×5): via INTRAVENOUS

## 2017-11-13 MED ORDER — ONDANSETRON HCL 4 MG/2ML IJ SOLN
4.0000 mg | Freq: Three times a day (TID) | INTRAMUSCULAR | Status: DC | PRN
  Administered 2017-11-13 – 2017-11-16 (×6): 4 mg via INTRAVENOUS
  Filled 2017-11-13 (×6): qty 2

## 2017-11-13 MED ORDER — INSULIN ASPART 100 UNIT/ML ~~LOC~~ SOLN
10.0000 [IU] | Freq: Once | SUBCUTANEOUS | Status: AC
  Administered 2017-11-13: 10 [IU] via SUBCUTANEOUS
  Filled 2017-11-13: qty 1

## 2017-11-13 NOTE — Progress Notes (Signed)
[facility] PROGRESS NOTE  Dana Thornton UJW:[id number] DOB: 12-22-1955 DOA: 11/12/2017 PCP: Maurice Small, MD  Brief summary   62 y.o. female with PMH of HTN, DM, Nephrolithiasis, Autoimmune hepatitis (on prednisone), CKD, E coli-UTI and Sepsis (06/2017) presented with progressively worsening right flank pain.   -ED: leukocytosis. CT showed right pyelo with psoas abscess. ED d/w urology who recommended IR drainage. hospitalist is called for admission      Assessment/Plan:  Psoas abscess. Acute right pyelonephritis. CT scan: Abnormal soft tissue/inflammation with heterogeneous enhancement of the right lower kidney, favoring pyelonephritis, with associated psoas abscess and gas-forming infection extending into the right retroperitoneum/posterior pararenal space. Suspected psoas abscess currently measures up to 6.6 cm. Ill-defined fluid/gas in the right posterior pararenal space measures to 6.0 cm, but this does not reflect a well-defined fluid collection. -cont iv antibiotics with vanc/cefepime, pend cultures, needs abscess cultures. Monitor urine output, labs. Urology recommended IR drainage of the psoas abscess, but no surgical intervention at this time. I have d/w Dr. Fredia Sorrow from IR. He plans for IR drainage in today   Sepsis due to pyelonephritis, psoas abscess. Lactate 2.9 on admission, trending down. BP is stable, mild tachycardia on admission.  -cont iv fluids, iv antibiotic treatment, iv steroids (due to chronic steroid use possible adrenal suppression), f/u labs, lactate, blood pressure.  Close monitor in SDU  DM. Uncontrolled. Hyperglycemia, bicarb at 29, no anion gap on admission. ha1c-12.2. Cont ISS, lantus. May need iv insulin drip if remains hyperglycemic, NPO. Home regimen: lantus 28U PM, 10U AM. +ISS. Will adjust as needed  HTN. BP is soft. hold amlodipine, BB, losartan   Autoimmune hepatitis. Patient is on chronic intermittent prednisone, and mercaptopurine.  Will use iv steroid due to sepsis. Will hold mercaptopurine   Acute pain in the setting of pyelonephritis, psoas abscess. Not well controled with fentanyl only. She denies significant all;etrgicv reaction but just vomiting to opioids. Agreed to l try oxycodone.   Code Status: full Family Communication: d/w patient, her family. rn (indicate person spoken with, relationship, and if by phone, the number) Disposition Plan: pend clinical improvement    Consultants:  Urology   IR  Procedures:  Pend IR procedure   Antibiotics:  Cefepime 3/5>>>  vanc 3/5>>>>   (indicate start date, and stop date if known)  HPI/Subjective: Alert. Reports pain, not well controlled with fentanyl. No vomiting. No acute chest pains   Objective: Vitals:   11/13/17 0500 11/13/17 0600  BP: 123/70 139/79  Pulse: 92 91  Resp: 17 (!) 21  Temp:    SpO2: 92% 94%    Intake/Output Summary (Last 24 hours) at 11/13/2017 0821 Last data filed at 11/13/2017 5621 Gross per 24 hour  Intake 3289.59 ml  Output -  Net 3289.59 ml   There were no vitals filed for this visit.  Exam:   General:  mild distress due to psoas pain   Cardiovascular: s1,s2 rrr  Respiratory: cta bl  Abdomen: soft, nt nd   Musculoskeletal: no leg edema    Data Reviewed: Basic Metabolic Panel: Recent Labs  Lab 11/12/17 1347 11/13/17 0336 11/13/17 0451  NA 132* 133* 137  K 5.7* 4.6 4.6  CL 98* 104 107  CO2 25 22 24   GLUCOSE 520* 428* 424*  BUN 23* 25* 24*  CREATININE 1.39* 1.31* 1.35*  CALCIUM 9.1 8.3* 8.6*   Liver Function Tests: Recent Labs  Lab 11/12/17 1347 11/13/17 0336  AST 41 25  ALT 33 27  ALKPHOS 191* 159*  BILITOT 1.5* 0.9  PROT 8.8* 7.8  ALBUMIN 2.3* 2.0*   No results for input(s): LIPASE, AMYLASE in the last 168 hours. No results for input(s): AMMONIA in the last 168 hours. CBC: Recent Labs  Lab 11/12/17 1252 11/13/17 0336 11/13/17 0451  WBC 34.1* 35.3* 32.3*  HGB 12.7 11.6* 10.7*  HCT  37.0 34.0* 31.7*  MCV 86.7 87.9 87.3  PLT 297 251 253   Cardiac Enzymes: No results for input(s): CKTOTAL, CKMB, CKMBINDEX, TROPONINI in the last 168 hours. BNP (last 3 results) Recent Labs    06/27/17 1003  BNP 82.4    ProBNP (last 3 results) No results for input(s): PROBNP in the last 8760 hours.  CBG: Recent Labs  Lab 11/12/17 2337 11/13/17 0337 11/13/17 0458 11/13/17 0625 11/13/17 0806  GLUCAP 429* 431* 410* 420* 296*    Recent Results (from the past 240 hour(s))  MRSA PCR Screening     Status: None   Collection Time: 11/13/17  3:50 AM  Result Value Ref Range Status   MRSA by PCR NEGATIVE NEGATIVE Final    Comment:        The GeneXpert MRSA Assay (FDA approved for NASAL specimens only), is one component of a comprehensive MRSA colonization surveillance program. It is not intended to diagnose MRSA infection nor to guide or monitor treatment for MRSA infections. Performed at [facility], [address], [place], [place] [address]      Studies: Dg Chest 2 View  Result Date: 11/12/2017 CLINICAL DATA:  Night sweats for 2 weeks. History of diabetes and hypertension. EXAM: CHEST - 2 VIEW COMPARISON:  Chest radiograph July 07, 2017 FINDINGS: Cardiac silhouette is mildly enlarged. Improved aeration lungs, however there is persistent bronchial wall thickening with bandlike density RIGHT lung base. Mildly elevated RIGHT hemidiaphragm. No pleural effusion. No pneumothorax. Soft tissue planes and included osseous structures are non suspicious. IMPRESSION: Bronchitic changes with RIGHT lung base atelectasis, less likely pneumonia. Stable cardiomegaly. Electronically Signed   By: Awilda Metro M.D.   On: 11/12/2017 17:12   Ct Abdomen Pelvis W Contrast  Result Date: 11/12/2017 CLINICAL DATA:  Right upper quadrant pain x2 weeks, melena, history of renal stones. EXAM: CT ABDOMEN AND PELVIS WITH CONTRAST TECHNIQUE: Multidetector CT imaging of the abdomen  and pelvis was performed using the standard protocol following bolus administration of intravenous contrast. CONTRAST:  ISOVUE-300 IOPAMIDOL (ISOVUE-300) INJECTION 61% COMPARISON:  06/27/2017 FINDINGS: Lower chest: Lung bases are clear. Hepatobiliary: Nodular hepatic contour, suggesting cirrhosis. No focal hepatic lesion is seen. Layering 6 mm gallstone (series 2/image 42), without associated inflammatory changes. No intrahepatic or extrahepatic ductal dilatation. Pancreas: Within normal limits. Spleen: Spleen is normal in size. Adrenals/Urinary Tract: 1.7 cm right adrenal nodule (series 2/image 26; sagittal image 82), indeterminate, although favored to be present in 2013, suggesting a benign adrenal adenoma. Left adrenal gland is within normal limits. Multiple bilateral renal cysts, measuring up to 5.2 cm in the anterior interpolar right kidney (series 2/image 36). No hydronephrosis. Abnormal soft tissue/inflammation with heterogeneous enhancement of the right lower kidney (series 2/image 44). Secondary involvement of the right psoas muscle with associated centrally necrotic 3.8 x 5.7 x 6.6 cm lesion in the right psoas (series 2/image 47). Associated fluid/gas in the right retroperitoneum/posterior pararenal space, measuring up to 5.9 x 4.9 x 6.0 cm (series 2/image 54), although this does not reflect a well-defined fluid collection. Overall, given this constellation of findings, this appearance favors pyelonephritis with associated psoas abscess and gas-forming  infection extending into the retroperitoneum/posterior pararenal space. Technically speaking, some of this abnormal soft tissue and the right psoas abnormality could reflect tumor, but this would not account for the associated soft tissue inflammation/gas. Bladder is within normal limits. Stomach/Bowel: Stomach is within normal limits. No evidence of bowel obstruction. Normal appendix (series 2/image 62). Notably, the ascending colon/cecum and appendix  are remote from the fluid/gas in the posterior pararenal space. Vascular/Lymphatic: No evidence of abdominal aortic aneurysm. Atherosclerotic calcifications of the abdominal aorta and branch vessels. No suspicious abdominopelvic lymphadenopathy. Reproductive: Uterus is within normal limits. Bilateral ovaries are within normal limits. Other: No abdominopelvic ascites. Musculoskeletal: Right psoas lesion, as described above. Visualized osseous structures are within normal limits. IMPRESSION: Abnormal soft tissue/inflammation with heterogeneous enhancement of the right lower kidney, favoring pyelonephritis, with associated psoas abscess and gas-forming infection extending into the right retroperitoneum/posterior pararenal space. Suspected psoas abscess currently measures up to 6.6 cm. Ill-defined fluid/gas in the right posterior pararenal space measures to 6.0 cm, but this does not reflect a well-defined fluid collection. Additional ancillary findings as above, including cirrhosis, cholelithiasis, and bilateral renal cysts. These results were called by telephone at the time of interpretation on 11/12/2017 at 11:39 am to [facility] REDMON , who verbally acknowledged these results. Electronically Signed   By: Charline BillsSriyesh  Krishnan M.D.   On: 11/12/2017 11:41    Scheduled Meds: . clotrimazole  1 Applicatorful Vaginal QHS  . enoxaparin (LOVENOX) injection  30 mg Subcutaneous Q24H  . ezetimibe  10 mg Oral Daily  . hydrocortisone sod succinate (SOLU-CORTEF) inj  50 mg Intravenous Q12H  . insulin aspart  0-9 Units Subcutaneous TID WC  . insulin glargine  10 Units Subcutaneous Daily   And  . insulin glargine  10 Units Subcutaneous QHS  . levothyroxine  112 mcg Oral QAC breakfast  . Vitamin D (Ergocalciferol)  50,000 Units Oral Q7 days   Continuous Infusions: . sodium chloride 125 mL/hr at 11/13/17 0844  . ceFEPime (MAXIPIME) IV Stopped (11/13/17 0843)  . vancomycin      Active Problems:   Acute pyelonephritis    Psoas abscess (HCC)   Diabetes type 2, uncontrolled (HCC)   Autoimmune hepatitis (HCC)   Uncontrolled diabetes mellitus type 2 without complications (HCC)   Sepsis (HCC)   Acute lower UTI    Time spent: >35 minutes     Esperanza SheetsBURIEV, Durwood Dittus N  [facility] Pager [phone]. If 7PM-7AM, please contact night-coverage at [email or web address], password [email or web address] 11/13/2017, 8:21 AM  LOS: 1 day

## 2017-11-13 NOTE — Progress Notes (Signed)
PHARMACY - PHYSICIAN COMMUNICATION CRITICAL VALUE ALERT - BLOOD CULTURE IDENTIFICATION (BCID)  Dana LamerGloria W Thornton is an 62 y.o. female who [facility] with a chief complaint of pyelonephritis with associated psoas abscess and gas-forming infection extending into the retroperitoneum/posterior pararenal    Assessment:  Micro called with Staphylococcus species in 1/4 bottles (include suspected source if known)  Name of physician (or Provider) Contacted: Dr. York SpanielBuriev  Current antibiotics: Cefepime 2gm iv q12hr, Vancomycin 750mg  iv q24hr  Changes to prescribed antibiotics recommended:  Recommendations accepted by provider  No changes to antibiotics at this time, await final culture results  Results for orders placed or performed during the hospital encounter of 11/12/17  Blood Culture ID Panel (Reflexed) (Collected: 11/12/2017  2:49 PM)  Result Value Ref Range   Enterococcus species NOT DETECTED NOT DETECTED   Listeria monocytogenes NOT DETECTED NOT DETECTED   Staphylococcus species DETECTED (A) NOT DETECTED   Staphylococcus aureus NOT DETECTED NOT DETECTED   Methicillin resistance NOT DETECTED NOT DETECTED   Streptococcus species NOT DETECTED NOT DETECTED   Streptococcus agalactiae NOT DETECTED NOT DETECTED   Streptococcus pneumoniae NOT DETECTED NOT DETECTED   Streptococcus pyogenes NOT DETECTED NOT DETECTED   Acinetobacter baumannii NOT DETECTED NOT DETECTED   Enterobacteriaceae species NOT DETECTED NOT DETECTED   Enterobacter cloacae complex NOT DETECTED NOT DETECTED   Escherichia coli NOT DETECTED NOT DETECTED   Klebsiella oxytoca NOT DETECTED NOT DETECTED   Klebsiella pneumoniae NOT DETECTED NOT DETECTED   Proteus species NOT DETECTED NOT DETECTED   Serratia marcescens NOT DETECTED NOT DETECTED   Haemophilus influenzae NOT DETECTED NOT DETECTED   Neisseria meningitidis NOT DETECTED NOT DETECTED   Pseudomonas aeruginosa NOT DETECTED NOT DETECTED   Candida  albicans NOT DETECTED NOT DETECTED   Candida glabrata NOT DETECTED NOT DETECTED   Candida krusei NOT DETECTED NOT DETECTED   Candida parapsilosis NOT DETECTED NOT DETECTED   Candida tropicalis NOT DETECTED NOT DETECTED    Aleene DavidsonGrimsley Jr, Dana Thornton 11/13/2017  4:43 PM

## 2017-11-13 NOTE — ED Notes (Signed)
ED TO INPATIENT HANDOFF REPORT  Name/Age/Gender Dana Thornton 62 y.o. female  Code Status    Code Status Orders  (From admission, onward)        Start     Ordered   11/12/17 1637  Full code  Continuous     11/12/17 1638    Code Status History    Date Active Date Inactive Code Status Order ID Comments User Context   06/27/2017 10:07 07/03/2017 17:35 Full Code [id number]  Marijean Heath, NP ED   04/09/2015 20:33 04/13/2015 15:54 Full Code [id number]  Bonnielee Haff, MD Inpatient   03/29/2015 03:00 04/01/2015 14:11 Full Code [id number]  Roney Jaffe, MD Inpatient   12/03/2011 03:40 12/10/2011 17:59 Full Code [id number]  Colbert Coyer, MD Inpatient   12/03/2011 02:34 12/03/2011 03:40 Full Code [id number]  Carin Hock, MD ED    Advance Directive Documentation     Most Recent Value  Type of Advance Directive  Healthcare Power of Attorney  Pre-existing out of facility DNR order (yellow form or pink MOST form)  No data  "MOST" Form in Place?  No data      Home/SNF/Other Home  Chief Complaint sent by the dr   Level of Care/Admitting Diagnosis ED Disposition    ED Disposition Condition [facility]: [employer] [[id number]]  Level of Care: Stepdown [14]  Admit to SDU based on following criteria: Hemodynamic compromise or significant risk of instability:  Patient requiring short term acute titration and management of vasoactive drips, and invasive monitoring (i.e., CVP and Arterial line).  Diagnosis: Sepsis [facility]) [[id number]]  Admitting Physician: Kinnie Feil [[id number]]  Attending Physician: Kinnie Feil [[id number]]  Estimated length of stay: past midnight tomorrow  Certification:: I certify this patient will need inpatient services for at least 2 midnights  PT Class (Do Not Modify): Inpatient [101]  PT Acc Code (Do Not Modify): Private [1]       Medical History Past Medical History:  Diagnosis Date  .  Hypercholesterolemia   . Hypertension   . Kidney stones   . Type II diabetes mellitus (HCC)     Allergies Allergies  Allergen Reactions  . Codeine Nausea And Vomiting    IV Location/Drains/Wounds Patient Lines/Drains/Airways Status   Active Line/Drains/Airways    Name:   Placement date:   Placement time:   Site:   Days:   Peripheral IV 06/27/17 Left Antecubital   06/27/17    0445    Antecubital   139   Peripheral IV 11/12/17 Left Antecubital   11/12/17    1423    Antecubital   1          Labs/Imaging Results for orders placed or performed during the hospital encounter of 11/12/17 (from the past 48 hour(s))  Urinalysis, Routine w reflex microscopic- may I&O cath if menses     Status: Abnormal   Collection Time: 11/12/17 12:52 PM  Result Value Ref Range   Color, Urine YELLOW YELLOW   APPearance CLEAR CLEAR   Specific Gravity, Urine >1.046 (H) 1.005 - 1.030   pH 6.0 5.0 - 8.0   Glucose, UA >=500 (A) NEGATIVE mg/dL   Hgb urine dipstick SMALL (A) NEGATIVE   Bilirubin Urine NEGATIVE NEGATIVE   Ketones, ur NEGATIVE NEGATIVE mg/dL   Protein, ur NEGATIVE NEGATIVE mg/dL   Nitrite POSITIVE (A) NEGATIVE   Leukocytes, UA SMALL (A) NEGATIVE   RBC / HPF 0-5 0 - 5 RBC/hpf  WBC, UA TOO NUMEROUS TO COUNT 0 - 5 WBC/hpf   Bacteria, UA RARE (A) NONE SEEN   Squamous Epithelial / LPF 0-5 (A) NONE SEEN    Comment: Performed at [facility], [place] [address], [place], [place] [address]  CBC     Status: Abnormal   Collection Time: 11/12/17 12:52 PM  Result Value Ref Range   WBC 34.1 (H) 4.0 - 10.5 K/uL   RBC 4.27 3.87 - 5.11 MIL/uL   Hemoglobin 12.7 12.0 - 15.0 g/dL   HCT 37.0 36.0 - 46.0 %   MCV 86.7 78.0 - 100.0 fL   MCH 29.7 26.0 - 34.0 pg   MCHC 34.3 30.0 - 36.0 g/dL   RDW 14.5 11.5 - 15.5 %   Platelets 297 150 - 400 K/uL    Comment: Performed at [facility], [place] [address], [place], [place] [address]  Comprehensive metabolic panel     Status:  Abnormal   Collection Time: 11/12/17  1:47 PM  Result Value Ref Range   Sodium 132 (L) 135 - 145 mmol/L   Potassium 5.7 (H) 3.5 - 5.1 mmol/L    Comment: SLIGHT HEMOLYSIS   Chloride 98 (L) 101 - 111 mmol/L   CO2 25 22 - 32 mmol/L   Glucose, Bld 520 (HH) 65 - 99 mg/dL    Comment: CRITICAL RESULT CALLED TO, READ BACK BY AND VERIFIED WITH: RAND,K @ 1513 ON [id number] BY POTEAT,S    BUN 23 (H) 6 - 20 mg/dL   Creatinine, Ser 1.39 (H) 0.44 - 1.00 mg/dL   Calcium 9.1 8.9 - 10.3 mg/dL   Total Protein 8.8 (H) 6.5 - 8.1 g/dL   Albumin 2.3 (L) 3.5 - 5.0 g/dL   AST 41 15 - 41 U/L   ALT 33 14 - 54 U/L   Alkaline Phosphatase 191 (H) 38 - 126 U/L   Total Bilirubin 1.5 (H) 0.3 - 1.2 mg/dL   GFR calc non Af Amer 40 (L) >60 mL/min   GFR calc Af Amer 46 (L) >60 mL/min    Comment: (NOTE) The eGFR has been calculated using the CKD EPI equation. This calculation has not been validated in all clinical situations. eGFR's persistently <60 mL/min signify possible Chronic Kidney Disease.    Anion gap 9 5 - 15    Comment: Performed at [facility], [place] [address] [place], [place] [address]  Protime-INR     Status: Abnormal   Collection Time: 11/12/17  1:47 PM  Result Value Ref Range   Prothrombin Time 15.8 (H) 11.4 - 15.2 seconds   INR 1.27     Comment: Performed at [facility], [place] [address], [place],  [address]  I-Stat CG4 Lactic Acid, ED     Status: Abnormal   Collection Time: 11/12/17  2:26 PM  Result Value Ref Range   Lactic Acid, Venous 2.91 (HH) 0.5 - 1.9 mmol/L   Comment NOTIFIED PHYSICIAN   POC CBG, ED     Status: Abnormal   Collection Time: 11/12/17  4:05 PM  Result Value Ref Range   Glucose-Capillary 553 (HH) 65 - 99 mg/dL   Comment 1 Call MD NNP PA CNM   I-Stat CG4 Lactic Acid, ED     Status: Abnormal   Collection Time: 11/12/17  4:16 PM  Result Value Ref Range   Lactic Acid, Venous 2.44 (HH) 0.5 - 1.9 mmol/L   Comment NOTIFIED PHYSICIAN    Influenza panel by PCR (  type A & B)     Status: None   Collection Time: 11/12/17  4:48 PM  Result Value Ref Range   Influenza A By PCR NEGATIVE NEGATIVE   Influenza B By PCR NEGATIVE NEGATIVE    Comment: (NOTE) The Xpert Xpress Flu assay is intended as an aid in the diagnosis of  influenza and should not be used as a sole basis for treatment.  This  assay is FDA approved for nasopharyngeal swab specimens only. Nasal  washings and aspirates are unacceptable for Xpert Xpress Flu testing. Performed at [facility], [place] [address] [place], [place] [address]   TSH     Status: Abnormal   Collection Time: 11/12/17  5:01 PM  Result Value Ref Range   TSH 7.408 (H) 0.350 - 4.500 uIU/mL    Comment: Performed by a 3rd Generation assay with a functional sensitivity of <=0.01 uIU/mL. Performed at [facility], [place] [address], [place], [place] [address]   Hemoglobin A1c     Status: Abnormal   Collection Time: 11/12/17  5:01 PM  Result Value Ref Range   Hgb A1c MFr Bld 12.0 (H) 4.8 - 5.6 %    Comment: (NOTE) Pre diabetes:          5.7%-6.4% Diabetes:              >6.4% Glycemic control for   <7.0% adults with diabetes    Mean Plasma Glucose 297.7 mg/dL    Comment: Performed at [place] [address] [place], [place] [address]  CBG monitoring, ED     Status: Abnormal   Collection Time: 11/12/17  5:19 PM  Result Value Ref Range   Glucose-Capillary 468 (H) 65 - 99 mg/dL  POC CBG, ED     Status: Abnormal   Collection Time: 11/12/17  7:35 PM  Result Value Ref Range   Glucose-Capillary 467 (H) 65 - 99 mg/dL  I-Stat CG4 Lactic Acid, ED     Status: Abnormal   Collection Time: 11/12/17  7:45 PM  Result Value Ref Range   Lactic Acid, Venous 3.03 (HH) 0.5 - 1.9 mmol/L   Comment NOTIFIED PHYSICIAN   POC CBG, ED     Status: Abnormal   Collection Time: 11/12/17  9:56 PM  Result Value Ref Range   Glucose-Capillary 459 (H) 65 - 99 mg/dL   Comment  1 Notify RN   Lactic acid, plasma     Status: None   Collection Time: 11/12/17 10:08 PM  Result Value Ref Range   Lactic Acid, Venous 1.9 0.5 - 1.9 mmol/L    Comment: Performed at [facility], [place] [address], [place], [place] [address]  I-Stat CG4 Lactic Acid, ED     Status: Abnormal   Collection Time: 11/12/17 10:14 PM  Result Value Ref Range   Lactic Acid, Venous 2.01 (HH) 0.5 - 1.9 mmol/L   Comment NOTIFIED PHYSICIAN   CBG monitoring, ED     Status: Abnormal   Collection Time: 11/12/17 11:37 PM  Result Value Ref Range   Glucose-Capillary 429 (H) 65 - 99 mg/dL   Dg Chest 2 View  Result Date: 11/12/2017 CLINICAL DATA:  Night sweats for 2 weeks. History of diabetes and hypertension. EXAM: CHEST - 2 VIEW COMPARISON:  Chest radiograph July 07, 2017 FINDINGS: Cardiac silhouette is mildly enlarged. Improved aeration lungs, however there is persistent bronchial wall thickening with bandlike density RIGHT lung base. Mildly elevated RIGHT hemidiaphragm. No pleural  effusion. No pneumothorax. Soft tissue planes and included osseous structures are non suspicious. IMPRESSION: Bronchitic changes with RIGHT lung base atelectasis, less likely pneumonia. Stable cardiomegaly. Electronically Signed   By: Elon Alas M.D.   On: 11/12/2017 17:12   Ct Abdomen Pelvis W Contrast  Result Date: 11/12/2017 CLINICAL DATA:  Right upper quadrant pain x2 weeks, melena, history of renal stones. EXAM: CT ABDOMEN AND PELVIS WITH CONTRAST TECHNIQUE: Multidetector CT imaging of the abdomen and pelvis was performed using the standard protocol following bolus administration of intravenous contrast. CONTRAST:  126m ISOVUE-300 IOPAMIDOL (ISOVUE-300) INJECTION 61% COMPARISON:  06/27/2017 FINDINGS: Lower chest: Lung bases are clear. Hepatobiliary: Nodular hepatic contour, suggesting cirrhosis. No focal hepatic lesion is seen. Layering 6 mm gallstone (series 2/image 42), without associated inflammatory  changes. No intrahepatic or extrahepatic ductal dilatation. Pancreas: Within normal limits. Spleen: Spleen is normal in size. Adrenals/Urinary Tract: 1.7 cm right adrenal nodule (series 2/image 26; sagittal image 82), indeterminate, although favored to be present in 2013, suggesting a benign adrenal adenoma. Left adrenal gland is within normal limits. Multiple bilateral renal cysts, measuring up to 5.2 cm in the anterior interpolar right kidney (series 2/image 36). No hydronephrosis. Abnormal soft tissue/inflammation with heterogeneous enhancement of the right lower kidney (series 2/image 44). Secondary involvement of the right psoas muscle with associated centrally necrotic 3.8 x 5.7 x 6.6 cm lesion in the right psoas (series 2/image 47). Associated fluid/gas in the right retroperitoneum/posterior pararenal space, measuring up to 5.9 x 4.9 x 6.0 cm (series 2/image 54), although this does not reflect a well-defined fluid collection. Overall, given this constellation of findings, this appearance favors pyelonephritis with associated psoas abscess and gas-forming infection extending into the retroperitoneum/posterior pararenal space. Technically speaking, some of this abnormal soft tissue and the right psoas abnormality could reflect tumor, but this would not account for the associated soft tissue inflammation/gas. Bladder is within normal limits. Stomach/Bowel: Stomach is within normal limits. No evidence of bowel obstruction. Normal appendix (series 2/image 62). Notably, the ascending colon/cecum and appendix are remote from the fluid/gas in the posterior pararenal space. Vascular/Lymphatic: No evidence of abdominal aortic aneurysm. Atherosclerotic calcifications of the abdominal aorta and branch vessels. No suspicious abdominopelvic lymphadenopathy. Reproductive: Uterus is within normal limits. Bilateral ovaries are within normal limits. Other: No abdominopelvic ascites. Musculoskeletal: Right psoas lesion, as  described above. Visualized osseous structures are within normal limits. IMPRESSION: Abnormal soft tissue/inflammation with heterogeneous enhancement of the right lower kidney, favoring pyelonephritis, with associated psoas abscess and gas-forming infection extending into the right retroperitoneum/posterior pararenal space. Suspected psoas abscess currently measures up to 6.6 cm. Ill-defined fluid/gas in the right posterior pararenal space measures to 6.0 cm, but this does not reflect a well-defined fluid collection. Additional ancillary findings as above, including cirrhosis, cholelithiasis, and bilateral renal cysts. These results were called by telephone at the time of interpretation on 11/12/2017 at 11:39 am to [facility], who verbally acknowledged these results. Electronically Signed   By: SJulian HyM.D.   On: 11/12/2017 11:41    Pending Labs Unresulted Labs (From admission, onward)   Start     Ordered   11/13/17 0500  Comprehensive metabolic panel  Tomorrow morning,   R     11/12/17 1638   11/13/17 0500  CBC  Tomorrow morning,   R     11/12/17 1638   11/12/17 1900  Lactic acid, plasma  STAT Now then every 3 hours,   R     11/12/17 1701  11/12/17 1610  Urine culture  STAT,   STAT     11/12/17 1609   11/12/17 1347  Blood culture (routine x 2)  BLOOD CULTURE X 2,   STAT     11/12/17 1347      Vitals/Pain Today's Vitals   11/12/17 2220 11/12/17 2231 11/12/17 2320 11/13/17 0104  BP:  (!) 143/93  129/81  Pulse:  93  93  Resp:  20  (!) 24  Temp:      TempSrc:      SpO2:  96%  94%  PainSc: 10-Worst pain ever  5      Isolation Precautions Droplet precaution  Medications Medications  ezetimibe (ZETIA) tablet 10 mg (10 mg Oral Given 11/12/17 1818)  levothyroxine (SYNTHROID, LEVOTHROID) tablet 112 mcg (not administered)  hydrocortisone sodium succinate (SOLU-CORTEF) 100 MG injection 50 mg (50 mg Intravenous Given 11/12/17 1822)  clotrimazole (GYNE-LOTRIMIN) vaginal cream 1  Applicatorful (not administered)  Vitamin D (Ergocalciferol) (DRISDOL) capsule 50,000 Units (not administered)  enoxaparin (LOVENOX) injection 30 mg (not administered)  insulin aspart (novoLOG) injection 0-9 Units (0 Units Subcutaneous Not Given 11/12/17 1833)  0.9 %  sodium chloride infusion ( Intravenous New Bag/Given 11/12/17 1819)  fentaNYL (SUBLIMAZE) injection 25 mcg (25 mcg Intravenous Given 11/13/17 0219)  acetaminophen (TYLENOL) tablet 650 mg (not administered)  insulin glargine (LANTUS) injection 30 Units (not administered)    And  insulin glargine (LANTUS) injection 10 Units (10 Units Subcutaneous Given 11/12/17 1839)  ceFEPIme (MAXIPIME) 2 g in sodium chloride 0.9 % 100 mL IVPB (0 g Intravenous Stopped 11/12/17 2141)  vancomycin (VANCOCIN) IVPB 750 mg/150 ml premix (not administered)  sodium chloride 0.9 % bolus 1,000 mL (0 mLs Intravenous Stopped 11/12/17 1834)  fentaNYL (SUBLIMAZE) injection 50 mcg (50 mcg Intravenous Given 11/12/17 1425)  ondansetron (ZOFRAN) injection 4 mg (4 mg Intravenous Given 11/12/17 1426)  piperacillin-tazobactam (ZOSYN) IVPB 3.375 g (0 g Intravenous Stopped 11/12/17 1554)  vancomycin (VANCOCIN) 2,500 mg in sodium chloride 0.9 % 500 mL IVPB (0 mg Intravenous Stopped 11/12/17 1741)  fentaNYL (SUBLIMAZE) injection 25 mcg (25 mcg Intravenous Given 11/12/17 1526)  insulin aspart (novoLOG) injection 11 Units (11 Units Subcutaneous Given 11/12/17 1616)  insulin aspart (novoLOG) injection 11 Units (11 Units Subcutaneous Given 11/12/17 1818)  insulin aspart (novoLOG) injection 10 Units (10 Units Subcutaneous Given 11/13/17 0101)    Mobility walks

## 2017-11-13 NOTE — Consult Note (Signed)
Chief Complaint: Patient was seen in consultation today for CT-guided right psoas abscess drain placement Chief Complaint  Patient presents with  . Flank Pain    Referring Physician(s): Manny,T  Supervising Physician: Oley Balm  Patient Status: [facility] [place] - In-pt  History of Present Illness: Dana Thornton is a 62 y.o. female with past medical history of hypertension, nephrolithiasis, diabetes, hypercholesterolemia, autoimmune hepatitis who presented to [facility] yesterday with progressively worsening right flank pain with radiation to right lower abdomen and upper thigh region.  Subsequent imaging revealed abnormal soft tissue/inflammation right lower kidney with associated psoas abscess and gas forming infection extending into the right retroperitoneum/posterior pararenal space.  Also noted for findings including cirrhosis, cholelithiasis and bilateral renal cysts.  Request now received from urology for image guided drainage of the right psoas abscess.  Current labs include WBC 32.3, hemoglobin 10.7, platelets 253k, creatinine 1.35, potassium 4.6, PT 15.8, INR 1.27.  She is currently afebrile.  Past Medical History:  Diagnosis Date  . Hypercholesterolemia   . Hypertension   . Kidney stones   . Type II diabetes mellitus (HCC)     Past Surgical History:  Procedure Laterality Date  . BREAST EXCISIONAL BIOPSY    . CESAREAN SECTION  1999  . DILATION AND CURETTAGE OF UTERUS     "I lost a baby"  . TUBAL LIGATION  1999    Allergies: Codeine  Medications: Prior to Admission medications   Medication Sig Start Date End Date Taking? Authorizing Provider  albuterol (PROVENTIL HFA;VENTOLIN HFA) 108 (90 Base) MCG/ACT inhaler Inhale 2 puffs into the lungs every 6 (six) hours as needed for wheezing or shortness of breath. 07/03/17  Yes Rhetta Mura, MD  amLODipine (NORVASC) 10 MG tablet Take 10 mg by mouth daily.   Yes [provider]  carvedilol (COREG)  6.25 MG tablet Take 6.25 mg by mouth 2 (two) times daily with a meal.   Yes [provider]  ezetimibe (ZETIA) 10 MG tablet Take 10 mg by mouth daily.   Yes [provider]  insulin aspart (NOVOLOG) 100 UNIT/ML injection Before each meal 3 times a day, 120-150 - 2 units, 151-200 - 4 units, 201-250 - 8 units, 251-300 - 10 units,  301 or above 10 units and call your MD Patient taking differently: Inject 10 Units into the skin 2 (two) times daily.  12/10/11  Yes Leroy Sea, MD  Insulin Detemir (LEVEMIR FLEXPEN ) Inject 28 Units into the skin at bedtime.   Yes [provider]  insulin glargine (LANTUS) 100 UNIT/ML injection Take 30 Units in the morning and 10 units at Night. Patient taking differently: Inject 10 Units into the skin 2 (two) times daily.  04/13/15  Yes Osvaldo Shipper, MD  levothyroxine (SYNTHROID, LEVOTHROID) 150 MCG tablet Take 1 tablet (150 mcg total) by mouth daily before breakfast. Patient taking differently: Take 112 mcg by mouth daily before breakfast.  04/13/15  Yes Osvaldo Shipper, MD  losartan (COZAAR) 100 MG tablet Take 100 mg by mouth daily.   Yes [provider]  mercaptopurine (PURINETHOL) 50 MG tablet Take 150 mg by mouth daily. Give on an empty stomach 1 hour before or 2 hours after meals. Caution: Chemotherapy.   Yes [provider]  naproxen sodium (ALEVE) 220 MG tablet Take 220 mg by mouth daily as needed (PAIN).   Yes [provider]  predniSONE (DELTASONE) 20 MG tablet Take 2 tablets (40 mg total) by mouth daily with breakfast. Patient  taking differently: Take 10 mg by mouth daily with breakfast.  04/13/15  Yes Osvaldo ShipperKrishnan, Gokul, MD  terconazole (TERAZOL 7) 0.4 % vaginal cream Place 1 applicator vaginally daily. 10/21/17  Yes [provider]  Vitamin D, Ergocalciferol, (DRISDOL) 50000 units CAPS capsule Take 50,000 Units by mouth every 7 (seven) days.   Yes [provider]  cefUROXime (CEFTIN) 500 MG  tablet Take 1 tablet (500 mg total) by mouth daily with breakfast. Patient not taking: Reported on 11/12/2017 07/03/17   Rhetta MuraSamtani, Jai-Gurmukh, MD  Spacer/Aero-Holding Chambers (BREATHERITE COLL SPACER ADULT) MISC 1 Device by Does not apply route every 6 (six) hours as needed. 07/03/17   Rhetta MuraSamtani, Jai-Gurmukh, MD     Family History  Problem Relation Age of Onset  . Kidney failure Father   . Hypertension Father     Social History   Socioeconomic History  . Marital status: Married    Spouse name: None  . Number of children: None  . Years of education: None  . Highest education level: None  Social Needs  . Financial resource strain: None  . Food insecurity - worry: None  . Food insecurity - inability: None  . Transportation needs - medical: None  . Transportation needs - non-medical: None  Occupational History  . None  Tobacco Use  . Smoking status: Never Smoker  . Smokeless tobacco: Never Used  Substance and Sexual Activity  . Alcohol use: No  . Drug use: No  . Sexual activity: Yes    Partners: Male  Other Topics Concern  . None  Social History Narrative  . None      Review of Systems see above; he denies fever, headache, chest pain, dyspnea, cough, nausea, vomiting or bleeding.   Vital Signs: BP 139/79   Pulse 91   Temp 98.1 F (36.7 C) (Oral)   Resp (!) 21   Ht 5\' 2"  (1.575 m)   Wt 213 lb 3 oz (96.7 kg)   SpO2 94%   BMI 38.99 kg/m   Physical Exam awake, alert.  Chest with slight diminished breath sounds right base, left clear.  Heart with regular rate and rhythm.  Abdomen soft, positive bowel sounds, tender right lower /lateral abdominal/inguinal and right flank regions; no lower extremity edema.  Imaging: Dg Chest 2 View  Result Date: 11/12/2017 CLINICAL DATA:  Night sweats for 2 weeks. History of diabetes and hypertension. EXAM: CHEST - 2 VIEW COMPARISON:  Chest radiograph July 07, 2017 FINDINGS: Cardiac silhouette is mildly enlarged. Improved aeration  lungs, however there is persistent bronchial wall thickening with bandlike density RIGHT lung base. Mildly elevated RIGHT hemidiaphragm. No pleural effusion. No pneumothorax. Soft tissue planes and included osseous structures are non suspicious. IMPRESSION: Bronchitic changes with RIGHT lung base atelectasis, less likely pneumonia. Stable cardiomegaly. Electronically Signed   By: Awilda Metroourtnay  Bloomer M.D.   On: 11/12/2017 17:12   Ct Abdomen Pelvis W Contrast  Result Date: 11/12/2017 CLINICAL DATA:  Right upper quadrant pain x2 weeks, melena, history of renal stones. EXAM: CT ABDOMEN AND PELVIS WITH CONTRAST TECHNIQUE: Multidetector CT imaging of the abdomen and pelvis was performed using the standard protocol following bolus administration of intravenous contrast. CONTRAST:  125mL ISOVUE-300 IOPAMIDOL (ISOVUE-300) INJECTION 61% COMPARISON:  06/27/2017 FINDINGS: Lower chest: Lung bases are clear. Hepatobiliary: Nodular hepatic contour, suggesting cirrhosis. No focal hepatic lesion is seen. Layering 6 mm gallstone (series 2/image 42), without associated inflammatory changes. No intrahepatic or extrahepatic ductal dilatation. Pancreas: Within normal limits. Spleen: Spleen is normal  in size. Adrenals/Urinary Tract: 1.7 cm right adrenal nodule (series 2/image 26; sagittal image 82), indeterminate, although favored to be present in 2013, suggesting a benign adrenal adenoma. Left adrenal gland is within normal limits. Multiple bilateral renal cysts, measuring up to 5.2 cm in the anterior interpolar right kidney (series 2/image 36). No hydronephrosis. Abnormal soft tissue/inflammation with heterogeneous enhancement of the right lower kidney (series 2/image 44). Secondary involvement of the right psoas muscle with associated centrally necrotic 3.8 x 5.7 x 6.6 cm lesion in the right psoas (series 2/image 47). Associated fluid/gas in the right retroperitoneum/posterior pararenal space, measuring up to 5.9 x 4.9 x 6.0 cm  (series 2/image 54), although this does not reflect a well-defined fluid collection. Overall, given this constellation of findings, this appearance favors pyelonephritis with associated psoas abscess and gas-forming infection extending into the retroperitoneum/posterior pararenal space. Technically speaking, some of this abnormal soft tissue and the right psoas abnormality could reflect tumor, but this would not account for the associated soft tissue inflammation/gas. Bladder is within normal limits. Stomach/Bowel: Stomach is within normal limits. No evidence of bowel obstruction. Normal appendix (series 2/image 62). Notably, the ascending colon/cecum and appendix are remote from the fluid/gas in the posterior pararenal space. Vascular/Lymphatic: No evidence of abdominal aortic aneurysm. Atherosclerotic calcifications of the abdominal aorta and branch vessels. No suspicious abdominopelvic lymphadenopathy. Reproductive: Uterus is within normal limits. Bilateral ovaries are within normal limits. Other: No abdominopelvic ascites. Musculoskeletal: Right psoas lesion, as described above. Visualized osseous structures are within normal limits. IMPRESSION: Abnormal soft tissue/inflammation with heterogeneous enhancement of the right lower kidney, favoring pyelonephritis, with associated psoas abscess and gas-forming infection extending into the right retroperitoneum/posterior pararenal space. Suspected psoas abscess currently measures up to 6.6 cm. Ill-defined fluid/gas in the right posterior pararenal space measures to 6.0 cm, but this does not reflect a well-defined fluid collection. Additional ancillary findings as above, including cirrhosis, cholelithiasis, and bilateral renal cysts. These results were called by telephone at the time of interpretation on 11/12/2017 at 11:39 am to [facility] , who verbally acknowledged these results. Electronically Signed   By: Charline Bills M.D.   On: 11/12/2017 11:41     Labs:  CBC: Recent Labs    07/03/17 0323 11/12/17 1252 11/13/17 0336 11/13/17 0451  WBC 25.8* 34.1* 35.3* 32.3*  HGB 9.6* 12.7 11.6* 10.7*  HCT 27.2* 37.0 34.0* 31.7*  PLT 81* 297 251 253    COAGS: Recent Labs    06/26/17 2156 11/12/17 1347  INR 1.67 1.27  APTT 39*  --     BMP: Recent Labs    07/03/17 0323 11/12/17 1347 11/13/17 0336 11/13/17 0451  NA 137 132* 133* 137  K 3.4* 5.7* 4.6 4.6  CL 106 98* 104 107  CO2 21* 25 22 24   GLUCOSE 215* 520* 428* 424*  BUN 57* 23* 25* 24*  CALCIUM 7.2* 9.1 8.3* 8.6*  CREATININE 2.35* 1.39* 1.31* 1.35*  GFRNONAA 21* 40* 43* 41*  GFRAA 25* 46* 50* 48*    LIVER FUNCTION TESTS: Recent Labs    06/30/17 1234 07/01/17 0353 07/03/17 0323 11/12/17 1347 11/13/17 0336  BILITOT 3.9* 2.3* 1.7* 1.5* 0.9  AST 104*  --  63* 41 25  ALT 49  --  41 33 27  ALKPHOS 260*  --  313* 191* 159*  PROT 6.8  --  7.7 8.8* 7.8  ALBUMIN 1.2*  --  1.5* 2.3* 2.0*    TUMOR MARKERS: No results for input(s): AFPTM, CEA, CA199, CHROMGRNA  in the last 8760 hours.  Assessment and Plan: 62 y.o. female with past medical history of hypertension, nephrolithiasis, diabetes, hypercholesterolemia, autoimmune hepatitis who presented to [facility] yesterday with progressively worsening right flank pain with radiation to right lower abdomen and upper thigh region.  Subsequent imaging revealed abnormal soft tissue/inflammation right lower kidney with associated psoas abscess and gas forming infection extending into the right retroperitoneum/posterior pararenal space.  Also noted for findings including cirrhosis, cholelithiasis and bilateral renal cysts.  Request now received from urology for image guided drainage of the right psoas abscess.  Current labs include WBC 32.3, hemoglobin 10.7, platelets 253k, creatinine 1.35, potassium 4.6, PT 15.8, INR 1.27.  She is currently afebrile.  Imaging studies were reviewed by Dr. Fredia Sorrow and psoas abscess  appears amenable to drainage.Risks and benefits discussed with the patient/family including bleeding, infection, damage to adjacent structures, bowel perforation/fistula connection, and sepsis.  All of the patient's questions were answered, patient is agreeable to proceed. Consent signed and in chart.     Thank you for this interesting consult.  I greatly enjoyed meeting JARIELYS GIRARDOT and look forward to participating in their care.  A copy of this report was sent to the requesting provider on this date.  Electronically Signed: D. Jeananne Rama, PA-C 11/13/2017, 9:23 AM   I spent a total of 30 minutes  in face to face in clinical consultation, greater than 50% of which was counseling/coordinating care for CT-guided drainage of right psoas abscess

## 2017-11-13 NOTE — Procedures (Signed)
  Procedure: CT R psoas abscess drain  3045f EBL:   minimal Complications:  none immediate  See full dictation in [facility] PACS.  Thora Lance. Mylon Mabey MD Main # [phone] Pager  [phone]

## 2017-11-14 ENCOUNTER — Inpatient Hospital Stay (HOSPITAL_COMMUNITY): Payer: 59

## 2017-11-14 DIAGNOSIS — L0291 Cutaneous abscess, unspecified: Secondary | ICD-10-CM

## 2017-11-14 DIAGNOSIS — N151 Renal and perinephric abscess: Secondary | ICD-10-CM

## 2017-11-14 LAB — CBC
HCT: 32.2 % — ABNORMAL LOW (ref 36.0–46.0)
HEMOGLOBIN: 10.8 g/dL — AB (ref 12.0–15.0)
MCH: 29.6 pg (ref 26.0–34.0)
MCHC: 33.5 g/dL (ref 30.0–36.0)
MCV: 88.2 fL (ref 78.0–100.0)
Platelets: 253 10*3/uL (ref 150–400)
RBC: 3.65 MIL/uL — ABNORMAL LOW (ref 3.87–5.11)
RDW: 14.7 % (ref 11.5–15.5)
WBC: 28.3 10*3/uL — ABNORMAL HIGH (ref 4.0–10.5)

## 2017-11-14 LAB — GLUCOSE, CAPILLARY
GLUCOSE-CAPILLARY: 335 mg/dL — AB (ref 65–99)
GLUCOSE-CAPILLARY: 476 mg/dL — AB (ref 65–99)
Glucose-Capillary: 401 mg/dL — ABNORMAL HIGH (ref 65–99)
Glucose-Capillary: 336 mg/dL — ABNORMAL HIGH (ref 65–99)
Glucose-Capillary: 269 mg/dL — ABNORMAL HIGH (ref 65–99)
Glucose-Capillary: 277 mg/dL — ABNORMAL HIGH (ref 65–99)

## 2017-11-14 LAB — URINE CULTURE

## 2017-11-14 LAB — BASIC METABOLIC PANEL
Anion gap: 6 (ref 5–15)
BUN: 24 mg/dL — AB (ref 6–20)
CHLORIDE: 107 mmol/L (ref 101–111)
CO2: 21 mmol/L — AB (ref 22–32)
CREATININE: 1.26 mg/dL — AB (ref 0.44–1.00)
Calcium: 8.1 mg/dL — ABNORMAL LOW (ref 8.9–10.3)
GFR calc Af Amer: 52 mL/min — ABNORMAL LOW (ref >60)
GFR calc non Af Amer: 45 mL/min — ABNORMAL LOW (ref >60)
Glucose, Bld: 455 mg/dL — ABNORMAL HIGH (ref 65–99)
Potassium: 4.7 mmol/L (ref 3.5–5.1)
SODIUM: 134 mmol/L — AB (ref 135–145)

## 2017-11-14 MED ORDER — INSULIN ASPART 100 UNIT/ML ~~LOC~~ SOLN
0.0000 [IU] | Freq: Three times a day (TID) | SUBCUTANEOUS | Status: DC
  Administered 2017-11-14: 15 [IU] via SUBCUTANEOUS
  Administered 2017-11-14: 11 [IU] via SUBCUTANEOUS
  Administered 2017-11-15: 15 [IU] via SUBCUTANEOUS
  Administered 2017-11-15 (×2): 20 [IU] via SUBCUTANEOUS
  Administered 2017-11-16: 15 [IU] via SUBCUTANEOUS
  Administered 2017-11-16: 20 [IU] via SUBCUTANEOUS
  Administered 2017-11-16: 7 [IU] via SUBCUTANEOUS
  Administered 2017-11-17: 4 [IU] via SUBCUTANEOUS
  Administered 2017-11-17: 15 [IU] via SUBCUTANEOUS

## 2017-11-14 MED ORDER — INSULIN ASPART 100 UNIT/ML ~~LOC~~ SOLN
0.0000 [IU] | Freq: Every day | SUBCUTANEOUS | Status: DC
  Administered 2017-11-14: 3 [IU] via SUBCUTANEOUS
  Administered 2017-11-15: 2 [IU] via SUBCUTANEOUS
  Administered 2017-11-16: 3 [IU] via SUBCUTANEOUS

## 2017-11-14 MED ORDER — SODIUM CHLORIDE 0.9% FLUSH
5.0000 mL | Freq: Three times a day (TID) | INTRAVENOUS | Status: DC
  Administered 2017-11-14 – 2017-11-17 (×10): 5 mL

## 2017-11-14 MED ORDER — INSULIN ASPART 100 UNIT/ML ~~LOC~~ SOLN
15.0000 [IU] | Freq: Once | SUBCUTANEOUS | Status: AC
  Administered 2017-11-14: 15 [IU] via SUBCUTANEOUS

## 2017-11-14 MED ORDER — OXYCODONE HCL 5 MG PO TABS
5.0000 mg | ORAL_TABLET | Freq: Four times a day (QID) | ORAL | Status: DC | PRN
  Administered 2017-11-14: 10 mg via ORAL
  Administered 2017-11-15 – 2017-11-16 (×5): 5 mg via ORAL
  Administered 2017-11-17: 10 mg via ORAL
  Administered 2017-11-17: 5 mg via ORAL
  Filled 2017-11-14 (×4): qty 1
  Filled 2017-11-14: qty 2
  Filled 2017-11-14: qty 1
  Filled 2017-11-14: qty 2
  Filled 2017-11-14: qty 1

## 2017-11-14 NOTE — Progress Notes (Signed)
Transferred to 1441 via wheelchair. Report given to RN

## 2017-11-14 NOTE — Progress Notes (Signed)
Inpatient Diabetes Program Recommendations  AACE/ADA: New Consensus Statement on Inpatient Glycemic Control (2015)  Target Ranges:  Prepandial:   less than 140 mg/dL      Peak postprandial:   less than 180 mg/dL (1-2 hours)      Critically ill patients:  140 - 180 mg/dL   Lab Results  Component Value Date   GLUCAP 401 (H) 11/14/2017   HGBA1C 12.0 (H) 11/12/2017    Review of Glycemic Control  Blood sugars in 400s this am and yesterday. On Solucortef 25 units Q12H.   Inpatient Diabetes Program Recommendations:     Add Novolog 8 units tidwc Increase Lantus to 15 units QAM and 30 units QHS  Will titrate when steroids are decreased.  Will continue to follow.  Thank you. Ailene Ardshonda Alba Kriesel, RD, LDN, CDE Inpatient Diabetes Coordinator [phone]

## 2017-11-14 NOTE — Progress Notes (Signed)
Subjective/Chief Complaint:   1 - Severe Right Kidney Infection / Psoas Abscess - large renal / peri-renal / psoas abscess containing gas - phlegmon by CT 11/2017 on eval night sweats and flank pain. No hydro / stones / delayed nephrogram. She is diabetic. BCX 3/5 - pending UCX 3/5 - pan-sensitive e. Coli. Drain CX 3/6 GNR / pending. Initial leukocytosis to 34k, lactat 2.9, no recent A1c for review, but glucose  553 at presentation. She started prednisone 2 weeks ago for autoimmune hepatitis and sugars have been "crazy" since. Perc IR drain placed 3/6.     Today "Niem[place]" is stable WBC trending down. Now on maxipime. Sugars remain quite high, but improving.      Objective: Vital signs in last 24 hours: Temp:  [97.6 F (36.4 C)-98.5 F (36.9 C)] 97.7 F (36.5 C) (03/07 0301) Pulse Rate:  [63-105] 69 (03/07 0700) Resp:  [10-29] 20 (03/07 0000) BP: (124-188)/(68-100) 181/82 (03/07 0700) SpO2:  [90 %-100 %] 97 % (03/07 0700) Weight:  [96.7 kg (213 lb 3 oz)] 96.7 kg (213 lb 3 oz) (03/06 6295)    Physical Exam  Constitutional: She appears well-developed.    HENT:  Head: Normocephalic.  Eyes: Pupils are equal, round, and reactive to light.  Neck: Normal range of motion.  Cardiovascular: Improved tachycardia.  Respiratory: Effort normal.  GI:  Large truncal obesity.   Genitourinary:  Genitourinary Comments: Mild Rt CVAT at presnt. Perc drian in place with scant output.    Musculoskeletal: Normal range of motion.  Neurological: She is alert.  Skin: Skin is warm.  Psychiatric: She has a normal mood and affect.     Intake/Output from previous day: 03/06 0701 - 03/07 0700 In: 1780.4 [P.O.:360; I.V.:1060.4; IV Piggyback:350] Out: 1570 [Urine:1550; Drains:20] Intake/Output this shift: No intake/output data recorded.    Lab Results:  Recent Labs    11/13/17 0451 11/14/17 0310  WBC 32.3* 28.3*  HGB 10.7* 10.8*  HCT 31.7* 32.2*  PLT 253 253   BMET Recent Labs   11/13/17 0451 11/14/17 0310  NA 137 134*  K 4.6 4.7  CL 107 107  CO2 24 21*  GLUCOSE 424* 455*  BUN 24* 24*  CREATININE 1.35* 1.26*  CALCIUM 8.6* 8.1*   PT/INR Recent Labs    11/12/17 1347  LABPROT 15.8*  INR 1.27   ABG No results for input(s): PHART, HCO3 in the last 72 hours.  Invalid input(s): PCO2, PO2  Studies/Results: Dg Chest 2 View  Result Date: 11/12/2017 CLINICAL DATA:  Night sweats for 2 weeks. History of diabetes and hypertension. EXAM: CHEST - 2 VIEW COMPARISON:  Chest radiograph July 07, 2017 FINDINGS: Cardiac silhouette is mildly enlarged. Improved aeration lungs, however there is persistent bronchial wall thickening with bandlike density RIGHT lung base. Mildly elevated RIGHT hemidiaphragm. No pleural effusion. No pneumothorax. Soft tissue planes and included osseous structures are non suspicious. IMPRESSION: Bronchitic changes with RIGHT lung base atelectasis, less likely pneumonia. Stable cardiomegaly. Electronically Signed   By: Awilda Metro M.D.   On: 11/12/2017 17:12   Ct Abdomen Pelvis W Contrast  Result Date: 11/12/2017 CLINICAL DATA:  Right upper quadrant pain x2 weeks, melena, history of renal stones. EXAM: CT ABDOMEN AND PELVIS WITH CONTRAST TECHNIQUE: Multidetector CT imaging of the abdomen and pelvis was performed using the standard protocol following bolus administration of intravenous contrast. CONTRAST:  ISOVUE-300 IOPAMIDOL (ISOVUE-300) INJECTION 61% COMPARISON:  06/27/2017 FINDINGS: Lower chest: Lung bases are clear. Hepatobiliary: Nodular hepatic contour, suggesting cirrhosis.  No focal hepatic lesion is seen. Layering 6 mm gallstone (series 2/image 42), without associated inflammatory changes. No intrahepatic or extrahepatic ductal dilatation. Pancreas: Within normal limits. Spleen: Spleen is normal in size. Adrenals/Urinary Tract: 1.7 cm right adrenal nodule (series 2/image 26; sagittal image 82), indeterminate, although favored to be  present in 2013, suggesting a benign adrenal adenoma. Left adrenal gland is within normal limits. Multiple bilateral renal cysts, measuring up to 5.2 cm in the anterior interpolar right kidney (series 2/image 36). No hydronephrosis. Abnormal soft tissue/inflammation with heterogeneous enhancement of the right lower kidney (series 2/image 44). Secondary involvement of the right psoas muscle with associated centrally necrotic 3.8 x 5.7 x 6.6 cm lesion in the right psoas (series 2/image 47). Associated fluid/gas in the right retroperitoneum/posterior pararenal space, measuring up to 5.9 x 4.9 x 6.0 cm (series 2/image 54), although this does not reflect a well-defined fluid collection. Overall, given this constellation of findings, this appearance favors pyelonephritis with associated psoas abscess and gas-forming infection extending into the retroperitoneum/posterior pararenal space. Technically speaking, some of this abnormal soft tissue and the right psoas abnormality could reflect tumor, but this would not account for the associated soft tissue inflammation/gas. Bladder is within normal limits. Stomach/Bowel: Stomach is within normal limits. No evidence of bowel obstruction. Normal appendix (series 2/image 62). Notably, the ascending colon/cecum and appendix are remote from the fluid/gas in the posterior pararenal space. Vascular/Lymphatic: No evidence of abdominal aortic aneurysm. Atherosclerotic calcifications of the abdominal aorta and branch vessels. No suspicious abdominopelvic lymphadenopathy. Reproductive: Uterus is within normal limits. Bilateral ovaries are within normal limits. Other: No abdominopelvic ascites. Musculoskeletal: Right psoas lesion, as described above. Visualized osseous structures are within normal limits. IMPRESSION: Abnormal soft tissue/inflammation with heterogeneous enhancement of the right lower kidney, favoring pyelonephritis, with associated psoas abscess and gas-forming infection  extending into the right retroperitoneum/posterior pararenal space. Suspected psoas abscess currently measures up to 6.6 cm. Ill-defined fluid/gas in the right posterior pararenal space measures to 6.0 cm, but this does not reflect a well-defined fluid collection. Additional ancillary findings as above, including cirrhosis, cholelithiasis, and bilateral renal cysts. These results were called by telephone at the time of interpretation on 11/12/2017 at 11:39 am to [facility] , who verbally acknowledged these results. Electronically Signed   By: Charline Bills M.D.   On: 11/12/2017 11:41   Ct Image Guided Drainage By Percutaneous Catheter  Result Date: 11/13/2017 CLINICAL DATA:  Right psoas abscess EXAM: CT GUIDED DRAINAGE OF RIGHT PSOAS ABSCESS ANESTHESIA/SEDATION: Intravenous Fentanyl and Versed were administered as conscious sedation during continuous monitoring of the patient's level of consciousness and physiological / cardiorespiratory status by the radiology RN, with a total moderate sedation time of 14 minutes. PROCEDURE: The procedure, risks, benefits, and alternatives were explained to the patient. Questions regarding the procedure were encouraged and answered. The patient understands and consents to the procedure. Patient placed in left lateral decubitus position. Limited axial scans through the abdomen were obtained. The right psoas collection was localized and an appropriate skin entry site was determined and marked. The operative field was prepped with chlorhexidinein a sterile fashion, and a sterile drape was applied covering the operative field. A sterile gown and sterile gloves were used for the procedure. Local anesthesia was provided with 1% Lidocaine. Under CT fluoroscopic guidance, an 18 gauge trocar needle was advanced into the collection using a right paraspinal approach. Once needle tip was seen in the collection, aspiration VL purulent material. An Amplatz wire advanced easily, its  position confirmed on CT. Tract dilated to facilitate placement of a 14 French 12 French pigtail drain catheter, formed centrally within the collection. CT confirmed appropriate drain catheter position. 20 mL of blood-tinged purulent aspirate were sent for Gram stain and culture. The catheter was flushed and placed to external gravity drain bag. The catheter was secured externally with 0 Prolene suture and StatLock. The patient tolerated the procedure well. COMPLICATIONS: None immediate FINDINGS: The right psoas abscess was again localized. 12 French abscess drain catheter placed. Sample of the aspirate sent for Gram stain and culture. IMPRESSION: 1. Technically successful CT-guided right psoas abscess drain catheter placement Electronically Signed   By: Corlis Leak  Hassell M.D.   On: 11/13/2017 17:44    Anti-infectives: Anti-infectives (From admission, onward)   Start     Dose/Rate Route Frequency Ordered Stop   11/13/17 1400  vancomycin (VANCOCIN) IVPB 750 mg/150 ml premix     750 mg 150 mL/hr over 60 Minutes Intravenous Every 24 hours 11/12/17 1816     11/12/17 2000  ceFEPIme (MAXIPIME) 2 g in sodium chloride 0.9 % 100 mL IVPB     2 g 200 mL/hr over 30 Minutes Intravenous Every 12 hours 11/12/17 1816     11/12/17 1515  vancomycin (VANCOCIN) 2,500 mg in sodium chloride 0.9 % 500 mL IVPB     2,500 mg 250 mL/hr over 120 Minutes Intravenous  Once 11/12/17 1440 11/12/17 1741   11/12/17 1445  piperacillin-tazobactam (ZOSYN) IVPB 3.375 g     3.375 g 100 mL/hr over 30 Minutes Intravenous  Once 11/12/17 1438 11/12/17 1554      Assessment/Plan:  1 - Severe Right Kidney Infection / Psoas Abscess - amazingly this does not appear to be obstrutive urinary process as no stones, hydro, delayed nephrogram. This is more likely severe infectious GU manifestation of severe hyperglycemia exacerbated by recent steroid use. Agree with current ABX (maybe consider DC vanc) and continue perc drain for now until leukocytosis  more improved.  Again rec repeat CT prior to DC.   Remains non-surgical.  Please call me directly with questions anytime.    [facility] LL[facility]ANNY, Amilee Janvier 11/14/2017

## 2017-11-14 NOTE — Plan of Care (Signed)
Patient  stable since transferring to [facility] from ICU, medicated for pain in right flank x 3 with some improvement.  Patient vomited x 2, large amount of vomitus after breakfast and lunch, MD notified, abdominal film obtained.  Children and husband at bedside.

## 2017-11-14 NOTE — Progress Notes (Signed)
Patient Demographics:    Dana Thornton, is a 62 y.o. female, DOB - [birth year], BJY:[id number]  Admit date - 11/12/2017   Admitting Physician Esperanza Sheets, MD  Outpatient Primary MD for the patient is Maurice Small, MD  LOS - 2   Chief Complaint  Patient presents with  . Flank Pain        Subjective:    [name] today has no fevers, no emesis,  No chest pain,  Son and Daughter at bedside, no f/c   Assessment  & Plan :    Active Problems:   Diabetes type 2, uncontrolled (HCC)   Autoimmune hepatitis (HCC)   Uncontrolled diabetes mellitus type 2 without complications (HCC)   Sepsis (HCC)   Acute pyelonephritis   Psoas abscess (HCC)   Acute lower UTI   Assessment and Plan: Brief summary   62 y.o.femalewith PMH of HTN, DM, Nephrolithiasis, Autoimmune hepatitis (on prednisone), CKD, E coli-UTI and Sepsis (06/2017) presented with progressively worsening right flank pain, found to have right-sided pyelonephritis and psoas abscess status post IR drainage on 11/13/2017    Assessment/Plan:  1)Psoas abscess/Acute right pyelonephritis-  S/p Rt psoas abscess drain 11/13/17 afebrile; WBC 28.3(32.3), hgb stable, creat 1.26(1.354), drain fluid cx pend; cont with drain NS flushes, output monitoring; check final fluid cultures. Interventional Radiology and urology input appreciated,-cont iv antibiotics with vanc/cefepime started on 11/12/17, pend  abscess cultures, patient is chronically on prednisone, currently on Solu-Cortef stress dose steroids,  2)DM.Uncontrolled.-Worsening hyperglycemia due to stress of infection and steroids,  lantus 28U PM, 10U AM.  Give high-dose insulin sliding scale  3)HTN. - BP is soft. hold amlodipine, BB, losartan   4)Autoimmune hepatitis. Patient is on chronic intermittent prednisone, and mercaptopurine.  Will hold mercaptopurine , `continue IV  Solu-Cortef  5)Recurrent emesis/abdominal pain-check abdominal x-rays to rule out obstructive findings, Zofran as needed  Code Status: full Family Communication: d/w patient, SON AND daughter at bedside Disposition Plan: pend clinical improvement    Consultants:  Urology   IR  Procedures:  Pend IR procedure   Antibiotics:  Cefepime 3/5>>>  vanc 3/5>>>>  DVT Prophylaxis  :  Lovenox    Lab Results  Component Value Date   PLT 253 11/14/2017    Inpatient Medications  Scheduled Meds: . clotrimazole  1 Applicatorful Vaginal QHS  . enoxaparin (LOVENOX) injection  40 mg Subcutaneous Q24H  . ezetimibe  10 mg Oral Daily  . hydrocortisone sod succinate (SOLU-CORTEF) inj  25 mg Intravenous Q12H  . insulin aspart  0-20 Units Subcutaneous TID WC  . insulin aspart  0-5 Units Subcutaneous QHS  . insulin glargine  28 Units Subcutaneous QHS   And  . insulin glargine  10 Units Subcutaneous Daily  . levothyroxine  112 mcg Oral QAC breakfast  . sodium chloride flush  5 mL Intracatheter Q8H  . Vitamin D (Ergocalciferol)  50,000 Units Oral Q7 days   Continuous Infusions: . sodium chloride 125 mL/hr at 11/14/17 1000  . ceFEPime (MAXIPIME) IV Stopped (11/14/17 1035)  . vancomycin Stopped (11/14/17 1600)   PRN Meds:.acetaminophen, fentaNYL (SUBLIMAZE) injection, hydrALAZINE, lip balm, ondansetron (ZOFRAN) IV, oxyCODONE    Anti-infectives (From admission, onward)   Start     Dose/Rate Route Frequency Ordered Stop  11/13/17 1400  vancomycin (VANCOCIN) IVPB 750 mg/150 ml premix     750 mg 150 mL/hr over 60 Minutes Intravenous Every 24 hours 11/12/17 1816     11/12/17 2000  ceFEPIme (MAXIPIME) 2 g in sodium chloride 0.9 % 100 mL IVPB     2 g 200 mL/hr over 30 Minutes Intravenous Every 12 hours 11/12/17 1816     11/12/17 1515  vancomycin (VANCOCIN) 2,500 mg in sodium chloride 0.9 % 500 mL IVPB     2,500 mg 250 mL/hr over 120 Minutes Intravenous  Once 11/12/17 1440 11/12/17  1741   11/12/17 1445  piperacillin-tazobactam (ZOSYN) IVPB 3.375 g     3.375 g 100 mL/hr over 30 Minutes Intravenous  Once 11/12/17 1438 11/12/17 1554        Objective:   Vitals:   11/14/17 0700 11/14/17 0800 11/14/17 1000 11/14/17 1244  BP: (!) 181/82  (!) 157/94 (!) 177/92  Pulse: 69  77 78  Resp:    20  Temp:  (!) 97.3 F (36.3 C)  97.8 F (36.6 C)  TempSrc:  Oral  Oral  SpO2: 97%  95% 97%  Weight:      Height:        Wt Readings from Last 3 Encounters:  11/13/17 96.7 kg (213 lb 3 oz)  07/03/17 114.7 kg (252 lb 14.4 oz)  04/13/15 109.9 kg (242 lb 3.2 oz)     Intake/Output Summary (Last 24 hours) at 11/14/2017 1705 Last data filed at 11/14/2017 1000 Gross per 24 hour  Intake 1470 ml  Output 1320 ml  Net 150 ml   Physical Exam  Gen:- Awake Alert,  In no apparent distress  HEENT:- [place].AT, No sclera icterus Neck-Supple Neck,No JVD,.  Lungs-  CTAB  CV- S1, S2 normal Abd-  +ve B.Sounds, Abd Soft, abdominal discomfort without rebound or guarding right flank with drainage tube in scant amount of drainage in the bag    Extremity/Skin:- No  edema,    Psych-affect is appropriate Neuro-no new focal deficits   Data Review:   Micro Results Recent Results (from the past 240 hour(s))  Blood culture (routine x 2)     Status: None (Preliminary result)   Collection Time: 11/12/17  2:22 PM  Result Value Ref Range Status   Specimen Description   Final    BLOOD LEFT ANTECUBITAL Performed at [facility], [address], [place], [place] [address]    Special Requests   Final    BOTTLES DRAWN AEROBIC AND ANAEROBIC Blood Culture adequate volume Performed at [facility], [address] [place], [place] [address]    Culture   Final    NO GROWTH 2 DAYS Performed at [facility], [address], [place], [place] [address]    Report Status PENDING  Incomplete  Blood culture (routine x 2)     Status: Abnormal (Preliminary result)    Collection Time: 11/12/17  2:49 PM  Result Value Ref Range Status   Specimen Description   Final    BLOOD RIGHT ANTECUBITAL Performed at [facility], [address] [place], [place] [address]    Special Requests   Final    BOTTLES DRAWN AEROBIC AND ANAEROBIC Blood Culture results may not be optimal due to an inadequate volume of blood received in culture bottles Performed at [facility], [address], [place], [place] [address]    Culture  Setup Time   Final  GRAM POSITIVE COCCI AEROBIC BOTTLE ONLY Organism ID to follow    Culture (A)  Final    STAPHYLOCOCCUS SPECIES (COAGULASE NEGATIVE) THE SIGNIFICANCE OF ISOLATING THIS ORGANISM FROM A SINGLE SET OF BLOOD CULTURES WHEN MULTIPLE SETS ARE DRAWN IS UNCERTAIN. PLEASE NOTIFY THE MICROBIOLOGY DEPARTMENT WITHIN ONE WEEK IF SPECIATION AND SENSITIVITIES ARE REQUIRED. Performed at [facility], [address], [place], [place] [address]    Report Status PENDING  Incomplete  Blood Culture ID Panel (Reflexed)     Status: Abnormal   Collection Time: 11/12/17  2:49 PM  Result Value Ref Range Status   Enterococcus species NOT DETECTED NOT DETECTED Final   Listeria monocytogenes NOT DETECTED NOT DETECTED Final   Staphylococcus species DETECTED (A) NOT DETECTED Final    Comment: Methicillin (oxacillin) susceptible coagulase negative staphylococcus. Possible blood culture contaminant (unless isolated from more than one blood culture draw or clinical case suggests pathogenicity). No antibiotic treatment is indicated for blood  culture contaminants. CRITICAL RESULT CALLED TO, READ BACK BY AND VERIFIED WITH: Peggyann Juba PharmD 16:15 11/13/17 ([email or web address])    Staphylococcus aureus NOT DETECTED NOT DETECTED Final   Methicillin resistance NOT DETECTED NOT DETECTED Final   Streptococcus species NOT DETECTED NOT DETECTED Final   Streptococcus agalactiae NOT DETECTED NOT DETECTED Final   Streptococcus  pneumoniae NOT DETECTED NOT DETECTED Final   Streptococcus pyogenes NOT DETECTED NOT DETECTED Final   Acinetobacter baumannii NOT DETECTED NOT DETECTED Final   Enterobacteriaceae species NOT DETECTED NOT DETECTED Final   Enterobacter cloacae complex NOT DETECTED NOT DETECTED Final   Escherichia coli NOT DETECTED NOT DETECTED Final   Klebsiella oxytoca NOT DETECTED NOT DETECTED Final   Klebsiella pneumoniae NOT DETECTED NOT DETECTED Final   Proteus species NOT DETECTED NOT DETECTED Final   Serratia marcescens NOT DETECTED NOT DETECTED Final   Haemophilus influenzae NOT DETECTED NOT DETECTED Final   Neisseria meningitidis NOT DETECTED NOT DETECTED Final   Pseudomonas aeruginosa NOT DETECTED NOT DETECTED Final   Candida albicans NOT DETECTED NOT DETECTED Final   Candida glabrata NOT DETECTED NOT DETECTED Final   Candida krusei NOT DETECTED NOT DETECTED Final   Candida parapsilosis NOT DETECTED NOT DETECTED Final   Candida tropicalis NOT DETECTED NOT DETECTED Final  Urine culture     Status: Abnormal   Collection Time: 11/12/17  4:10 PM  Result Value Ref Range Status   Specimen Description   Final    URINE, CLEAN CATCH Performed at [facility], [address] [place], [place] [address]    Special Requests   Final    NONE Performed at [facility], [address] [place], [place] [address]    Culture >=100,000 COLONIES/mL ESCHERICHIA COLI (A)  Final   Report Status 11/14/2017 FINAL  Final   Organism ID, Bacteria ESCHERICHIA COLI (A)  Final      Susceptibility   Escherichia coli - MIC*    AMPICILLIN <=2 SENSITIVE Sensitive     CEFAZOLIN <=4 SENSITIVE Sensitive     CEFTRIAXONE <=1 SENSITIVE Sensitive     CIPROFLOXACIN <=0.25 SENSITIVE Sensitive     GENTAMICIN <=1 SENSITIVE Sensitive     IMIPENEM <=0.25 SENSITIVE Sensitive     NITROFURANTOIN <=16 SENSITIVE Sensitive     TRIMETH/SULFA <=20 SENSITIVE Sensitive     AMPICILLIN/SULBACTAM <=2  SENSITIVE Sensitive     PIP/TAZO <=4 SENSITIVE Sensitive     Extended ESBL NEGATIVE Sensitive     * >=100,000 COLONIES/mL ESCHERICHIA COLI  MRSA PCR Screening     Status: None   Collection Time: 11/13/17  3:50 AM  Result Value Ref Range Status   MRSA by PCR NEGATIVE NEGATIVE Final    Comment:        The GeneXpert MRSA Assay (FDA approved for NASAL specimens only), is one component of a comprehensive MRSA colonization surveillance program. It is not intended to diagnose MRSA infection nor to guide or monitor treatment for MRSA infections. Performed at [facility], [address] [place], [place] [address]   Aerobic/Anaerobic Culture (surgical/deep wound)     Status: None (Preliminary result)   Collection Time: 11/13/17  4:53 PM  Result Value Ref Range Status   Specimen Description   Final    ABSCESS BACK Performed at [facility], [address] [place], [place] [address]    Special Requests   Final    NONE Performed at [facility], [address] [place], [place] [address]    Gram Stain   Final    ABUNDANT WBC PRESENT, PREDOMINANTLY PMN MODERATE GRAM NEGATIVE RODS    Culture   Final    CULTURE REINCUBATED FOR BETTER GROWTH Performed at [facility], [address] [place], [place] [address]    Report Status PENDING  Incomplete    Radiology Reports Dg Chest 2 View  Result Date: 11/12/2017 CLINICAL DATA:  Night sweats for 2 weeks. History of diabetes and hypertension. EXAM: CHEST - 2 VIEW COMPARISON:  Chest radiograph July 07, 2017 FINDINGS: Cardiac silhouette is mildly enlarged. Improved aeration lungs, however there is persistent bronchial wall thickening with bandlike density RIGHT lung base. Mildly elevated RIGHT hemidiaphragm. No pleural effusion. No pneumothorax. Soft tissue planes and included osseous structures are non suspicious. IMPRESSION: Bronchitic changes with RIGHT lung base atelectasis,  less likely pneumonia. Stable cardiomegaly. Electronically Signed   By: Awilda Metro M.D.   On: 11/12/2017 17:12   Ct Abdomen Pelvis W Contrast  Result Date: 11/12/2017 CLINICAL DATA:  Right upper quadrant pain x2 weeks, melena, history of renal stones. EXAM: CT ABDOMEN AND PELVIS WITH CONTRAST TECHNIQUE: Multidetector CT imaging of the abdomen and pelvis was performed using the standard protocol following bolus administration of intravenous contrast. CONTRAST:  ISOVUE-300 IOPAMIDOL (ISOVUE-300) INJECTION 61% COMPARISON:  06/27/2017 FINDINGS: Lower chest: Lung bases are clear. Hepatobiliary: Nodular hepatic contour, suggesting cirrhosis. No focal hepatic lesion is seen. Layering 6 mm gallstone (series 2/image 42), without associated inflammatory changes. No intrahepatic or extrahepatic ductal dilatation. Pancreas: Within normal limits. Spleen: Spleen is normal in size. Adrenals/Urinary Tract: 1.7 cm right adrenal nodule (series 2/image 26; sagittal image 82), indeterminate, although favored to be present in 2013, suggesting a benign adrenal adenoma. Left adrenal gland is within normal limits. Multiple bilateral renal cysts, measuring up to 5.2 cm in the anterior interpolar right kidney (series 2/image 36). No hydronephrosis. Abnormal soft tissue/inflammation with heterogeneous enhancement of the right lower kidney (series 2/image 44). Secondary involvement of the right psoas muscle with associated centrally necrotic 3.8 x 5.7 x 6.6 cm lesion in the right psoas (series 2/image 47). Associated fluid/gas in the right retroperitoneum/posterior pararenal space, measuring up to 5.9 x 4.9 x 6.0 cm (series 2/image 54), although this does not reflect a well-defined fluid collection. Overall, given this constellation of findings, this appearance favors pyelonephritis with associated psoas abscess and gas-forming infection extending into the retroperitoneum/posterior pararenal space. Technically speaking, some of  this abnormal soft tissue and the right psoas  abnormality could reflect tumor, but this would not account for the associated soft tissue inflammation/gas. Bladder is within normal limits. Stomach/Bowel: Stomach is within normal limits. No evidence of bowel obstruction. Normal appendix (series 2/image 62). Notably, the ascending colon/cecum and appendix are remote from the fluid/gas in the posterior pararenal space. Vascular/Lymphatic: No evidence of abdominal aortic aneurysm. Atherosclerotic calcifications of the abdominal aorta and branch vessels. No suspicious abdominopelvic lymphadenopathy. Reproductive: Uterus is within normal limits. Bilateral ovaries are within normal limits. Other: No abdominopelvic ascites. Musculoskeletal: Right psoas lesion, as described above. Visualized osseous structures are within normal limits. IMPRESSION: Abnormal soft tissue/inflammation with heterogeneous enhancement of the right lower kidney, favoring pyelonephritis, with associated psoas abscess and gas-forming infection extending into the right retroperitoneum/posterior pararenal space. Suspected psoas abscess currently measures up to 6.6 cm. Ill-defined fluid/gas in the right posterior pararenal space measures to 6.0 cm, but this does not reflect a well-defined fluid collection. Additional ancillary findings as above, including cirrhosis, cholelithiasis, and bilateral renal cysts. These results were called by telephone at the time of interpretation on 11/12/2017 at 11:39 am to [facility] ClinicNOELLE REDMON , who verbally acknowledged these results. Electronically Signed   By: Charline BillsSriyesh  Krishnan M.D.   On: 11/12/2017 11:41   Ct Image Guided Drainage By Percutaneous Catheter  Result Date: 11/13/2017 CLINICAL DATA:  Right psoas abscess EXAM: CT GUIDED DRAINAGE OF RIGHT PSOAS ABSCESS ANESTHESIA/SEDATION: Intravenous Fentanyl and Versed were administered as conscious sedation during continuous monitoring of the patient's level of consciousness and  physiological / cardiorespiratory status by the radiology RN, with a total moderate sedation time of 14 minutes. PROCEDURE: The procedure, risks, benefits, and alternatives were explained to the patient. Questions regarding the procedure were encouraged and answered. The patient understands and consents to the procedure. Patient placed in left lateral decubitus position. Limited axial scans through the abdomen were obtained. The right psoas collection was localized and an appropriate skin entry site was determined and marked. The operative field was prepped with chlorhexidinein a sterile fashion, and a sterile drape was applied covering the operative field. A sterile gown and sterile gloves were used for the procedure. Local anesthesia was provided with 1% Lidocaine. Under CT fluoroscopic guidance, an 18 gauge trocar needle was advanced into the collection using a right paraspinal approach. Once needle tip was seen in the collection, aspiration VL purulent material. An Amplatz wire advanced easily, its position confirmed on CT. Tract dilated to facilitate placement of a 14 French 12 French pigtail drain catheter, formed centrally within the collection. CT confirmed appropriate drain catheter position. 20 mL of blood-tinged purulent aspirate were sent for Gram stain and culture. The catheter was flushed and placed to external gravity drain bag. The catheter was secured externally with 0 Prolene suture and StatLock. The patient tolerated the procedure well. COMPLICATIONS: None immediate FINDINGS: The right psoas abscess was again localized. 12 French abscess drain catheter placed. Sample of the aspirate sent for Gram stain and culture. IMPRESSION: 1. Technically successful CT-guided right psoas abscess drain catheter placement Electronically Signed   By: Corlis Leak  Hassell M.D.   On: 11/13/2017 17:44     CBC Recent Labs  Lab 11/12/17 1252 11/13/17 0336 11/13/17 0451 11/14/17 0310  WBC 34.1* 35.3* 32.3* 28.3*  HGB  12.7 11.6* 10.7* 10.8*  HCT 37.0 34.0* 31.7* 32.2*  PLT 297 251 253 253  MCV 86.7 87.9 87.3 88.2  MCH 29.7 30.0 29.5 29.6  MCHC 34.3 34.1 33.8 33.5  RDW 14.5 14.8 14.7 14.7  Chemistries  Recent Labs  Lab 11/12/17 1347 11/13/17 0336 11/13/17 0451 11/14/17 0310  NA 132* 133* 137 134*  K 5.7* 4.6 4.6 4.7  CL 98* 104 107 107  CO2 25 22 24  21*  GLUCOSE 520* 428* 424* 455*  BUN 23* 25* 24* 24*  CREATININE 1.39* 1.31* 1.35* 1.26*  CALCIUM 9.1 8.3* 8.6* 8.1*  AST 41 25  --   --   ALT 33 27  --   --   ALKPHOS 191* 159*  --   --   BILITOT 1.5* 0.9  --   --    ------------------------------------------------------------------------------------------------------------------ No results for input(s): CHOL, HDL, LDLCALC, TRIG, CHOLHDL, LDLDIRECT in the last 72 hours.  Lab Results  Component Value Date   HGBA1C 12.0 (H) 11/12/2017   ------------------------------------------------------------------------------------------------------------------ Recent Labs    11/12/17 1701  TSH 7.408*   ------------------------------------------------------------------------------------------------------------------ No results for input(s): VITAMINB12, FOLATE, FERRITIN, TIBC, IRON, RETICCTPCT in the last 72 hours.  Coagulation profile Recent Labs  Lab 11/12/17 1347  INR 1.27    No results for input(s): DDIMER in the last 72 hours.  Cardiac Enzymes No results for input(s): CKMB, TROPONINI, MYOGLOBIN in the last 168 hours.  Invalid input(s): CK ------------------------------------------------------------------------------------------------------------------    Component Value Date/Time   BNP 82.4 06/27/2017 1003     Minervia Osso M.D on 11/14/2017 at 5:05 PM  Between 7am to 7pm - Pager - [phone]  After 7pm go to [email or web address] - password [email or web address]  [facility] -  Office  [phone]   Voice Recognition Reubin Milan dictation system was used to create this note, attempts  have been made to correct errors. Please contact the author with questions and/or clarifications.

## 2017-11-14 NOTE — Progress Notes (Signed)
Referring Physician(s): Manny,T  Supervising Physician: Oley BalmHassell, Daniel  Patient [facility]:  [facility]  Chief Complaint:  Right psoas abscess  Subjective: Pt feeling a little better since rt psoas drain placed but still with some rt flank/RLQ discomfort; has had some vomiting   Allergies: Codeine  Medications: Prior to Admission medications   Medication Sig Start Date End Date Taking? Authorizing Provider  albuterol (PROVENTIL HFA;VENTOLIN HFA) 108 (90 Base) MCG/ACT inhaler Inhale 2 puffs into the lungs every 6 (six) hours as needed for wheezing or shortness of breath. 07/03/17  Yes Rhetta MuraSamtani, Jai-Gurmukh, MD  amLODipine (NORVASC) 10 MG tablet Take 10 mg by mouth daily.   Yes [provider]  carvedilol (COREG) 6.25 MG tablet Take 6.25 mg by mouth 2 (two) times daily with a meal.   Yes [provider]  ezetimibe (ZETIA) 10 MG tablet Take 10 mg by mouth daily.   Yes [provider]  insulin aspart (NOVOLOG) 100 UNIT/ML injection Before each meal 3 times a day, 120-150 - 2 units, 151-200 - 4 units, 201-250 - 8 units, 251-300 - 10 units,  301 or above 10 units and call your MD Patient taking differently: Inject 10 Units into the skin 2 (two) times daily.  12/10/11  Yes Leroy SeaSingh, Prashant K, MD  Insulin Detemir (LEVEMIR FLEXPEN [place]) Inject 28 Units into the skin at bedtime.   Yes [provider]  insulin glargine (LANTUS) 100 UNIT/ML injection Take 30 Units in the morning and 10 units at Night. Patient taking differently: Inject 10 Units into the skin 2 (two) times daily.  04/13/15  Yes Osvaldo ShipperKrishnan, Gokul, MD  levothyroxine (SYNTHROID, LEVOTHROID) 150 MCG tablet Take 1 tablet (150 mcg total) by mouth daily before breakfast. Patient taking differently: Take 112 mcg by mouth daily before breakfast.  04/13/15  Yes Osvaldo ShipperKrishnan, Gokul, MD  losartan (COZAAR) 100 MG tablet Take 100 mg by mouth daily.   Yes [provider]  mercaptopurine (PURINETHOL) 50 MG tablet  Take 150 mg by mouth daily. Give on an empty stomach 1 hour before or 2 hours after meals. Caution: Chemotherapy.   Yes [provider]  naproxen sodium (ALEVE) 220 MG tablet Take 220 mg by mouth daily as needed (PAIN).   Yes [provider]  predniSONE (DELTASONE) 20 MG tablet Take 2 tablets (40 mg total) by mouth daily with breakfast. Patient taking differently: Take 10 mg by mouth daily with breakfast.  04/13/15  Yes Osvaldo ShipperKrishnan, Gokul, MD  terconazole (TERAZOL 7) 0.4 % vaginal cream Place 1 applicator vaginally daily. 10/21/17  Yes [provider]  Vitamin D, Ergocalciferol, (DRISDOL) 50000 units CAPS capsule Take 50,000 Units by mouth every 7 (seven) days.   Yes [provider]  cefUROXime (CEFTIN) 500 MG tablet Take 1 tablet (500 mg total) by mouth daily with breakfast. Patient not taking: Reported on 11/12/2017 07/03/17   Rhetta MuraSamtani, Jai-Gurmukh, MD  Spacer/Aero-Holding Chambers (BREATHERITE COLL SPACER ADULT) MISC 1 Device by Does not apply route every 6 (six) hours as needed. 07/03/17   Rhetta MuraSamtani, Jai-Gurmukh, MD     Vital Signs: BP (!) 177/92 (BP Location: Right Arm)   Pulse 78   Temp 97.8 F (36.6 C) (Oral)   Resp 20   Ht 5\' 2"  (1.575 m)   Wt 213 lb 3 oz (96.7 kg)   SpO2 97%   BMI 38.99 kg/m   Physical Exam rt flank drain intact, dressing dry, output 20 cc purulent light brown- blood tinged fluid; drain irrigated  without difficulty  Imaging: Dg Chest 2 View  Result Date: 11/12/2017 CLINICAL DATA:  Night sweats for 2 weeks. History of diabetes and hypertension. EXAM: CHEST - 2 VIEW COMPARISON:  Chest radiograph July 07, 2017 FINDINGS: Cardiac silhouette is mildly enlarged. Improved aeration lungs, however there is persistent bronchial wall thickening with bandlike density RIGHT lung base. Mildly elevated RIGHT hemidiaphragm. No pleural effusion. No pneumothorax. Soft tissue planes and included osseous structures are non suspicious. IMPRESSION:  Bronchitic changes with RIGHT lung base atelectasis, less likely pneumonia. Stable cardiomegaly. Electronically Signed   By: Awilda Metro M.D.   On: 11/12/2017 17:12   Ct Abdomen Pelvis W Contrast  Result Date: 11/12/2017 CLINICAL DATA:  Right upper quadrant pain x2 weeks, melena, history of renal stones. EXAM: CT ABDOMEN AND PELVIS WITH CONTRAST TECHNIQUE: Multidetector CT imaging of the abdomen and pelvis was performed using the standard protocol following bolus administration of intravenous contrast. CONTRAST:  ISOVUE-300 IOPAMIDOL (ISOVUE-300) INJECTION 61% COMPARISON:  06/27/2017 FINDINGS: Lower chest: Lung bases are clear. Hepatobiliary: Nodular hepatic contour, suggesting cirrhosis. No focal hepatic lesion is seen. Layering 6 mm gallstone (series 2/image 42), without associated inflammatory changes. No intrahepatic or extrahepatic ductal dilatation. Pancreas: Within normal limits. Spleen: Spleen is normal in size. Adrenals/Urinary Tract: 1.7 cm right adrenal nodule (series 2/image 26; sagittal image 82), indeterminate, although favored to be present in 2013, suggesting a benign adrenal adenoma. Left adrenal gland is within normal limits. Multiple bilateral renal cysts, measuring up to 5.2 cm in the anterior interpolar right kidney (series 2/image 36). No hydronephrosis. Abnormal soft tissue/inflammation with heterogeneous enhancement of the right lower kidney (series 2/image 44). Secondary involvement of the right psoas muscle with associated centrally necrotic 3.8 x 5.7 x 6.6 cm lesion in the right psoas (series 2/image 47). Associated fluid/gas in the right retroperitoneum/posterior pararenal space, measuring up to 5.9 x 4.9 x 6.0 cm (series 2/image 54), although this does not reflect a well-defined fluid collection. Overall, given this constellation of findings, this appearance favors pyelonephritis with associated psoas abscess and gas-forming infection extending into the  retroperitoneum/posterior pararenal space. Technically speaking, some of this abnormal soft tissue and the right psoas abnormality could reflect tumor, but this would not account for the associated soft tissue inflammation/gas. Bladder is within normal limits. Stomach/Bowel: Stomach is within normal limits. No evidence of bowel obstruction. Normal appendix (series 2/image 62). Notably, the ascending colon/cecum and appendix are remote from the fluid/gas in the posterior pararenal space. Vascular/Lymphatic: No evidence of abdominal aortic aneurysm. Atherosclerotic calcifications of the abdominal aorta and branch vessels. No suspicious abdominopelvic lymphadenopathy. Reproductive: Uterus is within normal limits. Bilateral ovaries are within normal limits. Other: No abdominopelvic ascites. Musculoskeletal: Right psoas lesion, as described above. Visualized osseous structures are within normal limits. IMPRESSION: Abnormal soft tissue/inflammation with heterogeneous enhancement of the right lower kidney, favoring pyelonephritis, with associated psoas abscess and gas-forming infection extending into the right retroperitoneum/posterior pararenal space. Suspected psoas abscess currently measures up to 6.6 cm. Ill-defined fluid/gas in the right posterior pararenal space measures to 6.0 cm, but this does not reflect a well-defined fluid collection. Additional ancillary findings as above, including cirrhosis, cholelithiasis, and bilateral renal cysts. These results were called by telephone at the time of interpretation on 11/12/2017 at 11:39 am to [facility] , who verbally acknowledged these results. Electronically Signed   By: Charline Bills M.D.   On: 11/12/2017 11:41   Ct Image Guided Drainage By Percutaneous Catheter  Result Date: 11/13/2017 CLINICAL DATA:  Right psoas abscess EXAM: CT GUIDED DRAINAGE OF RIGHT PSOAS ABSCESS ANESTHESIA/SEDATION: Intravenous Fentanyl and Versed were administered as conscious sedation  during continuous monitoring of the patient's level of consciousness and physiological / cardiorespiratory status by the radiology RN, with a total moderate sedation time of 14 minutes. PROCEDURE: The procedure, risks, benefits, and alternatives were explained to the patient. Questions regarding the procedure were encouraged and answered. The patient understands and consents to the procedure. Patient placed in left lateral decubitus position. Limited axial scans through the abdomen were obtained. The right psoas collection was localized and an appropriate skin entry site was determined and marked. The operative field was prepped with chlorhexidinein a sterile fashion, and a sterile drape was applied covering the operative field. A sterile gown and sterile gloves were used for the procedure. Local anesthesia was provided with 1% Lidocaine. Under CT fluoroscopic guidance, an 18 gauge trocar needle was advanced into the collection using a right paraspinal approach. Once needle tip was seen in the collection, aspiration VL purulent material. An Amplatz wire advanced easily, its position confirmed on CT. Tract dilated to facilitate placement of a 14 French 12 French pigtail drain catheter, formed centrally within the collection. CT confirmed appropriate drain catheter position. 20 mL of blood-tinged purulent aspirate were sent for Gram stain and culture. The catheter was flushed and placed to external gravity drain bag. The catheter was secured externally with 0 Prolene suture and StatLock. The patient tolerated the procedure well. COMPLICATIONS: None immediate FINDINGS: The right psoas abscess was again localized. 12 French abscess drain catheter placed. Sample of the aspirate sent for Gram stain and culture. IMPRESSION: 1. Technically successful CT-guided right psoas abscess drain catheter placement Electronically Signed   By: Corlis Leak M.D.   On: 11/13/2017 17:44    Labs:  CBC: Recent Labs    11/12/17 1252  11/13/17 0336 11/13/17 0451 11/14/17 0310  WBC 34.1* 35.3* 32.3* 28.3*  HGB 12.7 11.6* 10.7* 10.8*  HCT 37.0 34.0* 31.7* 32.2*  PLT 297 251 253 253    COAGS: Recent Labs    06/26/17 2156 11/12/17 1347  INR 1.67 1.27  APTT 39*  --     BMP: Recent Labs    11/12/17 1347 11/13/17 0336 11/13/17 0451 11/14/17 0310  NA 132* 133* 137 134*  K 5.7* 4.6 4.6 4.7  CL 98* 104 107 107  CO2 25 22 24  21*  GLUCOSE 520* 428* 424* 455*  BUN 23* 25* 24* 24*  CALCIUM 9.1 8.3* 8.6* 8.1*  CREATININE 1.39* 1.31* 1.35* 1.26*  GFRNONAA 40* 43* 41* 45*  GFRAA 46* 50* 48* 52*    LIVER FUNCTION TESTS: Recent Labs    06/30/17 1234 07/01/17 0353 07/03/17 0323 11/12/17 1347 11/13/17 0336  BILITOT 3.9* 2.3* 1.7* 1.5* 0.9  AST 104*  --  63* 41 25  ALT 49  --  41 33 27  ALKPHOS 260*  --  313* 191* 159*  PROT 6.8  --  7.7 8.8* 7.8  ALBUMIN 1.2*  --  1.5* 2.3* 2.0*    Assessment and Plan: S/p rt psoas abscess drain 3/6; afebrile; WBC 28.3(32.3), hgb stable, creat 1.26(1.354), drain fluid cx pend; cont with drain NS flushes, output monitoring; check final fluid cultures; antbx per primary team;  Will need f/u CT within 1 week of drain placement  Electronically Signed: D. Jeananne Rama, PA-C 11/14/2017, 2:47 PM   I spent a total of 15 minutes at the the patient's bedside AND on the patient's hospital  floor or unit, greater than 50% of which was counseling/coordinating care for right psoas abscess drain    Patient ID: Dana Thornton, female   DOB: Nov 10, 1955, 62 y.o.   MRN: [id number]

## 2017-11-15 DIAGNOSIS — A419 Sepsis, unspecified organism: Secondary | ICD-10-CM

## 2017-11-15 LAB — CBC
HEMATOCRIT: 31.8 % — AB (ref 36.0–46.0)
HEMOGLOBIN: 10.7 g/dL — AB (ref 12.0–15.0)
MCH: 29.6 pg (ref 26.0–34.0)
MCHC: 33.6 g/dL (ref 30.0–36.0)
MCV: 88.1 fL (ref 78.0–100.0)
Platelets: 254 10*3/uL (ref 150–400)
RBC: 3.61 MIL/uL — ABNORMAL LOW (ref 3.87–5.11)
RDW: 14.7 % (ref 11.5–15.5)
WBC: 23.7 10*3/uL — ABNORMAL HIGH (ref 4.0–10.5)

## 2017-11-15 LAB — GLUCOSE, CAPILLARY
GLUCOSE-CAPILLARY: 310 mg/dL — AB (ref 65–99)
Glucose-Capillary: 300 mg/dL — ABNORMAL HIGH (ref 65–99)
Glucose-Capillary: 356 mg/dL — ABNORMAL HIGH (ref 65–99)
Glucose-Capillary: 381 mg/dL — ABNORMAL HIGH (ref 65–99)

## 2017-11-15 LAB — BASIC METABOLIC PANEL
Anion gap: 5 (ref 5–15)
BUN: 22 mg/dL — AB (ref 6–20)
CHLORIDE: 109 mmol/L (ref 101–111)
CO2: 23 mmol/L (ref 22–32)
CREATININE: 1.06 mg/dL — AB (ref 0.44–1.00)
Calcium: 8.6 mg/dL — ABNORMAL LOW (ref 8.9–10.3)
GFR calc non Af Amer: 55 mL/min — ABNORMAL LOW (ref >60)
Glucose, Bld: 302 mg/dL — ABNORMAL HIGH (ref 65–99)
Potassium: 4.5 mmol/L (ref 3.5–5.1)
Sodium: 137 mmol/L (ref 135–145)

## 2017-11-15 LAB — CULTURE, BLOOD (ROUTINE X 2)

## 2017-11-15 NOTE — Progress Notes (Signed)
Inpatient Diabetes Program Recommendations  AACE/ADA: New Consensus Statement on Inpatient Glycemic Control (2015)  Target Ranges:  Prepandial:   less than 140 mg/dL      Peak postprandial:   less than 180 mg/dL (1-2 hours)      Critically ill patients:  140 - 180 mg/dL   Lab Results  Component Value Date   GLUCAP 356 (H) 11/15/2017   HGBA1C 12.0 (H) 11/12/2017    Review of Glycemic Control  Blood sugars in 300-400s x 2 days. On Solucortef 25 mg Q12H   Inpatient Diabetes Program Recommendations:     Add Novolog 8 units tidwc Increase Lantus to 15 units QAM and 30 units QHS  Continue to follow.  Thank you. Dana Thornton, RD, LDN, CDE Inpatient Diabetes Coordinator [phone]

## 2017-11-15 NOTE — Progress Notes (Signed)
Pharmacy Antibiotic Note  Dana LamerGloria W Thornton is a 62 y.o. female admitted on 11/12/2017 with pyelonephritis with associated psoas abscess.  Pharmacy has been consulted for vancomycin and cefepime dosing.  3/5 CT ZOX:[id number]:pyelonephritis with associated psoas abscess and gas-forming infection extending into the retroperitoneum/posterior pararenal space.   11/15/2017 WBC elevated but pt on solu-cortef Afebrile Scr 1.06, CrCl ~ 60.624mls/min  Plan:  Continue Cefepime 2 gm IV q12 Continue  vancomycin 750 mg IV q24 for est AUC 409.3 using SCr 1.06  F/u renal fxn, wbc, temp, culture data  Height: 5\' 2"  (157.5 cm) Weight: 213 lb 3 oz (96.7 kg) IBW/kg (Calculated) : 50.1  Temp (24hrs), Avg:98 F (36.7 C), Min:97.7 F (36.5 C), Max:98.4 F (36.9 C)  Recent Labs  Lab 11/12/17 1252 11/12/17 1347  11/12/17 1616 11/12/17 1945 11/12/17 2208 11/12/17 2214 11/13/17 0336 11/13/17 0451 11/14/17 0310 11/15/17 0435  WBC 34.1*  --   --   --   --   --   --  35.3* 32.3* 28.3* 23.7*  CREATININE  --  1.39*  --   --   --   --   --  1.31* 1.35* 1.26* 1.06*  LATICACIDVEN  --   --    < > 2.44* 3.03* 1.9 2.01* 1.6  --   --   --    < > = values in this interval not displayed.    Estimated Creatinine Clearance: 60.4 mL/min (A) (by C-G formula based on SCr of 1.06 mg/dL (H)).    Allergies  Allergen Reactions  . Codeine Nausea And Vomiting   Antimicrobials this admission:  3/5 Zosyn x1 3/5 Vanc>> 3/5 cefepime>> Dose adjustments this admission:   Microbiology results:  3/5 Ucx: >100K ecoli (pan sens) FINAL 3/5 BCx2: 1/2 GPC (BCID= staph species) 3/5 flu pcr: neg 3/6 MRSA PCR: neg 3/6 back abscess: moderate GNR Thank you for allowing pharmacy to be a part of this patient's care.  Arley PhenixEllen Tharon Bomar RPh 11/15/2017, 11:12 AM Pager [phone]

## 2017-11-15 NOTE — Progress Notes (Signed)
Patient Demographics:    Dana Thornton, is a 62 y.o. female, DOB - [birth year], ZOX:[id number]  Admit date - 11/12/2017   Admitting Physician Esperanza Sheets, MD  Outpatient Primary MD for the patient is Maurice Small, MD  LOS - 3   Chief Complaint  Patient presents with  . Flank Pain        Subjective:    Dana Thornton today has no fevers, no emesis,  No chest pain,  Son and Daughter at bedside, no f/c , Rt flank pain persist  Assessment  & Plan :    Active Problems:   Diabetes type 2, uncontrolled (HCC)   Autoimmune hepatitis (HCC)   Uncontrolled diabetes mellitus type 2 without complications (HCC)   Sepsis (HCC)   Acute pyelonephritis   Psoas abscess (HCC)   Acute lower UTI   Assessment and Plan: Brief summary   62 y.o.femalewith PMH of HTN, DM, Nephrolithiasis, Autoimmune hepatitis (on prednisone), CKD, E coli-UTI and Sepsis (06/2017) presented with progressively worsening right flank pain, found to have right-sided pyelonephritis and psoas abscess status post IR drainage on 11/13/2017   Assessment/Plan:  1)E coli Psoas abscess/Acute right pyelonephritis-  S/p Rt psoas abscess drain 11/13/17 afebrile;  drain fluid cx from 11/13/17 and 11/15/17 without growth, urine and blood cultures with pansensitive E. coli .  Interventional Radiology and urology input appreciated,-cont iv antibiotics with vanc/cefepime started on 11/12/17, persistent leukocytosis due to steroids  2)DM.Uncontrolled.-Worsening hyperglycemia due to stress of infection and steroids,  lantus 28U PM, 10U AM.  use high-dose insulin sliding scale  3)HTN. -  . hold amlodipine, BB, losartan until improves  4)Autoimmune hepatitis. Patient is on chronic intermittent prednisone, and mercaptopurine.  Will hold mercaptopurine , `continue IV Solu-Cortef 25 mg q 12h 5)Recurrent emesis/abdominal pain-check abdominal x-rays to rule  out obstructive findings, Zofran as needed  6)Disposition- drain fluid cx- mult org; continue drain for now; as outpatient recommend once daily irrigation of drain with 5-10 cc sterile normal saline, output recording and dressing changes every 1-2 days.  She can be scheduled for follow-up in IR drain clinic [phone]) within 1 week post discharge. Any drain related questions as OP can be answered by calling the above number or [phone] and asking for IR radiologist on call.  7)ID-blood cultures from 11/12/2017 and urine culture from 11/12/2017 growing pansensitive E. Coli, wound/surgical cultures from 11/13/2017 on 11/15/2017 without definite growth at this time, blood cultures from 11/12/2017 with coagulase-negative staph in 1 out of 2 bottles recent concerns about possible contaminant .  Agent is currently on IV vancomycin and cefepime   Code Status: full Family Communication: d/w patient, SON AND daughter at bedside Disposition Plan: pend clinical improvement    Consultants:  Urology   IR  Procedures:  Pend IR procedure   Antibiotics:  Cefepime 3/5>>>  vanc 3/5>>>>  DVT Prophylaxis  :  Lovenox    Lab Results  Component Value Date   PLT 254 11/15/2017    Inpatient Medications  Scheduled Meds: . clotrimazole  1 Applicatorful Vaginal QHS  . enoxaparin (LOVENOX) injection  40 mg Subcutaneous Q24H  . ezetimibe  10 mg Oral Daily  . hydrocortisone sod succinate (SOLU-CORTEF) inj  25 mg Intravenous Q12H  . insulin aspart  0-20 Units Subcutaneous TID WC  . insulin aspart  0-5 Units Subcutaneous QHS  . insulin glargine  28 Units Subcutaneous QHS   And  . insulin glargine  10 Units Subcutaneous Daily  . levothyroxine  112 mcg Oral QAC breakfast  . sodium chloride flush  5 mL Intracatheter Q8H  . Vitamin D (Ergocalciferol)  50,000 Units Oral Q7 days   Continuous Infusions: . ceFEPime (MAXIPIME) IV Stopped (11/15/17 1024)  . vancomycin Stopped (11/15/17 1503)   PRN  Meds:.acetaminophen, fentaNYL (SUBLIMAZE) injection, hydrALAZINE, lip balm, ondansetron (ZOFRAN) IV, oxyCODONE    Anti-infectives (From admission, onward)   Start     Dose/Rate Route Frequency Ordered Stop   11/13/17 1400  vancomycin (VANCOCIN) IVPB 750 mg/150 ml premix     750 mg 150 mL/hr over 60 Minutes Intravenous Every 24 hours 11/12/17 1816     11/12/17 2000  ceFEPIme (MAXIPIME) 2 g in sodium chloride 0.9 % 100 mL IVPB     2 g 200 mL/hr over 30 Minutes Intravenous Every 12 hours 11/12/17 1816     11/12/17 1515  vancomycin (VANCOCIN) 2,500 mg in sodium chloride 0.9 % 500 mL IVPB     2,500 mg 250 mL/hr over 120 Minutes Intravenous  Once 11/12/17 1440 11/12/17 1741   11/12/17 1445  piperacillin-tazobactam (ZOSYN) IVPB 3.375 g     3.375 g 100 mL/hr over 30 Minutes Intravenous  Once 11/12/17 1438 11/12/17 1554        Objective:   Vitals:   11/14/17 1244 11/14/17 2216 11/15/17 0445 11/15/17 1700  BP: (!) 177/92 (!) 152/70 (!) 145/81 (!) 160/84  Pulse: 78 77 82 78  Resp: 20 20 20 20   Temp: 97.8 F (36.6 C) 98.4 F (36.9 C) 97.7 F (36.5 C) 98.3 F (36.8 C)  TempSrc: Oral Oral Oral Oral  SpO2: 97% 94% 100% 95%  Weight:      Height:        Wt Readings from Last 3 Encounters:  11/13/17 96.7 kg (213 lb 3 oz)  07/03/17 114.7 kg (252 lb 14.4 oz)  04/13/15 109.9 kg (242 lb 3.2 oz)     Intake/Output Summary (Last 24 hours) at 11/15/2017 1850 Last data filed at 11/15/2017 1300 Gross per 24 hour  Intake 480 ml  Output 30 ml  Net 450 ml   Physical Exam  Gen:- Awake Alert,  In no apparent distress  HEENT:- [place].AT, No sclera icterus Neck-Supple Neck,No JVD,.  Lungs-  CTAB  CV- S1, S2 normal Abd-  +ve B.Sounds, Abd Soft, abdominal discomfort without rebound or guarding right flank with drainage tube in scant amount of drainage in the bag    Extremity/Skin:- No  edema,    Psych-affect is appropriate Neuro-no new focal deficits   Data Review:   Micro Results Recent  Results (from the past 240 hour(s))  Blood culture (routine x 2)     Status: None (Preliminary result)   Collection Time: 11/12/17  2:22 PM  Result Value Ref Range Status   Specimen Description   Final    BLOOD LEFT ANTECUBITAL Performed at [facility], [address] [place], [place] [address]    Special Requests   Final    BOTTLES DRAWN AEROBIC AND ANAEROBIC Blood Culture adequate volume Performed at [facility], [address] [place], [place] [address]    Culture   Final    NO GROWTH 3 DAYS Performed at [facility], [address], [place], [place]  [id number]    Report Status PENDING  Incomplete  Blood culture (routine x 2)     Status: Abnormal   Collection Time: 11/12/17  2:49 PM  Result Value Ref Range Status   Specimen Description   Final    BLOOD RIGHT ANTECUBITAL Performed at [facility], [address] [place], [place] [address]    Special Requests   Final    BOTTLES DRAWN AEROBIC AND ANAEROBIC Blood Culture results may not be optimal due to an inadequate volume of blood received in culture bottles Performed at [facility], [address] [place], [place] [address]    Culture  Setup Time   Final    GRAM POSITIVE COCCI AEROBIC BOTTLE ONLY Organism ID to follow    Culture (A)  Final    STAPHYLOCOCCUS SPECIES (COAGULASE NEGATIVE) THE SIGNIFICANCE OF ISOLATING THIS ORGANISM FROM A SINGLE SET OF BLOOD CULTURES WHEN MULTIPLE SETS ARE DRAWN IS UNCERTAIN. PLEASE NOTIFY THE MICROBIOLOGY DEPARTMENT WITHIN ONE WEEK IF SPECIATION AND SENSITIVITIES ARE REQUIRED. Performed at [facility], [address] [place], [place] [address]    Report Status 11/15/2017 FINAL  Final  Blood Culture ID Panel (Reflexed)     Status: Abnormal   Collection Time: 11/12/17  2:49 PM  Result Value Ref Range Status   Enterococcus species NOT DETECTED NOT DETECTED Final   Listeria monocytogenes NOT  DETECTED NOT DETECTED Final   Staphylococcus species DETECTED (A) NOT DETECTED Final    Comment: Methicillin (oxacillin) susceptible coagulase negative staphylococcus. Possible blood culture contaminant (unless isolated from more than one blood culture draw or clinical case suggests pathogenicity). No antibiotic treatment is indicated for blood  culture contaminants. CRITICAL RESULT CALLED TO, READ BACK BY AND VERIFIED WITH: Peggyann JubaJ. Grimsley PharmD 16:15 11/13/17 ([email or web address])    Staphylococcus aureus NOT DETECTED NOT DETECTED Final   Methicillin resistance NOT DETECTED NOT DETECTED Final   Streptococcus species NOT DETECTED NOT DETECTED Final   Streptococcus agalactiae NOT DETECTED NOT DETECTED Final   Streptococcus pneumoniae NOT DETECTED NOT DETECTED Final   Streptococcus pyogenes NOT DETECTED NOT DETECTED Final   Acinetobacter baumannii NOT DETECTED NOT DETECTED Final   Enterobacteriaceae species NOT DETECTED NOT DETECTED Final   Enterobacter cloacae complex NOT DETECTED NOT DETECTED Final   Escherichia coli NOT DETECTED NOT DETECTED Final   Klebsiella oxytoca NOT DETECTED NOT DETECTED Final   Klebsiella pneumoniae NOT DETECTED NOT DETECTED Final   Proteus species NOT DETECTED NOT DETECTED Final   Serratia marcescens NOT DETECTED NOT DETECTED Final   Haemophilus influenzae NOT DETECTED NOT DETECTED Final   Neisseria meningitidis NOT DETECTED NOT DETECTED Final   Pseudomonas aeruginosa NOT DETECTED NOT DETECTED Final   Candida albicans NOT DETECTED NOT DETECTED Final   Candida glabrata NOT DETECTED NOT DETECTED Final   Candida krusei NOT DETECTED NOT DETECTED Final   Candida parapsilosis NOT DETECTED NOT DETECTED Final   Candida tropicalis NOT DETECTED NOT DETECTED Final  Urine culture     Status: Abnormal   Collection Time: 11/12/17  4:10 PM  Result Value Ref Range Status   Specimen Description   Final    URINE, CLEAN CATCH Performed at [facility], [address] [place], [place] [address]    Special Requests   Final    NONE Performed at [facility], [address] [place], [place] [address]    Culture >=100,000 COLONIES/mL ESCHERICHIA COLI (A)  Final   Report Status 11/14/2017  FINAL  Final   Organism ID, Bacteria ESCHERICHIA COLI (A)  Final      Susceptibility   Escherichia coli - MIC*    AMPICILLIN <=2 SENSITIVE Sensitive     CEFAZOLIN <=4 SENSITIVE Sensitive     CEFTRIAXONE <=1 SENSITIVE Sensitive     CIPROFLOXACIN <=0.25 SENSITIVE Sensitive     GENTAMICIN <=1 SENSITIVE Sensitive     IMIPENEM <=0.25 SENSITIVE Sensitive     NITROFURANTOIN <=16 SENSITIVE Sensitive     TRIMETH/SULFA <=20 SENSITIVE Sensitive     AMPICILLIN/SULBACTAM <=2 SENSITIVE Sensitive     PIP/TAZO <=4 SENSITIVE Sensitive     Extended ESBL NEGATIVE Sensitive     * >=100,000 COLONIES/mL ESCHERICHIA COLI  MRSA PCR Screening     Status: None   Collection Time: 11/13/17  3:50 AM  Result Value Ref Range Status   MRSA by PCR NEGATIVE NEGATIVE Final    Comment:        The GeneXpert MRSA Assay (FDA approved for NASAL specimens only), is one component of a comprehensive MRSA colonization surveillance program. It is not intended to diagnose MRSA infection nor to guide or monitor treatment for MRSA infections. Performed at [facility], [address], [place], [place] [address]   Aerobic/Anaerobic Culture (surgical/deep wound)     Status: Abnormal (Preliminary result)   Collection Time: 11/13/17  4:53 PM  Result Value Ref Range Status   Specimen Description   Final    ABSCESS BACK Performed at [facility], [address], [place], [place] [address]    Special Requests   Final    NONE Performed at [facility], [address] [place], [place] [address]    Gram Stain   Final    ABUNDANT WBC PRESENT, PREDOMINANTLY PMN MODERATE GRAM NEGATIVE RODS Performed at [facility], [address], [place], [place] [address]    Culture (A)  Final    MULTIPLE ORGANISMS PRESENT, NONE PREDOMINANT NO ANAEROBES ISOLATED; CULTURE IN PROGRESS FOR 5 DAYS    Report Status PENDING  Incomplete    Radiology Reports Dg Chest 2 View  Result Date: 11/12/2017 CLINICAL DATA:  Night sweats for 2 weeks. History of diabetes and hypertension. EXAM: CHEST - 2 VIEW COMPARISON:  Chest radiograph July 07, 2017 FINDINGS: Cardiac silhouette is mildly enlarged. Improved aeration lungs, however there is persistent bronchial wall thickening with bandlike density RIGHT lung base. Mildly elevated RIGHT hemidiaphragm. No pleural effusion. No pneumothorax. Soft tissue planes and included osseous structures are non suspicious. IMPRESSION: Bronchitic changes with RIGHT lung base atelectasis, less likely pneumonia. Stable cardiomegaly. Electronically Signed   By: Awilda Metro M.D.   On: 11/12/2017 17:12   Ct Abdomen Pelvis W Contrast  Result Date: 11/12/2017 CLINICAL DATA:  Right upper quadrant pain x2 weeks, melena, history of renal stones. EXAM: CT ABDOMEN AND PELVIS WITH CONTRAST TECHNIQUE: Multidetector CT imaging of the abdomen and pelvis was performed using the standard protocol following bolus administration of intravenous contrast. CONTRAST:  ISOVUE-300 IOPAMIDOL (ISOVUE-300) INJECTION 61% COMPARISON:  06/27/2017 FINDINGS: Lower chest: Lung bases are clear. Hepatobiliary: Nodular hepatic contour, suggesting cirrhosis. No focal hepatic lesion is seen. Layering 6 mm gallstone (series 2/image 42), without associated inflammatory changes. No intrahepatic or extrahepatic ductal dilatation. Pancreas: Within normal limits. Spleen: Spleen is normal in size. Adrenals/Urinary Tract: 1.7 cm right adrenal nodule (series 2/image 26; sagittal image 82), indeterminate, although favored to be present in 2013, suggesting a benign adrenal adenoma.  Left adrenal gland is within normal limits. Multiple bilateral renal cysts,  measuring up to 5.2 cm in the anterior interpolar right kidney (series 2/image 36). No hydronephrosis. Abnormal soft tissue/inflammation with heterogeneous enhancement of the right lower kidney (series 2/image 44). Secondary involvement of the right psoas muscle with associated centrally necrotic 3.8 x 5.7 x 6.6 cm lesion in the right psoas (series 2/image 47). Associated fluid/gas in the right retroperitoneum/posterior pararenal space, measuring up to 5.9 x 4.9 x 6.0 cm (series 2/image 54), although this does not reflect a well-defined fluid collection. Overall, given this constellation of findings, this appearance favors pyelonephritis with associated psoas abscess and gas-forming infection extending into the retroperitoneum/posterior pararenal space. Technically speaking, some of this abnormal soft tissue and the right psoas abnormality could reflect tumor, but this would not account for the associated soft tissue inflammation/gas. Bladder is within normal limits. Stomach/Bowel: Stomach is within normal limits. No evidence of bowel obstruction. Normal appendix (series 2/image 62). Notably, the ascending colon/cecum and appendix are remote from the fluid/gas in the posterior pararenal space. Vascular/Lymphatic: No evidence of abdominal aortic aneurysm. Atherosclerotic calcifications of the abdominal aorta and branch vessels. No suspicious abdominopelvic lymphadenopathy. Reproductive: Uterus is within normal limits. Bilateral ovaries are within normal limits. Other: No abdominopelvic ascites. Musculoskeletal: Right psoas lesion, as described above. Visualized osseous structures are within normal limits. IMPRESSION: Abnormal soft tissue/inflammation with heterogeneous enhancement of the right lower kidney, favoring pyelonephritis, with associated psoas abscess and gas-forming infection extending into the right retroperitoneum/posterior pararenal space. Suspected psoas abscess currently measures up to 6.6 cm.  Ill-defined fluid/gas in the right posterior pararenal space measures to 6.0 cm, but this does not reflect a well-defined fluid collection. Additional ancillary findings as above, including cirrhosis, cholelithiasis, and bilateral renal cysts. These results were called by telephone at the time of interpretation on 11/12/2017 at 11:39 am to [facility] , who verbally acknowledged these results. Electronically Signed   By: Charline Bills M.D.   On: 11/12/2017 11:41   Dg Abd Acute W/chest  Result Date: 11/14/2017 CLINICAL DATA:  Nausea. Incision site pain. Pain at the incision site. EXAM: DG ABDOMEN ACUTE W/ 1V CHEST COMPARISON:  CT abdomen pelvis 11/12/2017. FINDINGS: Monitoring leads overlie the patient. Stable cardiac and mediastinal contours. Right basilar heterogeneous opacities. No pleural effusion or pneumothorax. Pigtail drainage catheter projects over the right hemiabdomen. Gas is demonstrated within nondilated loops of large and small bowel in a nonobstructed pattern. No free intraperitoneal air. Unremarkable osseous skeleton. IMPRESSION: Right basilar opacities favored to represent atelectasis. Pigtail drainage catheter projects over the right hemiabdomen. Nonobstructed bowel gas pattern. Electronically Signed   By: Annia Belt M.D.   On: 11/14/2017 20:11   Ct Image Guided Drainage By Percutaneous Catheter  Result Date: 11/13/2017 CLINICAL DATA:  Right psoas abscess EXAM: CT GUIDED DRAINAGE OF RIGHT PSOAS ABSCESS ANESTHESIA/SEDATION: Intravenous Fentanyl and Versed were administered as conscious sedation during continuous monitoring of the patient's level of consciousness and physiological / cardiorespiratory status by the radiology RN, with a total moderate sedation time of 14 minutes. PROCEDURE: The procedure, risks, benefits, and alternatives were explained to the patient. Questions regarding the procedure were encouraged and answered. The patient understands and consents to the procedure.  Patient placed in left lateral decubitus position. Limited axial scans through the abdomen were obtained. The right psoas collection was localized and an appropriate skin entry site was determined and marked. The operative field was prepped with chlorhexidinein a sterile fashion, and a sterile drape  was applied covering the operative field. A sterile gown and sterile gloves were used for the procedure. Local anesthesia was provided with 1% Lidocaine. Under CT fluoroscopic guidance, an 18 gauge trocar needle was advanced into the collection using a right paraspinal approach. Once needle tip was seen in the collection, aspiration VL purulent material. An Amplatz wire advanced easily, its position confirmed on CT. Tract dilated to facilitate placement of a 14 French 12 French pigtail drain catheter, formed centrally within the collection. CT confirmed appropriate drain catheter position. 20 mL of blood-tinged purulent aspirate were sent for Gram stain and culture. The catheter was flushed and placed to external gravity drain bag. The catheter was secured externally with 0 Prolene suture and StatLock. The patient tolerated the procedure well. COMPLICATIONS: None immediate FINDINGS: The right psoas abscess was again localized. 12 French abscess drain catheter placed. Sample of the aspirate sent for Gram stain and culture. IMPRESSION: 1. Technically successful CT-guided right psoas abscess drain catheter placement Electronically Signed   By: Corlis Leak M.D.   On: 11/13/2017 17:44     CBC Recent Labs  Lab 11/12/17 1252 11/13/17 0336 11/13/17 0451 11/14/17 0310 11/15/17 0435  WBC 34.1* 35.3* 32.3* 28.3* 23.7*  HGB 12.7 11.6* 10.7* 10.8* 10.7*  HCT 37.0 34.0* 31.7* 32.2* 31.8*  PLT 297 251 253 253 254  MCV 86.7 87.9 87.3 88.2 88.1  MCH 29.7 30.0 29.5 29.6 29.6  MCHC 34.3 34.1 33.8 33.5 33.6  RDW 14.5 14.8 14.7 14.7 14.7    Chemistries  Recent Labs  Lab 11/12/17 1347 11/13/17 0336 11/13/17 0451  11/14/17 0310 11/15/17 0435  NA 132* 133* 137 134* 137  K 5.7* 4.6 4.6 4.7 4.5  CL 98* 104 107 107 109  CO2 25 22 24  21* 23  GLUCOSE 520* 428* 424* 455* 302*  BUN 23* 25* 24* 24* 22*  CREATININE 1.39* 1.31* 1.35* 1.26* 1.06*  CALCIUM 9.1 8.3* 8.6* 8.1* 8.6*  AST 41 25  --   --   --   ALT 33 27  --   --   --   ALKPHOS 191* 159*  --   --   --   BILITOT 1.5* 0.9  --   --   --    ------------------------------------------------------------------------------------------------------------------ No results for input(s): CHOL, HDL, LDLCALC, TRIG, CHOLHDL, LDLDIRECT in the last 72 hours.  Lab Results  Component Value Date   HGBA1C 12.0 (H) 11/12/2017   ------------------------------------------------------------------------------------------------------------------ No results for input(s): TSH, T4TOTAL, T3FREE, THYROIDAB in the last 72 hours.  Invalid input(s): FREET3 ------------------------------------------------------------------------------------------------------------------ No results for input(s): VITAMINB12, FOLATE, FERRITIN, TIBC, IRON, RETICCTPCT in the last 72 hours.  Coagulation profile Recent Labs  Lab 11/12/17 1347  INR 1.27    No results for input(s): DDIMER in the last 72 hours.  Cardiac Enzymes No results for input(s): CKMB, TROPONINI, MYOGLOBIN in the last 168 hours.  Invalid input(s): CK ------------------------------------------------------------------------------------------------------------------    Component Value Date/Time   BNP 82.4 06/27/2017 1003     Chantrell Apsey M.D on 11/15/2017 at 6:50 PM  Between 7am to 7pm - Pager - [phone]  After 7pm go to [email or web address] - password [email or web address]  [facility] -  Office  [phone]   Voice Recognition Reubin Milan dictation system was used to create this note, attempts have been made to correct errors. Please contact the author with questions and/or clarifications.

## 2017-11-15 NOTE — Progress Notes (Signed)
Referring Physician(s): Manny,T  Supervising Physician: Irish LackYamagata, Glenn  [facility]  [facility]  Chief Complaint:  Right psoas abscess  Subjective: Patient doing fairly well today.  Continues to have some mild right flank/upper thigh discomfort but better overall.  Has been constipated.  Denies nausea/ vomiting.   Allergies: Codeine  Medications: Prior to Admission medications   Medication Sig Start Date End Date Taking? Authorizing Provider  albuterol (PROVENTIL HFA;VENTOLIN HFA) 108 (90 Base) MCG/ACT inhaler Inhale 2 puffs into the lungs every 6 (six) hours as needed for wheezing or shortness of breath. 07/03/17  Yes Rhetta MuraSamtani, Jai-Gurmukh, MD  amLODipine (NORVASC) 10 MG tablet Take 10 mg by mouth daily.   Yes [provider]  carvedilol (COREG) 6.25 MG tablet Take 6.25 mg by mouth 2 (two) times daily with a meal.   Yes [provider]  ezetimibe (ZETIA) 10 MG tablet Take 10 mg by mouth daily.   Yes [provider]  insulin aspart (NOVOLOG) 100 UNIT/ML injection Before each meal 3 times a day, 120-150 - 2 units, 151-200 - 4 units, 201-250 - 8 units, 251-300 - 10 units,  301 or above 10 units and call your MD Patient taking differently: Inject 10 Units into the skin 2 (two) times daily.  12/10/11  Yes Leroy SeaSingh, Prashant K, MD  Insulin Detemir (LEVEMIR FLEXPEN Hendricks) Inject 28 Units into the skin at bedtime.   Yes [provider]  insulin glargine (LANTUS) 100 UNIT/ML injection Take 30 Units in the morning and 10 units at Night. Patient taking differently: Inject 10 Units into the skin 2 (two) times daily.  04/13/15  Yes Osvaldo ShipperKrishnan, Gokul, MD  levothyroxine (SYNTHROID, LEVOTHROID) 150 MCG tablet Take 1 tablet (150 mcg total) by mouth daily before breakfast. Patient taking differently: Take 112 mcg by mouth daily before breakfast.  04/13/15  Yes Osvaldo ShipperKrishnan, Gokul, MD  losartan (COZAAR) 100 MG tablet Take 100 mg by mouth daily.   Yes [provider]  mercaptopurine (PURINETHOL) 50 MG tablet Take 150 mg by mouth daily. Give on an empty stomach 1 hour before or 2 hours after meals. Caution: Chemotherapy.   Yes [provider]  naproxen sodium (ALEVE) 220 MG tablet Take 220 mg by mouth daily as needed (PAIN).   Yes [provider]  predniSONE (DELTASONE) 20 MG tablet Take 2 tablets (40 mg total) by mouth daily with breakfast. Patient taking differently: Take 10 mg by mouth daily with breakfast.  04/13/15  Yes Osvaldo ShipperKrishnan, Gokul, MD  terconazole (TERAZOL 7) 0.4 % vaginal cream Place 1 applicator vaginally daily. 10/21/17  Yes [provider]  Vitamin D, Ergocalciferol, (DRISDOL) 50000 units CAPS capsule Take 50,000 Units by mouth every 7 (seven) days.   Yes [provider]  cefUROXime (CEFTIN) 500 MG tablet Take 1 tablet (500 mg total) by mouth daily with breakfast. Patient not taking: Reported on 11/12/2017 07/03/17   Rhetta MuraSamtani, Jai-Gurmukh, MD  Spacer/Aero-Holding Chambers (BREATHERITE COLL SPACER ADULT) MISC 1 Device by Does not apply route every 6 (six) hours as needed. 07/03/17   Rhetta MuraSamtani, Jai-Gurmukh, MD     Vital Signs: BP (!) 145/81 (BP Location: Right Arm)   Pulse 82   Temp 97.7 F (36.5 C) (Oral)   Resp 20   Ht 5\' 2"  (1.575 m)   Wt 213 lb 3 oz (96.7 kg)   SpO2 100%   BMI 38.99 kg/m   Physical Exam right flank drain intact, insertion site okay, mildly tender to palpation.  Output 30 cc purulent blood- tinged fluid.  Drain irrigated without difficulty.  Imaging: Dg Chest 2 View  Result Date: 11/12/2017 CLINICAL DATA:  Night sweats for 2 weeks. History of diabetes and hypertension. EXAM: CHEST - 2 VIEW COMPARISON:  Chest radiograph July 07, 2017 FINDINGS: Cardiac silhouette is mildly enlarged. Improved aeration lungs, however there is persistent bronchial wall thickening with bandlike density RIGHT lung base. Mildly elevated RIGHT hemidiaphragm. No pleural effusion. No pneumothorax. Soft tissue  planes and included osseous structures are non suspicious. IMPRESSION: Bronchitic changes with RIGHT lung base atelectasis, less likely pneumonia. Stable cardiomegaly. Electronically Signed   By: Awilda Metro M.D.   On: 11/12/2017 17:12   Ct Abdomen Pelvis W Contrast  Result Date: 11/12/2017 CLINICAL DATA:  Right upper quadrant pain x2 weeks, melena, history of renal stones. EXAM: CT ABDOMEN AND PELVIS WITH CONTRAST TECHNIQUE: Multidetector CT imaging of the abdomen and pelvis was performed using the standard protocol following bolus administration of intravenous contrast. CONTRAST:  ISOVUE-300 IOPAMIDOL (ISOVUE-300) INJECTION 61% COMPARISON:  06/27/2017 FINDINGS: Lower chest: Lung bases are clear. Hepatobiliary: Nodular hepatic contour, suggesting cirrhosis. No focal hepatic lesion is seen. Layering 6 mm gallstone (series 2/image 42), without associated inflammatory changes. No intrahepatic or extrahepatic ductal dilatation. Pancreas: Within normal limits. Spleen: Spleen is normal in size. Adrenals/Urinary Tract: 1.7 cm right adrenal nodule (series 2/image 26; sagittal image 82), indeterminate, although favored to be present in 2013, suggesting a benign adrenal adenoma. Left adrenal gland is within normal limits. Multiple bilateral renal cysts, measuring up to 5.2 cm in the anterior interpolar right kidney (series 2/image 36). No hydronephrosis. Abnormal soft tissue/inflammation with heterogeneous enhancement of the right lower kidney (series 2/image 44). Secondary involvement of the right psoas muscle with associated centrally necrotic 3.8 x 5.7 x 6.6 cm lesion in the right psoas (series 2/image 47). Associated fluid/gas in the right retroperitoneum/posterior pararenal space, measuring up to 5.9 x 4.9 x 6.0 cm (series 2/image 54), although this does not reflect a well-defined fluid collection. Overall, given this constellation of findings, this appearance favors pyelonephritis with associated psoas  abscess and gas-forming infection extending into the retroperitoneum/posterior pararenal space. Technically speaking, some of this abnormal soft tissue and the right psoas abnormality could reflect tumor, but this would not account for the associated soft tissue inflammation/gas. Bladder is within normal limits. Stomach/Bowel: Stomach is within normal limits. No evidence of bowel obstruction. Normal appendix (series 2/image 62). Notably, the ascending colon/cecum and appendix are remote from the fluid/gas in the posterior pararenal space. Vascular/Lymphatic: No evidence of abdominal aortic aneurysm. Atherosclerotic calcifications of the abdominal aorta and branch vessels. No suspicious abdominopelvic lymphadenopathy. Reproductive: Uterus is within normal limits. Bilateral ovaries are within normal limits. Other: No abdominopelvic ascites. Musculoskeletal: Right psoas lesion, as described above. Visualized osseous structures are within normal limits. IMPRESSION: Abnormal soft tissue/inflammation with heterogeneous enhancement of the right lower kidney, favoring pyelonephritis, with associated psoas abscess and gas-forming infection extending into the right retroperitoneum/posterior pararenal space. Suspected psoas abscess currently measures up to 6.6 cm. Ill-defined fluid/gas in the right posterior pararenal space measures to 6.0 cm, but this does not reflect a well-defined fluid collection. Additional ancillary findings as above, including cirrhosis, cholelithiasis, and bilateral renal cysts. These results were called by telephone at the time of interpretation on 11/12/2017 at 11:39 am to [facility] , who verbally acknowledged these results. Electronically Signed   By: Charline Bills M.D.   On: 11/12/2017 11:41   Dg Abd Acute W/chest  Result Date: 11/14/2017 CLINICAL DATA:  Nausea. Incision site pain. Pain at the incision site. EXAM: DG ABDOMEN ACUTE W/ 1V CHEST COMPARISON:  CT abdomen pelvis 11/12/2017.  FINDINGS: Monitoring leads overlie the patient. Stable cardiac and mediastinal contours. Right basilar heterogeneous opacities. No pleural effusion or pneumothorax. Pigtail drainage catheter projects over the right hemiabdomen. Gas is demonstrated within nondilated loops of large and small bowel in a nonobstructed pattern. No free intraperitoneal air. Unremarkable osseous skeleton. IMPRESSION: Right basilar opacities favored to represent atelectasis. Pigtail drainage catheter projects over the right hemiabdomen. Nonobstructed bowel gas pattern. Electronically Signed   By: Annia Belt M.D.   On: 11/14/2017 20:11   Ct Image Guided Drainage By Percutaneous Catheter  Result Date: 11/13/2017 CLINICAL DATA:  Right psoas abscess EXAM: CT GUIDED DRAINAGE OF RIGHT PSOAS ABSCESS ANESTHESIA/SEDATION: Intravenous Fentanyl and Versed were administered as conscious sedation during continuous monitoring of the patient's level of consciousness and physiological / cardiorespiratory status by the radiology RN, with a total moderate sedation time of 14 minutes. PROCEDURE: The procedure, risks, benefits, and alternatives were explained to the patient. Questions regarding the procedure were encouraged and answered. The patient understands and consents to the procedure. Patient placed in left lateral decubitus position. Limited axial scans through the abdomen were obtained. The right psoas collection was localized and an appropriate skin entry site was determined and marked. The operative field was prepped with chlorhexidinein a sterile fashion, and a sterile drape was applied covering the operative field. A sterile gown and sterile gloves were used for the procedure. Local anesthesia was provided with 1% Lidocaine. Under CT fluoroscopic guidance, an 18 gauge trocar needle was advanced into the collection using a right paraspinal approach. Once needle tip was seen in the collection, aspiration VL purulent material. An Amplatz wire  advanced easily, its position confirmed on CT. Tract dilated to facilitate placement of a 14 French 12 French pigtail drain catheter, formed centrally within the collection. CT confirmed appropriate drain catheter position. 20 mL of blood-tinged purulent aspirate were sent for Gram stain and culture. The catheter was flushed and placed to external gravity drain bag. The catheter was secured externally with 0 Prolene suture and StatLock. The patient tolerated the procedure well. COMPLICATIONS: None immediate FINDINGS: The right psoas abscess was again localized. 12 French abscess drain catheter placed. Sample of the aspirate sent for Gram stain and culture. IMPRESSION: 1. Technically successful CT-guided right psoas abscess drain catheter placement Electronically Signed   By: Corlis Leak M.D.   On: 11/13/2017 17:44    Labs:  CBC: Recent Labs    11/13/17 0336 11/13/17 0451 11/14/17 0310 11/15/17 0435  WBC 35.3* 32.3* 28.3* 23.7*  HGB 11.6* 10.7* 10.8* 10.7*  HCT 34.0* 31.7* 32.2* 31.8*  PLT 251 253 253 254    COAGS: Recent Labs    06/26/17 2156 11/12/17 1347  INR 1.67 1.27  APTT 39*  --     BMP: Recent Labs    11/13/17 0336 11/13/17 0451 11/14/17 0310 11/15/17 0435  NA 133* 137 134* 137  K 4.6 4.6 4.7 4.5  CL 104 107 107 109  CO2 22 24 21* 23  GLUCOSE 428* 424* 455* 302*  BUN 25* 24* 24* 22*  CALCIUM 8.3* 8.6* 8.1* 8.6*  CREATININE 1.31* 1.35* 1.26* 1.06*  GFRNONAA 43* 41* 45* 55*  GFRAA 50* 48* 52* >60    LIVER FUNCTION TESTS: Recent Labs    06/30/17 1234 07/01/17 0353 07/03/17 0323 11/12/17 1347 11/13/17 0336  BILITOT  3.9* 2.3* 1.7* 1.5* 0.9  AST 104*  --  63* 41 25  ALT 49  --  41 33 27  ALKPHOS 260*  --  313* 191* 159*  PROT 6.8  --  7.7 8.8* 7.8  ALBUMIN 1.2*  --  1.5* 2.3* 2.0*    Assessment and Plan: S/p rt psoas abscess drain 3/6; afebrile; WBC 23.7(28.3), hgb stable; creat 1.06(1.26); urine culture- E coli, drain fluid cx- mult org; continue drain  for now; as outpatient recommend once daily irrigation of drain with 5-10 cc sterile normal saline, output recording and dressing changes every 1-2 days.  She can be scheduled for follow-up in IR drain clinic [phone]) within 1 week post discharge. Any drain related questions as OP can be answered by calling the above number or [phone] and asking for IR radiologist on call.     Electronically Signed: D. Jeananne Rama, PA-C 11/15/2017, 1:47 PM   I spent a total of 15 minutes at the the patient's bedside AND on the patient's hospital floor or unit, greater than 50% of which was counseling/coordinating care for right psoas abscess drain    Patient ID: Dana Thornton, female   DOB: 24-Feb-1956, 62 y.o.   MRN: [id number]

## 2017-11-16 LAB — GLUCOSE, CAPILLARY
GLUCOSE-CAPILLARY: 319 mg/dL — AB (ref 65–99)
GLUCOSE-CAPILLARY: 228 mg/dL — AB (ref 65–99)
GLUCOSE-CAPILLARY: 284 mg/dL — AB (ref 65–99)
Glucose-Capillary: 225 mg/dL — ABNORMAL HIGH (ref 65–99)
Glucose-Capillary: 356 mg/dL — ABNORMAL HIGH (ref 65–99)
Glucose-Capillary: 307 mg/dL — ABNORMAL HIGH (ref 65–99)

## 2017-11-16 MED ORDER — INSULIN GLARGINE 100 UNIT/ML ~~LOC~~ SOLN
30.0000 [IU] | Freq: Every day | SUBCUTANEOUS | Status: DC
  Administered 2017-11-16: 30 [IU] via SUBCUTANEOUS
  Filled 2017-11-16 (×2): qty 0.3

## 2017-11-16 MED ORDER — PREDNISONE 20 MG PO TABS
20.0000 mg | ORAL_TABLET | Freq: Every day | ORAL | Status: DC
  Administered 2017-11-17: 20 mg via ORAL
  Filled 2017-11-16: qty 1

## 2017-11-16 MED ORDER — SODIUM CHLORIDE 0.9 % IV SOLN
2.0000 g | INTRAVENOUS | Status: DC
  Administered 2017-11-17: 2 g via INTRAVENOUS
  Filled 2017-11-16: qty 2

## 2017-11-16 MED ORDER — CARVEDILOL 6.25 MG PO TABS
6.2500 mg | ORAL_TABLET | Freq: Two times a day (BID) | ORAL | Status: DC
  Administered 2017-11-16 – 2017-11-17 (×2): 6.25 mg via ORAL
  Filled 2017-11-16 (×2): qty 1

## 2017-11-16 MED ORDER — INSULIN GLARGINE 100 UNIT/ML ~~LOC~~ SOLN
12.0000 [IU] | Freq: Every day | SUBCUTANEOUS | Status: DC
  Administered 2017-11-17: 12 [IU] via SUBCUTANEOUS
  Filled 2017-11-16: qty 0.12

## 2017-11-16 MED ORDER — HYDRALAZINE HCL 20 MG/ML IJ SOLN
10.0000 mg | INTRAMUSCULAR | Status: DC | PRN
  Administered 2017-11-16: 10 mg via INTRAVENOUS
  Filled 2017-11-16: qty 1

## 2017-11-16 MED ORDER — AMLODIPINE BESYLATE 10 MG PO TABS
10.0000 mg | ORAL_TABLET | Freq: Every day | ORAL | Status: DC
  Administered 2017-11-16 – 2017-11-17 (×2): 10 mg via ORAL
  Filled 2017-11-16 (×2): qty 1

## 2017-11-16 NOTE — Progress Notes (Signed)
Patient Demographics:    Dana Thornton, is a 62 y.o. female, DOB - 12-Feb-1956, ZOX:[id number]  Admit date - 11/12/2017   Admitting Physician Esperanza Sheets, MD  Outpatient Primary MD for the patient is Maurice Small, MD  LOS - 4   Chief Complaint  Patient presents with  . Flank Pain        Subjective:    Dana Thornton today has no fevers, no emesis,  No chest pain,  Son and Daughter at bedside, no f/c , tolerating oral intake, elevated BP noted  Assessment  & Plan :    Active Problems:   Diabetes type 2, uncontrolled (HCC)   Autoimmune hepatitis (HCC)   Uncontrolled diabetes mellitus type 2 without complications (HCC)   Sepsis (HCC)   Acute pyelonephritis   Psoas abscess (HCC)   Acute lower UTI   Assessment and Plan: Brief summary   62 y.o.femalewith PMH of HTN, DM, Nephrolithiasis, Autoimmune hepatitis (on prednisone), CKD, E coli-UTI and Sepsis (06/2017) presented with progressively worsening right flank pain, found to have right-sided pyelonephritis and psoas abscess status post IR drainage on 11/13/2017   Assessment/Plan:  1)E coli Psoas abscess/Acute right pyelonephritis-  S/p Rt psoas abscess drain 11/13/17, right flank pain persist,  afebrile;  drain fluid cx from 11/13/17 and 11/15/17 without "definite" growth, urine and blood cultures with pansensitive E. coli .  Interventional Radiology and urology input appreciated,-cont vanc/cefepime started on 11/12/17, (plan to switch to Rocephin on 11/17/17), pt has persistent leukocytosis due to steroids  2)DM.Uncontrolled.-Worsening hyperglycemia due to stress of infection and steroids, increase Lantus  To 30 u PM,  And 12 U qAM.  use high-dose insulin sliding scale  3)HTN. -  BP is elevated, Restart amlodipine 10 mg daily , and coreg 6.25 mg bid, hold losartan ,   may use IV Hydralazine 10 mg  Every 4 hours Prn for systolic blood pressure  over 811 mmhg  4)Autoimmune hepatitis. Patient is on chronic intermittent prednisone, and mercaptopurine.  Will hold mercaptopurine , STOP IV Solu-Cortef 25 mg q 12h , use prednisone 20 mg daily  5)ID-   blood cultures from 11/12/2017 and urine culture from 11/12/2017 growing pansensitive E. Coli, wound/surgical cultures from 11/13/2017 on 11/15/2017 without "definite" growth at this time, blood cultures from 11/12/2017 with coagulase-negative staph in 1 out of 2 bottles recent concerns about possible contaminant .  c/nIV vancomycin and cefepime, will switch to iv Rocephin on 11/17/17  6)Disposition- drain fluid cx- mult org; continue drain for now; as outpatient recommend once daily irrigation of drain with 5-10 cc sterile normal saline, output recording and dressing changes every 1-2 days.  She can be scheduled for follow-up in IR drain clinic [phone]) within 1 week post discharge. Any drain related questions as OP can be answered by calling the above number or [phone] and asking for IR radiologist on call.   Code Status: full Family Communication: d/w patient, SON AND daughter at bedside Disposition Plan: pend clinical improvement    Consultants:  Urology   IR  Procedures:  Pend IR procedure   Antibiotics:  Cefepime 3/5>>>  vanc 3/5>>>> Rocephin 11/17/17  DVT Prophylaxis  :  Lovenox    Lab Results  Component Value Date   PLT 254  11/15/2017    Inpatient Medications  Scheduled Meds: . amLODipine  10 mg Oral Daily  . carvedilol  6.25 mg Oral BID WC  . clotrimazole  1 Applicatorful Vaginal QHS  . enoxaparin (LOVENOX) injection  40 mg Subcutaneous Q24H  . ezetimibe  10 mg Oral Daily  . insulin aspart  0-20 Units Subcutaneous TID WC  . insulin aspart  0-5 Units Subcutaneous QHS  . [START ON 11/17/2017] insulin glargine  12 Units Subcutaneous Daily   And  . insulin glargine  30 Units Subcutaneous QHS  . levothyroxine  112 mcg Oral QAC breakfast  . [START ON  11/17/2017] predniSONE  20 mg Oral Q breakfast  . sodium chloride flush  5 mL Intracatheter Q8H  . Vitamin D (Ergocalciferol)  50,000 Units Oral Q7 days   Continuous Infusions: . ceFEPime (MAXIPIME) IV Stopped (11/16/17 1047)  . [START ON 11/17/2017] cefTRIAXone (ROCEPHIN)  IV    . vancomycin 750 mg (11/16/17 1328)   PRN Meds:.acetaminophen, fentaNYL (SUBLIMAZE) injection, hydrALAZINE, lip balm, ondansetron (ZOFRAN) IV, oxyCODONE    Anti-infectives (From admission, onward)   Start     Dose/Rate Route Frequency Ordered Stop   11/17/17 0800  cefTRIAXone (ROCEPHIN) 2 g in sodium chloride 0.9 % 100 mL IVPB     2 g 200 mL/hr over 30 Minutes Intravenous Every 24 hours 11/16/17 1421     11/13/17 1400  vancomycin (VANCOCIN) IVPB 750 mg/150 ml premix     750 mg 150 mL/hr over 60 Minutes Intravenous Every 24 hours 11/12/17 1816     11/12/17 2000  ceFEPIme (MAXIPIME) 2 g in sodium chloride 0.9 % 100 mL IVPB     2 g 200 mL/hr over 30 Minutes Intravenous Every 12 hours 11/12/17 1816 11/17/17 0759   11/12/17 1515  vancomycin (VANCOCIN) 2,500 mg in sodium chloride 0.9 % 500 mL IVPB     2,500 mg 250 mL/hr over 120 Minutes Intravenous  Once 11/12/17 1440 11/12/17 1741   11/12/17 1445  piperacillin-tazobactam (ZOSYN) IVPB 3.375 g     3.375 g 100 mL/hr over 30 Minutes Intravenous  Once 11/12/17 1438 11/12/17 1554        Objective:   Vitals:   11/15/17 0445 11/15/17 1700 11/15/17 2135 11/16/17 0700  BP: (!) 145/81 (!) 160/84 (!) 163/83 (!) 175/56  Pulse: 82 78 66 65  Resp: 20 20 20 20   Temp: 97.7 F (36.5 C) 98.3 F (36.8 C) 97.6 F (36.4 C) (!) 97.5 F (36.4 C)  TempSrc: Oral Oral Oral Oral  SpO2: 100% 95% 96% 98%  Weight:      Height:        Wt Readings from Last 3 Encounters:  11/13/17 96.7 kg (213 lb 3 oz)  07/03/17 114.7 kg (252 lb 14.4 oz)  04/13/15 109.9 kg (242 lb 3.2 oz)     Intake/Output Summary (Last 24 hours) at 11/16/2017 1428 Last data filed at 11/16/2017 0900 Gross  per 24 hour  Intake 785 ml  Output -  Net 785 ml   Physical Exam  Gen:- Awake Alert,  Resting with her kids at bedside HEENT:- [place].AT, No sclera icterus Neck-Supple Neck,No JVD,.  Lungs-  CTAB  CV- S1, S2 normal Abd-  +ve B.Sounds, Abd Soft, abdominal discomfort without rebound or guarding right flank with drainage tube in scant amount of drainage in the bag    Extremity/Skin:- No  edema,    Psych-affect is appropriate Neuro-no new focal deficits   Data Review:   Micro Results  Recent Results (from the past 240 hour(s))  Blood culture (routine x 2)     Status: None (Preliminary result)   Collection Time: 11/12/17  2:22 PM  Result Value Ref Range Status   Specimen Description   Final    BLOOD LEFT ANTECUBITAL Performed at [facility], [address] [place], [place] [address]    Special Requests   Final    BOTTLES DRAWN AEROBIC AND ANAEROBIC Blood Culture adequate volume Performed at [facility], [address], [place], [place] [address]    Culture   Final    NO GROWTH 4 DAYS Performed at [facility], [address] [place], [place] [address]    Report Status PENDING  Incomplete  Blood culture (routine x 2)     Status: Abnormal   Collection Time: 11/12/17  2:49 PM  Result Value Ref Range Status   Specimen Description   Final    BLOOD RIGHT ANTECUBITAL Performed at [facility], [address], [place], [place] [address]    Special Requests   Final    BOTTLES DRAWN AEROBIC AND ANAEROBIC Blood Culture results may not be optimal due to an inadequate volume of blood received in culture bottles Performed at [facility], [address], [place], [place] [address]    Culture  Setup Time   Final    GRAM POSITIVE COCCI AEROBIC BOTTLE ONLY Organism ID to follow    Culture (A)  Final    STAPHYLOCOCCUS SPECIES (COAGULASE NEGATIVE) THE SIGNIFICANCE OF ISOLATING THIS ORGANISM FROM A SINGLE  SET OF BLOOD CULTURES WHEN MULTIPLE SETS ARE DRAWN IS UNCERTAIN. PLEASE NOTIFY THE MICROBIOLOGY DEPARTMENT WITHIN ONE WEEK IF SPECIATION AND SENSITIVITIES ARE REQUIRED. Performed at [facility], [address], [place], [place] [address]    Report Status 11/15/2017 FINAL  Final  Blood Culture ID Panel (Reflexed)     Status: Abnormal   Collection Time: 11/12/17  2:49 PM  Result Value Ref Range Status   Enterococcus species NOT DETECTED NOT DETECTED Final   Listeria monocytogenes NOT DETECTED NOT DETECTED Final   Staphylococcus species DETECTED (A) NOT DETECTED Final    Comment: Methicillin (oxacillin) susceptible coagulase negative staphylococcus. Possible blood culture contaminant (unless isolated from more than one blood culture draw or clinical case suggests pathogenicity). No antibiotic treatment is indicated for blood  culture contaminants. CRITICAL RESULT CALLED TO, READ BACK BY AND VERIFIED WITH: Peggyann Juba PharmD 16:15 11/13/17 ([email or web address])    Staphylococcus aureus NOT DETECTED NOT DETECTED Final   Methicillin resistance NOT DETECTED NOT DETECTED Final   Streptococcus species NOT DETECTED NOT DETECTED Final   Streptococcus agalactiae NOT DETECTED NOT DETECTED Final   Streptococcus pneumoniae NOT DETECTED NOT DETECTED Final   Streptococcus pyogenes NOT DETECTED NOT DETECTED Final   Acinetobacter baumannii NOT DETECTED NOT DETECTED Final   Enterobacteriaceae species NOT DETECTED NOT DETECTED Final   Enterobacter cloacae complex NOT DETECTED NOT DETECTED Final   Escherichia coli NOT DETECTED NOT DETECTED Final   Klebsiella oxytoca NOT DETECTED NOT DETECTED Final   Klebsiella pneumoniae NOT DETECTED NOT DETECTED Final   Proteus species NOT DETECTED NOT DETECTED Final   Serratia marcescens NOT DETECTED NOT DETECTED Final   Haemophilus influenzae NOT DETECTED NOT DETECTED Final   Neisseria meningitidis NOT DETECTED NOT DETECTED Final   Pseudomonas aeruginosa NOT DETECTED NOT  DETECTED Final   Candida albicans NOT DETECTED NOT DETECTED Final   Candida glabrata NOT DETECTED  NOT DETECTED Final   Candida krusei NOT DETECTED NOT DETECTED Final   Candida parapsilosis NOT DETECTED NOT DETECTED Final   Candida tropicalis NOT DETECTED NOT DETECTED Final  Urine culture     Status: Abnormal   Collection Time: 11/12/17  4:10 PM  Result Value Ref Range Status   Specimen Description   Final    URINE, CLEAN CATCH Performed at [facility], [address] [place], [place] [address]    Special Requests   Final    NONE Performed at [facility], [address], [place], [place] [address]    Culture >=100,000 COLONIES/mL ESCHERICHIA COLI (A)  Final   Report Status 11/14/2017 FINAL  Final   Organism ID, Bacteria ESCHERICHIA COLI (A)  Final      Susceptibility   Escherichia coli - MIC*    AMPICILLIN <=2 SENSITIVE Sensitive     CEFAZOLIN <=4 SENSITIVE Sensitive     CEFTRIAXONE <=1 SENSITIVE Sensitive     CIPROFLOXACIN <=0.25 SENSITIVE Sensitive     GENTAMICIN <=1 SENSITIVE Sensitive     IMIPENEM <=0.25 SENSITIVE Sensitive     NITROFURANTOIN <=16 SENSITIVE Sensitive     TRIMETH/SULFA <=20 SENSITIVE Sensitive     AMPICILLIN/SULBACTAM <=2 SENSITIVE Sensitive     PIP/TAZO <=4 SENSITIVE Sensitive     Extended ESBL NEGATIVE Sensitive     * >=100,000 COLONIES/mL ESCHERICHIA COLI  MRSA PCR Screening     Status: None   Collection Time: 11/13/17  3:50 AM  Result Value Ref Range Status   MRSA by PCR NEGATIVE NEGATIVE Final    Comment:        The GeneXpert MRSA Assay (FDA approved for NASAL specimens only), is one component of a comprehensive MRSA colonization surveillance program. It is not intended to diagnose MRSA infection nor to guide or monitor treatment for MRSA infections. Performed at [facility], [address], [place], [place] [address]   Aerobic/Anaerobic Culture (surgical/deep wound)     Status:  Abnormal (Preliminary result)   Collection Time: 11/13/17  4:53 PM  Result Value Ref Range Status   Specimen Description   Final    ABSCESS BACK Performed at [facility], [address] [place], [place] [address]    Special Requests   Final    NONE Performed at [facility], [address] [place], [place] [address]    Gram Stain   Final    ABUNDANT WBC PRESENT, PREDOMINANTLY PMN MODERATE GRAM NEGATIVE RODS Performed at [facility], [address], [place], [place] [address]    Culture (A)  Final    MULTIPLE ORGANISMS PRESENT, NONE PREDOMINANT NO ANAEROBES ISOLATED; CULTURE IN PROGRESS FOR 5 DAYS    Report Status PENDING  Incomplete  Body fluid culture     Status: None (Preliminary result)   Collection Time: 11/15/17  2:32 PM  Result Value Ref Range Status   Specimen Description   Final    ABDOMEN Performed at [facility], [address] [place], [place] [address]    Special Requests   Final    Normal Performed at [facility], [address], [place], [place] [address]    Gram Stain   Final    ABUNDANT WBC PRESENT, PREDOMINANTLY PMN MODERATE GRAM NEGATIVE RODS    Culture   Final    CULTURE REINCUBATED FOR BETTER GROWTH Performed at [facility], [address] [place], [place]  [id number]    Report Status PENDING  Incomplete    Radiology Reports Dg Chest 2 View  Result Date: 11/12/2017 CLINICAL DATA:  Night sweats for 2 weeks. History of diabetes and hypertension. EXAM: CHEST - 2 VIEW COMPARISON:  Chest radiograph July 07, 2017 FINDINGS: Cardiac silhouette is mildly enlarged. Improved aeration lungs, however there is persistent bronchial wall thickening with bandlike density RIGHT lung base. Mildly elevated RIGHT hemidiaphragm. No pleural effusion. No pneumothorax. Soft tissue planes and included osseous structures are non suspicious. IMPRESSION: Bronchitic changes with RIGHT  lung base atelectasis, less likely pneumonia. Stable cardiomegaly. Electronically Signed   By: Awilda Metroourtnay  Bloomer M.D.   On: 11/12/2017 17:12   Ct Abdomen Pelvis W Contrast  Result Date: 11/12/2017 CLINICAL DATA:  Right upper quadrant pain x2 weeks, melena, history of renal stones. EXAM: CT ABDOMEN AND PELVIS WITH CONTRAST TECHNIQUE: Multidetector CT imaging of the abdomen and pelvis was performed using the standard protocol following bolus administration of intravenous contrast. CONTRAST:  125mL ISOVUE-300 IOPAMIDOL (ISOVUE-300) INJECTION 61% COMPARISON:  06/27/2017 FINDINGS: Lower chest: Lung bases are clear. Hepatobiliary: Nodular hepatic contour, suggesting cirrhosis. No focal hepatic lesion is seen. Layering 6 mm gallstone (series 2/image 42), without associated inflammatory changes. No intrahepatic or extrahepatic ductal dilatation. Pancreas: Within normal limits. Spleen: Spleen is normal in size. Adrenals/Urinary Tract: 1.7 cm right adrenal nodule (series 2/image 26; sagittal image 82), indeterminate, although favored to be present in 2013, suggesting a benign adrenal adenoma. Left adrenal gland is within normal limits. Multiple bilateral renal cysts, measuring up to 5.2 cm in the anterior interpolar right kidney (series 2/image 36). No hydronephrosis. Abnormal soft tissue/inflammation with heterogeneous enhancement of the right lower kidney (series 2/image 44). Secondary involvement of the right psoas muscle with associated centrally necrotic 3.8 x 5.7 x 6.6 cm lesion in the right psoas (series 2/image 47). Associated fluid/gas in the right retroperitoneum/posterior pararenal space, measuring up to 5.9 x 4.9 x 6.0 cm (series 2/image 54), although this does not reflect a well-defined fluid collection. Overall, given this constellation of findings, this appearance favors pyelonephritis with associated psoas abscess and gas-forming infection extending into the retroperitoneum/posterior pararenal space.  Technically speaking, some of this abnormal soft tissue and the right psoas abnormality could reflect tumor, but this would not account for the associated soft tissue inflammation/gas. Bladder is within normal limits. Stomach/Bowel: Stomach is within normal limits. No evidence of bowel obstruction. Normal appendix (series 2/image 62). Notably, the ascending colon/cecum and appendix are remote from the fluid/gas in the posterior pararenal space. Vascular/Lymphatic: No evidence of abdominal aortic aneurysm. Atherosclerotic calcifications of the abdominal aorta and branch vessels. No suspicious abdominopelvic lymphadenopathy. Reproductive: Uterus is within normal limits. Bilateral ovaries are within normal limits. Other: No abdominopelvic ascites. Musculoskeletal: Right psoas lesion, as described above. Visualized osseous structures are within normal limits. IMPRESSION: Abnormal soft tissue/inflammation with heterogeneous enhancement of the right lower kidney, favoring pyelonephritis, with associated psoas abscess and gas-forming infection extending into the right retroperitoneum/posterior pararenal space. Suspected psoas abscess currently measures up to 6.6 cm. Ill-defined fluid/gas in the right posterior pararenal space measures to 6.0 cm, but this does not reflect a well-defined fluid collection. Additional ancillary findings as above, including cirrhosis, cholelithiasis, and bilateral renal cysts. These results were called by telephone at the time of interpretation on 11/12/2017 at 11:39 am to [facility]ELLE REDMON , who verbally acknowledged these results. Electronically Signed   By: Charline BillsSriyesh  Krishnan M.D.   On: 11/12/2017 11:41   Dg Abd Acute W/chest  Result Date: 11/14/2017 CLINICAL DATA:  Nausea. Incision site pain. Pain at the incision site. EXAM: DG ABDOMEN ACUTE W/ 1V CHEST COMPARISON:  CT abdomen pelvis 11/12/2017. FINDINGS: Monitoring leads overlie the patient. Stable cardiac and mediastinal contours. Right  basilar heterogeneous opacities. No pleural effusion or pneumothorax. Pigtail drainage catheter projects over the right hemiabdomen. Gas is demonstrated within nondilated loops of large and small bowel in a nonobstructed pattern. No free intraperitoneal air. Unremarkable osseous skeleton. IMPRESSION: Right basilar opacities favored to represent atelectasis. Pigtail drainage catheter projects over the right hemiabdomen. Nonobstructed bowel gas pattern. Electronically Signed   By: Annia Belt M.D.   On: 11/14/2017 20:11   Ct Image Guided Drainage By Percutaneous Catheter  Result Date: 11/13/2017 CLINICAL DATA:  Right psoas abscess EXAM: CT GUIDED DRAINAGE OF RIGHT PSOAS ABSCESS ANESTHESIA/SEDATION: Intravenous Fentanyl and Versed were administered as conscious sedation during continuous monitoring of the patient's level of consciousness and physiological / cardiorespiratory status by the radiology RN, with a total moderate sedation time of 14 minutes. PROCEDURE: The procedure, risks, benefits, and alternatives were explained to the patient. Questions regarding the procedure were encouraged and answered. The patient understands and consents to the procedure. Patient placed in left lateral decubitus position. Limited axial scans through the abdomen were obtained. The right psoas collection was localized and an appropriate skin entry site was determined and marked. The operative field was prepped with chlorhexidinein a sterile fashion, and a sterile drape was applied covering the operative field. A sterile gown and sterile gloves were used for the procedure. Local anesthesia was provided with 1% Lidocaine. Under CT fluoroscopic guidance, an 18 gauge trocar needle was advanced into the collection using a right paraspinal approach. Once needle tip was seen in the collection, aspiration VL purulent material. An Amplatz wire advanced easily, its position confirmed on CT. Tract dilated to facilitate placement of a 14  French 12 French pigtail drain catheter, formed centrally within the collection. CT confirmed appropriate drain catheter position. 20 mL of blood-tinged purulent aspirate were sent for Gram stain and culture. The catheter was flushed and placed to external gravity drain bag. The catheter was secured externally with 0 Prolene suture and StatLock. The patient tolerated the procedure well. COMPLICATIONS: None immediate FINDINGS: The right psoas abscess was again localized. 12 French abscess drain catheter placed. Sample of the aspirate sent for Gram stain and culture. IMPRESSION: 1. Technically successful CT-guided right psoas abscess drain catheter placement Electronically Signed   By: Corlis Leak M.D.   On: 11/13/2017 17:44     CBC Recent Labs  Lab 11/12/17 1252 11/13/17 0336 11/13/17 0451 11/14/17 0310 11/15/17 0435  WBC 34.1* 35.3* 32.3* 28.3* 23.7*  HGB 12.7 11.6* 10.7* 10.8* 10.7*  HCT 37.0 34.0* 31.7* 32.2* 31.8*  PLT 297 251 253 253 254  MCV 86.7 87.9 87.3 88.2 88.1  MCH 29.7 30.0 29.5 29.6 29.6  MCHC 34.3 34.1 33.8 33.5 33.6  RDW 14.5 14.8 14.7 14.7 14.7    Chemistries  Recent Labs  Lab 11/12/17 1347 11/13/17 0336 11/13/17 0451 11/14/17 0310 11/15/17 0435  NA 132* 133* 137 134* 137  K 5.7* 4.6 4.6 4.7 4.5  CL 98* 104 107 107 109  CO2 25 22 24  21* 23  GLUCOSE 520* 428* 424* 455* 302*  BUN 23* 25* 24* 24* 22*  CREATININE 1.39* 1.31* 1.35* 1.26* 1.06*  CALCIUM 9.1 8.3* 8.6* 8.1* 8.6*  AST 41 25  --   --   --   ALT 33  27  --   --   --   ALKPHOS 191* 159*  --   --   --   BILITOT 1.5* 0.9  --   --   --    ------------------------------------------------------------------------------------------------------------------ No results for input(s): CHOL, HDL, LDLCALC, TRIG, CHOLHDL, LDLDIRECT in the last 72 hours.  Lab Results  Component Value Date   HGBA1C 12.0 (H) 11/12/2017    ------------------------------------------------------------------------------------------------------------------ No results for input(s): TSH, T4TOTAL, T3FREE, THYROIDAB in the last 72 hours.  Invalid input(s): FREET3 ------------------------------------------------------------------------------------------------------------------ No results for input(s): VITAMINB12, FOLATE, FERRITIN, TIBC, IRON, RETICCTPCT in the last 72 hours.  Coagulation profile Recent Labs  Lab 11/12/17 1347  INR 1.27    No results for input(s): DDIMER in the last 72 hours.  Cardiac Enzymes No results for input(s): CKMB, TROPONINI, MYOGLOBIN in the last 168 hours.  Invalid input(s): CK ------------------------------------------------------------------------------------------------------------------    Component Value Date/Time   BNP 82.4 06/27/2017 1003     Lelynd Poer M.D on 11/16/2017 at 2:28 PM  Between 7am to 7pm - Pager - [phone]  After 7pm go to [email or web address] - password [email or web address]  [facility] -  Office  [phone]   Voice Recognition Reubin Milan dictation system was used to create this note, attempts have been made to correct errors. Please contact the author with questions and/or clarifications.

## 2017-11-17 LAB — GLUCOSE, CAPILLARY
GLUCOSE-CAPILLARY: 303 mg/dL — AB (ref 65–99)
Glucose-Capillary: 187 mg/dL — ABNORMAL HIGH (ref 65–99)

## 2017-11-17 LAB — COMPREHENSIVE METABOLIC PANEL WITH GFR
ALT: 27 U/L (ref 14–54)
AST: 26 U/L (ref 15–41)
Albumin: 1.7 g/dL — ABNORMAL LOW (ref 3.5–5.0)
Alkaline Phosphatase: 171 U/L — ABNORMAL HIGH (ref 38–126)
Anion gap: 3 — ABNORMAL LOW (ref 5–15)
BUN: 22 mg/dL — ABNORMAL HIGH (ref 6–20)
CO2: 25 mmol/L (ref 22–32)
Calcium: 8.5 mg/dL — ABNORMAL LOW (ref 8.9–10.3)
Chloride: 106 mmol/L (ref 101–111)
Creatinine, Ser: 1.07 mg/dL — ABNORMAL HIGH (ref 0.44–1.00)
GFR calc Af Amer: >60 mL/min
GFR calc non Af Amer: 55 mL/min — ABNORMAL LOW
Glucose, Bld: 206 mg/dL — ABNORMAL HIGH (ref 65–99)
Potassium: 3.9 mmol/L (ref 3.5–5.1)
Sodium: 134 mmol/L — ABNORMAL LOW (ref 135–145)
Total Bilirubin: 0.6 mg/dL (ref 0.3–1.2)
Total Protein: 7.1 g/dL (ref 6.5–8.1)

## 2017-11-17 LAB — CBC
HCT: 32.2 % — ABNORMAL LOW (ref 36.0–46.0)
Hemoglobin: 11 g/dL — ABNORMAL LOW (ref 12.0–15.0)
MCH: 29.5 pg (ref 26.0–34.0)
MCHC: 34.2 g/dL (ref 30.0–36.0)
MCV: 86.3 fL (ref 78.0–100.0)
Platelets: 264 K/uL (ref 150–400)
RBC: 3.73 MIL/uL — ABNORMAL LOW (ref 3.87–5.11)
RDW: 14.7 % (ref 11.5–15.5)
WBC: 21.3 K/uL — ABNORMAL HIGH (ref 4.0–10.5)

## 2017-11-17 LAB — CULTURE, BLOOD (ROUTINE X 2)
CULTURE: NO GROWTH
Special Requests: ADEQUATE

## 2017-11-17 MED ORDER — CARVEDILOL 12.5 MG PO TABS: 12.5000 mg | ORAL_TABLET | Freq: Two times a day (BID) | ORAL | 2 refills | Status: DC

## 2017-11-17 MED ORDER — SENNOSIDES-DOCUSATE SODIUM 8.6-50 MG PO TABS: 2.0000 | ORAL_TABLET | Freq: Two times a day (BID) | ORAL | 1 refills | Status: AC

## 2017-11-17 MED ORDER — LACTINEX PO CHEW: 1.0000 | CHEWABLE_TABLET | Freq: Three times a day (TID) | ORAL | 1 refills | Status: DC

## 2017-11-17 MED ORDER — LACTULOSE 10 GM/15ML PO SOLN
30.0000 g | Freq: Once | ORAL | Status: AC
  Administered 2017-11-17: 30 g via ORAL
  Filled 2017-11-17: qty 60

## 2017-11-17 MED ORDER — OXYCODONE HCL 5 MG PO TABS: 5.0000 mg | ORAL_TABLET | ORAL | 0 refills | Status: DC | PRN

## 2017-11-17 MED ORDER — ACETAMINOPHEN 325 MG PO TABS: 650.0000 mg | ORAL_TABLET | Freq: Four times a day (QID) | ORAL | 0 refills | Status: DC | PRN

## 2017-11-17 MED ORDER — POLYETHYLENE GLYCOL 3350 17 G PO PACK
17.0000 g | PACK | Freq: Every day | ORAL | Status: DC
  Administered 2017-11-17: 17 g via ORAL
  Filled 2017-11-17: qty 1

## 2017-11-17 MED ORDER — AMLODIPINE BESYLATE 10 MG PO TABS: 10.0000 mg | ORAL_TABLET | Freq: Every day | ORAL | 1 refills | Status: DC

## 2017-11-17 MED ORDER — POLYETHYLENE GLYCOL 3350 17 G PO PACK: 17.0000 g | PACK | Freq: Every day | ORAL | 0 refills | Status: DC

## 2017-11-17 MED ORDER — INSULIN GLARGINE 100 UNIT/ML ~~LOC~~ SOLN: SUBCUTANEOUS | 2 refills | Status: DC

## 2017-11-17 MED ORDER — BISACODYL 10 MG RE SUPP
10.0000 mg | Freq: Once | RECTAL | Status: DC
  Filled 2017-11-17: qty 1

## 2017-11-17 MED ORDER — PREDNISONE 10 MG PO TABS: ORAL_TABLET | ORAL | 1 refills | Status: DC

## 2017-11-17 MED ORDER — ALBUTEROL SULFATE HFA 108 (90 BASE) MCG/ACT IN AERS: 2.0000 | INHALATION_SPRAY | RESPIRATORY_TRACT | 2 refills | Status: DC | PRN

## 2017-11-17 MED ORDER — CEFDINIR 300 MG PO CAPS: 300.0000 mg | ORAL_CAPSULE | Freq: Two times a day (BID) | ORAL | 0 refills | Status: AC

## 2017-11-17 NOTE — Discharge Summary (Addendum)
Dana LamerGloria W Slates, is a 62 y.o. female  DOB 02-16-1956  MRN [id number].  Admission date:  11/12/2017  Admitting Physician  Esperanza SheetsUlugbek N Buriev, MD  Discharge Date:  11/17/2017   Primary MD  Maurice SmallGriffin, Elaine, MD  Recommendations for primary care physician for things to follow:   1)Rt Flank drain Tube/catheter-  as outpatient/at home recommend once daily irrigation of drain with 5-10 cc sterile normal saline solution, output recording and dressing changes every 1-2 days.  She can be scheduled for follow-up in IR drain clinic [phone]) within 1 week post discharge (You have appointment for 11/20/17, you can call [phone] to change date and time of appointment, However You need to be seen no later than 11/22/17) . Any drain related questions as OP can be answered by calling the above number or [phone] and asking for IR radiologist on call.  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 Hypercholesterolemia   . Hypertension   . Kidney stones   . Type II diabetes mellitus (HCC)     Past Surgical History:  Procedure Laterality Date  . BREAST EXCISIONAL  BIOPSY    . CESAREAN SECTION  1999  . DILATION AND CURETTAGE OF UTERUS     "I lost a baby"  . TUBAL LIGATION  1999     HPI  from the history and physical done on the day of admission:     Chief Complaint: Right flank pain   HPI: Dana Thornton is a 62 y.o. female with PMH of HTN, DM, Nephrolithiasis, Autoimmune hepatitis (on prednisone), CKD, E coli-UTI and Sepsis (06/2017) presented with progressively worsening right flank pain. Patient states that she has developed progressively worsening right flank pain with mild radiation to her right leg for two weeks. She reports that she was unable to walk well and went to see her primary care physician. Ct san was obtained today which showed right pyelonephritis and psoas abscess. She reports associated night sweats, nausea, few episodes of vomiting. She denies acute abdominal pains, no hematemesis, no hematochezia. She denies dysuria, no change in urine color, no hematuria. She reports recent upper respiratory symptoms with rhinorrhea, no acute shortness of breath, no chest pains. She denies focal neuro symptoms, but she has chronic left leg numbness. She reports chronic steroid use for autoimmune hepatitis,  recently  restarted on 10 gm daily.  -ED: leukocytosis. CT showed right pyelo with psoas abscess. ED d/w urology who recommended IR drainage. hospitalist is called for admission    Review of Systems: The patient denies anorexia, fever, weight loss,, vision loss, decreased hearing, hoarseness, chest pain, syncope, dyspnea on exertion, peripheral edema, balance deficits, hemoptysis, abdominal pain, melena, hematochezia, severe indigestion/heartburn, hematuria, incontinence, genital sores, muscle weakness, suspicious skin lesions, transient blindness, difficulty walking, depression, unusual weight change, abnormal bleeding, enlarged lymph nodes, angioedema, and breast masses.     Hospital Course:    Brief summary  62 y.o.femalewith PMH of  HTN, DM, Nephrolithiasis, Autoimmune hepatitis (on prednisone), CKD, E coli-UTI and Sepsis (06/2017) presented with progressively worsening right flank pain, found to have right-sided pyelonephritis and psoas abscess status post IR drainage on 11/13/2017   Assessment/Plan:  1)E Coli Bacteremia/ E coli Psoas Abscess/Acute UTI/Right Pyelonephritis-  S/p Rt psoas abscess drain 11/13/17, right flank pain is better,  afebrile;  drain fluid cx from 11/13/17   without "definite" growth, however drain fluid cx from 11/15/17 with pansensitive E. coli, urine and blood cultures from 11/12/2017 also with pansensitive E. coli .  Interventional Radiology and urology input appreciated-treated with vanc/cefepime since 11/12/17,  Received Rocephin 2 gm iv x 1 on 11/17/17, plan to discharge on Omnicef 300 mg twice daily for additional 12 days due to bacteremia component, pt has persistent leukocytosis due to steroids (WBC is down to 21 k from peak of 35 k)  2)DM-  hyperglycemia should improve once stress of infection resolves and steroids are tapered, increase Lantus  To 30 u PM,  And 12 U qAM.     3)HTN. -Improved on amlodipine 10 mg daily , and coreg 6.25 mg bid,  losartan ,    4)Autoimmune hepatitis. Patient is on chronic prednisone, and mercaptopurine.    Patient was initially on Solu-Cortef, taper prednisone, follow-up with GI specialist  5)ID-   blood cultures from 11/12/2017 and urine culture from 11/12/2017 with pansensitive E. Coli, wound/surgical cultures from 11/13/2017 without "definite" growth at this time, blood cultures from 11/12/2017 with coagulase-negative staph in 1 out of 2 bottles recent concerns about possible contaminant .    Drain/body fluid culture from 11/15/2017 with pansensitive E. coli, treated with vanc/cefepime since 11/12/17,  Received Rocephin 2 gm iv x 1 on 11/17/17, plan to discharge on Omnicef 300 mg twice daily for additional 12 days due to bacteremia component, pt has persistent leukocytosis due to  steroids,   6)AKI-now resolved, creatinine peaked at 1.39, GFR was down to 46, creatinine is back to 1.0 at this time with a GFR over 60  7)Disposition- drain fluid cx- mult org;continue drain for now;as outpatient recommend once daily irrigation of drain with 5-10 cc sterile normal saline, output recording and dressing changes every 1-2 days. She can be scheduled for follow-up in IR drain clinic [phone])within 1 week post discharge.Anydrain related questions as OPcan be answered by calling the above number or [phone] and askingfor IR radiologist on call.   Code Status:full Family Communication:d/w patient, SON AND daughter, Husband at bedside Disposition Plan: home with family  Consultants:  Urology   IR  Procedures:  Pend IR procedure  Antibiotics:  Cefepime 3/5>>>  vanc 3/5>>>> Rocephin 11/17/17  Discharge Condition: stable  Follow UP- IR on or before 11/22/17   Diet and Activity recommendation:  As advised  Discharge Instructions     Discharge Instructions    Call MD for:  persistant dizziness or  light-headedness   Complete by:  As directed    Call MD for:  persistant nausea and vomiting   Complete by:  As directed    Call MD for:  redness, tenderness, or signs of infection (pain, swelling, redness, odor or green/yellow discharge around incision site)   Complete by:  As directed    Call MD for:  severe uncontrolled pain   Complete by:  As directed    Call MD for:  temperature >100.4   Complete by:  As directed    Diet - low sodium heart healthy   Complete by:  As directed    Diet Carb Modified   Complete by:  As directed    Discharge instructions   Complete by:  As directed    1)Rt Flank drain Tube/catheter-  as outpatient/at home recommend once daily irrigation of drain with 5-10 cc sterile normal saline solution, output recording and dressing changes every 1-2 days.  She can be scheduled for follow-up in IR drain clinic  [phone]) within 1 week post discharge (You have appointment for 11/20/17, you can call [phone] to change date and time of appointment, However You need to be seen no later than 11/22/17) . Any drain related questions as OP can be answered by calling the above number or [phone] and asking for IR radiologist on call.  2) Take medications as prescribed  3) increase ambulation advised  4)Avoid constipation   Increase activity slowly   Complete by:  As directed         Discharge Medications     Allergies as of 11/17/2017      Reactions   Codeine Nausea And Vomiting      Medication List    STOP taking these medications   cefUROXime 500 MG tablet Commonly known as:  CEFTIN   LEVEMIR FLEXPEN [place]   naproxen sodium 220 MG tablet Commonly known as:  ALEVE     TAKE these medications   acetaminophen 325 MG tablet Commonly known as:  TYLENOL Take 2 tablets (650 mg total) by mouth every 6 (six) hours as needed for mild pain, moderate pain, fever or headache.   albuterol 108 (90 Base) MCG/ACT inhaler Commonly known as:  PROVENTIL HFA;VENTOLIN HFA Inhale 2 puffs into the lungs every 4 (four) hours as needed for wheezing or shortness of breath. What changed:  when to take this   amLODipine 10 MG tablet Commonly known as:  NORVASC Take 1 tablet (10 mg total) by mouth daily.   BREATHERITE COLL SPACER ADULT Misc 1 Device by Does not apply route every 6 (six) hours as needed.   carvedilol 12.5 MG tablet Commonly known as:  COREG Take 1 tablet (12.5 mg total) by mouth 2 (two) times daily with a meal. What changed:    medication strength  how much to take   cefdinir 300 MG capsule Commonly known as:  OMNICEF Take 1 capsule (300 mg total) by mouth 2 (two) times daily for 12 days.   ezetimibe 10 MG tablet Commonly known as:  ZETIA Take 10 mg by mouth daily.   insulin aspart 100 UNIT/ML injection Commonly known as:  novoLOG Before each meal 3 times a day, 120-150  - 2 units, 151-200 - 4 units, 201-250 - 8 units, 251-300 - 10 units,  301 or above 10 units and call your MD What changed:    how much to take  how to take this  when to take this  additional instructions   insulin  glargine 100 UNIT/ML injection Commonly known as:  LANTUS 30  Units sq Qhs and 12 units q am What changed:  additional instructions   lactobacillus acidophilus & bulgar chewable tablet Chew 1 tablet by mouth 3 (three) times daily with meals.   levothyroxine 150 MCG tablet Commonly known as:  SYNTHROID, LEVOTHROID Take 1 tablet (150 mcg total) by mouth daily before breakfast. What changed:  how much to take   losartan 100 MG tablet Commonly known as:  COZAAR Take 100 mg by mouth daily.   mercaptopurine 50 MG tablet Commonly known as:  PURINETHOL Take 150 mg by mouth daily. Give on an empty stomach 1 hour before or 2 hours after meals. Caution: Chemotherapy.   oxyCODONE 5 MG immediate release tablet Commonly known as:  ROXICODONE Take 1 tablet (5 mg total) by mouth every 4 (four) hours as needed for severe pain.   polyethylene glycol packet Commonly known as:  MIRALAX / GLYCOLAX Take 17 g by mouth daily. As needed for constipation Start taking on:  11/18/2017   predniSONE 10 MG tablet Commonly known as:  DELTASONE Take 2 tablets (20 mg) daily with food for 4 days, then 1 tablet (10 mg) daily with food until seen by your GI doctor What changed:    medication strength  how much to take  how to take this  when to take this  additional instructions   senna-docusate 8.6-50 MG tablet Commonly known as:  Senokot-S Take 2 tablets by mouth 2 (two) times daily.   terconazole 0.4 % vaginal cream Commonly known as:  TERAZOL 7 Place 1 applicator vaginally daily.   Vitamin D (Ergocalciferol) 50000 units Caps capsule Commonly known as:  DRISDOL Take 50,000 Units by mouth every 7 (seven) days.       Major procedures and Radiology Reports - PLEASE review  detailed and final reports for all details, in brief -    Dg Chest 2 View  Result Date: 11/12/2017 CLINICAL DATA:  Night sweats for 2 weeks. History of diabetes and hypertension. EXAM: CHEST - 2 VIEW COMPARISON:  Chest radiograph July 07, 2017 FINDINGS: Cardiac silhouette is mildly enlarged. Improved aeration lungs, however there is persistent bronchial wall thickening with bandlike density RIGHT lung base. Mildly elevated RIGHT hemidiaphragm. No pleural effusion. No pneumothorax. Soft tissue planes and included osseous structures are non suspicious. IMPRESSION: Bronchitic changes with RIGHT lung base atelectasis, less likely pneumonia. Stable cardiomegaly. Electronically Signed   By: Awilda Metro M.D.   On: 11/12/2017 17:12   Ct Abdomen Pelvis W Contrast  Result Date: 11/12/2017 CLINICAL DATA:  Right upper quadrant pain x2 weeks, melena, history of renal stones. EXAM: CT ABDOMEN AND PELVIS WITH CONTRAST TECHNIQUE: Multidetector CT imaging of the abdomen and pelvis was performed using the standard protocol following bolus administration of intravenous contrast. CONTRAST:  ISOVUE-300 IOPAMIDOL (ISOVUE-300) INJECTION 61% COMPARISON:  06/27/2017 FINDINGS: Lower chest: Lung bases are clear. Hepatobiliary: Nodular hepatic contour, suggesting cirrhosis. No focal hepatic lesion is seen. Layering 6 mm gallstone (series 2/image 42), without associated inflammatory changes. No intrahepatic or extrahepatic ductal dilatation. Pancreas: Within normal limits. Spleen: Spleen is normal in size. Adrenals/Urinary Tract: 1.7 cm right adrenal nodule (series 2/image 26; sagittal image 82), indeterminate, although favored to be present in 2013, suggesting a benign adrenal adenoma. Left adrenal gland is within normal limits. Multiple bilateral renal cysts, measuring up to 5.2 cm in the anterior interpolar right kidney (series 2/image 36). No hydronephrosis. Abnormal soft tissue/inflammation with heterogeneous  enhancement  of the right lower kidney (series 2/image 44). Secondary involvement of the right psoas muscle with associated centrally necrotic 3.8 x 5.7 x 6.6 cm lesion in the right psoas (series 2/image 47). Associated fluid/gas in the right retroperitoneum/posterior pararenal space, measuring up to 5.9 x 4.9 x 6.0 cm (series 2/image 54), although this does not reflect a well-defined fluid collection. Overall, given this constellation of findings, this appearance favors pyelonephritis with associated psoas abscess and gas-forming infection extending into the retroperitoneum/posterior pararenal space. Technically speaking, some of this abnormal soft tissue and the right psoas abnormality could reflect tumor, but this would not account for the associated soft tissue inflammation/gas. Bladder is within normal limits. Stomach/Bowel: Stomach is within normal limits. No evidence of bowel obstruction. Normal appendix (series 2/image 62). Notably, the ascending colon/cecum and appendix are remote from the fluid/gas in the posterior pararenal space. Vascular/Lymphatic: No evidence of abdominal aortic aneurysm. Atherosclerotic calcifications of the abdominal aorta and branch vessels. No suspicious abdominopelvic lymphadenopathy. Reproductive: Uterus is within normal limits. Bilateral ovaries are within normal limits. Other: No abdominopelvic ascites. Musculoskeletal: Right psoas lesion, as described above. Visualized osseous structures are within normal limits. IMPRESSION: Abnormal soft tissue/inflammation with heterogeneous enhancement of the right lower kidney, favoring pyelonephritis, with associated psoas abscess and gas-forming infection extending into the right retroperitoneum/posterior pararenal space. Suspected psoas abscess currently measures up to 6.6 cm. Ill-defined fluid/gas in the right posterior pararenal space measures to 6.0 cm, but this does not reflect a well-defined fluid collection. Additional ancillary  findings as above, including cirrhosis, cholelithiasis, and bilateral renal cysts. These results were called by telephone at the time of interpretation on 11/12/2017 at 11:39 am to [facility] , who verbally acknowledged these results. Electronically Signed   By: Charline Bills M.D.   On: 11/12/2017 11:41   Dg Abd Acute W/chest  Result Date: 11/14/2017 CLINICAL DATA:  Nausea. Incision site pain. Pain at the incision site. EXAM: DG ABDOMEN ACUTE W/ 1V CHEST COMPARISON:  CT abdomen pelvis 11/12/2017. FINDINGS: Monitoring leads overlie the patient. Stable cardiac and mediastinal contours. Right basilar heterogeneous opacities. No pleural effusion or pneumothorax. Pigtail drainage catheter projects over the right hemiabdomen. Gas is demonstrated within nondilated loops of large and small bowel in a nonobstructed pattern. No free intraperitoneal air. Unremarkable osseous skeleton. IMPRESSION: Right basilar opacities favored to represent atelectasis. Pigtail drainage catheter projects over the right hemiabdomen. Nonobstructed bowel gas pattern. Electronically Signed   By: Annia Belt M.D.   On: 11/14/2017 20:11   Ct Image Guided Drainage By Percutaneous Catheter  Result Date: 11/13/2017 CLINICAL DATA:  Right psoas abscess EXAM: CT GUIDED DRAINAGE OF RIGHT PSOAS ABSCESS ANESTHESIA/SEDATION: Intravenous Fentanyl and Versed were administered as conscious sedation during continuous monitoring of the patient's level of consciousness and physiological / cardiorespiratory status by the radiology RN, with a total moderate sedation time of 14 minutes. PROCEDURE: The procedure, risks, benefits, and alternatives were explained to the patient. Questions regarding the procedure were encouraged and answered. The patient understands and consents to the procedure. Patient placed in left lateral decubitus position. Limited axial scans through the abdomen were obtained. The right psoas collection was localized and an appropriate  skin entry site was determined and marked. The operative field was prepped with chlorhexidinein a sterile fashion, and a sterile drape was applied covering the operative field. A sterile gown and sterile gloves were used for the procedure. Local anesthesia was provided with 1% Lidocaine. Under CT fluoroscopic guidance, an 18 gauge trocar needle was  advanced into the collection using a right paraspinal approach. Once needle tip was seen in the collection, aspiration VL purulent material. An Amplatz wire advanced easily, its position confirmed on CT. Tract dilated to facilitate placement of a 14 French 12 French pigtail drain catheter, formed centrally within the collection. CT confirmed appropriate drain catheter position. 20 mL of blood-tinged purulent aspirate were sent for Gram stain and culture. The catheter was flushed and placed to external gravity drain bag. The catheter was secured externally with 0 Prolene suture and StatLock. The patient tolerated the procedure well. COMPLICATIONS: None immediate FINDINGS: The right psoas abscess was again localized. 12 French abscess drain catheter placed. Sample of the aspirate sent for Gram stain and culture. IMPRESSION: 1. Technically successful CT-guided right psoas abscess drain catheter placement Electronically Signed   By: Corlis Leak M.D.   On: 11/13/2017 17:44    Micro Results    Recent Results (from the past 240 hour(s))  Blood culture (routine x 2)     Status: None   Collection Time: 11/12/17  2:22 PM  Result Value Ref Range Status   Specimen Description   Final    BLOOD LEFT ANTECUBITAL Performed at [facility], [address], [place], [place] [address]    Special Requests   Final    BOTTLES DRAWN AEROBIC AND ANAEROBIC Blood Culture adequate volume Performed at [facility], [address], [place], [place] [address]    Culture   Final    NO GROWTH 5 DAYS Performed at [facility], [address] [place], [place] [address]    Report Status 11/17/2017 FINAL  Final  Blood culture (routine x 2)     Status: Abnormal   Collection Time: 11/12/17  2:49 PM  Result Value Ref Range Status   Specimen Description   Final    BLOOD RIGHT ANTECUBITAL Performed at [facility], [address] [place], [place] [address]    Special Requests   Final    BOTTLES DRAWN AEROBIC AND ANAEROBIC Blood Culture results may not be optimal due to an inadequate volume of blood received in culture bottles Performed at [facility], [address], [place], [place] [address]    Culture  Setup Time   Final    GRAM POSITIVE COCCI AEROBIC BOTTLE ONLY Organism ID to follow    Culture (A)  Final    STAPHYLOCOCCUS SPECIES (COAGULASE NEGATIVE) THE SIGNIFICANCE OF ISOLATING THIS ORGANISM FROM A SINGLE SET OF BLOOD CULTURES WHEN MULTIPLE SETS ARE DRAWN IS UNCERTAIN. PLEASE NOTIFY THE MICROBIOLOGY DEPARTMENT WITHIN ONE WEEK IF SPECIATION AND SENSITIVITIES ARE REQUIRED. Performed at [facility], [address] [place], [place] [address]    Report Status 11/15/2017 FINAL  Final  Blood Culture ID Panel (Reflexed)     Status: Abnormal   Collection Time: 11/12/17  2:49 PM  Result Value Ref Range Status   Enterococcus species NOT DETECTED NOT DETECTED Final   Listeria monocytogenes NOT DETECTED NOT DETECTED Final   Staphylococcus species DETECTED (A) NOT DETECTED Final    Comment: Methicillin (oxacillin) susceptible coagulase negative staphylococcus. Possible blood culture contaminant (unless isolated from more than one blood culture draw or clinical case suggests pathogenicity). No antibiotic treatment is indicated for blood  culture contaminants. CRITICAL RESULT CALLED TO, READ BACK BY AND VERIFIED WITH: Peggyann Juba PharmD 16:15 11/13/17 ([email or web address])    Staphylococcus aureus NOT DETECTED NOT DETECTED Final   Methicillin resistance NOT  DETECTED NOT DETECTED Final    Streptococcus species NOT DETECTED NOT DETECTED Final   Streptococcus agalactiae NOT DETECTED NOT DETECTED Final   Streptococcus pneumoniae NOT DETECTED NOT DETECTED Final   Streptococcus pyogenes NOT DETECTED NOT DETECTED Final   Acinetobacter baumannii NOT DETECTED NOT DETECTED Final   Enterobacteriaceae species NOT DETECTED NOT DETECTED Final   Enterobacter cloacae complex NOT DETECTED NOT DETECTED Final   Escherichia coli NOT DETECTED NOT DETECTED Final   Klebsiella oxytoca NOT DETECTED NOT DETECTED Final   Klebsiella pneumoniae NOT DETECTED NOT DETECTED Final   Proteus species NOT DETECTED NOT DETECTED Final   Serratia marcescens NOT DETECTED NOT DETECTED Final   Haemophilus influenzae NOT DETECTED NOT DETECTED Final   Neisseria meningitidis NOT DETECTED NOT DETECTED Final   Pseudomonas aeruginosa NOT DETECTED NOT DETECTED Final   Candida albicans NOT DETECTED NOT DETECTED Final   Candida glabrata NOT DETECTED NOT DETECTED Final   Candida krusei NOT DETECTED NOT DETECTED Final   Candida parapsilosis NOT DETECTED NOT DETECTED Final   Candida tropicalis NOT DETECTED NOT DETECTED Final  Urine culture     Status: Abnormal   Collection Time: 11/12/17  4:10 PM  Result Value Ref Range Status   Specimen Description   Final    URINE, CLEAN CATCH Performed at [facility], [address], [place], [place] [address]    Special Requests   Final    NONE Performed at [facility], [address] [place], [place] [address]    Culture >=100,000 COLONIES/mL ESCHERICHIA COLI (A)  Final   Report Status 11/14/2017 FINAL  Final   Organism ID, Bacteria ESCHERICHIA COLI (A)  Final      Susceptibility   Escherichia coli - MIC*    AMPICILLIN <=2 SENSITIVE Sensitive     CEFAZOLIN <=4 SENSITIVE Sensitive     CEFTRIAXONE <=1 SENSITIVE Sensitive     CIPROFLOXACIN <=0.25 SENSITIVE Sensitive     GENTAMICIN <=1 SENSITIVE Sensitive     IMIPENEM <=0.25 SENSITIVE  Sensitive     NITROFURANTOIN <=16 SENSITIVE Sensitive     TRIMETH/SULFA <=20 SENSITIVE Sensitive     AMPICILLIN/SULBACTAM <=2 SENSITIVE Sensitive     PIP/TAZO <=4 SENSITIVE Sensitive     Extended ESBL NEGATIVE Sensitive     * >=100,000 COLONIES/mL ESCHERICHIA COLI  MRSA PCR Screening     Status: None   Collection Time: 11/13/17  3:50 AM  Result Value Ref Range Status   MRSA by PCR NEGATIVE NEGATIVE Final    Comment:        The GeneXpert MRSA Assay (FDA approved for NASAL specimens only), is one component of a comprehensive MRSA colonization surveillance program. It is not intended to diagnose MRSA infection nor to guide or monitor treatment for MRSA infections. Performed at [facility], [address], [place], [place] [address]   Aerobic/Anaerobic Culture (surgical/deep wound)     Status: Abnormal (Preliminary result)   Collection Time: 11/13/17  4:53 PM  Result Value Ref Range Status   Specimen Description   Final    ABSCESS BACK Performed at [facility], [address] [place], [place] [address]    Special Requests   Final    NONE Performed at [facility], [address], [place], [place] [address]    Gram Stain   Final    ABUNDANT WBC PRESENT, PREDOMINANTLY PMN MODERATE GRAM NEGATIVE RODS Performed at [facility], [address], [place],  [place] [address]    Culture (A)  Final    MULTIPLE ORGANISMS PRESENT, NONE PREDOMINANT NO ANAEROBES ISOLATED; CULTURE IN PROGRESS FOR 5 DAYS    Report Status PENDING  Incomplete  Body fluid culture     Status: None (Preliminary result)   Collection Time: 11/15/17  2:32 PM  Result Value Ref Range Status   Specimen Description ABDOMEN  Final   Special Requests Normal  Final   Gram Stain   Final    ABUNDANT WBC PRESENT, PREDOMINANTLY PMN MODERATE GRAM NEGATIVE RODS    Culture FEW ESCHERICHIA COLI  Final   Report Status PENDING  Incomplete   Organism ID,  Bacteria ESCHERICHIA COLI  Final      Susceptibility   Escherichia coli - MIC*    AMPICILLIN 8 SENSITIVE Sensitive     CEFAZOLIN <=4 SENSITIVE Sensitive     CEFEPIME <=1 SENSITIVE Sensitive     CEFTAZIDIME <=1 SENSITIVE Sensitive     CEFTRIAXONE <=1 SENSITIVE Sensitive     CIPROFLOXACIN <=0.25 SENSITIVE Sensitive     GENTAMICIN <=1 SENSITIVE Sensitive     IMIPENEM <=0.25 SENSITIVE Sensitive     TRIMETH/SULFA <=20 SENSITIVE Sensitive     AMPICILLIN/SULBACTAM 4 SENSITIVE Sensitive     PIP/TAZO <=4 SENSITIVE Sensitive     Extended ESBL Value in next row Sensitive      NEGATIVEPerformed at [facility], [address], [place], [place] [address]    * FEW ESCHERICHIA COLI       Today   Subjective    Victorine Mcnee today has no new complaints, ambulatory with physical therapist, eating and drinking well, no fevers, no chills, no vomiting, right flank pain is better, husband at bedside, questions answered          Patient has been seen and examined prior to discharge   Objective   Blood pressure 131/79, pulse 92, temperature 97.9 F (36.6 C), temperature source Oral, resp. rate 18, height 5\' 2"  (1.575 m), weight 96.7 kg (213 lb 3 oz), SpO2 97 %.   Intake/Output Summary (Last 24 hours) at 11/17/2017 1455 Last data filed at 11/17/2017 0930 Gross per 24 hour  Intake 485 ml  Output -  Net 485 ml    Exam  Gen:- Awake Alert,  Resting with her kids at bedside HEENT:- [place].AT, No sclera icterus Neck-Supple Neck,No JVD,.  Lungs-  CTAB  CV- S1, S2 normal Abd-  +ve B.Sounds, Abd Soft, abdominal discomfort without rebound or guarding right flank with drainage tube in scant amount of drainage in the bag    Extremity/Skin:- No  edema,    Psych-affect is appropriate Neuro-no new focal deficits     Data Review   CBC w Diff:  Lab Results  Component Value Date   WBC 21.3 (H) 11/17/2017   HGB 11.0 (L) 11/17/2017   HCT 32.2 (L) 11/17/2017   PLT 264 11/17/2017   LYMPHOPCT  15 07/03/2017   MONOPCT 9 07/03/2017   EOSPCT 1 07/03/2017   BASOPCT 0 07/03/2017    CMP:  Lab Results  Component Value Date   NA 134 (L) 11/17/2017   K 3.9 11/17/2017   CL 106 11/17/2017   CO2 25 11/17/2017   BUN 22 (H) 11/17/2017   CREATININE 1.07 (H) 11/17/2017   PROT 7.1 11/17/2017   ALBUMIN 1.7 (L) 11/17/2017   BILITOT 0.6 11/17/2017   ALKPHOS 171 (H) 11/17/2017   AST 26 11/17/2017   ALT 27 11/17/2017  .   Total  Discharge time is about 33 minutes  Shon Hale M.D on 11/17/2017 at 2:55 PM  [facility]   Office  [phone]  Voice Recognition Reubin Milan dictation system was used to create this note, attempts have been made to correct errors. Please contact the author with questions and/or clarifications.

## 2017-11-17 NOTE — Progress Notes (Signed)
Explained to patient and patient's spouse about how to care for patient's drain at home.  RN did a demonstration and patient's spouse did a return demonstration on how to flush the drain and empty it without any difficulty. RN instructed them to keep a recording of patient's output from the drain daily and take to the follow up appointment. Patient's spouse felt comfortable and did not have any additional questions to ask the RN.  Supplies were given to the patient at discharge: drain dressings, tape, measuring cups and saline flushes.  Will notify MD of patient and patient's spouse of understanding of drain management at home.

## 2017-11-17 NOTE — Evaluation (Signed)
Physical Therapy Evaluation Patient Details Name: Dana Thornton MRN: [id number]    DOB: Jul 11, 1956 Today's Date: 11/17/2017   History of Present Illness     62 y.o.femalewith PMH of HTN, DM, Nephrolithiasis, Autoimmune hepatitis (on prednisone), CKD, E coli-UTI and Sepsis (06/2017) presented with progressively worsening right flank pain, found to have right-sided pyelonephritis and psoas abscess status post IR drainage on 11/13/2017    Clinical Impression  Pt admitted with above diagnosis. Pt is anticipating DC to home later today. Pts mobility is limited from baseline due to pain in RLE and is able to walk 200 feet with a RW. Will have family to assist at least until Tuesday at home. Discussed findings with pt and family. They are in agreement with no equipment needs or follow-up PT at this time. I have answered all patient's question regarding PT and mobility.   I have encouraged the patient to gradually increase activity daily to tolerance.        Follow Up Recommendations No PT follow up;Supervision for mobility/OOB    Equipment Recommendations  None recommended by PT    Recommendations for Other Services       Precautions / Restrictions Precautions Precautions: Fall Restrictions Weight Bearing Restrictions: No      Mobility  Bed Mobility Overal bed mobility: Needs Assistance Bed Mobility: Supine to Sit;Sit to Supine     Supine to sit: Min assist Sit to supine: Min assist   General bed mobility comments: Needs assist with RLE  Transfers Overall transfer level: Needs assistance Equipment used: Rolling walker (2 wheeled) Transfers: Sit to/from Stand Sit to Stand: Supervision            Ambulation/Gait Ambulation/Gait assistance: Supervision Ambulation Distance (Feet): 200 Feet Assistive device: Rolling walker (2 wheeled) Gait Pattern/deviations: Step-through pattern;Decreased stride length     General Gait Details: no loss of balance with gait. Pt is  using UE's to weightbear, likely due to RLE pain  Stairs            Wheelchair Mobility    Modified Rankin (Stroke Patients Only)       Balance                                             Pertinent Vitals/Pain Pain Assessment: 0-10 Pain Score: 7  Pain Location: Right Flank and right thigh Pain Descriptors / Indicators: Constant Pain Intervention(s): Limited activity within patient's tolerance;Monitored during session;Premedicated before session    Home Living Family/patient expects to be discharged to:: Private residence Living Arrangements: Children;Spouse/significant other Available Help at Discharge: Family;Available 24 hours/day(Daughter goes back to school on Tuesday.) Type of Home: House Home Access: Level entry     Home Layout: One level Home Equipment: Environmental consultant - 2 wheels      Prior Function Level of Independence: Independent               Hand Dominance   Dominant Hand: Right    Extremity/Trunk Assessment   Upper Extremity Assessment Upper Extremity Assessment: Defer to OT evaluation    Lower Extremity Assessment Lower Extremity Assessment: RLE deficits/detail RLE Deficits / Details: Pt with swelling in RLE which is likely causing pain and inhibiting strength RLE: Unable to fully assess due to pain    Cervical / Trunk Assessment Cervical / Trunk Assessment: Normal  Communication   Communication: No difficulties  Cognition Arousal/Alertness:  Awake/alert Behavior During Therapy: WFL for tasks assessed/performed Overall Cognitive Status: Within Functional Limits for tasks assessed                                        General Comments General comments (skin integrity, edema, etc.): Husband present for eval.      Exercises     Assessment/Plan    PT Assessment Patent does not need any further PT services  PT Problem List         PT Treatment Interventions      PT Goals (Current goals can be  found in the Care Plan section)       Frequency     Barriers to discharge        Co-evaluation               AM-PAC PT "6 Clicks" Daily Activity  Outcome Measure Difficulty turning over in bed (including adjusting bedclothes, sheets and blankets)?: A Lot Difficulty moving from lying on back to sitting on the side of the bed? : A Lot Difficulty sitting down on and standing up from a chair with arms (e.g., wheelchair, bedside commode, etc,.)?: A Little Help needed moving to and from a bed to chair (including a wheelchair)?: A Little Help needed walking in hospital room?: A Little Help needed climbing 3-5 steps with a railing? : A Lot 6 Click Score: 15    End of Session Equipment Utilized During Treatment: Gait belt Activity Tolerance: Patient tolerated treatment well Patient left: in bed;with call bell/phone within reach;with family/visitor present Nurse Communication: Mobility status PT Visit Diagnosis: Other abnormalities of gait and mobility (R26.89)    Time: [id number] PT Time Calculation (min) (ACUTE ONLY): 20 min   Charges:   PT Evaluation $PT Eval Low Complexity: 1 Low     PT G CodesLavona Mound:        Ayesha Markwell, PT  [phone] 11/17/2017   Donnella ShamSawulski, Ryelan Kazee J 11/17/2017, 12:11 PM

## 2017-11-17 NOTE — Discharge Instructions (Signed)
1)Rt Flank drain Tube/catheter-  as outpatient/at home recommend once daily irrigation of drain with 5-10 cc sterile normal saline solution, output recording and dressing changes every 1-2 days.  She can be scheduled for follow-up in IR drain clinic [phone]) within 1 week post discharge (You have appointment for 11/20/17, you can call [phone] to change date and time of appointment, However You need to be seen no later than 11/22/17) . Any drain related questions as OP can be answered by calling the above number or [phone] and asking for IR radiologist on call.  2) Take medications as prescribed  3) increase ambulation advised  4)Avoid constipation

## 2017-11-18 ENCOUNTER — Other Ambulatory Visit: Payer: Self-pay | Admitting: Urology

## 2017-11-18 DIAGNOSIS — K6812 Psoas muscle abscess: Secondary | ICD-10-CM

## 2017-11-18 LAB — AEROBIC/ANAEROBIC CULTURE W GRAM STAIN (SURGICAL/DEEP WOUND)

## 2017-11-18 LAB — BODY FLUID CULTURE: SPECIAL REQUESTS: NORMAL

## 2017-11-18 LAB — AEROBIC/ANAEROBIC CULTURE (SURGICAL/DEEP WOUND)

## 2017-11-21 ENCOUNTER — Encounter: Payer: Self-pay | Admitting: *Deleted

## 2017-11-21 ENCOUNTER — Ambulatory Visit
Admission: RE | Admit: 2017-11-21 | Discharge: 2017-11-21 | Disposition: A | Discharge status: Home / Self Care | Payer: 59 | Source: Ambulatory Visit | Attending: Family Medicine | Admitting: Family Medicine

## 2017-11-21 ENCOUNTER — Other Ambulatory Visit (HOSPITAL_COMMUNITY): Payer: Self-pay | Admitting: Interventional Radiology

## 2017-11-21 ENCOUNTER — Ambulatory Visit
Admission: RE | Admit: 2017-11-21 | Discharge: 2017-11-21 | Disposition: A | Discharge status: Home / Self Care | Payer: 59 | Source: Ambulatory Visit | Attending: Urology | Admitting: Urology

## 2017-11-21 ENCOUNTER — Other Ambulatory Visit: Payer: Self-pay | Admitting: Radiology

## 2017-11-21 DIAGNOSIS — K6812 Psoas muscle abscess: Secondary | ICD-10-CM

## 2017-11-21 HISTORY — PX: IR RADIOLOGIST EVAL & MGMT: IMG5224

## 2017-11-21 MED ORDER — IOPAMIDOL (ISOVUE-300) INJECTION 61%
100.0000 mL | Freq: Once | INTRAVENOUS | Status: AC | PRN
  Administered 2017-11-21: 125 mL via INTRAVENOUS

## 2017-11-21 NOTE — Progress Notes (Addendum)
Referring Physician(s): Emokpae,Courage DR Maurice Small  Chief Complaint: The patient is seen in follow up today s/p 11/13/17: Technically successful CT-guided right psoas abscess drain catheter placement   History of present illness:  Pt here today for evaluation and CT of existing Rt psoas abscess drain Doing well Denies fever/chills Eating well; does complain of constipation (secondary pain med)  Flushing drain 10 cc daily; OP 10-15 cc daily Still on antibiotics Follows with Dr Maurice Small as PCP  For CT today and possible removal of drain     Past Medical History:  Diagnosis Date  . Hypercholesterolemia   . Hypertension   . Kidney stones   . Type II diabetes mellitus (HCC)     Past Surgical History:  Procedure Laterality Date  . BREAST EXCISIONAL BIOPSY    . CESAREAN SECTION  1999  . DILATION AND CURETTAGE OF UTERUS     "I lost a baby"  . TUBAL LIGATION  1999    Allergies: Codeine  Medications: Prior to Admission medications   Medication Sig Start Date End Date Taking? Authorizing Provider  acetaminophen (TYLENOL) 325 MG tablet Take 2 tablets (650 mg total) by mouth every 6 (six) hours as needed for mild pain, moderate pain, fever or headache. 11/17/17   Mariea Clonts, Courage, MD  albuterol (PROVENTIL HFA;VENTOLIN HFA) 108 (90 Base) MCG/ACT inhaler Inhale 2 puffs into the lungs every 4 (four) hours as needed for wheezing or shortness of breath. 11/17/17   Shon Hale, MD  amLODipine (NORVASC) 10 MG tablet Take 1 tablet (10 mg total) by mouth daily. 11/17/17   Shon Hale, MD  carvedilol (COREG) 12.5 MG tablet Take 1 tablet (12.5 mg total) by mouth 2 (two) times daily with a meal. 11/17/17   Emokpae, Courage, MD  cefdinir (OMNICEF) 300 MG capsule Take 1 capsule (300 mg total) by mouth 2 (two) times daily for 12 days. 11/17/17 11/29/17  Shon Hale, MD  ezetimibe (ZETIA) 10 MG tablet Take 10 mg by mouth daily.    [provider]  insulin  aspart (NOVOLOG) 100 UNIT/ML injection Before each meal 3 times a day, 120-150 - 2 units, 151-200 - 4 units, 201-250 - 8 units, 251-300 - 10 units,  301 or above 10 units and call your MD Patient taking differently: Inject 10 Units into the skin 2 (two) times daily.  12/10/11   Leroy Sea, MD  insulin glargine (LANTUS) 100 UNIT/ML injection 30  Units sq Qhs and 12 units q am 11/17/17   Shon Hale, MD  lactobacillus acidophilus & bulgar (LACTINEX) chewable tablet Chew 1 tablet by mouth 3 (three) times daily with meals. 11/17/17   Shon Hale, MD  levothyroxine (SYNTHROID, LEVOTHROID) 150 MCG tablet Take 1 tablet (150 mcg total) by mouth daily before breakfast. Patient taking differently: Take 112 mcg by mouth daily before breakfast.  04/13/15   Osvaldo Shipper, MD  losartan (COZAAR) 100 MG tablet Take 100 mg by mouth daily.    [provider]  mercaptopurine (PURINETHOL) 50 MG tablet Take 150 mg by mouth daily. Give on an empty stomach 1 hour before or 2 hours after meals. Caution: Chemotherapy.    [provider]  oxyCODONE (ROXICODONE) 5 MG immediate release tablet Take 1 tablet (5 mg total) by mouth every 4 (four) hours as needed for severe pain. 11/17/17   Shon Hale, MD  polyethylene glycol (MIRALAX / GLYCOLAX) packet Take 17 g by mouth daily. As needed for constipation 11/18/17   Shon Hale, MD  predniSONE (DELTASONE) 10 MG tablet Take 2 tablets (20 mg) daily with food for 4 days, then 1 tablet (10 mg) daily with food until seen by your GI doctor 11/17/17   Shon HaleEmokpae, Courage, MD  senna-docusate (SENOKOT-S) 8.6-50 MG tablet Take 2 tablets by mouth 2 (two) times daily. 11/17/17 11/17/18  Shon HaleEmokpae, Courage, MD  Spacer/Aero-Holding Chambers (BREATHERITE COLL SPACER ADULT) MISC 1 Device by Does not apply route every 6 (six) hours as needed. 07/03/17   Rhetta MuraSamtani, Jai-Gurmukh, MD  terconazole (TERAZOL 7) 0.4 % vaginal cream Place 1 applicator vaginally daily. 10/21/17    [provider]  Vitamin D, Ergocalciferol, (DRISDOL) 50000 units CAPS capsule Take 50,000 Units by mouth every 7 (seven) days.    [provider]     Family History  Problem Relation Age of Onset  . Kidney failure Father   . Hypertension Father     Social History   Socioeconomic History  . Marital status: Married    Spouse name: Not on file  . Number of children: Not on file  . Years of education: Not on file  . Highest education level: Not on file  Social Needs  . Financial resource strain: Not on file  . Food insecurity - worry: Not on file  . Food insecurity - inability: Not on file  . Transportation needs - medical: Not on file  . Transportation needs - non-medical: Not on file  Occupational History  . Not on file  Tobacco Use  . Smoking status: Never Smoker  . Smokeless tobacco: Never Used  Substance and Sexual Activity  . Alcohol use: No  . Drug use: No  . Sexual activity: Yes    Partners: Male  Other Topics Concern  . Not on file  Social History Narrative  . Not on file     Vital Signs: There were no vitals taken for this visit.  Physical Exam  Constitutional: She is oriented to person, place, and time.  Abdominal: Soft.  Neurological: She is alert and oriented to person, place, and time.  Skin: Skin is warm and dry.  Site is clean and dry Dressing is original No bleeding OP tan color   Psychiatric: She has a normal mood and affect.  Nursing note and vitals reviewed.  CT today: shows resolution of psoas abscess but with new collection Rt flank area per Dr Miles CostainShick This will require new drain placement. Existing drain can be removed while in IR for new placement 3/15    Imaging: No results found.  Labs:  CBC: Recent Labs    11/13/17 0451 11/14/17 0310 11/15/17 0435 11/17/17 0617  WBC 32.3* 28.3* 23.7* 21.3*  HGB 10.7* 10.8* 10.7* 11.0*  HCT 31.7* 32.2* 31.8* 32.2*  PLT 253 253 254 264    COAGS: Recent Labs     06/26/17 2156 11/12/17 1347  INR 1.67 1.27  APTT 39*  --     BMP: Recent Labs    11/13/17 0451 11/14/17 0310 11/15/17 0435 11/17/17 0617  NA 137 134* 137 134*  K 4.6 4.7 4.5 3.9  CL 107 107 109 106  CO2 24 21* 23 25  GLUCOSE 424* 455* 302* 206*  BUN 24* 24* 22* 22*  CALCIUM 8.6* 8.1* 8.6* 8.5*  CREATININE 1.35* 1.26* 1.06* 1.07*  GFRNONAA 41* 45* 55* 55*  GFRAA 48* 52* >60 >60    LIVER FUNCTION TESTS: Recent Labs    07/03/17 0323 11/12/17 1347 11/13/17 0336 11/17/17 0617  BILITOT 1.7* 1.5* 0.9 0.6  AST 63* 41 25 26  ALT 41 33 27 27  ALKPHOS 313* 191* 159* 171*  PROT 7.7 8.8* 7.8 7.1  ALBUMIN 1.5* 2.3* 2.0* 1.7*    Assessment:  CT shows resolution of R psoas abscess collection Plan for removal 3/15 while at [facility] IR. New Rt abd collection -- will require drain placement. Planned for 3/15 in IR [facility] 1100 am Pt is aware she will be in holding and monitored for 3-4 hrs after procedure per Dr Ezzard Flax for DC to home. (Family asking about [facility]-- asked pt and family to contact Dr Massie Maroon-- if needs.) Only would admit pt if medically necessary-- will be determined tomorrow.   SignedRobet Leu, PA-C 11/21/2017, 2:14 PM   Please refer to Dr. Miles Costain attestation of this note for management and plan.

## 2017-11-22 ENCOUNTER — Ambulatory Visit (HOSPITAL_COMMUNITY)
Admission: RE | Admit: 2017-11-22 | Discharge: 2017-11-22 | Disposition: A | Discharge status: Home / Self Care | Payer: 59 | Source: Ambulatory Visit | Attending: Interventional Radiology | Admitting: Interventional Radiology

## 2017-11-22 ENCOUNTER — Encounter (HOSPITAL_COMMUNITY): Payer: Self-pay

## 2017-11-22 DIAGNOSIS — E7800 Pure hypercholesterolemia, unspecified: Secondary | ICD-10-CM | POA: Diagnosis not present

## 2017-11-22 DIAGNOSIS — I10 Essential (primary) hypertension: Secondary | ICD-10-CM | POA: Insufficient documentation

## 2017-11-22 DIAGNOSIS — Z7952 Long term (current) use of systemic steroids: Secondary | ICD-10-CM | POA: Diagnosis not present

## 2017-11-22 DIAGNOSIS — Z885 Allergy status to narcotic agent status: Secondary | ICD-10-CM | POA: Diagnosis not present

## 2017-11-22 DIAGNOSIS — K6812 Psoas muscle abscess: Secondary | ICD-10-CM | POA: Diagnosis present

## 2017-11-22 DIAGNOSIS — Z79890 Hormone replacement therapy (postmenopausal): Secondary | ICD-10-CM | POA: Diagnosis not present

## 2017-11-22 DIAGNOSIS — Z794 Long term (current) use of insulin: Secondary | ICD-10-CM | POA: Diagnosis not present

## 2017-11-22 DIAGNOSIS — Z79899 Other long term (current) drug therapy: Secondary | ICD-10-CM | POA: Diagnosis not present

## 2017-11-22 DIAGNOSIS — E119 Type 2 diabetes mellitus without complications: Secondary | ICD-10-CM | POA: Insufficient documentation

## 2017-11-22 LAB — CBC WITH DIFFERENTIAL/PLATELET
BASOS ABS: 0 10*3/uL (ref 0.0–0.1)
BASOS PCT: 0 %
Eosinophils Absolute: 0.2 10*3/uL (ref 0.0–0.7)
Eosinophils Relative: 1 %
HEMATOCRIT: 37.1 % (ref 36.0–46.0)
Hemoglobin: 12.7 g/dL (ref 12.0–15.0)
LYMPHS PCT: 14 %
Lymphs Abs: 2.3 10*3/uL (ref 0.7–4.0)
MCH: 30 pg (ref 26.0–34.0)
MCHC: 34.2 g/dL (ref 30.0–36.0)
MCV: 87.5 fL (ref 78.0–100.0)
Monocytes Absolute: 1.4 10*3/uL — ABNORMAL HIGH (ref 0.1–1.0)
Monocytes Relative: 9 %
NEUTROS ABS: 12.7 10*3/uL — AB (ref 1.7–7.7)
NEUTROS PCT: 76 %
PLATELETS: 260 10*3/uL (ref 150–400)
RBC: 4.24 MIL/uL (ref 3.87–5.11)
RDW: 14.9 % (ref 11.5–15.5)
WBC: 16.7 10*3/uL — AB (ref 4.0–10.5)

## 2017-11-22 LAB — COMPREHENSIVE METABOLIC PANEL
ALBUMIN: 2.3 g/dL — AB (ref 3.5–5.0)
ALT: 29 U/L (ref 14–54)
AST: 37 U/L (ref 15–41)
Alkaline Phosphatase: 217 U/L — ABNORMAL HIGH (ref 38–126)
Anion gap: 9 (ref 5–15)
BILIRUBIN TOTAL: 0.7 mg/dL (ref 0.3–1.2)
BUN: 14 mg/dL (ref 6–20)
CHLORIDE: 99 mmol/L — AB (ref 101–111)
CO2: 30 mmol/L (ref 22–32)
CREATININE: 0.93 mg/dL (ref 0.44–1.00)
Calcium: 8.6 mg/dL — ABNORMAL LOW (ref 8.9–10.3)
GFR calc Af Amer: >60 mL/min (ref >60)
GFR calc non Af Amer: >60 mL/min (ref >60)
GLUCOSE: 84 mg/dL (ref 65–99)
POTASSIUM: 3.8 mmol/L (ref 3.5–5.1)
Sodium: 138 mmol/L (ref 135–145)
Total Protein: 8.4 g/dL — ABNORMAL HIGH (ref 6.5–8.1)

## 2017-11-22 LAB — PROTIME-INR
INR: 1.13
Prothrombin Time: 14.4 seconds (ref 11.4–15.2)

## 2017-11-22 MED ORDER — FENTANYL CITRATE (PF) 100 MCG/2ML IJ SOLN
INTRAMUSCULAR | Status: AC | PRN
  Administered 2017-11-22 (×3): 50 ug via INTRAVENOUS

## 2017-11-22 MED ORDER — SODIUM CHLORIDE 0.9% FLUSH: 5.0000 mL | Freq: Three times a day (TID) | INTRAVENOUS | Status: DC

## 2017-11-22 MED ORDER — FENTANYL CITRATE (PF) 100 MCG/2ML IJ SOLN
INTRAMUSCULAR | Status: AC
  Filled 2017-11-22: qty 4

## 2017-11-22 MED ORDER — SODIUM CHLORIDE 0.9 % IV SOLN
INTRAVENOUS | Status: DC
  Administered 2017-11-22: 12:00:00 via INTRAVENOUS

## 2017-11-22 MED ORDER — SODIUM CHLORIDE FLUSH 0.9 % IV SOLN
INTRAVENOUS | Status: AC
  Filled 2017-11-22: qty 100

## 2017-11-22 MED ORDER — MIDAZOLAM HCL 2 MG/2ML IJ SOLN
INTRAMUSCULAR | Status: AC
  Filled 2017-11-22: qty 4

## 2017-11-22 MED ORDER — LIDOCAINE HCL 1 % IJ SOLN
INTRAMUSCULAR | Status: AC | PRN
  Administered 2017-11-22: 10 mL via INTRADERMAL

## 2017-11-22 MED ORDER — MIDAZOLAM HCL 2 MG/2ML IJ SOLN
INTRAMUSCULAR | Status: AC | PRN
  Administered 2017-11-22 (×3): 1 mg via INTRAVENOUS

## 2017-11-22 NOTE — H&P (Signed)
Referring Physician(s): Griffin,E  Supervising Physician: Oley BalmHassell, Daniel  Patient Status:  Dana [facility]  Chief Complaint: Right abdominal pain/retroperitoneal abscess   Subjective: Patient familiar to IR service from recent right psoas abscess drainage on 11/13/17.  She was seen in IR clinic yesterday for follow-up and CT revealed resolution of the drained right psoas abscess with improvement in the right perinephric inflammatory process and small microabscess at the right kidney lower pole but enlarging undrained right lateral retroperitoneal abscess.  She presents today for CT-guided drainage of this right lateral retroperitoneal abscess.  Previous drain fluid cultures grew E. coli.  She has been on Omnicef.  She currently denies high fevers but has had sweats, occasional headache, continued right abdominal soreness and constipation.  She denies chest pain, dyspnea, cough, nausea, vomiting or bleeding.  Additional history as below. Past Medical History:  Diagnosis Date  . Hypercholesterolemia   . Hypertension   . Kidney stones   . Type II diabetes mellitus (HCC)    Past Surgical History:  Procedure Laterality Date  . BREAST EXCISIONAL BIOPSY    . CESAREAN SECTION  1999  . DILATION AND CURETTAGE OF UTERUS     "I lost a baby"  . IR RADIOLOGIST EVAL & MGMT  11/21/2017  . TUBAL LIGATION  1999      Allergies: Codeine  Medications: Prior to Admission medications   Medication Sig Start Date End Date Taking? Authorizing Provider  acetaminophen (TYLENOL) 325 MG tablet Take 2 tablets (650 mg total) by mouth every 6 (six) hours as needed for mild pain, moderate pain, fever or headache. 11/17/17  Yes Emokpae, Courage, MD  amLODipine (NORVASC) 10 MG tablet Take 1 tablet (10 mg total) by mouth daily. 11/17/17  Yes Shon HaleEmokpae, Courage, MD  carvedilol (COREG) 12.5 MG tablet Take 1 tablet (12.5 mg total) by mouth 2 (two) times daily with a meal. 11/17/17  Yes Emokpae, Courage, MD  cefdinir (OMNICEF)  300 MG capsule Take 1 capsule (300 mg total) by mouth 2 (two) times daily for 12 days. 11/17/17 11/29/17 Yes Emokpae, Courage, MD  ezetimibe (ZETIA) 10 MG tablet Take 10 mg by mouth daily.   Yes [provider]  insulin aspart (NOVOLOG) 100 UNIT/ML injection Before each meal 3 times a day, 120-150 - 2 units, 151-200 - 4 units, 201-250 - 8 units, 251-300 - 10 units,  301 or above 10 units and call your MD Patient taking differently: Inject 10 Units into the skin 2 (two) times daily.  12/10/11  Yes Leroy SeaSingh, Prashant K, MD  insulin glargine (LANTUS) 100 UNIT/ML injection 30  Units sq Qhs and 12 units q am 11/17/17  Yes Emokpae, Courage, MD  lactobacillus acidophilus & bulgar (LACTINEX) chewable tablet Chew 1 tablet by mouth 3 (three) times daily with meals. 11/17/17  Yes Emokpae, Courage, MD  levothyroxine (SYNTHROID, LEVOTHROID) 150 MCG tablet Take 1 tablet (150 mcg total) by mouth daily before breakfast. Patient taking differently: Take 112 mcg by mouth daily before breakfast.  04/13/15  Yes Osvaldo ShipperKrishnan, Gokul, MD  losartan (COZAAR) 100 MG tablet Take 100 mg by mouth daily.   Yes [provider]  mercaptopurine (PURINETHOL) 50 MG tablet Take 150 mg by mouth daily. Give on an empty stomach 1 hour before or 2 hours after meals. Caution: Chemotherapy.   Yes [provider]  oxyCODONE (ROXICODONE) 5 MG immediate release tablet Take 1 tablet (5 mg total) by mouth every 4 (four) hours as needed for severe pain. 11/17/17  Yes Emokpae,  Courage, MD  polyethylene glycol (MIRALAX / GLYCOLAX) packet Take 17 g by mouth daily. As needed for constipation 11/18/17  Yes Emokpae, Courage, MD  predniSONE (DELTASONE) 10 MG tablet Take 2 tablets (20 mg) daily with food for 4 days, then 1 tablet (10 mg) daily with food until seen by your GI doctor 11/17/17  Yes Emokpae, Courage, MD  senna-docusate (SENOKOT-S) 8.6-50 MG tablet Take 2 tablets by mouth 2 (two) times daily. 11/17/17 11/17/18 Yes Emokpae, Courage, MD    Spacer/Aero-Holding Chambers (BREATHERITE COLL SPACER ADULT) MISC 1 Device by Does not apply route every 6 (six) hours as needed. 07/03/17  Yes Rhetta Mura, MD  terconazole (TERAZOL 7) 0.4 % vaginal cream Place 1 applicator vaginally daily. 10/21/17  Yes [provider]  Vitamin D, Ergocalciferol, (DRISDOL) 50000 units CAPS capsule Take 50,000 Units by mouth every 7 (seven) days.   Yes [provider]  albuterol (PROVENTIL HFA;VENTOLIN HFA) 108 (90 Base) MCG/ACT inhaler Inhale 2 puffs into the lungs every 4 (four) hours as needed for wheezing or shortness of breath. 11/17/17   Shon Hale, MD     Vital Signs: BP 137/81 (BP Location: Right Arm)   Pulse 68   Temp 97.7 F (36.5 C) (Oral)   Resp 18   Ht 5\' 2"  (1.575 m)   Wt 205 lb (93 kg)   SpO2 98%   BMI 37.49 kg/m   Physical Exam awake, alert.  Chest with slightly diminished breath sounds bases, heart with regular rate and rhythm.  Abdomen soft, positive bowel sounds, intact right flank drain with small amount of bloody fluid in bag, site mild to moderately tender to palpation.  No significant lower extremity edema  Imaging: Ct Abdomen Pelvis W Contrast  Result Date: 11/21/2017 CLINICAL DATA:  Right perinephric/retroperitoneal abscess, status post percutaneous drain EXAM: CT ABDOMEN AND PELVIS WITH CONTRAST TECHNIQUE: Multidetector CT imaging of the abdomen and pelvis was performed using the standard protocol following bolus administration of intravenous contrast. CONTRAST:  ISOVUE-300 IOPAMIDOL (ISOVUE-300) INJECTION 61% COMPARISON:  11/12/2017 FINDINGS: Lower chest: Minor left base atelectasis. No pericardial or pleural effusion. Normal heart size. Hepatobiliary: Small calcified gallstones noted. Minor micro nodularity to the anterior liver surface suggesting mild hepatic cirrhosis. No focal hepatic abnormality or intrahepatic biliary dilatation. Hepatic and portal veins are patent. Pancreas:  Unremarkable. No pancreatic ductal dilatation or surrounding inflammatory changes. Spleen: Normal in size without focal abnormality. Adrenals/Urinary Tract: Stable 17 mm right adrenal nodule, suspect adenoma. Left adrenal gland is normal. Scattered hypodense renal cysts, largest in the right kidney measures 5.2 cm. No renal obstruction or hydronephrosis. No hydroureter or obstructing ureteral calculus. Urinary bladder is collapsed. Interval improvement in the abnormal soft tissue and enhancement about the right kidney lower pole with small residual renal abscess suspected of the right lower measuring 2.2 cm, image 37. Stable drain catheter position in the right retroperitoneal psoas region. In the region of the drain catheter, the previous abscess has resolved. However, there is an enlarging peripherally enhancing loculated air-fluid collection in the right lateral retroperitoneum measuring 9 x 6.8 cm, image 47 series 2 compatible with a residual right flank retroperitoneal undrained abscess. This appears separate from the current drain catheter position and larger than 11/12/2017. Stomach/Bowel: Negative for bowel obstruction, significant dilatation, ileus, or free air. Cecum extends across the midline to the left lower quadrant. Normal appendix demonstrated in the left lower quadrant. Scattered colonic diverticulosis without acute inflammatory process. Vascular/Lymphatic: Aortoiliac atherosclerosis noted. No occlusive process, aneurysm or  dissection appreciated. Renal and mesenteric arterial vasculature appear patent. Circumaortic left renal vein noted. Reproductive: Uterus and bilateral adnexa are unremarkable. Other: No inguinal or abdominal wall hernia. Musculoskeletal: Body anasarca noted. IMPRESSION: Right retroperitoneal psoas abscess at the drain catheter site has resolved. Stable drain catheter position. Improvement in the right perinephric inflammatory process and small microabscess at the right kidney  lower pole compared to the prior study compatible with improving pyelonephritis. Enlarging undrained right lateral retroperitoneal abscess measuring up to 9 x 6.8 cm. This is scheduled for CT-guided drainage tomorrow 11/22/2017 at [facility]. Additional incidental findings include hepatic cirrhosis, cholelithiasis, and bilateral renal cysts. Colonic diverticulosis without acute inflammatory process Electronically Signed   By: Judie Petit.  Shick M.D.   On: 11/21/2017 17:16   Ir Radiologist Eval & Mgmt  Result Date: 11/21/2017 Please refer to notes tab for details about interventional procedure. ([facility] Note)   Labs:  CBC: Recent Labs    11/14/17 0310 11/15/17 0435 11/17/17 0617 11/22/17 1142  WBC 28.3* 23.7* 21.3* 16.7*  HGB 10.8* 10.7* 11.0* 12.7  HCT 32.2* 31.8* 32.2* 37.1  PLT 253 254 264 260    COAGS: Recent Labs    06/26/17 2156 11/12/17 1347 11/22/17 1142  INR 1.67 1.27 1.13  APTT 39*  --   --     BMP: Recent Labs    11/14/17 0310 11/15/17 0435 11/17/17 0617 11/22/17 1142  NA 134* 137 134* 138  K 4.7 4.5 3.9 3.8  CL 107 109 106 99*  CO2 21* 23 25 30   GLUCOSE 455* 302* 206* 84  BUN 24* 22* 22* 14  CALCIUM 8.1* 8.6* 8.5* 8.6*  CREATININE 1.26* 1.06* 1.07* 0.93  GFRNONAA 45* 55* 55* >60  GFRAA 52* >60 >60 >60    LIVER FUNCTION TESTS: Recent Labs    11/12/17 1347 11/13/17 0336 11/17/17 0617 11/22/17 1142  BILITOT 1.5* 0.9 0.6 0.7  AST 41 25 26 37  ALT 33 27 27 29   ALKPHOS 191* 159* 171* 217*  PROT 8.8* 7.8 7.1 8.4*  ALBUMIN 2.3* 2.0* 1.7* 2.3*    Assessment and Plan: Patient status post drainage of a right psoas abscess on 11/13/17 with apparent resolution on follow-up CT yesterday; cultures grew a few E. coli.  Patient on Omnicef.  Now with enlarging undrained right lateral retroperitoneal abscess; presents today for CT-guided drainage of this undrained abscess.Risks and benefits discussed with the patient/spouse including bleeding, infection, damage to  adjacent structures, bowel perforation/fistula connection, and sepsis.  All of the patient's questions were answered, patient is agreeable to proceed. Consent signed and in chart.  Pertinent labs include WBC 16.7, down from 21.3, hemoglobin 12.7, platelets 260k, creatinine 0.93, PT 14.4, INR 1.13.   Electronically Signed: D. Jeananne Rama, PA-C 11/22/2017, 12:27 PM   I spent a total of 25 minutes at the the patient's bedside AND on the patient's hospital floor or unit, greater than 50% of which was counseling/coordinating care for CT-guided drainage of right lateral retroperitoneal abscess

## 2017-11-22 NOTE — Discharge Instructions (Signed)
Percutaneous Abscess Drain, Care After °This sheet gives you information about how to care for yourself after your procedure. Your health care provider may also give you more specific instructions. If you have problems or questions, contact your health care provider. °What can I expect after the procedure? °After your procedure, it is common to have: °· A small amount of bruising and discomfort in the area where the drainage tube (catheter) was placed. °· Sleepiness and fatigue. This should go away after the medicines you were given have worn off. ° °Follow these instructions at home: °Incision care °· Follow instructions from your health care provider about how to take care of your incision. Make sure you: °? Wash your hands with soap and water before you change your bandage (dressing). If soap and water are not available, use hand sanitizer. °? Change your dressing as told by your health care provider. °? Leave stitches (sutures), skin glue, or adhesive strips in place. These skin closures may need to stay in place for 2 weeks or longer. If adhesive strip edges start to loosen and curl up, you may trim the loose edges. Do not remove adhesive strips completely unless your health care provider tells you to do that. °· Check your incision area every day for signs of infection. Check for: °? More redness, swelling, or pain. °? More fluid or blood. °? Warmth. °? Pus or a bad smell. °? Fluid leaking from around your catheter (instead of fluid draining through your catheter). °Catheter care °· Follow instructions from your health care provider about emptying and cleaning your catheter and collection bag. You may need to clean the catheter every day so it does not clog. °· If directed, write down the following information every time you empty your bag: °? The date and time. °? The amount of drainage. °General instructions °· Rest at home for 1-2 days after your procedure. Return to your normal activities as told by your  health care provider. °· Do not take baths, swim, or use a hot tub for 24 hours after your procedure, or until your health care provider says that this is okay. °· Take over-the-counter and prescription medicines only as told by your health care provider. °· Keep all follow-up visits as told by your health care provider. This is important. °Contact a health care provider if: °· You have less than 10 mL of drainage a day for 2-3 days in a row, or as directed by your health care provider. °· You have more redness, swelling, or pain around your incision area. °· You have more fluid or blood coming from your incision area. °· Your incision area feels warm to the touch. °· You have pus or a bad smell coming from your incision area. °· You have fluid leaking from around your catheter (instead of through your catheter). °· You have a fever or chills. °· You have pain that does not get better with medicine. °Get help right away if: °· Your catheter comes out. °· You suddenly stop having drainage from your catheter. °· You suddenly have blood in the fluid that is draining from your catheter. °· You become dizzy or you faint. °· You develop a rash. °· You have nausea or vomiting. °· You have difficulty breathing or you feel short of breath. °· You develop chest pain. °· You have problems with your speech or vision. °· You have trouble balancing or moving your arms or legs. °Summary °· It is common to have a small   amount of bruising and discomfort in the area where the drainage tube (catheter) was placed. °· You may be directed to record the amount of drainage from the bag every time you empty it. °· Follow instructions from your health care provider about emptying and cleaning your catheter and collection bag. °This information is not intended to replace advice given to you by your health care provider. Make sure you discuss any questions you have with your health care provider. °Document Released: 01/11/2014 Document  Revised: 07/19/2016 Document Reviewed: 07/19/2016 °Elsevier Interactive Patient Education © 2017 [employer] °Moderate Conscious Sedation, Adult, Care After °These instructions provide you with information about caring for yourself after your procedure. Your health care provider may also give you more specific instructions. Your treatment has been planned according to current medical practices, but problems sometimes occur. Call your health care provider if you have any problems or questions after your procedure. °What can I expect after the procedure? °After your procedure, it is common: °· To feel sleepy for several hours. °· To feel clumsy and have poor balance for several hours. °· To have poor judgment for several hours. °· To vomit if you eat too soon. ° °Follow these instructions at home: °For at least 24 hours after the procedure: ° °· Do not: °? Participate in activities where you could fall or become injured. °? Drive. °? Use heavy machinery. °? Drink alcohol. °? Take sleeping pills or medicines that cause drowsiness. °? Make important decisions or sign legal documents. °? Take care of children on your own. °· Rest. °Eating and drinking °· Follow the diet recommended by your health care provider. °· If you vomit: °? Drink water, juice, or soup when you can drink without vomiting. °? Make sure you have little or no nausea before eating solid foods. °General instructions °· Have a responsible adult stay with you until you are awake and alert. °· Take over-the-counter and prescription medicines only as told by your health care provider. °· If you smoke, do not smoke without supervision. °· Keep all follow-up visits as told by your health care provider. This is important. °Contact a health care provider if: °· You keep feeling nauseous or you keep vomiting. °· You feel light-headed. °· You develop a rash. °· You have a fever. °Get help right away if: °· You have trouble breathing. °This information is not  intended to replace advice given to you by your health care provider. Make sure you discuss any questions you have with your health care provider. °Document Released: 06/17/2013 Document Revised: 01/30/2016 Document Reviewed: 12/17/2015 °Elsevier Interactive Patient Education © 2018 [employer] ° °

## 2017-11-22 NOTE — Procedures (Signed)
  Procedure: CT R retroperit abscess drain placed ; R psoas drain removed EBL:   minimal Complications:  none immediate  See full dictation in [facility].  Thora Lance. Jayvion Stefanski MD Main # [phone] Pager  [phone]

## 2017-11-26 ENCOUNTER — Telehealth: Payer: Self-pay | Admitting: Radiology

## 2017-11-26 NOTE — Telephone Encounter (Signed)
Call made to patient regarding need for follow-up CT abdomen pelvis to reassess recently drained right retroperitoneal abscess on 11/22/17.  She will be scheduled for follow-up week of 3/25.  She was instructed to continue drain flushes and output recording.

## 2017-11-27 ENCOUNTER — Other Ambulatory Visit: Payer: Self-pay | Admitting: Urology

## 2017-11-27 DIAGNOSIS — K6819 Other retroperitoneal abscess: Secondary | ICD-10-CM

## 2017-11-27 LAB — AEROBIC/ANAEROBIC CULTURE W GRAM STAIN (SURGICAL/DEEP WOUND)

## 2017-11-27 LAB — AEROBIC/ANAEROBIC CULTURE (SURGICAL/DEEP WOUND): SPECIAL REQUESTS: NORMAL

## 2017-12-03 ENCOUNTER — Ambulatory Visit
Admission: RE | Admit: 2017-12-03 | Discharge: 2017-12-03 | Disposition: A | Discharge status: Home / Self Care | Payer: 59 | Source: Ambulatory Visit | Attending: Urology | Admitting: Urology

## 2017-12-03 ENCOUNTER — Ambulatory Visit
Admission: RE | Admit: 2017-12-03 | Discharge: 2017-12-03 | Disposition: A | Discharge status: Home / Self Care | Payer: 59 | Source: Ambulatory Visit | Attending: Radiology | Admitting: Radiology

## 2017-12-03 ENCOUNTER — Encounter: Payer: Self-pay | Admitting: Radiology

## 2017-12-03 DIAGNOSIS — K6812 Psoas muscle abscess: Secondary | ICD-10-CM

## 2017-12-03 DIAGNOSIS — K6819 Other retroperitoneal abscess: Secondary | ICD-10-CM

## 2017-12-03 HISTORY — PX: IR RADIOLOGIST EVAL & MGMT: IMG5224

## 2017-12-03 MED ORDER — IOPAMIDOL (ISOVUE-300) INJECTION 61%
125.0000 mL | Freq: Once | INTRAVENOUS | Status: AC | PRN
  Administered 2017-12-03: 125 mL via INTRAVENOUS

## 2017-12-03 NOTE — Progress Notes (Signed)
Patient ID: Dana Thornton, female   DOB: Oct 31, 1955, 62 y.o.   MRN: [id number]       Chief Complaint:  Outpatient follow-up for a right) no abscess drain  Referring Physician(s): Allred,Darrell K  History of Present Illness: Dana Thornton is a 62 y.o. female with complicated complex right lower pole pyelonephritis resulting in perinephric and right retroperitoneal abscesses.  She is now status post 2 percutaneous drains.  She returns for outpatient follow-up of the one remaining right lateral retroperitoneal drain.  Overall she is improving clinically.  No fevers.  Improvement in her flank pain.  No dysuria or signs of acute urinary tract infection.  She remains on antibiotics orally.  Subjectively she is feeling better.  CT imaging today demonstrates resolution of the large right lateral retroperitoneal abscess status post percutaneous drain.  Right kidney lower pole demonstrates residual phlegmonous reaction with 2 small fluid collections, 1 along the right kidney lower pole and and a second collection along the right medial iliopsoas muscle.  These are 2 cm or less in size.  Past Medical History:  Diagnosis Date  . Hypercholesterolemia   . Hypertension   . Kidney stones   . Type II diabetes mellitus (HCC)     Past Surgical History:  Procedure Laterality Date  . BREAST EXCISIONAL BIOPSY    . CESAREAN SECTION  1999  . DILATION AND CURETTAGE OF UTERUS     "I lost a baby"  . IR RADIOLOGIST EVAL & MGMT  11/21/2017  . IR RADIOLOGIST EVAL & MGMT  12/03/2017  . TUBAL LIGATION  1999    Allergies: Codeine  Medications: Prior to Admission medications   Medication Sig Start Date End Date Taking? Authorizing Provider  acetaminophen (TYLENOL) 325 MG tablet Take 2 tablets (650 mg total) by mouth every 6 (six) hours as needed for mild pain, moderate pain, fever or headache. 11/17/17  Yes Emokpae, Courage, MD  albuterol (PROVENTIL HFA;VENTOLIN HFA) 108 (90 Base) MCG/ACT inhaler Inhale 2  puffs into the lungs every 4 (four) hours as needed for wheezing or shortness of breath. 11/17/17  Yes Emokpae, Courage, MD  amLODipine (NORVASC) 10 MG tablet Take 1 tablet (10 mg total) by mouth daily. 11/17/17  Yes Shon Hale, MD  carvedilol (COREG) 12.5 MG tablet Take 1 tablet (12.5 mg total) by mouth 2 (two) times daily with a meal. 11/17/17  Yes Emokpae, Courage, MD  cefdinir (OMNICEF) 300 MG capsule Take 300 mg by mouth 2 (two) times daily.   Yes [provider]  ezetimibe (ZETIA) 10 MG tablet Take 10 mg by mouth daily.   Yes [provider]  insulin aspart (NOVOLOG) 100 UNIT/ML injection Before each meal 3 times a day, 120-150 - 2 units, 151-200 - 4 units, 201-250 - 8 units, 251-300 - 10 units,  301 or above 10 units and call your MD Patient taking differently: Inject 10 Units into the skin 2 (two) times daily.  12/10/11  Yes Leroy Sea, MD  insulin glargine (LANTUS) 100 UNIT/ML injection 30  Units sq Qhs and 12 units q am 11/17/17  Yes Emokpae, Courage, MD  lactobacillus acidophilus & bulgar (LACTINEX) chewable tablet Chew 1 tablet by mouth 3 (three) times daily with meals. 11/17/17  Yes Emokpae, Courage, MD  levothyroxine (SYNTHROID, LEVOTHROID) 150 MCG tablet Take 1 tablet (150 mcg total) by mouth daily before breakfast. Patient taking differently: Take 112 mcg by mouth daily before breakfast.  04/13/15  Yes Osvaldo Shipper, MD  losartan (COZAAR) 100  MG tablet Take 100 mg by mouth daily.   Yes [provider]  mercaptopurine (PURINETHOL) 50 MG tablet Take 150 mg by mouth daily. Give on an empty stomach 1 hour before or 2 hours after meals. Caution: Chemotherapy.   Yes [provider]  oxyCODONE (ROXICODONE) 5 MG immediate release tablet Take 1 tablet (5 mg total) by mouth every 4 (four) hours as needed for severe pain. 11/17/17  Yes Emokpae, Courage, MD  polyethylene glycol (MIRALAX / GLYCOLAX) packet Take 17 g by mouth daily. As needed for constipation  11/18/17  Yes Emokpae, Courage, MD  predniSONE (DELTASONE) 10 MG tablet Take 2 tablets (20 mg) daily with food for 4 days, then 1 tablet (10 mg) daily with food until seen by your GI doctor 11/17/17  Yes Emokpae, Courage, MD  senna-docusate (SENOKOT-S) 8.6-50 MG tablet Take 2 tablets by mouth 2 (two) times daily. 11/17/17 11/17/18 Yes Emokpae, Courage, MD  Spacer/Aero-Holding Chambers (BREATHERITE COLL SPACER ADULT) MISC 1 Device by Does not apply route every 6 (six) hours as needed. 07/03/17  Yes Rhetta Mura, MD  terconazole (TERAZOL 7) 0.4 % vaginal cream Place 1 applicator vaginally daily. 10/21/17  Yes [provider]  Vitamin D, Ergocalciferol, (DRISDOL) 50000 units CAPS capsule Take 50,000 Units by mouth every 7 (seven) days.   Yes [provider]     Family History  Problem Relation Age of Onset  . Kidney failure Father   . Hypertension Father     Social History   Socioeconomic History  . Marital status: Married    Spouse name: Not on file  . Number of children: Not on file  . Years of education: Not on file  . Highest education level: Not on file  Occupational History  . Not on file  Social Needs  . Financial resource strain: Not on file  . Food insecurity:    Worry: Not on file    Inability: Not on file  . Transportation needs:    Medical: Not on file    Non-medical: Not on file  Tobacco Use  . Smoking status: Never Smoker  . Smokeless tobacco: Never Used  Substance and Sexual Activity  . Alcohol use: No  . Drug use: No  . Sexual activity: Yes    Partners: Male  Lifestyle  . Physical activity:    Days per week: Not on file    Minutes per session: Not on file  . Stress: Not on file  Relationships  . Social connections:    Talks on phone: Not on file    Gets together: Not on file    Attends religious service: Not on file    Active member of club or organization: Not on file    Attends meetings of clubs or organizations: Not on file     Relationship status: Not on file  Other Topics Concern  . Not on file  Social History Narrative  . Not on file     Review of Systems: A 12 point ROS discussed and pertinent positives are indicated in the HPI above.  All other systems are negative.  Review of Systems  Vital Signs: BP (!) 150/94   Pulse 83   Temp 97.9 F (36.6 C) (Oral)   Resp 14   Ht 5\' 2"  (1.575 m)   Wt 205 lb (93 kg)   SpO2 99%   BMI 37.49 kg/m   Physical Exam  Constitutional: She appears well-developed and well-nourished. No distress.  Abdominal: Soft. Bowel  sounds are normal.  Right lower quadrant flank drain catheter site is clean, dry and intact.  Minimal serous output in the gravity collection bag  Skin: She is not diaphoretic.     Imaging: Dg Chest 2 View  Result Date: 11/12/2017 CLINICAL DATA:  Night sweats for 2 weeks. History of diabetes and hypertension. EXAM: CHEST - 2 VIEW COMPARISON:  Chest radiograph July 07, 2017 FINDINGS: Cardiac silhouette is mildly enlarged. Improved aeration lungs, however there is persistent bronchial wall thickening with bandlike density RIGHT lung base. Mildly elevated RIGHT hemidiaphragm. No pleural effusion. No pneumothorax. Soft tissue planes and included osseous structures are non suspicious. IMPRESSION: Bronchitic changes with RIGHT lung base atelectasis, less likely pneumonia. Stable cardiomegaly. Electronically Signed   By: Awilda Metro M.D.   On: 11/12/2017 17:12   Ct Abdomen Pelvis W Contrast  Result Date: 12/03/2017 CLINICAL DATA:  Complex chronic right perinephric/retroperitoneal abscess, status post 2 separate percutaneous drains, outpatient follow-up EXAM: CT ABDOMEN AND PELVIS WITH CONTRAST TECHNIQUE: Multidetector CT imaging of the abdomen and pelvis was performed using the standard protocol following bolus administration of intravenous contrast. CONTRAST:  ISOVUE-300 IOPAMIDOL (ISOVUE-300) INJECTION 61% COMPARISON:  11/21/2017, 11/22/2017  FINDINGS: Lower chest: No acute abnormality. Hepatobiliary: Micro nodularity of the hepatic surface compatible with mild hepatic cirrhosis. No focal hepatic abnormality or biliary dilatation. Small gallstones incidentally noted. Common bile duct is nondilated. Pancreas: Unremarkable. No pancreatic ductal dilatation or surrounding inflammatory changes. Spleen: Normal in size without focal abnormality. Adrenals/Urinary Tract: Normal adrenal glands. Kidneys demonstrate several bilateral hypodense renal cysts. No renal obstruction or hydronephrosis. Right kidney lower pole demonstrates stable perinephric strandy soft tissue with residual enhancement and too small fluid collections, 1 along the lower pole measuring 1.6 cm (unchanged) and a second along the right psoas muscle measuring 2 cm (at the previous drain catheter site). Right lateral retroperitoneal abscess has resolved status post percutaneous drain. This drain catheter is stable in position along the right colon. No recurrent abscess at the drain catheter site. Stomach/Bowel: Negative for bowel obstruction, significant dilatation, ileus, or free air. Normal appendix demonstrated. Scattered colonic diverticulosis without acute inflammatory process. Vascular/Lymphatic: Aortoiliac atherosclerosis noted. Negative for occlusive process or aneurysm. No adenopathy. Reproductive: Uterus and adnexa normal in size. No pelvic fluid collection or abscess. Other: No inguinal or abdominal wall hernia. Musculoskeletal: No acute osseous finding. IMPRESSION: Resolved right lateral retroperitoneal abscess, status post percutaneous drain. This drain catheter can be removed today. Two small residual right inferior perinephric and right psoas fluid collections appearing complex in nature with similar strandy inflammation and soft tissue enhancement of the right kidney lower pole and retroperitoneal space compatible with chronic complex residual pyelonephritis. Additional incidental  findings include hepatic cirrhosis, cholelithiasis, and renal cysts. Colonic diverticulosis without acute inflammatory process. Aortoiliac atherosclerosis. These results were called by telephone at the time of interpretation on 12/03/2017 at 12:54 pm to Dr. Maurice Small, who verbally acknowledged these results. Electronically Signed   By: Judie Petit.  Jenel Gierke M.D.   On: 12/03/2017 12:56   Ct Abdomen Pelvis W Contrast  Result Date: 11/21/2017 CLINICAL DATA:  Right perinephric/retroperitoneal abscess, status post percutaneous drain EXAM: CT ABDOMEN AND PELVIS WITH CONTRAST TECHNIQUE: Multidetector CT imaging of the abdomen and pelvis was performed using the standard protocol following bolus administration of intravenous contrast. CONTRAST:  ISOVUE-300 IOPAMIDOL (ISOVUE-300) INJECTION 61% COMPARISON:  11/12/2017 FINDINGS: Lower chest: Minor left base atelectasis. No pericardial or pleural effusion. Normal heart size. Hepatobiliary: Small calcified gallstones noted. Minor micro nodularity  to the anterior liver surface suggesting mild hepatic cirrhosis. No focal hepatic abnormality or intrahepatic biliary dilatation. Hepatic and portal veins are patent. Pancreas: Unremarkable. No pancreatic ductal dilatation or surrounding inflammatory changes. Spleen: Normal in size without focal abnormality. Adrenals/Urinary Tract: Stable 17 mm right adrenal nodule, suspect adenoma. Left adrenal gland is normal. Scattered hypodense renal cysts, largest in the right kidney measures 5.2 cm. No renal obstruction or hydronephrosis. No hydroureter or obstructing ureteral calculus. Urinary bladder is collapsed. Interval improvement in the abnormal soft tissue and enhancement about the right kidney lower pole with small residual renal abscess suspected of the right lower measuring 2.2 cm, image 37. Stable drain catheter position in the right retroperitoneal psoas region. In the region of the drain catheter, the previous abscess has resolved.  However, there is an enlarging peripherally enhancing loculated air-fluid collection in the right lateral retroperitoneum measuring 9 x 6.8 cm, image 47 series 2 compatible with a residual right flank retroperitoneal undrained abscess. This appears separate from the current drain catheter position and larger than 11/12/2017. Stomach/Bowel: Negative for bowel obstruction, significant dilatation, ileus, or free air. Cecum extends across the midline to the left lower quadrant. Normal appendix demonstrated in the left lower quadrant. Scattered colonic diverticulosis without acute inflammatory process. Vascular/Lymphatic: Aortoiliac atherosclerosis noted. No occlusive process, aneurysm or dissection appreciated. Renal and mesenteric arterial vasculature appear patent. Circumaortic left renal vein noted. Reproductive: Uterus and bilateral adnexa are unremarkable. Other: No inguinal or abdominal wall hernia. Musculoskeletal: Body anasarca noted. IMPRESSION: Right retroperitoneal psoas abscess at the drain catheter site has resolved. Stable drain catheter position. Improvement in the right perinephric inflammatory process and small microabscess at the right kidney lower pole compared to the prior study compatible with improving pyelonephritis. Enlarging undrained right lateral retroperitoneal abscess measuring up to 9 x 6.8 cm. This is scheduled for CT-guided drainage tomorrow 11/22/2017 at [facility]. Additional incidental findings include hepatic cirrhosis, cholelithiasis, and bilateral renal cysts. Colonic diverticulosis without acute inflammatory process Electronically Signed   By: Judie Petit.  Gailen Venne M.D.   On: 11/21/2017 17:16   Ct Abdomen Pelvis W Contrast  Result Date: 11/12/2017 CLINICAL DATA:  Right upper quadrant pain x2 weeks, melena, history of renal stones. EXAM: CT ABDOMEN AND PELVIS WITH CONTRAST TECHNIQUE: Multidetector CT imaging of the abdomen and pelvis was performed using the standard protocol  following bolus administration of intravenous contrast. CONTRAST:  ISOVUE-300 IOPAMIDOL (ISOVUE-300) INJECTION 61% COMPARISON:  06/27/2017 FINDINGS: Lower chest: Lung bases are clear. Hepatobiliary: Nodular hepatic contour, suggesting cirrhosis. No focal hepatic lesion is seen. Layering 6 mm gallstone (series 2/image 42), without associated inflammatory changes. No intrahepatic or extrahepatic ductal dilatation. Pancreas: Within normal limits. Spleen: Spleen is normal in size. Adrenals/Urinary Tract: 1.7 cm right adrenal nodule (series 2/image 26; sagittal image 82), indeterminate, although favored to be present in 2013, suggesting a benign adrenal adenoma. Left adrenal gland is within normal limits. Multiple bilateral renal cysts, measuring up to 5.2 cm in the anterior interpolar right kidney (series 2/image 36). No hydronephrosis. Abnormal soft tissue/inflammation with heterogeneous enhancement of the right lower kidney (series 2/image 44). Secondary involvement of the right psoas muscle with associated centrally necrotic 3.8 x 5.7 x 6.6 cm lesion in the right psoas (series 2/image 47). Associated fluid/gas in the right retroperitoneum/posterior pararenal space, measuring up to 5.9 x 4.9 x 6.0 cm (series 2/image 54), although this does not reflect a well-defined fluid collection. Overall, given this constellation of findings, this appearance favors pyelonephritis with associated psoas  abscess and gas-forming infection extending into the retroperitoneum/posterior pararenal space. Technically speaking, some of this abnormal soft tissue and the right psoas abnormality could reflect tumor, but this would not account for the associated soft tissue inflammation/gas. Bladder is within normal limits. Stomach/Bowel: Stomach is within normal limits. No evidence of bowel obstruction. Normal appendix (series 2/image 62). Notably, the ascending colon/cecum and appendix are remote from the fluid/gas in the posterior  pararenal space. Vascular/Lymphatic: No evidence of abdominal aortic aneurysm. Atherosclerotic calcifications of the abdominal aorta and branch vessels. No suspicious abdominopelvic lymphadenopathy. Reproductive: Uterus is within normal limits. Bilateral ovaries are within normal limits. Other: No abdominopelvic ascites. Musculoskeletal: Right psoas lesion, as described above. Visualized osseous structures are within normal limits. IMPRESSION: Abnormal soft tissue/inflammation with heterogeneous enhancement of the right lower kidney, favoring pyelonephritis, with associated psoas abscess and gas-forming infection extending into the right retroperitoneum/posterior pararenal space. Suspected psoas abscess currently measures up to 6.6 cm. Ill-defined fluid/gas in the right posterior pararenal space measures to 6.0 cm, but this does not reflect a well-defined fluid collection. Additional ancillary findings as above, including cirrhosis, cholelithiasis, and bilateral renal cysts. These results were called by telephone at the time of interpretation on 11/12/2017 at 11:39 am to [facility] , who verbally acknowledged these results. Electronically Signed   By: Charline Bills M.D.   On: 11/12/2017 11:41   Dg Abd Acute W/chest  Result Date: 11/14/2017 CLINICAL DATA:  Nausea. Incision site pain. Pain at the incision site. EXAM: DG ABDOMEN ACUTE W/ 1V CHEST COMPARISON:  CT abdomen pelvis 11/12/2017. FINDINGS: Monitoring leads overlie the patient. Stable cardiac and mediastinal contours. Right basilar heterogeneous opacities. No pleural effusion or pneumothorax. Pigtail drainage catheter projects over the right hemiabdomen. Gas is demonstrated within nondilated loops of large and small bowel in a nonobstructed pattern. No free intraperitoneal air. Unremarkable osseous skeleton. IMPRESSION: Right basilar opacities favored to represent atelectasis. Pigtail drainage catheter projects over the right hemiabdomen. Nonobstructed  bowel gas pattern. Electronically Signed   By: Annia Belt M.D.   On: 11/14/2017 20:11   Ct Image Guided Fluid Drain By Catheter  Result Date: 11/22/2017 CLINICAL DATA:  Right psoas abscess, resolved post percutaneous drainage. However, there has been worsening of an adjacent retroperitoneal complex abscess collection. Drainage requested. EXAM: CT GUIDED DRAINAGE OF RETROPERITONEAL ABSCESS ANESTHESIA/SEDATION: Intravenous Fentanyl and Versed were administered as conscious sedation during continuous monitoring of the patient's level of consciousness and physiological / cardiorespiratory status by the radiology RN, with a total moderate sedation time of 22 minutes. PROCEDURE: The procedure, risks, benefits, and alternatives were explained to the patient. Questions regarding the procedure were encouraged and answered. The patient understands and consents to the procedure. Patient position left lateral decubitus. Select axial scans through the lower abdomen were obtained. The right retroperitoneal collection was localized and an appropriate skin entry site was determined and marked. The operative field was prepped with chlorhexidinein a sterile fashion, and a sterile drape was applied covering the operative field. A sterile gown and sterile gloves were used for the procedure. Local anesthesia was provided with 1% Lidocaine. Under CT fluoroscopic guidance, a 19 gauge percutaneous entry needle was advanced into the collection from a lateral approach. Purulent material could be aspirated. An Amplatz guidewire advanced easily, position within the collection confirmed on CT. Tract dilated to facilitate placement of a 12 French drain catheter, formed centrally within the collection. Position confirmed on CT. Catheter secured externally with 0 Prolene suture and StatLock and placed to gravity  drain bag. 15 mL of the purulent aspirate was sent for Gram stain and culture. Catheter was covered with a sterile dressing. The  psoas abscess drain catheter was cut and removed without complication. The patient tolerated the procedure well. COMPLICATIONS: None immediate FINDINGS: Limited CT confirms multiloculated right retroperitoneal abscess collection. 12 French pigtail drain catheter placed as above. Sample of the aspirate sent for Gram stain and culture. The right psoas abscess drain catheter was removed uneventfully. IMPRESSION: 1. Technically successful CT-guided right retroperitoneal abscess drain catheter placement. 2. Removal of right psoas drain catheter. Electronically Signed   By: Corlis Leak M.D.   On: 11/22/2017 16:16   Ct Image Guided Drainage By Percutaneous Catheter  Result Date: 11/13/2017 CLINICAL DATA:  Right psoas abscess EXAM: CT GUIDED DRAINAGE OF RIGHT PSOAS ABSCESS ANESTHESIA/SEDATION: Intravenous Fentanyl and Versed were administered as conscious sedation during continuous monitoring of the patient's level of consciousness and physiological / cardiorespiratory status by the radiology RN, with a total moderate sedation time of 14 minutes. PROCEDURE: The procedure, risks, benefits, and alternatives were explained to the patient. Questions regarding the procedure were encouraged and answered. The patient understands and consents to the procedure. Patient placed in left lateral decubitus position. Limited axial scans through the abdomen were obtained. The right psoas collection was localized and an appropriate skin entry site was determined and marked. The operative field was prepped with chlorhexidinein a sterile fashion, and a sterile drape was applied covering the operative field. A sterile gown and sterile gloves were used for the procedure. Local anesthesia was provided with 1% Lidocaine. Under CT fluoroscopic guidance, an 18 gauge trocar needle was advanced into the collection using a right paraspinal approach. Once needle tip was seen in the collection, aspiration VL purulent material. An Amplatz wire advanced  easily, its position confirmed on CT. Tract dilated to facilitate placement of a 14 French 12 French pigtail drain catheter, formed centrally within the collection. CT confirmed appropriate drain catheter position. 20 mL of blood-tinged purulent aspirate were sent for Gram stain and culture. The catheter was flushed and placed to external gravity drain bag. The catheter was secured externally with 0 Prolene suture and StatLock. The patient tolerated the procedure well. COMPLICATIONS: None immediate FINDINGS: The right psoas abscess was again localized. 12 French abscess drain catheter placed. Sample of the aspirate sent for Gram stain and culture. IMPRESSION: 1. Technically successful CT-guided right psoas abscess drain catheter placement Electronically Signed   By: Corlis Leak M.D.   On: 11/13/2017 17:44   Ir Radiologist Eval & Mgmt  Result Date: 12/03/2017 Please refer to notes tab for details about interventional procedure. (Op Note)  Ir Radiologist Eval & Mgmt  Result Date: 11/21/2017 Please refer to notes tab for details about interventional procedure. (Op Note)   Labs:  CBC: Recent Labs    11/14/17 0310 11/15/17 0435 11/17/17 0617 11/22/17 1142  WBC 28.3* 23.7* 21.3* 16.7*  HGB 10.8* 10.7* 11.0* 12.7  HCT 32.2* 31.8* 32.2* 37.1  PLT 253 254 264 260    COAGS: Recent Labs    06/26/17 2156 11/12/17 1347 11/22/17 1142  INR 1.67 1.27 1.13  APTT 39*  --   --     BMP: Recent Labs    11/14/17 0310 11/15/17 0435 11/17/17 0617 11/22/17 1142  NA 134* 137 134* 138  K 4.7 4.5 3.9 3.8  CL 107 109 106 99*  CO2 21* 23 25 30   GLUCOSE 455* 302* 206* 84  BUN 24* 22* 22*  14  CALCIUM 8.1* 8.6* 8.5* 8.6*  CREATININE 1.26* 1.06* 1.07* 0.93  GFRNONAA 45* 55* 55* >60  GFRAA 52* >60 >60 >60    LIVER FUNCTION TESTS: Recent Labs    11/12/17 1347 11/13/17 0336 11/17/17 0617 11/22/17 1142  BILITOT 1.5* 0.9 0.6 0.7  AST 41 25 26 37  ALT 33 27 27 29   ALKPHOS 191* 159* 171* 217*    PROT 8.8* 7.8 7.1 8.4*  ALBUMIN 2.3* 2.0* 1.7* 2.3*    TUMOR MARKERS: No results for input(s): AFPTM, CEA, CA199, CHROMGRNA in the last 8760 hours.  Assessment and Plan:  The large lateral right retroperitoneal abscess has resolved status post percutaneous drain by CT.  Therefore this drain catheter can be removed today.  Chronic complex residual pyelonephritis with 2 small residual right perinephric and right iliopsoas fluid collections both measuring 2 cm or less in size.  These collections are small and chronic.  I do not think she would benefit from additional image guided drainage or aspirations as she is improving clinically.  Plan: Continue oral antibiotics as an outpatient and follow clinically.  Outpatient follow-up with Dr. Maurice SmallElaine Griffin.  Consider referral to ID.  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

## 2017-12-05 ENCOUNTER — Other Ambulatory Visit: Payer: 59

## 2017-12-16 ENCOUNTER — Ambulatory Visit: Payer: 59 | Admitting: Infectious Diseases

## 2017-12-19 ENCOUNTER — Ambulatory Visit (INDEPENDENT_AMBULATORY_CARE_PROVIDER_SITE_OTHER): Payer: 59 | Admitting: Infectious Diseases

## 2017-12-19 ENCOUNTER — Encounter: Payer: Self-pay | Admitting: Infectious Diseases

## 2017-12-19 VITALS — BP 133/85 | HR 77 | Temp 97.9°F | Ht 62.0 in | Wt 213.0 lb

## 2017-12-19 DIAGNOSIS — K6812 Psoas muscle abscess: Secondary | ICD-10-CM | POA: Diagnosis not present

## 2017-12-19 DIAGNOSIS — E1165 Type 2 diabetes mellitus with hyperglycemia: Secondary | ICD-10-CM | POA: Diagnosis not present

## 2017-12-19 DIAGNOSIS — IMO0001 Reserved for inherently not codable concepts without codable children: Secondary | ICD-10-CM

## 2017-12-19 DIAGNOSIS — K754 Autoimmune hepatitis: Secondary | ICD-10-CM | POA: Diagnosis not present

## 2017-12-19 MED ORDER — CEFDINIR 300 MG PO CAPS: 300.0000 mg | ORAL_CAPSULE | Freq: Two times a day (BID) | ORAL | 0 refills | Status: AC

## 2017-12-19 NOTE — Assessment & Plan Note (Signed)
Managed by Dr. Bosie ClosSchooler and remains on chronic prednisone 10 mg daily.

## 2017-12-19 NOTE — Assessment & Plan Note (Addendum)
Perinephritic abscess has completely resolved. There is still evidence of stranding and chronic pyelonephritis with 2 small areas of fluid. Overall she has improved clinically after 6 weeks of omnicef and perc drain but still having some night sweats and some tenderness. Because of this and the fact she is on chronic prednisone I will continue her omnicef another 6 weeks. She will return for 3 weeks to check in with me. Will check labs today to see if these will be helpful in directing treatment although with her chronic prednisone and autoimmune hepatitis there is a chance they will not be.

## 2017-12-19 NOTE — Progress Notes (Addendum)
Patient: Dana Thornton  DOB: 10-30-1955 MRN: [id number] PCP: Maurice Small, MD  Referring Provider: Maurice Small, MD [facility]  GI Doctor / Hepatologist: Dr. Bosie Clos [facility])   Patient Active Problem List   Diagnosis Date Noted  . 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 Hyperkalemia   . AKI (acute kidney injury) (HCC)   . Acute pyelonephritis   . Hyperglycemia 04/09/2015  . ARF (acute renal failure) (HCC) 04/09/2015  . Transaminitis 04/09/2015  . Protein-calorie malnutrition, severe (HCC) 03/30/2015  . Uncontrolled diabetes mellitus type 2 without complications (HCC) 03/29/2015  . HTN (hypertension) 12/07/2011  . Autoimmune hepatitis (HCC) 12/03/2011     Subjective:   Chief Complaint  Patient presents with  . New Patient (Initial Visit)    e coli pyelonephritis, renal and psoas muscle abscess   Dana Thornton is a 62 y.o. AA female with pmhx detailed below but significant for autoimmune hepatitis (chronic prednisone 10 mg daily), HTN, DM, CKD and UTIs.   Recently in March 2019 required hospitalization for worsened right flank pain that radiated to her right leg x 2 weeks. In the ED she had leukocytosis and fever; a CT scan was obtained and showed pyelonephritis with nephritic abscess and psoas abscess. Associated symptoms included night sweats, nausea, vomiting. IR was consulted and percutaneous drain was placed on 11/13/17 to psoas muscle abscess. And on 11/15/17 nephritic drain was placed . Blood cultures, urine culture and culture from drain sampling collected between 3/5 - 3/8 were all growing pansensitive e coli. She received Vancomycin + Cefepime until 3/10 where she received 2 gm rocephin x 1 and discharged on Omnicef 300 mg BID x 12 days after that. On 12/02/17 she underwent repeat CT scan with resolved right lateral retroperitoneal abscess (drain was removed that day) but she also had two small residual right inferior perinephric and right psoas fluid  collections appearing complex in nature with similar strandy inflammation and soft tissue enhancement to the right kidney c/w residual pyelonephritis.   She has been feeling much better since released from the hospital and on her antibiotics. No fevers but still does awaken at night some times with "soaked clothes" due to sweating. She believes this to be medication related however not a new medication. She has about 1 week left of her cefdinir and has tolerated it well without diarrhea, nausea or rashes. Her leg pain/weakness has completely resolved. She is still out of work as she has had some stress and needed "a little longer" to deal with the recovery of being hospitalized like she was. Still has some drainage from the right hip area where the drain was removed recently. No back pain or tenderness.   Review of Systems  Constitutional: Positive for diaphoresis. Negative for chills and fever.  HENT: Negative for tinnitus.   Eyes: Negative for blurred vision and photophobia.  Respiratory: Negative for cough and sputum production.   Cardiovascular: Negative for chest pain.  Gastrointestinal: Negative for abdominal pain, diarrhea, nausea and vomiting.  Genitourinary: Negative for dysuria.  Musculoskeletal: Negative for myalgias.  Skin: Negative for rash.  Neurological: Negative for focal weakness and headaches.  Psychiatric/Behavioral: Negative for depression.    Past Medical History:  Diagnosis Date  . Hypercholesterolemia   . Hypertension   . Kidney stones   . Type II diabetes mellitus (HCC)     Outpatient Medications Prior to Visit  Medication Sig Dispense Refill  . acetaminophen (TYLENOL) 325 MG tablet Take 2 tablets (650  mg total) by mouth every 6 (six) hours as needed for mild pain, moderate pain, fever or headache. 100 tablet 0  . albuterol (PROVENTIL HFA;VENTOLIN HFA) 108 (90 Base) MCG/ACT inhaler Inhale 2 puffs into the lungs every 4 (four) hours as needed for wheezing or  shortness of breath. 1 Inhaler 2  . amLODipine (NORVASC) 10 MG tablet Take 1 tablet (10 mg total) by mouth daily. 30 tablet 1  . carvedilol (COREG) 12.5 MG tablet Take 1 tablet (12.5 mg total) by mouth 2 (two) times daily with a meal. 60 tablet 2  . ezetimibe (ZETIA) 10 MG tablet Take 10 mg by mouth daily.    . insulin aspart (NOVOLOG) 100 UNIT/ML injection Before each meal 3 times a day, 120-150 - 2 units, 151-200 - 4 units, 201-250 - 8 units, 251-300 - 10 units,  301 or above 10 units and call your MD (Patient taking differently: Inject 10 Units into the skin 2 (two) times daily. ) 1 vial 1  . insulin glargine (LANTUS) 100 UNIT/ML injection 30  Units sq Qhs and 12 units q am 10 mL 2  . levothyroxine (SYNTHROID, LEVOTHROID) 150 MCG tablet Take 1 tablet (150 mcg total) by mouth daily before breakfast. (Patient taking differently: Take 112 mcg by mouth daily before breakfast. ) 30 tablet 2  . losartan (COZAAR) 100 MG tablet Take 100 mg by mouth daily.    Marland Kitchen oxyCODONE (ROXICODONE) 5 MG immediate release tablet Take 1 tablet (5 mg total) by mouth every 4 (four) hours as needed for severe pain. 15 tablet 0  . predniSONE (DELTASONE) 10 MG tablet Take 2 tablets (20 mg) daily with food for 4 days, then 1 tablet (10 mg) daily with food until seen by your GI doctor 30 tablet 1  . senna-docusate (SENOKOT-S) 8.6-50 MG tablet Take 2 tablets by mouth 2 (two) times daily. 120 tablet 1  . Spacer/Aero-Holding Chambers (BREATHERITE COLL SPACER ADULT) MISC 1 Device by Does not apply route every 6 (six) hours as needed. 1 each 0  . terconazole (TERAZOL 7) 0.4 % vaginal cream Place 1 applicator vaginally daily.  0  . Vitamin D, Ergocalciferol, (DRISDOL) 50000 units CAPS capsule Take 50,000 Units by mouth every 7 (seven) days.    . cefdinir (OMNICEF) 300 MG capsule Take 300 mg by mouth 2 (two) times daily.    Marland Kitchen lactobacillus acidophilus & bulgar (LACTINEX) chewable tablet Chew 1 tablet by mouth 3 (three) times daily with  meals. (Patient not taking: Reported on 12/19/2017) 30 tablet 1  . mercaptopurine (PURINETHOL) 50 MG tablet Take 150 mg by mouth daily. Give on an empty stomach 1 hour before or 2 hours after meals. Caution: Chemotherapy.    . polyethylene glycol (MIRALAX / GLYCOLAX) packet Take 17 g by mouth daily. As needed for constipation (Patient not taking: Reported on 12/19/2017) 14 each 0   No facility-administered medications prior to visit.      Allergies  Allergen Reactions  . Codeine Nausea And Vomiting    Social History   Tobacco Use  . Smoking status: Never Smoker  . Smokeless tobacco: Never Used  Substance Use Topics  . Alcohol use: No  . Drug use: No    Family History  Problem Relation Age of Onset  . Kidney failure Father   . Hypertension Father     Objective:   Vitals:   12/19/17 0840  BP: 133/85  Pulse: 77  Temp: 97.9 F (36.6 C)  Weight: 213 lb (96.6 kg)  Height: 5\' 2"  (1.575 m)   Body mass index is 38.96 kg/m.  Physical Exam  Constitutional: She is oriented to person, place, and time. She appears well-developed and well-nourished.  Seated comfortably in chair.   HENT:  Mouth/Throat: Mucous membranes are normal. No oral lesions. Normal dentition. No dental abscesses. No oropharyngeal exudate.  Cardiovascular: Normal rate, regular rhythm and normal heart sounds.  Pulmonary/Chest: Effort normal and breath sounds normal.  Abdominal: Soft. Bowel sounds are normal. She exhibits no distension. There is no tenderness.    Lymphadenopathy:    She has no cervical adenopathy.  Neurological: She is alert and oriented to person, place, and time.  Skin: Skin is warm and dry. No rash noted.  Psychiatric: She has a normal mood and affect. Judgment normal.  In good spirits today and engaged in care discussion  Vitals reviewed.   Lab Results: Lab Results  Component Value Date   WBC 16.7 (H) 11/22/2017   HGB 12.7 11/22/2017   HCT 37.1 11/22/2017   MCV 87.5 11/22/2017     PLT 260 11/22/2017    Lab Results  Component Value Date   CREATININE 0.93 11/22/2017   BUN 14 11/22/2017   NA 138 11/22/2017   K 3.8 11/22/2017   CL 99 (L) 11/22/2017   CO2 30 11/22/2017    Lab Results  Component Value Date   ALT 29 11/22/2017   AST 37 11/22/2017   ALKPHOS 217 (H) 11/22/2017   BILITOT 0.7 11/22/2017     Assessment & Plan:   Problem List Items Addressed This Visit      Digestive   Autoimmune hepatitis (HCC) - Primary    Managed by Dr. Bosie ClosSchooler and remains on chronic prednisone 10 mg daily.         Endocrine   Uncontrolled diabetes mellitus type 2 without complications [facility](HCC)    Working with endocrinologist Dr. Sharl MaKerr to manage. Awaiting appointment for medication management.   Lab Results  Component Value Date   HGBA1C 12.0 (H) 11/12/2017           Musculoskeletal and Integument   Psoas abscess (HCC)    Perinephritic abscess has completely resolved. There is still evidence of stranding and chronic pyelonephritis with 2 small areas of fluid. Overall she has improved clinically after 6 weeks of omnicef and perc drain but still having some night sweats and some tenderness. Because of this and the fact she is on chronic prednisone I will continue her omnicef another 6 weeks. She will return for 3 weeks to check in with me. Will check labs today to see if these will be helpful in directing treatment although with her chronic prednisone and autoimmune hepatitis there is a chance they will not be.       Relevant Orders   CBC with Differential/Platelet   Basic metabolic panel   Sedimentation rate   C-reactive protein     Rexene AlbertsStephanie Dixon, MSN, NP-C [facility] Pager: [phone] Office: [phone]  12/19/17  11:19 AM

## 2017-12-19 NOTE — Patient Instructions (Signed)
Nice to meet you today!  Will refill your antibiotics for another 5 weeks since there are 2 small areas that are left over on your most recent scan.   Will check some blood work today to help decide about your treatment.   Please return in 3 weeks.

## 2017-12-19 NOTE — Assessment & Plan Note (Signed)
Working with endocrinologist Dr. Sharl MaKerr to manage. Awaiting appointment for medication management.   Lab Results  Component Value Date   HGBA1C 12.0 (H) 11/12/2017

## 2017-12-20 LAB — CBC WITH DIFFERENTIAL/PLATELET
BASOS ABS: 30 {cells}/uL (ref 0–200)
Basophils Relative: 0.4 %
EOS ABS: 76 {cells}/uL (ref 15–500)
Eosinophils Relative: 1 %
HCT: 36.4 % (ref 35.0–45.0)
HEMOGLOBIN: 12.1 g/dL (ref 11.7–15.5)
Lymphs Abs: 1733 cells/uL (ref 850–3900)
MCH: 29.1 pg (ref 27.0–33.0)
MCHC: 33.2 g/dL (ref 32.0–36.0)
MCV: 87.5 fL (ref 80.0–100.0)
MPV: 11.6 fL (ref 7.5–12.5)
Monocytes Relative: 8.5 %
NEUTROS ABS: 5115 {cells}/uL (ref 1500–7800)
Neutrophils Relative %: 67.3 %
Platelets: 199 10*3/uL (ref 140–400)
RBC: 4.16 10*6/uL (ref 3.80–5.10)
RDW: 14.7 % (ref 11.0–15.0)
Total Lymphocyte: 22.8 %
WBC mixed population: 646 cells/uL (ref 200–950)
WBC: 7.6 10*3/uL (ref 3.8–10.8)

## 2017-12-20 LAB — BASIC METABOLIC PANEL
BUN/Creatinine Ratio: 18 (calc) (ref 6–22)
BUN: 20 mg/dL (ref 7–25)
CALCIUM: 9.4 mg/dL (ref 8.6–10.4)
CO2: 28 mmol/L (ref 20–32)
Chloride: 102 mmol/L (ref 98–110)
Creat: 1.13 mg/dL — ABNORMAL HIGH (ref 0.50–0.99)
Glucose, Bld: 245 mg/dL — ABNORMAL HIGH (ref 65–99)
POTASSIUM: 4.1 mmol/L (ref 3.5–5.3)
SODIUM: 136 mmol/L (ref 135–146)

## 2017-12-20 LAB — C-REACTIVE PROTEIN: CRP: 37.1 mg/L — ABNORMAL HIGH (ref <8.0)

## 2017-12-20 LAB — SEDIMENTATION RATE: Sed Rate: 114 mm/h — ABNORMAL HIGH (ref 0–30)

## 2018-01-09 ENCOUNTER — Telehealth: Payer: Self-pay

## 2018-01-09 ENCOUNTER — Encounter: Payer: Self-pay | Admitting: Infectious Diseases

## 2018-01-09 ENCOUNTER — Ambulatory Visit (INDEPENDENT_AMBULATORY_CARE_PROVIDER_SITE_OTHER): Payer: 59 | Admitting: Infectious Diseases

## 2018-01-09 VITALS — BP 151/94 | HR 67 | Temp 98.1°F | Wt 220.0 lb

## 2018-01-09 DIAGNOSIS — I10 Essential (primary) hypertension: Secondary | ICD-10-CM

## 2018-01-09 DIAGNOSIS — K6812 Psoas muscle abscess: Secondary | ICD-10-CM | POA: Diagnosis not present

## 2018-01-09 DIAGNOSIS — K754 Autoimmune hepatitis: Secondary | ICD-10-CM | POA: Diagnosis not present

## 2018-01-09 NOTE — Progress Notes (Signed)
Patient: Dana Thornton  DOB: 03/05/1956 MRN: [id number] PCP: Kelton Pillar, MD  Referring Provider: Kelton Pillar, MD [facility]  GI Doctor / Hepatologist: Dr. Michail Sermon [facility])   Patient Active Problem List   Diagnosis Date Noted  . Psoas abscess ([place]) 11/12/2017  . Hyperkalemia   . AKI (acute kidney injury) ([place])   . Acute pyelonephritis   . Hyperglycemia 04/09/2015  . ARF (acute renal failure) ([place]) 04/09/2015  . Transaminitis 04/09/2015  . Protein-calorie malnutrition, severe ([place]) 03/30/2015  . Uncontrolled diabetes mellitus type 2 without complications ([place]) 23/76/2831  . HTN (hypertension) 12/07/2011  . Autoimmune hepatitis ([place]) 12/03/2011     Subjective:   Chief Complaint  Patient presents with  . Follow-up    renal abscess    Dana Thornton is a 62 y.o. AA female with pmhx detailed below but significant for autoimmune hepatitis (chronic prednisone 10 mg daily), HTN, DM, CKD and UTIs.   Brief Narrative:  Recently in March 2019 required hospitalization for worsened right flank pain that radiated to her right leg x 2 weeks. In the ED she had leukocytosis and fever; a CT scan was obtained and showed pyelonephritis with nephritic abscess and psoas abscess. Associated symptoms included night sweats, nausea, vomiting. IR was consulted and percutaneous drain was placed on 11/13/17 to psoas muscle abscess. 11/15/17 nephritic drain was placed . Blood cultures, urine culture and culture from drain sampling collected between 3/5 - 3/8 were all growing pansensitive e coli. She received Vancomycin + Cefepime until 3/10 where she received 2 gm rocephin x 1 and discharged on Omnicef 300 mg BID x 12 days after that. On 12/02/17 she underwent repeat CT scan with resolved right lateral retroperitoneal abscess (drain was removed that day) but had two small residual right inferior perinephric and right psoas fluid collections appearing complex in nature with similar strandy  inflammation and soft tissue enhancement to the right kidney c/w residual pyelonephritis. Decided to extend out her therapy considering her chronic prednisone use.   HPI:  Aliah is here for follow up at 3 weeks of extended Omnicef BID - she feels well today and reports good compliance with her antibiotic. No side effects from her antibiotics and specifically denies diarrhea or rash.  States that the areas where her drains were previously have healed. Continues to have night sweats as she did prior to getting sick but no fevers/chills. Her blood sugar was low this morning at 70 but generally have been higher than normal. She is concerned about her blood pressure control today as she still has a systolic pressure > 517 despite recent increased in her Carvedilol. Prednisone was increased to 20 mg daily per her rheumatologist due to "lab results" for her autoimmune hepatitis. She is wondering if the increased prednisone could be responsible for the lability of her blood pressures. Denies any back pain, hip pain or inability to raise right leg.   Review of Systems  Constitutional: Positive for diaphoresis. Negative for chills and fever.  HENT: Negative for tinnitus.   Eyes: Negative for blurred vision and photophobia.  Respiratory: Negative for cough and sputum production.   Cardiovascular: Negative for chest pain.  Gastrointestinal: Negative for abdominal pain, diarrhea, nausea and vomiting.  Genitourinary: Negative for dysuria.  Musculoskeletal: Negative for myalgias.  Skin: Negative for rash.  Neurological: Positive for dizziness. Negative for focal weakness and headaches.  Psychiatric/Behavioral: Negative for depression.    Past Medical History:  Diagnosis Date  . Hypercholesterolemia   .  Hypertension   . Kidney stones   . Type II diabetes mellitus ([place])     Outpatient Medications Prior to Visit  Medication Sig Dispense Refill  . acetaminophen (TYLENOL) 325 MG tablet Take 2 tablets (650  mg total) by mouth every 6 (six) hours as needed for mild pain, moderate pain, fever or headache. 100 tablet 0  . albuterol (PROVENTIL HFA;VENTOLIN HFA) 108 (90 Base) MCG/ACT inhaler Inhale 2 puffs into the lungs every 4 (four) hours as needed for wheezing or shortness of breath. 1 Inhaler 2  . amLODipine (NORVASC) 10 MG tablet Take 1 tablet (10 mg total) by mouth daily. 30 tablet 1  . carvedilol (COREG) 12.5 MG tablet Take 1 tablet (12.5 mg total) by mouth 2 (two) times daily with a meal. 60 tablet 2  . cefdinir (OMNICEF) 300 MG capsule Take 1 capsule (300 mg total) by mouth 2 (two) times daily. 84 capsule 0  . ezetimibe (ZETIA) 10 MG tablet Take 10 mg by mouth daily.    . insulin aspart (NOVOLOG) 100 UNIT/ML injection Before each meal 3 times a day, 120-150 - 2 units, 151-200 - 4 units, 201-250 - 8 units, 251-300 - 10 units,  301 or above 10 units and call your MD (Patient taking differently: Inject 10 Units into the skin 2 (two) times daily. ) 1 vial 1  . insulin glargine (LANTUS) 100 UNIT/ML injection 30  Units sq Qhs and 12 units q am 10 mL 2  . lactobacillus acidophilus & bulgar (LACTINEX) chewable tablet Chew 1 tablet by mouth 3 (three) times daily with meals. 30 tablet 1  . levothyroxine (SYNTHROID, LEVOTHROID) 150 MCG tablet Take 1 tablet (150 mcg total) by mouth daily before breakfast. (Patient taking differently: Take 112 mcg by mouth daily before breakfast. ) 30 tablet 2  . losartan (COZAAR) 100 MG tablet Take 100 mg by mouth daily.    . mercaptopurine (PURINETHOL) 50 MG tablet Take 150 mg by mouth daily. Give on an empty stomach 1 hour before or 2 hours after meals. Caution: Chemotherapy.    Marland Kitchen oxyCODONE (ROXICODONE) 5 MG immediate release tablet Take 1 tablet (5 mg total) by mouth every 4 (four) hours as needed for severe pain. 15 tablet 0  . polyethylene glycol (MIRALAX / GLYCOLAX) packet Take 17 g by mouth daily. As needed for constipation 14 each 0  . predniSONE (DELTASONE) 10 MG tablet  Take 2 tablets (20 mg) daily with food for 4 days, then 1 tablet (10 mg) daily with food until seen by your GI doctor 30 tablet 1  . senna-docusate (SENOKOT-S) 8.6-50 MG tablet Take 2 tablets by mouth 2 (two) times daily. 120 tablet 1  . Spacer/Aero-Holding Chambers (BREATHERITE COLL SPACER ADULT) MISC 1 Device by Does not apply route every 6 (six) hours as needed. 1 each 0  . terconazole (TERAZOL 7) 0.4 % vaginal cream Place 1 applicator vaginally daily.  0  . Vitamin D, Ergocalciferol, (DRISDOL) 50000 units CAPS capsule Take 50,000 Units by mouth every 7 (seven) days.     No facility-administered medications prior to visit.      Allergies  Allergen Reactions  . Codeine Nausea And Vomiting    Social History   Tobacco Use  . Smoking status: Never Smoker  . Smokeless tobacco: Never Used  Substance Use Topics  . Alcohol use: No  . Drug use: No    Family History  Problem Relation Age of Onset  . Kidney failure Father   . Hypertension  Father     Objective:   Vitals:   01/09/18 0932  BP: (!) 151/94  Pulse: 67  Temp: 98.1 F (36.7 C)  TempSrc: Oral  Weight: 99.8 kg (220 lb)   Body mass index is 40.24 kg/m.  Physical Exam  Constitutional: She is oriented to person, place, and time. She appears well-developed and well-nourished.  Seated comfortably in chair.   HENT:  Mouth/Throat: Mucous membranes are normal. No oral lesions. Normal dentition. No dental abscesses. No oropharyngeal exudate.  Cardiovascular: Normal rate, regular rhythm and normal heart sounds.  Pulmonary/Chest: Effort normal and breath sounds normal.  Abdominal: Soft. Bowel sounds are normal. She exhibits no distension. There is no tenderness.    Lymphadenopathy:    She has no cervical adenopathy.  Neurological: She is alert and oriented to person, place, and time.  Skin: Skin is warm and dry. No rash noted.  Psychiatric: She has a normal mood and affect. Judgment normal.  In good spirits today and  engaged in care discussion  Vitals reviewed.   Lab Results: Lab Results  Component Value Date   WBC 7.6 12/19/2017   HGB 12.1 12/19/2017   HCT 36.4 12/19/2017   MCV 87.5 12/19/2017   PLT 199 12/19/2017    Lab Results  Component Value Date   CREATININE 1.13 (H) 12/19/2017   BUN 20 12/19/2017   NA 136 12/19/2017   K 4.1 12/19/2017   CL 102 12/19/2017   CO2 28 12/19/2017    Lab Results  Component Value Date   ALT 29 11/22/2017   AST 37 11/22/2017   ALKPHOS 217 (H) 11/22/2017   BILITOT 0.7 11/22/2017     Assessment & Plan:   Problem List Items Addressed This Visit      Cardiovascular and Mediastinum   HTN (hypertension)    BP Readings from Last 3 Encounters:  01/09/18 (!) 151/94  12/19/17 133/85  12/03/17 (!) 150/94   Pulse Readings from Last 3 Encounters:  01/09/18 67  12/19/17 77  12/03/17 83   She is concerned about her blood pressure and inability to return to work. She is working with her PCP Dr. Laurann Montana with management. She reports ongoing elevated BPs at home as well despite her increased Carvedilol. I am assuming the increase in her prednisone is contributing. Will forward note to Dr. Laurann Montana for determination of how to proceed.          Digestive   Autoimmune hepatitis ([place]) - Primary    Increased prednisone likely d/t her increased ESR. This is difficult to interpret as to the cause of elevation (infection vs underlying autoimmune hepatitis). Will not be using ESR/CRP to assist with therapy guidance considering multiple confounding information.         Musculoskeletal and Integument   Psoas abscess ([place])    No signs of ongoing process from physical exam. No back/hip pain or pain with straight leg raise on the right. She will continue the remainder of her antibiotics (3 weeks) to ensure chance for complete resolution considering her chronic prednisone and labile blood sugars. We discussed that she will return in 6 weeks to reassess her progress  including any indication for further work up with repeat CT scan.   Likely she had a benign cyst in her kidney that became super infected in the setting of pyelonephritis. It is difficult to otherwise determine her chance for reoccurrence as it seems like this was the "perfect storm scenario." I did however explain that her being on  chronic prednisone above 20 mg daily especially puts her at higher risk for acquired/opportunistic infectious processes.   She will return in 6 weeks or sooner if indicated.         Janene Madeira, MSN, NP-C [facility] [place] Pager: [phone] Office: [phone]  01/09/18  4:33 PM

## 2018-01-09 NOTE — Assessment & Plan Note (Addendum)
No signs of ongoing process from physical exam. No back/hip pain or pain with straight leg raise on the right. She will continue the remainder of her antibiotics (3 weeks) to ensure chance for complete resolution considering her chronic prednisone and labile blood sugars. We discussed that she will return in 6 weeks to reassess her progress including any indication for further work up with repeat CT scan.   Likely she had a benign cyst in her kidney that became super infected in the setting of pyelonephritis. It is difficult to otherwise determine her chance for reoccurrence as it seems like this was the "perfect storm scenario." I did however explain that her being on chronic prednisone above 20 mg daily especially puts her at higher risk for acquired/opportunistic infectious processes.   She will return in 6 weeks or sooner if indicated.

## 2018-01-09 NOTE — Assessment & Plan Note (Signed)
Increased prednisone likely d/t her increased ESR. This is difficult to interpret as to the cause of elevation (infection vs underlying autoimmune hepatitis). Will not be using ESR/CRP to assist with therapy guidance considering multiple confounding information.

## 2018-01-09 NOTE — Patient Instructions (Signed)
Continue your antibiotics until they are gone.   Things to call me about: - hip/leg or worsening back pain  - fevers - feeling like you did before you went into the hospital   We will have you return in 6 weeks to see how you are doing off antibiotics and consider if we need to repeat your CT scan to follow up at that point.   I am very encouraged at how well you are doing now.   Will forward all notes/labs to Dr. Valentina Lucks

## 2018-01-09 NOTE — Assessment & Plan Note (Signed)
BP Readings from Last 3 Encounters:  01/09/18 (!) 151/94  12/19/17 133/85  12/03/17 (!) 150/94   Pulse Readings from Last 3 Encounters:  01/09/18 67  12/19/17 77  12/03/17 83   She is concerned about her blood pressure and inability to return to work. She is working with her PCP Dr. Valentina Lucks with management. She reports ongoing elevated BPs at home as well despite her increased Carvedilol. I am assuming the increase in her prednisone is contributing. Will forward note to Dr. Valentina Lucks for determination of how to proceed.

## 2018-01-09 NOTE — Telephone Encounter (Signed)
Per pt faxed office note and labs to Maurice Small, MD.   Lorenso Courier, CMA

## 2018-02-13 ENCOUNTER — Encounter: Payer: Self-pay | Admitting: Infectious Diseases

## 2018-02-13 ENCOUNTER — Ambulatory Visit (INDEPENDENT_AMBULATORY_CARE_PROVIDER_SITE_OTHER): Payer: 59 | Admitting: Infectious Diseases

## 2018-02-13 ENCOUNTER — Telehealth: Payer: Self-pay

## 2018-02-13 VITALS — BP 179/132 | HR 85 | Temp 98.4°F | Wt 227.0 lb

## 2018-02-13 DIAGNOSIS — I10 Essential (primary) hypertension: Secondary | ICD-10-CM | POA: Diagnosis not present

## 2018-02-13 DIAGNOSIS — K754 Autoimmune hepatitis: Secondary | ICD-10-CM

## 2018-02-13 DIAGNOSIS — K6812 Psoas muscle abscess: Secondary | ICD-10-CM

## 2018-02-13 NOTE — Assessment & Plan Note (Signed)
Recently increased prednisone to 30 mg daily. Managing through her rheumatology team. She is hoping to decrease back to 10 mg daily to help with side effects and lessen the risk of atypical infection.   If she requires sustained maintenance on this dose for prolonged time frame I wonder if she would benefit from taking some prophylactic bactrim 1 DS tab 3x/week due to known increased infection risk beyond 20 mg daily.

## 2018-02-13 NOTE — Telephone Encounter (Signed)
-----   Message from Blanchard KelchStephanie N Dixon, NP sent at 02/13/2018 12:50 PM EDT ----- Can you please fax to Dr. Massie MaroonE Griffin @ [facility] per Ms Ha's request @ Call: [phone] Fax: [phone]

## 2018-02-13 NOTE — Assessment & Plan Note (Signed)
Not controlled today with increased prednisone dosing lately. She is working with Dr. Valentina LucksGriffin on this - I have given her a copy of her vital signs today so she can discuss with her managing provider.

## 2018-02-13 NOTE — Telephone Encounter (Signed)
Per Judeth CornfieldStephanie, NP faxed office note to Dr. Valentina LucksGriffin per pt's request.  Confirmation received  Lorenso CourierJose L Georgean Spainhower, CMA

## 2018-02-13 NOTE — Assessment & Plan Note (Addendum)
She has been off antibiotics nearly 3 weeks now and is feeling very well. Has returned to work full time after completion. No signs of fevers or chills (does have some temperature dysregulation with some frequent hypoglycemic events). No pain in the back/flank area or hip. No trouble walking. Will defer checking ESR/CRP with her underlying autoimmune hepatitis and CBC will not likely be helpful either with increased prednisone dose she certainly would have some leukocytosis to explain that. Her exam and ability to return to work full time is very reassuring. I am confident that we have achieved cure for Dana Thornton.  She can follow up with her primary care provider from here on out. If there is any concern for re-accumulation would recommend repeating CT of abdomen/pelvis. We are also happy to see her back as needed in the future should she need assistance with care.

## 2018-02-13 NOTE — Progress Notes (Signed)
Patient: Dana Thornton  DOB: August 04, 1956 MRN: [id number] PCP: Kelton Pillar, MD  Referring Provider: Kelton Pillar, MD [facility] Call: [phone] Fax: [phone] GI Doctor / Hepatologist: Dr. Michail Sermon [facility])   Patient Active Problem List   Diagnosis Date Noted  . 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 Hyperkalemia   . AKI (acute kidney injury) ([place])   . Hyperglycemia 04/09/2015  . Transaminitis 04/09/2015  . Protein-calorie malnutrition, severe ([place]) 03/30/2015  . Uncontrolled diabetes mellitus type 2 without complications ([place]) 15/01/6978  . HTN (hypertension) 12/07/2011  . Autoimmune hepatitis ([place]) 12/03/2011     Subjective:   Chief Complaint  Patient presents with  . Follow-up    pyelonephritis/psoas muscle abscess follow up off antibiotics     Brief Narrative:  Dana Thornton is a 62 y.o. AA female with pmhx detailed below but significant for autoimmune hepatitis (chronic prednisone 10 mg daily), HTN, DM, CKD and UTIs. March 2019 required hospitalization for worsened right flank pain that radiated to her right leg x 2 weeks. In the ED she had leukocytosis and fever; a CT scan was obtained and showed pyelonephritis with nephritic abscess and psoas abscess. IR was consulted and percutaneous drain was placed on 11/13/17 to psoas abscess. 11/15/17 nephritic drain was placed . Blood cultures, urine culture and culture from drain sampling collected between 3/5 - 3/8 were all growing pansensitive e coli. Rreceived Vancomycin + Cefepime until 3/10 where she received 2 gm rocephin x 1 and discharged on Omnicef 300 mg BID x 12 days after that. On 12/02/17 she underwent repeat CT scan with resolved right lateral retroperitoneal abscess (drain was removed that day) but had two small residual right inferior perinephric and right psoas fluid collections appearing complex in nature with similar strandy inflammation and soft tissue enhancement to the right kidney c/w residual  pyelonephritis. With residual similar appearing abscesses and her underlying immunocompromised state 2/2 chronic prednisone use I extended out treatment another 6 weeks.   HPI:  Dana Thornton is here for follow up at nearly 3 weeks following completion of her antibiotics. She has returned Prednisone was increased to 30 mg daily per her rheumatologist for her autoimmune hepatitis. She has had side effects from the prednisone that are unpleasant (bloating, difficulty sleeping) and labile blood pressures and blood sugars. Denies any back pain, hip pain or inability to raise right leg. She has returned to work full time about 3 weeks ago and feels she is "keeping up well."   Review of Systems  Constitutional: Positive for diaphoresis. Negative for chills and fever.  HENT: Negative for tinnitus.   Eyes: Negative for blurred vision and photophobia.  Respiratory: Negative for cough and sputum production.   Cardiovascular: Negative for chest pain.  Gastrointestinal: Negative for abdominal pain, diarrhea, nausea and vomiting.  Genitourinary: Negative for dysuria.  Musculoskeletal: Negative for myalgias.  Skin: Negative for rash.  Neurological: Positive for dizziness. Negative for focal weakness and headaches.  Psychiatric/Behavioral: Negative for depression.    Past Medical History:  Diagnosis Date  . Hypercholesterolemia   . Hypertension   . Kidney stones   . Type II diabetes mellitus ([place])     Outpatient Medications Prior to Visit  Medication Sig Dispense Refill  . albuterol (PROVENTIL HFA;VENTOLIN HFA) 108 (90 Base) MCG/ACT inhaler Inhale 2 puffs into the lungs every 4 (four) hours as needed for wheezing or shortness of breath. 1 Inhaler 2  . amLODipine (NORVASC) 10 MG tablet Take 1 tablet (10 mg  total) by mouth daily. 30 tablet 1  . carvedilol (COREG) 12.5 MG tablet Take 1 tablet (12.5 mg total) by mouth 2 (two) times daily with a meal. 60 tablet 2  . ezetimibe (ZETIA) 10 MG tablet Take 10 mg by  mouth daily.    . insulin aspart (NOVOLOG) 100 UNIT/ML injection Before each meal 3 times a day, 120-150 - 2 units, 151-200 - 4 units, 201-250 - 8 units, 251-300 - 10 units,  301 or above 10 units and call your MD (Patient taking differently: Inject 10 Units into the skin 2 (two) times daily. ) 1 vial 1  . insulin glargine (LANTUS) 100 UNIT/ML injection 30  Units sq Qhs and 12 units q am 10 mL 2  . levothyroxine (SYNTHROID, LEVOTHROID) 150 MCG tablet Take 1 tablet (150 mcg total) by mouth daily before breakfast. (Patient taking differently: Take 112 mcg by mouth daily before breakfast. ) 30 tablet 2  . losartan (COZAAR) 100 MG tablet Take 100 mg by mouth daily.    . predniSONE (DELTASONE) 10 MG tablet Take 2 tablets (20 mg) daily with food for 4 days, then 1 tablet (10 mg) daily with food until seen by your GI doctor 30 tablet 1  . Vitamin D, Ergocalciferol, (DRISDOL) 50000 units CAPS capsule Take 50,000 Units by mouth every 7 (seven) days.    Marland Kitchen acetaminophen (TYLENOL) 325 MG tablet Take 2 tablets (650 mg total) by mouth every 6 (six) hours as needed for mild pain, moderate pain, fever or headache. (Patient not taking: Reported on 02/13/2018) 100 tablet 0  . lactobacillus acidophilus & bulgar (LACTINEX) chewable tablet Chew 1 tablet by mouth 3 (three) times daily with meals. 30 tablet 1  . mercaptopurine (PURINETHOL) 50 MG tablet Take 150 mg by mouth daily. Give on an empty stomach 1 hour before or 2 hours after meals. Caution: Chemotherapy.    Marland Kitchen oxyCODONE (ROXICODONE) 5 MG immediate release tablet Take 1 tablet (5 mg total) by mouth every 4 (four) hours as needed for severe pain. (Patient not taking: Reported on 02/13/2018) 15 tablet 0  . polyethylene glycol (MIRALAX / GLYCOLAX) packet Take 17 g by mouth daily. As needed for constipation (Patient not taking: Reported on 02/13/2018) 14 each 0  . senna-docusate (SENOKOT-S) 8.6-50 MG tablet Take 2 tablets by mouth 2 (two) times daily. (Patient not taking:  Reported on 02/13/2018) 120 tablet 1  . Spacer/Aero-Holding Chambers (BREATHERITE COLL SPACER ADULT) MISC 1 Device by Does not apply route every 6 (six) hours as needed. 1 each 0  . terconazole (TERAZOL 7) 0.4 % vaginal cream Place 1 applicator vaginally daily.  0   No facility-administered medications prior to visit.      Allergies  Allergen Reactions  . Codeine Nausea And Vomiting    Social History   Tobacco Use  . Smoking status: Never Smoker  . Smokeless tobacco: Never Used  Substance Use Topics  . Alcohol use: No  . Drug use: No    Family History  Problem Relation Age of Onset  . Kidney failure Father   . Hypertension Father     Objective:   Vitals:   02/13/18 0906  BP: (!) 179/132  Pulse: 85  Temp: 98.4 F (36.9 C)  TempSrc: Oral  Weight: 227 lb (103 kg)   Body mass index is 41.52 kg/m.  Physical Exam  Constitutional: She is oriented to person, place, and time. She appears well-developed and well-nourished.  Seated comfortably in chair. Smiling. Has her hair  and make up done today.  HENT:  Mouth/Throat: Mucous membranes are normal. No oral lesions. Normal dentition. No dental abscesses. No oropharyngeal exudate.  Cardiovascular: Normal rate.  Pulmonary/Chest: Effort normal. No respiratory distress.  Abdominal: She exhibits distension.  Musculoskeletal: She exhibits no edema.       Right hip: She exhibits normal range of motion, normal strength, no tenderness and no swelling.  Neurological: She is alert and oriented to person, place, and time.  Skin: Skin is warm and dry. No rash noted.  Psychiatric: She has a normal mood and affect. Judgment normal.  In good spirits today and engaged in care discussion    Lab Results: Lab Results  Component Value Date   WBC 7.6 12/19/2017   HGB 12.1 12/19/2017   HCT 36.4 12/19/2017   MCV 87.5 12/19/2017   PLT 199 12/19/2017    Lab Results  Component Value Date   CREATININE 1.13 (H) 12/19/2017   BUN 20  12/19/2017   NA 136 12/19/2017   K 4.1 12/19/2017   CL 102 12/19/2017   CO2 28 12/19/2017    Lab Results  Component Value Date   ALT 29 11/22/2017   AST 37 11/22/2017   ALKPHOS 217 (H) 11/22/2017   BILITOT 0.7 11/22/2017     Assessment & Plan:   Problem List Items Addressed This Visit      Cardiovascular and Mediastinum   HTN (hypertension)    Not controlled today with increased prednisone dosing lately. She is working with Dr. Laurann Montana on this - I have given her a copy of her vital signs today so she can discuss with her managing provider.         Digestive   Autoimmune hepatitis ([place])    Recently increased prednisone to 30 mg daily. Managing through her rheumatology team. She is hoping to decrease back to 10 mg daily to help with side effects and lessen the risk of atypical infection.   If she requires sustained maintenance on this dose for prolonged time frame I wonder if she would benefit from taking some prophylactic bactrim 1 DS tab 3x/week due to known increased infection risk beyond 20 mg daily.         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 Has returned to work full time after completion. No signs of fevers or chills (does have some temperature dysregulation with some frequent hypoglycemic events). No pain in the back/flank area or hip. No trouble walking. Will defer checking ESR/CRP with her underlying autoimmune hepatitis and CBC will not likely be helpful either with increased prednisone dose she certainly would have some leukocytosis to explain that. Her exam and ability to return to work full time is very reassuring. I am confident that we have achieved cure for Ms. Juleen China.  She can follow up with her primary care provider from here on out. If there is any concern for re-accumulation would recommend repeating CT of abdomen/pelvis. We are also happy to see her back as  needed in the future should she need assistance with care.         Janene Madeira, MSN, NP-C [facility] [place] Pager: [phone] Office: [phone]  02/13/18  12:48 PM

## 2018-02-13 NOTE — Patient Instructions (Signed)
Wonderful to see you feeling better!   Please continue to work closely with Dr. Valentina LucksGriffin for ongoing care. We can see you if there is any concern about return of infection but I am very pleased with how you have been doing off antibiotics.   [facility] [place] [facility] [place] [place]

## 2018-08-12 ENCOUNTER — Other Ambulatory Visit: Payer: Self-pay | Admitting: Family Medicine

## 2018-08-12 DIAGNOSIS — Z1231 Encounter for screening mammogram for malignant neoplasm of breast: Secondary | ICD-10-CM

## 2018-09-22 ENCOUNTER — Ambulatory Visit
Admission: RE | Admit: 2018-09-22 | Discharge: 2018-09-22 | Disposition: A | Discharge status: Home / Self Care | Payer: 59 | Source: Ambulatory Visit | Attending: Family Medicine | Admitting: Family Medicine

## 2018-09-22 DIAGNOSIS — Z1231 Encounter for screening mammogram for malignant neoplasm of breast: Secondary | ICD-10-CM

## 2019-08-14 ENCOUNTER — Other Ambulatory Visit: Payer: Self-pay | Admitting: Family Medicine

## 2019-08-14 DIAGNOSIS — Z1231 Encounter for screening mammogram for malignant neoplasm of breast: Secondary | ICD-10-CM

## 2019-10-05 ENCOUNTER — Other Ambulatory Visit: Payer: Self-pay

## 2019-10-05 ENCOUNTER — Ambulatory Visit
Admission: RE | Admit: 2019-10-05 | Discharge: 2019-10-05 | Disposition: A | Discharge status: Home / Self Care | Payer: 59 | Source: Ambulatory Visit | Attending: Family Medicine | Admitting: Family Medicine

## 2019-10-05 DIAGNOSIS — Z1231 Encounter for screening mammogram for malignant neoplasm of breast: Secondary | ICD-10-CM

## 2019-11-09 ENCOUNTER — Ambulatory Visit: Payer: Self-pay | Attending: Internal Medicine

## 2019-11-09 DIAGNOSIS — Z23 Encounter for immunization: Secondary | ICD-10-CM | POA: Insufficient documentation

## 2019-11-09 NOTE — Progress Notes (Signed)
   Covid-19 Vaccination Clinic  Name:  NEVAEHA FINERTY    MRN: [id number] DOB: July 24, 1956  11/09/2019  Ms. Glomb was observed post Covid-19 immunization for 15 minutes without incidence. She was provided with Vaccine Information Sheet and instruction to access the V-Safe system.   Ms. Whitsel was instructed to call 911 with any severe reactions post vaccine: Marland Kitchen Difficulty breathing  . Swelling of your face and throat  . A fast heartbeat  . A bad rash all over your body  . Dizziness and weakness    Immunizations Administered    Name Date Dose VIS Date Route   Pfizer COVID-19 Vaccine 11/09/2019  1:50 PM 0.3 mL 08/21/2019 Intramuscular   Manufacturer: [employer], [employer]   Lot: [id number]   NDC: [address]-1

## 2019-12-02 ENCOUNTER — Ambulatory Visit: Payer: Self-pay

## 2019-12-02 ENCOUNTER — Ambulatory Visit: Payer: Self-pay | Attending: Internal Medicine

## 2019-12-02 DIAGNOSIS — Z23 Encounter for immunization: Secondary | ICD-10-CM

## 2019-12-02 NOTE — Progress Notes (Signed)
   Covid-19 Vaccination Clinic  Name:  Dana Thornton    MRN: [id number] DOB: [birth year]  12/02/2019  Dana Thornton was observed post Covid-19 immunization for 15 minutes without incident. She was provided with Vaccine Information Sheet and instruction to access the V-Safe system.   Dana Thornton was instructed to call 911 with any severe reactions post vaccine: Marland Kitchen Difficulty breathing  . Swelling of face and throat  . A fast heartbeat  . A bad rash all over body  . Dizziness and weakness   Immunizations Administered    Name Date Dose VIS Date Route   Pfizer COVID-19 Vaccine 12/02/2019 11:30 AM 0.3 mL 08/21/2019 Intramuscular   Manufacturer: [employer], [employer]   Lot: [id number]   NDC: [address]-0

## 2019-12-03 ENCOUNTER — Encounter: Payer: Self-pay | Admitting: Family Medicine

## 2020-02-22 ENCOUNTER — Encounter: Payer: 59 | Attending: Internal Medicine | Admitting: Nutrition

## 2020-02-22 ENCOUNTER — Other Ambulatory Visit: Payer: Self-pay

## 2020-02-22 DIAGNOSIS — R739 Hyperglycemia, unspecified: Secondary | ICD-10-CM | POA: Insufficient documentation

## 2020-02-23 NOTE — Progress Notes (Signed)
Patient was identified by name and DOB.  She reports that she is here to reivew her diet and loose weight.. Medication: Insulin: Novolog: 13u immediately before meals.  She does not adjust this dose based on the amount of carbs eaten.  Levemir 18u.  She does not adjust this dose as well Exercise: was going to the gym before Covid, but for last 15 months has done no exercise at home.  Is willing to return to the gym and start back exercising. SBGM:  Testing 1-2 times per days.  Did not bring meter. Recall dictates that FBSs: 110-160.  Says today FBS was 110.  Occasionally acL: 120s-130s, acS: 120-140, HS: (2-3hr. PcS): 160s-170s.  Diet History: (see DM assessment)  Each meal varies daily in amount of carbs, yet taking the same dose of insulin.   Patient reports that she has reduced the amount of carbs in her diet, and feels that this has helped with her blood sugar control, but no weight loss. Suggestions/ 1. Discussed the idea of a dose of Novolog dependent on the amount of carbs/fats eaten, and the amount of activity after the meal.  And the need to adjust dose of Novolog for better blood sugar control as well as weight. 2.  Showed her the meals higher in carbs vs the those that are not, and how she can reduce the novolog dose by 1-3u when needed. 3.  Stressed activity as necessary for weight control and the need to reduce the Novolog dose before a planned activity.  Encouraged 5 days of 30 min. of exercises and the need to reduce the Novolog dose by 2 u .  She reported good understanding of this.   4.  She was given a list of between meal snacks that are lower in fat and carbs, that will reduce her need for so much Novolog before meals.   5.  She was shown the need to test blood sugar ac meals to determine if the dose of insulin taken prior to that was correct.   6.  Stressed need for less fat in diet, which is her main calorie source, and offered other lower fat choices of foods. 7.  We discussed  portions sizes of food groups/snacks for 1500 calorie diet and she was given a handout on this.  She reported good understanding of the need for this amount of food,and the goal for 1 pound of weight loss/ week, if about diet and exercise goals are achieved.   She was grateful for all of the above suggestions and will return in one month for weight and blood sugar evaluation. She was encouraged to call Dr. Daune Perch office if blood sugars drop, so that she can reduce the dose of Novolog and Levemir, despite her confirmation that she understands which insulin doses to reduce, and by how much if low blood sugars occur.

## 2020-02-26 NOTE — Patient Instructions (Addendum)
Exercise for 30-40 min. 5-6 days/week Follow 1500 calorie diet given Limit fat servings to 3 per day Reduce high fat snacks like Nabs/breakfast bars, and switch to lower calorie snacks in list given Test blood sugars before meals and at bedtime, to determine if Novolog dose is correct for reduced calorie eating Call if questions Call Dr. Daune Perch office if having low blood sugars Adjust Novolog doses before meals by 1-3u depending on amount of carbs and fat in the meals.

## 2020-03-16 ENCOUNTER — Other Ambulatory Visit: Payer: Self-pay | Admitting: Orthopedic Surgery

## 2020-03-16 DIAGNOSIS — M25511 Pain in right shoulder: Secondary | ICD-10-CM

## 2020-03-19 ENCOUNTER — Other Ambulatory Visit: Payer: Self-pay

## 2020-03-19 ENCOUNTER — Ambulatory Visit
Admission: RE | Admit: 2020-03-19 | Discharge: 2020-03-19 | Disposition: A | Discharge status: Home / Self Care | Payer: 59 | Source: Ambulatory Visit | Attending: Orthopedic Surgery | Admitting: Orthopedic Surgery

## 2020-03-19 DIAGNOSIS — M25511 Pain in right shoulder: Secondary | ICD-10-CM

## 2020-03-21 ENCOUNTER — Ambulatory Visit: Payer: 59 | Admitting: Nutrition

## 2020-03-22 ENCOUNTER — Other Ambulatory Visit: Payer: Self-pay

## 2020-03-22 ENCOUNTER — Encounter: Payer: 59 | Attending: Internal Medicine | Admitting: Nutrition

## 2020-03-22 DIAGNOSIS — R739 Hyperglycemia, unspecified: Secondary | ICD-10-CM | POA: Insufficient documentation

## 2020-03-22 DIAGNOSIS — Z794 Long term (current) use of insulin: Secondary | ICD-10-CM

## 2020-03-23 NOTE — Progress Notes (Signed)
Patient was identified by name and DOB.   Patient's weight is down 2.5 pounds in 4 weeks.  She was given much praise for this.  Says that she has reduced portion sizes of meals as well as snacks, limited fats in cooking, high fat meats, using 100 calorie or less snack options at HS or between meals, if hungry.   Insulin dose: She has decreased her Novolog dose ac meals to 8-10u, depending on meal size.  SBGM:  Meter shows FBSs: 87-116, acL: 90s122, acS: 86-150 (depending on if she has a late after noon snack).   Exercise: She has not started exercising, due to right shoulder pain.  She had an MRI yesterday and MD told her last week, to not go to gym until we see what is causing pain.   Suggestions given: 1.  Ask MD if walking can be done, and try to do this for 10-20 minutes 1-2X daily, 5 days/wk.  Suggested that she may need to reduce premeal Novolog dose by 1-2 units if she does this after eating.  She reported good understanding of this.

## 2020-03-25 NOTE — Patient Instructions (Signed)
Start walking for 10-15 min. 2Xdaily 5 days/week, if ok ed by MD Continue to limit snacking to less than 100 calories at HS, and use 50 calorie snack options between meals.

## 2020-08-31 ENCOUNTER — Other Ambulatory Visit: Payer: Self-pay | Admitting: Family Medicine

## 2020-08-31 DIAGNOSIS — Z1231 Encounter for screening mammogram for malignant neoplasm of breast: Secondary | ICD-10-CM

## 2020-10-10 ENCOUNTER — Ambulatory Visit: Payer: 59

## 2020-10-21 ENCOUNTER — Other Ambulatory Visit: Payer: Self-pay

## 2020-10-21 ENCOUNTER — Ambulatory Visit
Admission: RE | Admit: 2020-10-21 | Discharge: 2020-10-21 | Disposition: A | Discharge status: Home / Self Care | Payer: 59 | Source: Ambulatory Visit | Attending: Family Medicine | Admitting: Family Medicine

## 2020-10-21 DIAGNOSIS — Z1231 Encounter for screening mammogram for malignant neoplasm of breast: Secondary | ICD-10-CM

## 2021-01-25 ENCOUNTER — Other Ambulatory Visit: Payer: Self-pay | Admitting: Family Medicine

## 2021-01-25 DIAGNOSIS — E2839 Other primary ovarian failure: Secondary | ICD-10-CM

## 2021-07-28 ENCOUNTER — Other Ambulatory Visit: Payer: Self-pay

## 2021-07-28 ENCOUNTER — Ambulatory Visit
Admission: RE | Admit: 2021-07-28 | Discharge: 2021-07-28 | Disposition: A | Discharge status: Home / Self Care | Payer: Medicare Other | Source: Ambulatory Visit | Attending: Family Medicine | Admitting: Family Medicine

## 2021-07-28 DIAGNOSIS — E2839 Other primary ovarian failure: Secondary | ICD-10-CM

## 2021-09-14 ENCOUNTER — Other Ambulatory Visit: Payer: Self-pay | Admitting: Family Medicine

## 2021-09-14 DIAGNOSIS — Z1231 Encounter for screening mammogram for malignant neoplasm of breast: Secondary | ICD-10-CM

## 2021-10-23 ENCOUNTER — Ambulatory Visit: Payer: Medicare Other

## 2021-11-07 ENCOUNTER — Ambulatory Visit
Admission: RE | Admit: 2021-11-07 | Discharge: 2021-11-07 | Disposition: A | Discharge status: Home / Self Care | Payer: Medicare Other | Source: Ambulatory Visit | Attending: Family Medicine | Admitting: Family Medicine

## 2021-11-07 DIAGNOSIS — Z1231 Encounter for screening mammogram for malignant neoplasm of breast: Secondary | ICD-10-CM

## 2022-04-26 IMAGING — MG MM DIGITAL SCREENING BILAT W/ TOMO AND CAD
6 of 12 series · 6 of 36 positions shown · non-contrast
Comparison: Previous exam(s).

CLINICAL DATA: Screening.

EXAM:
DIGITAL SCREENING BILATERAL MAMMOGRAM WITH TOMOSYNTHESIS AND CAD
TECHNIQUE: Bilateral screening digital craniocaudal and mediolateral oblique
mammograms were obtained. Bilateral screening digital breast
tomosynthesis was performed. The images were evaluated with
computer-aided detection.

[L CC synth-2D]
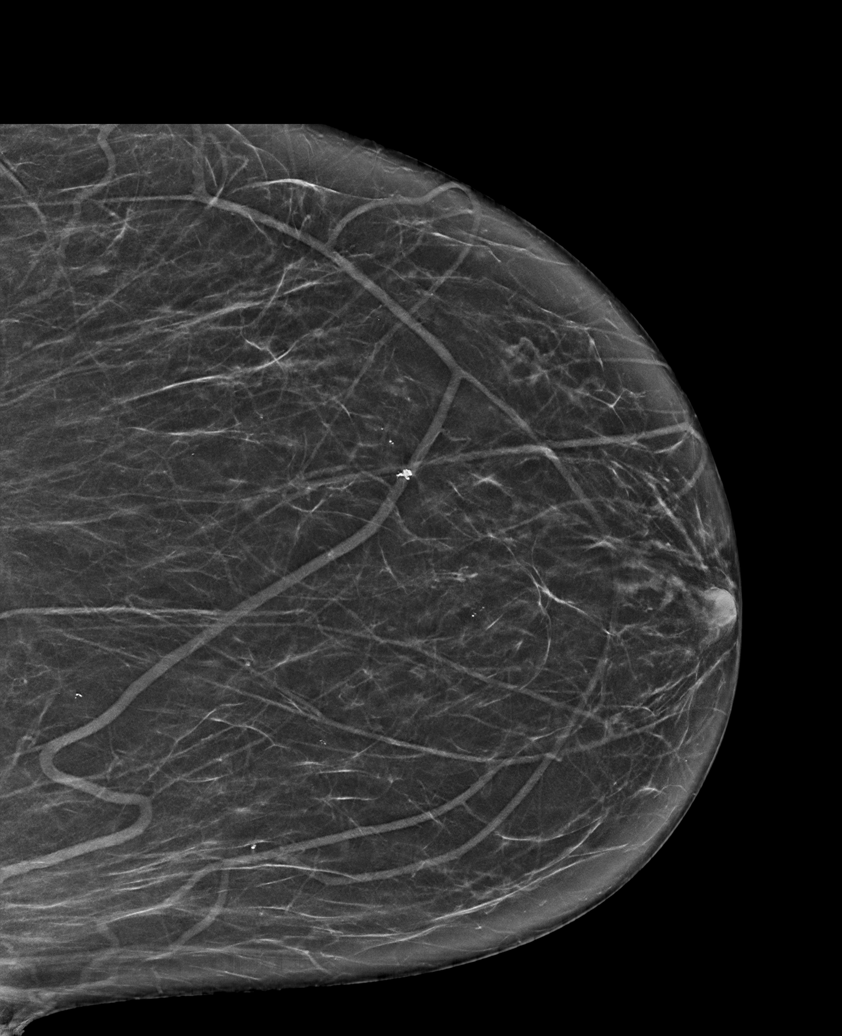

[R CC synth-2D]
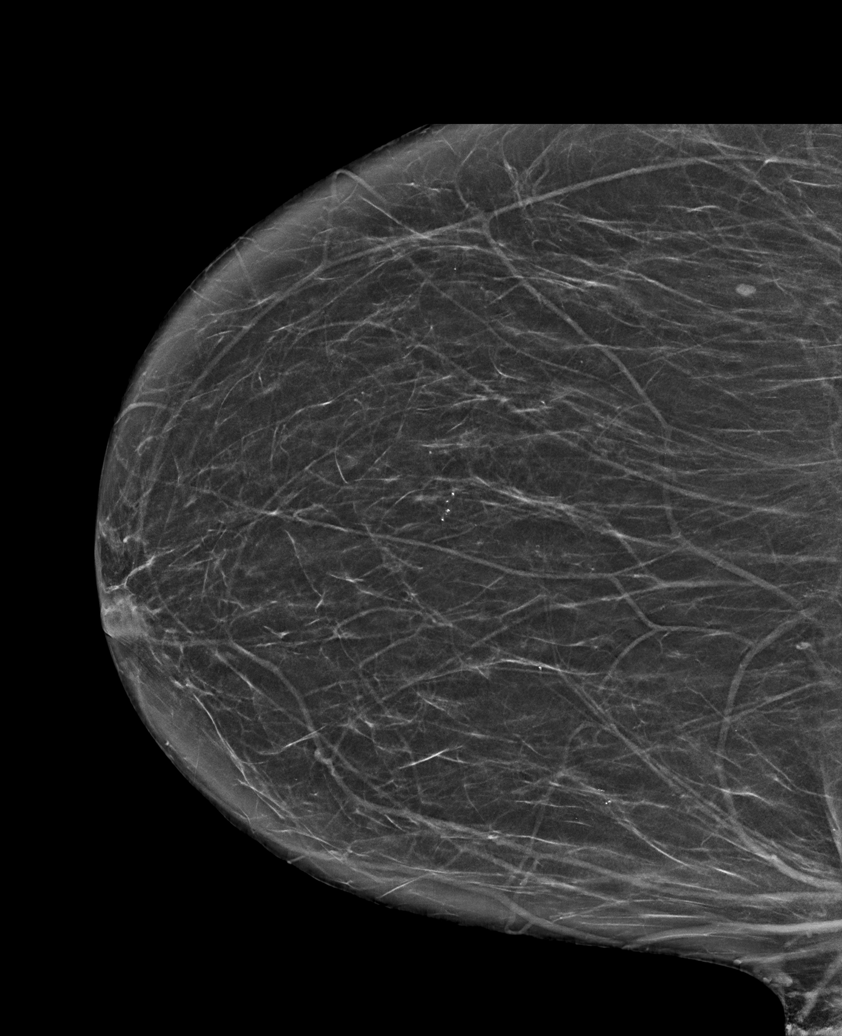

[R MLO synth-2D (1 of 2)]
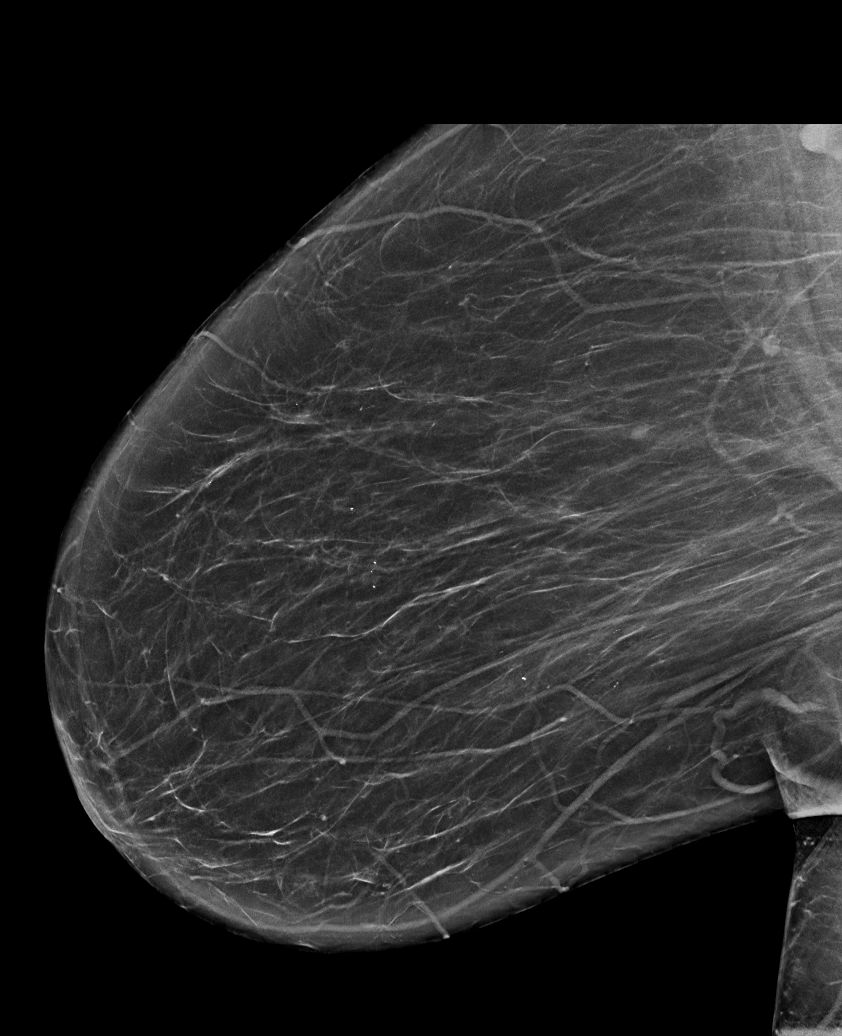

[L MLO synth-2D (1 of 2)]
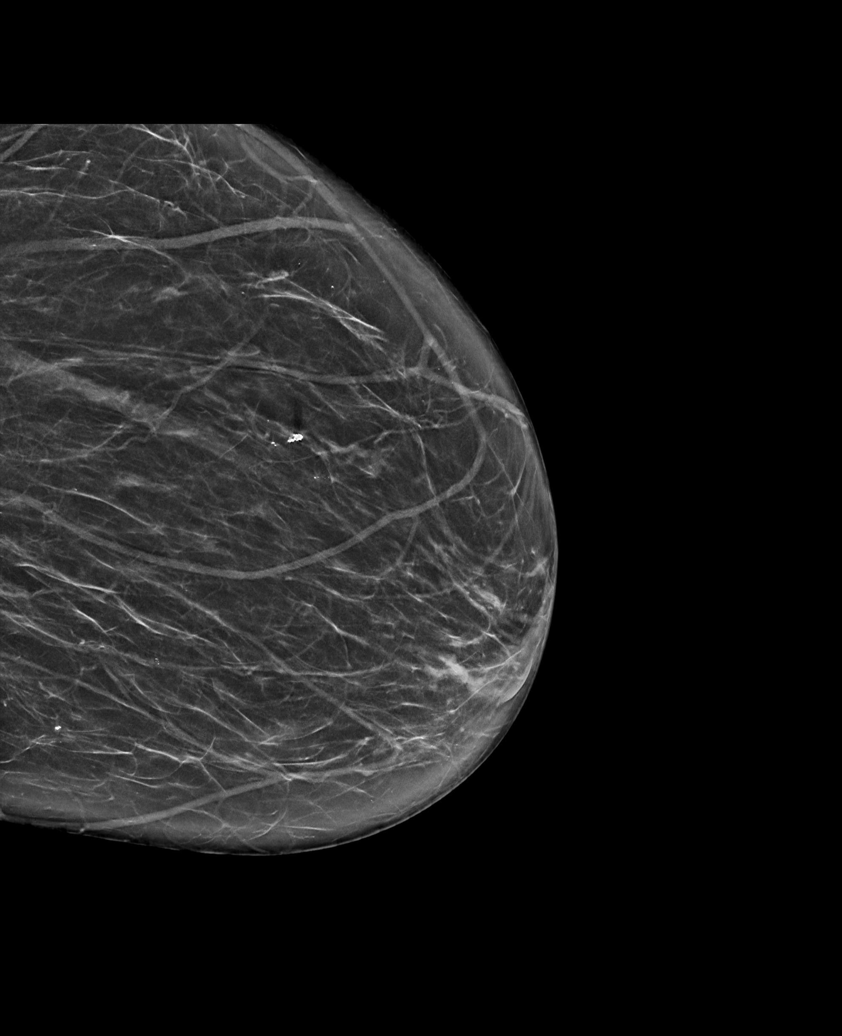

[L MLO synth-2D (2 of 2)]
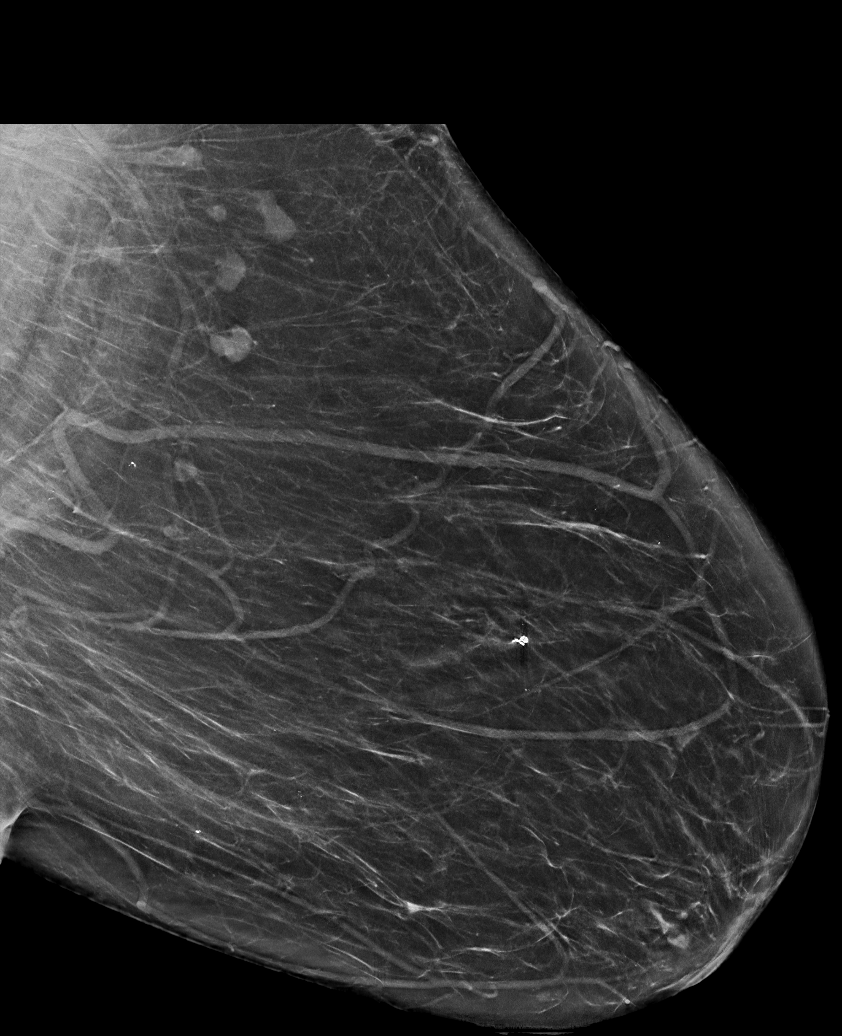

[R MLO synth-2D (2 of 2)]
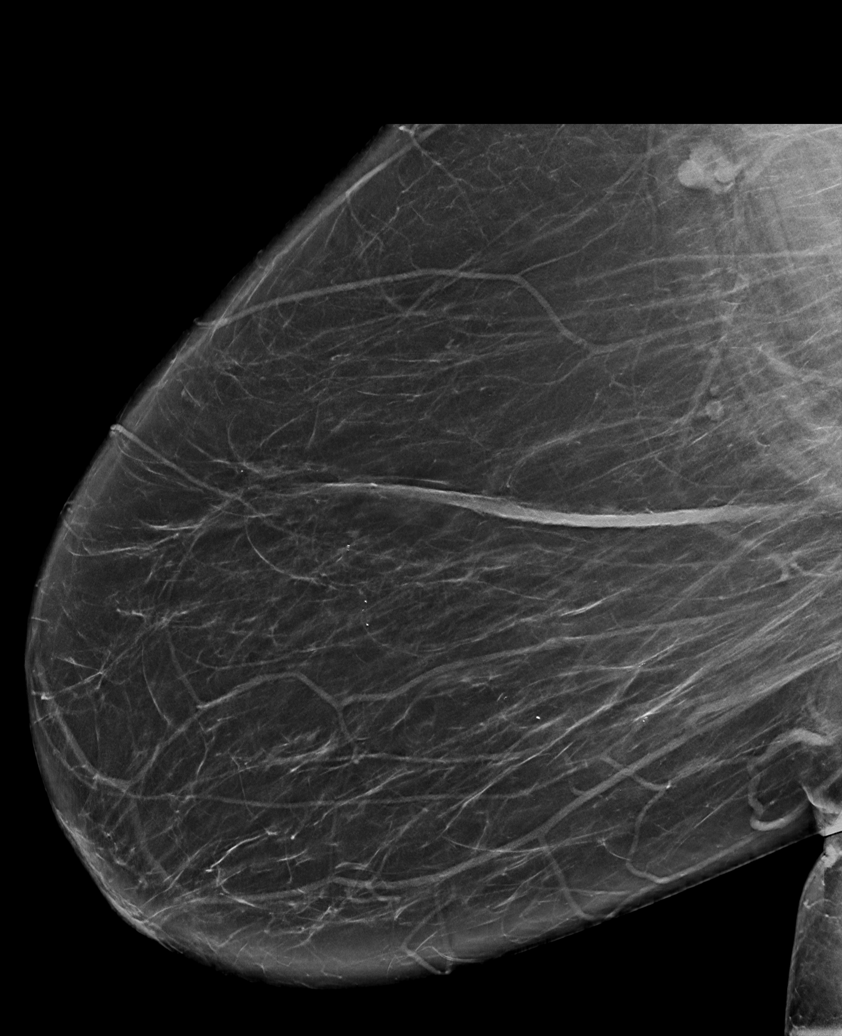

[6 of 36 positions shown; findings below may reference images not displayed]

ACR Breast Density Category b: There are scattered areas of
fibroglandular density.
FINDINGS: There are no findings suspicious for malignancy.
IMPRESSION: No mammographic evidence of malignancy. A result letter of this
screening mammogram will be mailed directly to the patient.

RECOMMENDATION:
Screening mammogram in one year. (Code:51-O-LD2)

BI-RADS CATEGORY  1: Negative.

## 2022-10-04 ENCOUNTER — Other Ambulatory Visit: Payer: Self-pay | Admitting: Internal Medicine

## 2022-10-04 DIAGNOSIS — Z1231 Encounter for screening mammogram for malignant neoplasm of breast: Secondary | ICD-10-CM

## 2022-11-22 ENCOUNTER — Ambulatory Visit
Admission: RE | Admit: 2022-11-22 | Discharge: 2022-11-22 | Disposition: A | Discharge status: Home / Self Care | Payer: Medicare Other | Source: Ambulatory Visit | Attending: Internal Medicine | Admitting: Internal Medicine

## 2022-11-22 DIAGNOSIS — Z1231 Encounter for screening mammogram for malignant neoplasm of breast: Secondary | ICD-10-CM

## 2022-11-27 ENCOUNTER — Other Ambulatory Visit: Payer: Self-pay | Admitting: Family Medicine

## 2022-11-27 DIAGNOSIS — R928 Other abnormal and inconclusive findings on diagnostic imaging of breast: Secondary | ICD-10-CM

## 2022-12-08 ENCOUNTER — Other Ambulatory Visit: Payer: Medicare Other

## 2022-12-17 ENCOUNTER — Ambulatory Visit
Admission: RE | Admit: 2022-12-17 | Discharge: 2022-12-17 | Disposition: A | Discharge status: Home / Self Care | Payer: Medicare Other | Source: Ambulatory Visit | Attending: Family Medicine | Admitting: Family Medicine

## 2022-12-17 ENCOUNTER — Other Ambulatory Visit: Payer: Self-pay | Admitting: Family Medicine

## 2022-12-17 DIAGNOSIS — R928 Other abnormal and inconclusive findings on diagnostic imaging of breast: Secondary | ICD-10-CM

## 2022-12-17 DIAGNOSIS — N6042 Mammary duct ectasia of left breast: Secondary | ICD-10-CM

## 2023-06-18 ENCOUNTER — Other Ambulatory Visit: Payer: Self-pay | Admitting: Internal Medicine

## 2023-06-18 DIAGNOSIS — N6042 Mammary duct ectasia of left breast: Secondary | ICD-10-CM

## 2023-06-20 ENCOUNTER — Ambulatory Visit
Admission: RE | Admit: 2023-06-20 | Discharge: 2023-06-20 | Disposition: A | Discharge status: Home / Self Care | Payer: Medicare Other | Source: Ambulatory Visit | Attending: Family Medicine | Admitting: Family Medicine

## 2023-06-20 DIAGNOSIS — N6042 Mammary duct ectasia of left breast: Secondary | ICD-10-CM

## 2023-09-20 ENCOUNTER — Ambulatory Visit (HOSPITAL_BASED_OUTPATIENT_CLINIC_OR_DEPARTMENT_OTHER)
Admission: RE | Admit: 2023-09-20 | Discharge: 2023-09-20 | Disposition: A | Discharge status: Home / Self Care | Payer: Medicare Other | Source: Ambulatory Visit | Attending: Internal Medicine | Admitting: Internal Medicine

## 2023-09-20 ENCOUNTER — Other Ambulatory Visit: Payer: Self-pay | Admitting: Internal Medicine

## 2023-09-20 DIAGNOSIS — R140 Abdominal distension (gaseous): Secondary | ICD-10-CM | POA: Diagnosis present

## 2023-09-20 DIAGNOSIS — Z7689 Persons encountering health services in other specified circumstances: Secondary | ICD-10-CM | POA: Insufficient documentation

## 2023-09-23 ENCOUNTER — Other Ambulatory Visit: Payer: Medicare Other

## 2023-10-08 ENCOUNTER — Other Ambulatory Visit (HOSPITAL_BASED_OUTPATIENT_CLINIC_OR_DEPARTMENT_OTHER): Payer: Self-pay | Admitting: Gastroenterology

## 2023-10-08 DIAGNOSIS — R1084 Generalized abdominal pain: Secondary | ICD-10-CM

## 2023-10-10 ENCOUNTER — Other Ambulatory Visit: Payer: Self-pay

## 2023-10-10 ENCOUNTER — Encounter (HOSPITAL_COMMUNITY): Payer: Self-pay

## 2023-10-10 ENCOUNTER — Inpatient Hospital Stay (HOSPITAL_COMMUNITY)
Admission: EM | Admit: 2023-10-10 | Discharge: 2023-10-15 | DRG: 392 | Disposition: A | Discharge status: Home / Self Care | Payer: Medicare Other | Attending: Internal Medicine | Admitting: Internal Medicine

## 2023-10-10 ENCOUNTER — Ambulatory Visit (HOSPITAL_COMMUNITY)
Admission: RE | Admit: 2023-10-10 | Discharge: 2023-10-10 | Disposition: A | Discharge status: Home / Self Care | Payer: Medicare Other | Source: Ambulatory Visit | Attending: Gastroenterology | Admitting: Gastroenterology

## 2023-10-10 DIAGNOSIS — K5792 Diverticulitis of intestine, part unspecified, without perforation or abscess without bleeding: Principal | ICD-10-CM

## 2023-10-10 DIAGNOSIS — E7800 Pure hypercholesterolemia, unspecified: Secondary | ICD-10-CM | POA: Diagnosis present

## 2023-10-10 DIAGNOSIS — Z6837 Body mass index (BMI) 37.0-37.9, adult: Secondary | ICD-10-CM | POA: Diagnosis not present

## 2023-10-10 DIAGNOSIS — K5720 Diverticulitis of large intestine with perforation and abscess without bleeding: Principal | ICD-10-CM | POA: Diagnosis present

## 2023-10-10 DIAGNOSIS — E279 Disorder of adrenal gland, unspecified: Secondary | ICD-10-CM | POA: Diagnosis present

## 2023-10-10 DIAGNOSIS — D696 Thrombocytopenia, unspecified: Secondary | ICD-10-CM | POA: Diagnosis present

## 2023-10-10 DIAGNOSIS — E871 Hypo-osmolality and hyponatremia: Secondary | ICD-10-CM | POA: Diagnosis present

## 2023-10-10 DIAGNOSIS — R188 Other ascites: Secondary | ICD-10-CM | POA: Diagnosis present

## 2023-10-10 DIAGNOSIS — E1159 Type 2 diabetes mellitus with other circulatory complications: Secondary | ICD-10-CM | POA: Diagnosis present

## 2023-10-10 DIAGNOSIS — K766 Portal hypertension: Secondary | ICD-10-CM | POA: Diagnosis present

## 2023-10-10 DIAGNOSIS — E119 Type 2 diabetes mellitus without complications: Secondary | ICD-10-CM

## 2023-10-10 DIAGNOSIS — E669 Obesity, unspecified: Secondary | ICD-10-CM | POA: Diagnosis present

## 2023-10-10 DIAGNOSIS — Z888 Allergy status to other drugs, medicaments and biological substances status: Secondary | ICD-10-CM | POA: Diagnosis not present

## 2023-10-10 DIAGNOSIS — I700 Atherosclerosis of aorta: Secondary | ICD-10-CM | POA: Diagnosis present

## 2023-10-10 DIAGNOSIS — K769 Liver disease, unspecified: Secondary | ICD-10-CM

## 2023-10-10 DIAGNOSIS — E872 Acidosis, unspecified: Secondary | ICD-10-CM | POA: Diagnosis present

## 2023-10-10 DIAGNOSIS — R748 Abnormal levels of other serum enzymes: Secondary | ICD-10-CM

## 2023-10-10 DIAGNOSIS — Z794 Long term (current) use of insulin: Secondary | ICD-10-CM | POA: Diagnosis not present

## 2023-10-10 DIAGNOSIS — E1122 Type 2 diabetes mellitus with diabetic chronic kidney disease: Secondary | ICD-10-CM | POA: Diagnosis present

## 2023-10-10 DIAGNOSIS — K5780 Diverticulitis of intestine, part unspecified, with perforation and abscess without bleeding: Secondary | ICD-10-CM

## 2023-10-10 DIAGNOSIS — D649 Anemia, unspecified: Secondary | ICD-10-CM | POA: Diagnosis present

## 2023-10-10 DIAGNOSIS — E039 Hypothyroidism, unspecified: Secondary | ICD-10-CM | POA: Diagnosis present

## 2023-10-10 DIAGNOSIS — K754 Autoimmune hepatitis: Secondary | ICD-10-CM | POA: Diagnosis present

## 2023-10-10 DIAGNOSIS — E278 Other specified disorders of adrenal gland: Secondary | ICD-10-CM | POA: Diagnosis present

## 2023-10-10 DIAGNOSIS — Z841 Family history of disorders of kidney and ureter: Secondary | ICD-10-CM

## 2023-10-10 DIAGNOSIS — E1169 Type 2 diabetes mellitus with other specified complication: Secondary | ICD-10-CM | POA: Diagnosis present

## 2023-10-10 DIAGNOSIS — N179 Acute kidney failure, unspecified: Secondary | ICD-10-CM

## 2023-10-10 DIAGNOSIS — Z79899 Other long term (current) drug therapy: Secondary | ICD-10-CM | POA: Diagnosis not present

## 2023-10-10 DIAGNOSIS — K7460 Unspecified cirrhosis of liver: Secondary | ICD-10-CM | POA: Diagnosis present

## 2023-10-10 DIAGNOSIS — E869 Volume depletion, unspecified: Secondary | ICD-10-CM | POA: Diagnosis present

## 2023-10-10 DIAGNOSIS — K8020 Calculus of gallbladder without cholecystitis without obstruction: Secondary | ICD-10-CM | POA: Diagnosis present

## 2023-10-10 DIAGNOSIS — Z8249 Family history of ischemic heart disease and other diseases of the circulatory system: Secondary | ICD-10-CM | POA: Diagnosis not present

## 2023-10-10 DIAGNOSIS — I8510 Secondary esophageal varices without bleeding: Secondary | ICD-10-CM | POA: Diagnosis present

## 2023-10-10 DIAGNOSIS — Z79890 Hormone replacement therapy (postmenopausal): Secondary | ICD-10-CM

## 2023-10-10 DIAGNOSIS — Z87442 Personal history of urinary calculi: Secondary | ICD-10-CM

## 2023-10-10 DIAGNOSIS — Z7985 Long-term (current) use of injectable non-insulin antidiabetic drugs: Secondary | ICD-10-CM

## 2023-10-10 DIAGNOSIS — R1084 Generalized abdominal pain: Secondary | ICD-10-CM | POA: Insufficient documentation

## 2023-10-10 DIAGNOSIS — Z860100 Personal history of colon polyps, unspecified: Secondary | ICD-10-CM

## 2023-10-10 DIAGNOSIS — Z885 Allergy status to narcotic agent status: Secondary | ICD-10-CM

## 2023-10-10 DIAGNOSIS — R160 Hepatomegaly, not elsewhere classified: Secondary | ICD-10-CM | POA: Diagnosis present

## 2023-10-10 LAB — LIPASE, BLOOD: Lipase: 33 U/L (ref 11–51)

## 2023-10-10 LAB — CBC WITH DIFFERENTIAL/PLATELET
Abs Immature Granulocytes: 0.22 10*3/uL — ABNORMAL HIGH (ref 0.00–0.07)
Basophils Absolute: 0.1 10*3/uL (ref 0.0–0.1)
Basophils Relative: 1 %
Eosinophils Absolute: 0.2 10*3/uL (ref 0.0–0.5)
Eosinophils Relative: 2 %
HCT: 31.9 % — ABNORMAL LOW (ref 36.0–46.0)
Hemoglobin: 11.4 g/dL — ABNORMAL LOW (ref 12.0–15.0)
Immature Granulocytes: 1 %
Lymphocytes Relative: 17 %
Lymphs Abs: 2.7 10*3/uL (ref 0.7–4.0)
MCH: 31.2 pg (ref 26.0–34.0)
MCHC: 35.7 g/dL (ref 30.0–36.0)
MCV: 87.4 fL (ref 80.0–100.0)
Monocytes Absolute: 1.3 10*3/uL — ABNORMAL HIGH (ref 0.1–1.0)
Monocytes Relative: 8 %
Neutro Abs: 11.8 10*3/uL — ABNORMAL HIGH (ref 1.7–7.7)
Neutrophils Relative %: 71 %
Platelets: 145 10*3/uL — ABNORMAL LOW (ref 150–400)
RBC: 3.65 MIL/uL — ABNORMAL LOW (ref 3.87–5.11)
RDW: 16.1 % — ABNORMAL HIGH (ref 11.5–15.5)
WBC: 16.4 10*3/uL — ABNORMAL HIGH (ref 4.0–10.5)
nRBC: 0.1 % (ref 0.0–0.2)

## 2023-10-10 LAB — URINALYSIS, ROUTINE W REFLEX MICROSCOPIC
Glucose, UA: NEGATIVE mg/dL
Hgb urine dipstick: NEGATIVE
Ketones, ur: NEGATIVE mg/dL
Leukocytes,Ua: NEGATIVE
Nitrite: NEGATIVE
Protein, ur: NEGATIVE mg/dL
Specific Gravity, Urine: 1.045 — ABNORMAL HIGH (ref 1.005–1.030)
pH: 5 (ref 5.0–8.0)

## 2023-10-10 LAB — COMPREHENSIVE METABOLIC PANEL
ALT: 109 U/L — ABNORMAL HIGH (ref 0–44)
AST: 172 U/L — ABNORMAL HIGH (ref 15–41)
Albumin: <1.5 g/dL — ABNORMAL LOW (ref 3.5–5.0)
Alkaline Phosphatase: 112 U/L (ref 38–126)
Anion gap: 5 (ref 5–15)
BUN: 25 mg/dL — ABNORMAL HIGH (ref 8–23)
CO2: 21 mmol/L — ABNORMAL LOW (ref 22–32)
Calcium: 7.6 mg/dL — ABNORMAL LOW (ref 8.9–10.3)
Chloride: 105 mmol/L (ref 98–111)
Creatinine, Ser: 2.03 mg/dL — ABNORMAL HIGH (ref 0.44–1.00)
GFR, Estimated: 26 mL/min — ABNORMAL LOW (ref >60)
Glucose, Bld: 89 mg/dL (ref 70–99)
Potassium: 4.6 mmol/L (ref 3.5–5.1)
Sodium: 131 mmol/L — ABNORMAL LOW (ref 135–145)
Total Bilirubin: 3.7 mg/dL — ABNORMAL HIGH (ref 0.0–1.2)
Total Protein: 9.3 g/dL — ABNORMAL HIGH (ref 6.5–8.1)

## 2023-10-10 LAB — POCT I-STAT CREATININE: Creatinine, Ser: 2.1 mg/dL — ABNORMAL HIGH (ref 0.44–1.00)

## 2023-10-10 LAB — PROTIME-INR
INR: 2.1 — ABNORMAL HIGH (ref 0.8–1.2)
Prothrombin Time: 23.7 s — ABNORMAL HIGH (ref 11.4–15.2)

## 2023-10-10 LAB — I-STAT CG4 LACTIC ACID, ED
Lactic Acid, Venous: 2 mmol/L (ref 0.5–1.9)
Lactic Acid, Venous: 2.4 mmol/L (ref 0.5–1.9)

## 2023-10-10 LAB — GLUCOSE, CAPILLARY: Glucose-Capillary: 91 mg/dL (ref 70–99)

## 2023-10-10 LAB — CBG MONITORING, ED: Glucose-Capillary: 71 mg/dL (ref 70–99)

## 2023-10-10 MED ORDER — HEPARIN SODIUM (PORCINE) 5000 UNIT/ML IJ SOLN
5000.0000 [IU] | Freq: Three times a day (TID) | INTRAMUSCULAR | Status: DC
Start: 2023-10-10 — End: 2023-10-10

## 2023-10-10 MED ORDER — PIPERACILLIN-TAZOBACTAM 3.375 G IVPB
3.3750 g | Freq: Three times a day (TID) | INTRAVENOUS | Status: DC
  Administered 2023-10-11 – 2023-10-13 (×7): 3.375 g via INTRAVENOUS
  Filled 2023-10-10 (×8): qty 50

## 2023-10-10 MED ORDER — HYDROMORPHONE HCL 1 MG/ML IJ SOLN
1.0000 mg | Freq: Once | INTRAMUSCULAR | Status: AC
  Administered 2023-10-10: 1 mg via INTRAVENOUS
  Filled 2023-10-10: qty 1

## 2023-10-10 MED ORDER — LACTATED RINGERS IV BOLUS
1000.0000 mL | Freq: Once | INTRAVENOUS | Status: AC
  Administered 2023-10-10: 1000 mL via INTRAVENOUS

## 2023-10-10 MED ORDER — IOHEXOL 350 MG/ML SOLN
60.0000 mL | Freq: Once | INTRAVENOUS | Status: AC | PRN
  Administered 2023-10-10: 60 mL via INTRAVENOUS

## 2023-10-10 MED ORDER — DEXTROSE 50 % IV SOLN
12.5000 g | INTRAVENOUS | Status: AC
  Filled 2023-10-10: qty 50

## 2023-10-10 MED ORDER — ONDANSETRON HCL 4 MG/2ML IJ SOLN
4.0000 mg | Freq: Four times a day (QID) | INTRAMUSCULAR | Status: DC | PRN
  Administered 2023-10-10 – 2023-10-11 (×3): 4 mg via INTRAVENOUS
  Filled 2023-10-10 (×4): qty 2

## 2023-10-10 MED ORDER — INSULIN ASPART 100 UNIT/ML IJ SOLN
0.0000 [IU] | INTRAMUSCULAR | Status: DC
  Administered 2023-10-13: 1 [IU] via SUBCUTANEOUS
  Administered 2023-10-13: 2 [IU] via SUBCUTANEOUS
  Filled 2023-10-10: qty 0.09

## 2023-10-10 MED ORDER — ACETAMINOPHEN 650 MG RE SUPP: 650.0000 mg | Freq: Four times a day (QID) | RECTAL | Status: DC | PRN

## 2023-10-10 MED ORDER — PIPERACILLIN-TAZOBACTAM 3.375 G IVPB
3.3750 g | Freq: Once | INTRAVENOUS | Status: AC
  Administered 2023-10-10: 3.375 g via INTRAVENOUS
  Filled 2023-10-10: qty 50

## 2023-10-10 MED ORDER — LEVOTHYROXINE SODIUM 150 MCG PO TABS
150.0000 ug | ORAL_TABLET | ORAL | Status: DC
  Administered 2023-10-12 – 2023-10-13 (×2): 150 ug via ORAL
  Filled 2023-10-10: qty 1
  Filled 2023-10-10: qty 2
  Filled 2023-10-10: qty 1

## 2023-10-10 MED ORDER — ONDANSETRON HCL 4 MG/2ML IJ SOLN
4.0000 mg | Freq: Once | INTRAMUSCULAR | Status: AC
  Administered 2023-10-10: 4 mg via INTRAVENOUS
  Filled 2023-10-10: qty 2

## 2023-10-10 MED ORDER — LACTATED RINGERS IV SOLN: INTRAVENOUS | Status: DC

## 2023-10-10 MED ORDER — HYDROMORPHONE HCL 1 MG/ML IJ SOLN
0.5000 mg | INTRAMUSCULAR | Status: DC | PRN
  Administered 2023-10-10 – 2023-10-12 (×4): 0.5 mg via INTRAVENOUS
  Filled 2023-10-10 (×2): qty 0.5
  Filled 2023-10-10: qty 1
  Filled 2023-10-10 (×2): qty 0.5

## 2023-10-10 MED ORDER — LEVOTHYROXINE SODIUM 150 MCG PO TABS
300.0000 ug | ORAL_TABLET | ORAL | Status: DC
  Administered 2023-10-14 – 2023-10-15 (×2): 300 ug via ORAL
  Filled 2023-10-10 (×3): qty 4
  Filled 2023-10-10 (×3): qty 2

## 2023-10-10 MED ORDER — MORPHINE SULFATE (PF) 2 MG/ML IV SOLN
2.0000 mg | Freq: Once | INTRAVENOUS | Status: AC
  Administered 2023-10-10: 2 mg via INTRAVENOUS
  Filled 2023-10-10: qty 1

## 2023-10-10 NOTE — H&P (Addendum)
History and Physical    Dana Thornton ZOX:[id number] DOB: [birth year] DOA: 10/10/2023  PCP: Ollen Bowl, MD  Patient coming from: Home  I have personally briefly reviewed patient's old medical records in [facility]  Chief Complaint: Abdominal pain, abnormal CT abdomen  HPI: Dana Thornton is a 68 y.o. 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  Patient states that for the last few weeks she has been having abdominal discomfort, mostly in the left lower quadrant.  She has noticed that she has some increased swelling of her abdomen.  She had an ultrasound performed 09/20/2023 on an outpatient basis that showed small volume ascites.  She was started on prn diuretics of Lasix 40 mg and Aldactone 25 mg both daily as needed for swelling.  Patient states that she has been having loose nonbloody stools during this time.  She has a history of autoimmune hepatitis and has been taking mercaptopurine 50 mg daily (previous prescription was for 150 mg daily).  She says she was also recently started on a course of prednisone 10 mg daily, she had been off of steroids completely for years prior to this being started.  She was referred to the [facility] Dr. Bosie Clos who ordered a CT abdomen/pelvis with contrast.  This was performed earlier today with several findings:  IMPRESSION: 1. Progressive cirrhotic changes involving the liver with associated portal venous hypertension, portal venous collaterals, paraesophageal varices, mesenteric edema and small volume ascites. 2. Heterogeneously enhancing 4 x 4 cm right hepatic lobe mass in segment 6. Findings highly suspicious for hepatoma. MRI abdomen without and with contrast is recommended for more definitive evaluation. 3. Heterogeneously  enhancing right adrenal gland mass measures 3.8 x 2.2 cm. Findings are concerning for metastatic disease. 4. Cholelithiasis with marked gallbladder wall thickening. This could reflect acute cholecystitis or changes related to cirrhosis. Right upper quadrant ultrasound may be helpful for further evaluation. 5. Colonic diverticulosis with focal area of acute perforated diverticulitis involving the sigmoid colon. Multiple small locules of gas and significant surrounding inflammatory changes. No discrete drainable abscess. 6. Poor excretion of contrast through the kidneys on the delayed images. Recommend correlation with renal function. 7. Age advanced atherosclerotic calcification involving the aorta, iliac arteries and branch vessels. 8. Aortic atherosclerosis.  Patient was called with CT results and advised to come to the ED for further management.  Patient reports ongoing discomfort in her left lower abdomen.  She is nauseous and actively retching.  She has been having chills but denies subjective fevers or diaphoresis.  She denies chest pain or dyspnea.  She has not noticed any significant swelling in her lower extremities.  ED Course  Labs/Imaging on admission: I have personally reviewed following labs and imaging studies.  Initial vitals showed BP 107/68, pulse 94, RR 12, temp 97.5 F, SpO2 99% on room air.  Labs showed sodium 131, potassium 4.6, bicarb 21, BUN 25, creatinine 2.03, albumin <1.5, calcium 7.6, AST 172, ALT 109, alk phos 112, total bilirubin 3.7 (hemolysis may be affecting potassium and liver enzyme results).  Lipase 33, lactic acid 2.0.  WBC 16.4, hemoglobin 11.4, platelets 145,000.  UA negative for UTI.  Patient was given 2 L LR, IV morphine and Dilaudid, IV Zosyn.  EDP discussed with general surgery (Dr. Carolynne Edouard) whose team will see in the morning, [facility] (  Dr. Lorenso Quarry) who is consulting.  The hospitalist service was consulted to admit for further evaluation and  management.  Review of Systems: All systems reviewed and are negative except as documented in history of present illness above.   Past Medical History:  Diagnosis Date   Hypercholesterolemia    Hypertension    Kidney stones    Type II diabetes mellitus (HCC)     Past Surgical History:  Procedure Laterality Date   BREAST EXCISIONAL BIOPSY Left    CESAREAN SECTION  1999   DILATION AND CURETTAGE OF UTERUS     "I lost a baby"   IR RADIOLOGIST EVAL & MGMT  11/21/2017   IR RADIOLOGIST EVAL & MGMT  12/03/2017   TUBAL LIGATION  1999    Social History:  reports that she has never smoked. She has never used smokeless tobacco. She reports that she does not drink alcohol and does not use drugs.  Allergies  Allergen Reactions   Ace Inhibitors Cough   Codeine Nausea And Vomiting and Other (See Comments)    GI upset   Statins Other (See Comments)    contraindicated due to liver disease    Family History  Problem Relation Age of Onset   Kidney failure Father    Hypertension Father      Prior to Admission medications   Medication Sig Start Date End Date Taking? Authorizing Provider  ALEVE 220 MG tablet Take 220-440 mg by mouth 2 (two) times daily as needed (for pain or headaches).   Yes [provider]  amLODipine (NORVASC) 10 MG tablet Take 1 tablet (10 mg total) by mouth daily. 11/17/17  Yes Emokpae, Courage, MD  carvedilol (COREG) 25 MG tablet Take 25 mg by mouth 2 (two) times daily with a meal.   Yes [provider]  ezetimibe (ZETIA) 10 MG tablet Take 10 mg by mouth daily.   Yes [provider]  furosemide (LASIX) 40 MG tablet Take 40 mg by mouth daily as needed for fluid or edema.   Yes [provider]  insulin lispro (HUMALOG KWIKPEN) 100 UNIT/ML KwikPen Inject 10 Units into the skin 3 (three) times daily with meals as needed (AS DIRECTED FOR AN ELEVATED BGL).   Yes [provider]  levothyroxine (SYNTHROID) 300 MCG tablet Take  150-300 mcg by mouth See admin instructions. Take 150 mcg by mouth 30 minutes before breakfast on Sunday and Saturday and 300 mcg on Mon/Tues/Wed/Thurs/Fri   Yes [provider]  losartan (COZAAR) 100 MG tablet Take 100 mg by mouth daily.   Yes [provider]  ondansetron (ZOFRAN) 4 MG tablet Take 4 mg by mouth every 6 (six) hours as needed for nausea or vomiting.   Yes [provider]  predniSONE (DELTASONE) 10 MG tablet Take 2 tablets (20 mg) daily with food for 4 days, then 1 tablet (10 mg) daily with food until seen by your GI doctor Patient taking differently: Take 10 mg by mouth daily with breakfast. 11/17/17  Yes Emokpae, Courage, MD  spironolactone (ALDACTONE) 25 MG tablet Take 25 mg by mouth daily as needed (AS DIRECTED).   Yes [provider]  triamcinolone cream (KENALOG) 0.1 % Apply 1 Application topically 2 (two) times daily as needed (for irritation, as directed- affected area).   Yes [provider]  TRULICITY 0.75 MG/0.5ML SOAJ Inject 0.75 mg into the skin every Sunday.   Yes [provider]  Vitamin D, Ergocalciferol, (DRISDOL) 50000 units CAPS capsule Take 50,000 Units  by mouth every Sunday.   Yes [provider]  acetaminophen (TYLENOL) 325 MG tablet Take 2 tablets (650 mg total) by mouth every 6 (six) hours as needed for mild pain, moderate pain, fever or headache. Patient not taking: Reported on 10/10/2023 11/17/17   Shon Hale, MD  albuterol (PROVENTIL HFA;VENTOLIN HFA) 108 (90 Base) MCG/ACT inhaler Inhale 2 puffs into the lungs every 4 (four) hours as needed for wheezing or shortness of breath. Patient not taking: Reported on 10/10/2023 11/17/17   Shon Hale, MD  carvedilol (COREG) 12.5 MG tablet Take 1 tablet (12.5 mg total) by mouth 2 (two) times daily with a meal. Patient not taking: Reported on 10/10/2023 11/17/17   Shon Hale, MD  insulin aspart (NOVOLOG) 100 UNIT/ML injection Before each meal 3  times a day, 120-150 - 2 units, 151-200 - 4 units, 201-250 - 8 units, 251-300 - 10 units,  301 or above 10 units and call your MD Patient not taking: Reported on 10/10/2023 12/10/11   Leroy Sea, MD  insulin glargine (LANTUS) 100 UNIT/ML injection 30  Units sq Qhs and 12 units q am Patient taking differently: Inject 10 Units into the skin at bedtime as needed (for elevated BGL). 11/17/17   Shon Hale, MD  lactobacillus acidophilus & bulgar (LACTINEX) chewable tablet Chew 1 tablet by mouth 3 (three) times daily with meals. Patient not taking: Reported on 10/10/2023 11/17/17   Shon Hale, MD  levothyroxine (SYNTHROID, LEVOTHROID) 150 MCG tablet Take 1 tablet (150 mcg total) by mouth daily before breakfast. Patient not taking: Reported on 10/10/2023 04/13/15   Osvaldo Shipper, MD  mercaptopurine (PURINETHOL) 50 MG tablet Take 150 mg by mouth daily. Give on an empty stomach 1 hour before or 2 hours after meals. Caution: Chemotherapy.    [provider]  oxyCODONE (ROXICODONE) 5 MG immediate release tablet Take 1 tablet (5 mg total) by mouth every 4 (four) hours as needed for severe pain. 11/17/17   Shon Hale, MD  polyethylene glycol (MIRALAX / GLYCOLAX) packet Take 17 g by mouth daily. As needed for constipation Patient not taking: Reported on 10/10/2023 11/18/17   Shon Hale, MD  Spacer/Aero-Holding Chambers (BREATHERITE COLL SPACER ADULT) MISC 1 Device by Does not apply route every 6 (six) hours as needed. 07/03/17   Rhetta Mura, MD    Physical Exam: Vitals:   10/10/23 1540 10/10/23 1617 10/10/23 1800 10/10/23 1815  BP: 107/68  110/67 115/64  Pulse: 94  86 83  Resp: 12   12  Temp: (!) 97.5 F (36.4 C)     TempSrc: Oral     SpO2: 99%  99% 98%  Weight: 92.1 kg 92 kg    Height: 5\' 2"  (1.575 m) 5\' 2"  (1.575 m)     Constitutional: Resting in bed, appears fatigued, nauseous and actively retching Eyes: EOMI, lids and conjunctivae normal ENMT: Mucous  membranes are dry. Posterior pharynx clear of any exudate or lesions.Normal dentition.  Neck: normal, supple, no masses. Respiratory: Inspiratory crackle left lung base otherwise clear to auscultation. Normal respiratory effort. No accessory muscle use.  Cardiovascular: Regular rate and rhythm, no murmurs / rubs / gallops. No extremity edema. 2+ pedal pulses. Abdomen: LLQ tenderness, no masses palpated.  Musculoskeletal: no clubbing / cyanosis. No joint deformity upper and lower extremities. Good ROM, no contractures. Normal muscle tone.  Skin: no rashes, lesions, ulcers. No induration Neurologic: Sensation intact. Strength 5/5 in all 4.  Psychiatric: Normal judgment and insight. Alert and oriented x 3.  Normal mood.   EKG: Not performed.  Assessment/Plan Principal Problem:   Perforation of sigmoid colon due to diverticulitis Active Problems:   AKI (acute kidney injury) (HCC)   Hepatic cirrhosis (HCC)   Liver mass, right lobe   Autoimmune hepatitis (HCC)   Insulin dependent type 2 diabetes mellitus (HCC)   Hypertension associated with diabetes (HCC)   Mass of right adrenal gland (HCC)   Cholelithiasis   Hyponatremia   Normocytic anemia   Hypothyroidism   Hyperlipidemia associated with type 2 diabetes mellitus (HCC)   Dana Thornton is a 68 y.o. 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  Assessment and Plan: Acute sigmoid diverticulitis with perforation: Seen on outpatient CT A/P with multiple small locules of gas and significant surrounding inflammatory changes.  No discrete drainable abscess seen.  WBC 16.4. -General Surgery to consult in a.m. -Keep n.p.o. -Continue IV Zosyn -Continue IV fluid hydration overnight -IV antiemetics  and analgesics as needed  Acute kidney injury: Creatinine 2.03 on admission compared to previous 1.30 on 09/23/2023.  Secondary to volume depletion/GI losses in setting of diverticulitis. -Continue IV fluid hydration overnight -Hold Lasix, spironolactone, NSAIDs  Right hepatic lobe mass Right adrenal gland mass: A heterogeneously enhancing 4 x 4 cm right hepatic lobe mass was seen on CT, highly suspicious for hepatoma.  MRI abdomen w/wo recommended for further evaluation.  Also noted to have a 3.8 x 2.2 cm heterogeneously enhancing right adrenal gland mass concerning for metastatic disease. -Obtain MRI abdomen w/wo once medically stable and renal function improved -[facility] following -AFP tumor marker pending  Hepatic cirrhosis with portal venous hypertension, paraesophageal varices, ascites History of autoimmune hepatitis: Seems to have some recent decompensation with small volume ascites and paraesophageal varices.  She is fatigued but mentating appropriately on admission. -[facility] following -Diuretics on hold as above -Mercaptopurine on for now -GI recommended outpatient EGD to evaluate/screen for esophageal varices  Cholelithiasis with gallbladder wall thickening: Noted on CT imaging.  Suspect likely related to cirrhosis changes rather than acute cholecystitis.  Monitor.  Hyponatremia: Most likely due to volume depletion in setting of GI losses.  Continue IV fluid hydration.  Monitor volume status given cirrhosis.  Insulin-dependent type 2 diabetes: Patient states her hemoglobin A1c recently improved from 10.3 to 5.1.  She is on Trulicity and using insulin only as needed now at home. -Placed on sensitive SSI q4h while NPO -Hypoglycemia protocol, can switch fluids to D5-LR if hypoglycemic  Hypertension: Antihypertensives on hold due to risk for hypotension in setting of acute illness/n.p.o. status.  Normocytic anemia: Hemoglobin 11.4, no obvious bleeding.   Monitor.  Hypothyroidism: Continue Synthroid.  Hyperlipidemia: Holding Zetia.   DVT prophylaxis: heparin injection 5,000 Units Start: 10/10/23 2200 Code Status: Full code, confirmed with patient on admission Family Communication: Spouse at bedside Disposition Plan: From home, dispo pending clinical progress Consults called: General Surgery, [facility] Severity of Illness: The appropriate patient status for this patient is INPATIENT. Inpatient status is judged to be reasonable and necessary in order to provide the required intensity of service to ensure the patient's safety. The patient's presenting symptoms, physical exam findings, and initial radiographic and laboratory data in the context of their chronic comorbidities is felt to place them at high risk for further clinical deterioration. Furthermore, it is not anticipated that the patient  will be medically stable for discharge from the hospital within 2 midnights of admission.   * I certify that at the point of admission it is my clinical judgment that the patient will require inpatient hospital care spanning beyond 2 midnights from the point of admission due to high intensity of service, high risk for further deterioration and high frequency of surveillance required.Darreld Mclean MD [facility]  If 7PM-7AM, please contact night-coverage [email or web address]  10/10/2023, 8:01 PM

## 2023-10-10 NOTE — Progress Notes (Signed)
ED Pharmacy Antibiotic Sign Off An antibiotic consult was received from an ED provider for Zosyn per pharmacy dosing for perforated diverticulitis. A chart review was completed to assess appropriateness.   The following one time order(s) were placed:  Zosyn 3.375g IV x1  Further antibiotic and/or antibiotic pharmacy consults should be ordered by the admitting provider if indicated.    Thank you for allowing pharmacy to be a part of this patient's care.   Cherylin Mylar, PharmD Clinical Pharmacist  1/30/20254:50 PM

## 2023-10-10 NOTE — Hospital Course (Addendum)
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  Patient has been abdominal pain outpatient for several days therefore was referred to outpatient GI who ended up getting CT scan which showed abnormal findings and eventually referred to the hospital.  During the hospitalization AFP was elevated and MRI was eventually concerning for hepatocellular carcinoma therefore oncology team was consulted.  CT chest without contrast done for staging.  Further workup deferred outpatient.  With conservative management acute diverticulitis slowly resolved along with acute kidney injury.  Today we will discharge the patient in stable condition as she is doing much better.   Assessment & Plan:  Principal Problem:   Perforation of sigmoid colon due to diverticulitis Active Problems:   AKI (acute kidney injury) (HCC)   Hepatic cirrhosis (HCC)   Liver mass, right lobe   Autoimmune hepatitis (HCC)   Insulin dependent type 2 diabetes mellitus (HCC)   Hypertension associated with diabetes (HCC)   Mass of right adrenal gland (HCC)   Cholelithiasis   Hyponatremia   Normocytic anemia   Hypothyroidism   Hyperlipidemia associated with type 2 diabetes mellitus (HCC)    Acute sigmoid diverticulitis with perforation: -This is seen on the CT scan.  Diet as tolerated.  Change antibiotics to Rocephin and Flagyl, will transition to p.o. Augmentin upon discharge, 9 more days have not prescribed    Acute kidney injury: Creatinine 2.03 on admission, baseline creatinine 1.3.  Creatinine peaked 2.69.  Resolved   Right hepatic lobe mass Right adrenal gland mass: A  heterogeneously enhancing 4 x 4 cm right hepatic lobe mass was seen on CT, highly suspicious for hepatoma.  MRI abdomen is concerning for HCC.  Oncology performed CT chest without contrast which did not show any evidence of metastatic disease. -AFP-elevated - MRCP-concerning for HCC.  Oncology aware.  Outpatient follow-up   Hepatic cirrhosis with portal venous hypertension, paraesophageal varices, ascites History of autoimmune hepatitis: No signs of decompensation.  Continue outpatient mercaptopurine.  No further workup indicated per GI team during this hospitalization..   Cholelithiasis with gallbladder wall thickening: Possibly secondary to cirrhosis.  Seen by general surgery   Hyponatremia: Improved Resolved  Lactic acidosis - Improved with IV fluids   Insulin-dependent type 2 diabetes: - Resume home   Hypertension: Stable Currently on hold.  IV as needed   Normocytic anemia: Stay well hemoglobin around 9   Hypothyroidism: On Synthroid   Hyperlipidemia: Resume home with  DVT prophylaxis: SCDs    Code Status: Full Code Family Communication: H husband at bedside and daughter over the phone Status is: Inpatient Remains inpatient appropriate because: Discharge today  Subjective: Patient doing significantly well does not have any complaints.  Denies any abdominal pain.  Tolerating p.o.  She really wishes to go home today.  Examination:  Constitutional: Not in acute distress Respiratory: Clear to auscultation bilaterally Cardiovascular: Normal sinus rhythm, no rubs Abdomen: Nontender nondistended good bowel sounds Musculoskeletal: No edema noted Skin: No rashes seen Neurologic: CN 2-12 grossly intact.  And nonfocal Psychiatric: Normal judgment and insight. Alert and oriented x 3. Normal mood.

## 2023-10-10 NOTE — ED Notes (Signed)
Per Ho Cullens DO hold off on fluid challenge while consult to surgery is done.

## 2023-10-10 NOTE — ED Provider Triage Note (Signed)
Emergency Medicine Provider Triage Evaluation Note  Dana Thornton , a 68 y.o. female  was evaluated in triage.  Pt complains of   Dr. Bosie Clos with GI CT showed perforated diverticulitis.   Review of Systems  Positive: As above Negative: As above  Physical Exam  BP 107/68 (BP Location: Right Leg)   Pulse 94   Temp (!) 97.5 F (36.4 C) (Oral)   Resp 12   Ht 5\' 2"  (1.575 m)   Wt 92.1 kg   SpO2 99%   BMI 37.13 kg/m  Gen:   Awake, no distress   Resp:  Normal effort  MSK:   Moves extremities without difficulty    Medical Decision Making  Medically screening exam initiated at 4:10 PM.  Appropriate orders placed.  Dana Thornton was informed that the remainder of the evaluation will be completed by another provider, this initial triage assessment does not replace that evaluation, and the importance of remaining in the ED until their evaluation is complete.     [name], PA-C 10/10/23 [phone]

## 2023-10-10 NOTE — Progress Notes (Signed)
Pharmacy Antibiotic Note  Dana Thornton is a 68 y.o. female admitted on 10/10/2023 with  an intra-abdominal infection, found to be perforated sigmoid diverticulitis . Pharmacy has been consulted for Zosyn dosing.  Patient received one dose of Zosyn while in the ED 1/30 @1713 .  Plan: - Start Zosyn 3.375g IV q8hrs (extended-interval infusion) - Monitor renal function and overall clinical picture - F/u surgery plans   Height: 5\' 2"  (157.5 cm) Weight: 92 kg (202 lb 13.2 oz) IBW/kg (Calculated) : 50.1  Temp (24hrs), Avg:97.5 F (36.4 C), Min:97.5 F (36.4 C), Max:97.5 F (36.4 C)  Recent Labs  Lab 10/10/23 0830 10/10/23 1642 10/10/23 1721 10/10/23 1921  WBC  --  16.4*  --   --   CREATININE 2.10* 2.03*  --   --   LATICACIDVEN  --   --  2.0* 2.4*    Estimated Creatinine Clearance: 28.4 mL/min (A) (by C-G formula based on SCr of 2.03 mg/dL (H)).    Allergies  Allergen Reactions   Ace Inhibitors Cough   Codeine Nausea And Vomiting and Other (See Comments)    GI upset   Statins Other (See Comments)    contraindicated due to liver disease    Antimicrobials this admission: 1/30 Zosyn >>   Dose adjustments this admission: N/A  Microbiology results: None   Thank you for allowing pharmacy to be a part of this patient's care.  Cherylin Mylar, PharmD Clinical Pharmacist  1/30/20257:57 PM

## 2023-10-10 NOTE — ED Triage Notes (Signed)
Patient sent from PCP. Stated she had a CT this morning at 8am and was told to come to ED for further testing. Stated something looked abnormal with the liver. Feels nauseous. No diarrhea.

## 2023-10-10 NOTE — Consult Note (Signed)
[facility] Gastroenterology Consult  Referring Provider: No ref. provider found Primary Care Physician:  Ollen Bowl, MD Primary Gastroenterologist: [facility]-Dr. 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  Had outpatient CT imaging completed today for generalized abdominal pain with findings of cirrhosis, small ascites, paraesophageal varices, 4 x 4 cm right hepatic lobe mass suspicious for hepatoma, no biliary dilatation, gallbladder wall thickened, sigmoid diverticulitis with perforation, multiple small locules of gas and inflammatory changes.  She was subsequently recommended to present to the emergency department for further evaluation and treatment.  Patient accompanied at bedside by her spouse.  She noted that she has been experiencing pelvic abdominal discomfort for the past week.  Roughly 3 weeks prior she started on diuretic therapy for fluid in abdomen (per chart review Lasix 40 mg p.o. daily as needed and Aldactone 25 mg p.o daily as needed).  She denied fevers prior to admission, she noted having some chills.  Some nausea no vomiting.  Has been having loose nonbloody stools.  No chest pain or shortness of breath.  Takes mercaptopurine for autoimmune hepatitis (mercaptopurine 150 mg p.o. daily).  Labs on presentation showed hemoglobin 11.4, WBC 16.4, platelet 145, lactic acid 2.0, BUN/creatinine 25/2.03, AST/ALT 172/109, alkaline phosphatase 112, total bilirubin 3.7, INR 2.1.  General surgery has been consulted.  She has been started on antibiotic therapy.  Colonoscopy 11/05/2018 for colon cancer screening (Dr. Bosie Clos) showed 3 mm polyp in cecum, 8 mm  polyp in transverse colon, 2 sessile polyps in transverse colon, 7 mm polyp in sigmoid colon, grade 1 internal hemorrhoids.  Pathology showed tubular adenoma x 3, hyperplastic polyp, polypoid colonic mucosa negative for dysplasia.  Recommended to have repeat colonoscopy in 5 years.  No prior EGD per chart review.  Past Medical History:  Diagnosis Date   Hypercholesterolemia    Hypertension    Kidney stones    Type II diabetes mellitus (HCC)    Past Surgical History:  Procedure Laterality Date   BREAST EXCISIONAL BIOPSY Left    CESAREAN SECTION  1999   DILATION AND CURETTAGE OF UTERUS     "I lost a baby"   IR RADIOLOGIST EVAL & MGMT  11/21/2017   IR RADIOLOGIST EVAL & MGMT  12/03/2017   TUBAL LIGATION  1999   Prior to Admission medications   Medication Sig Start Date End Date Taking? Authorizing Provider  ALEVE 220 MG tablet Take 220-440 mg by mouth 2 (two) times daily as needed (for pain or headaches).   Yes [provider]  amLODipine (NORVASC) 10 MG tablet Take 1 tablet (10 mg total) by mouth daily. 11/17/17  Yes Emokpae, Courage, MD  carvedilol (COREG) 25 MG tablet Take 25 mg by mouth 2 (two) times daily with a meal.   Yes [provider]  ezetimibe (ZETIA) 10 MG tablet Take 10 mg by mouth daily.   Yes [provider]  furosemide (LASIX) 40 MG tablet Take 40 mg by mouth daily as needed for fluid or edema.   Yes [provider]  insulin lispro (HUMALOG KWIKPEN) 100 UNIT/ML KwikPen Inject 10 Units into the skin 3 (three) times daily with meals as needed (AS DIRECTED FOR AN ELEVATED BGL).   Yes [provider]  levothyroxine (SYNTHROID) 300 MCG tablet Take 150-300 mcg by mouth See admin instructions. Take 150 mcg by mouth 30 minutes before breakfast on Sunday and Saturday and 300 mcg on Mon/Tues/Wed/Thurs/Fri   Yes [provider]  losartan (COZAAR) 100 MG tablet Take 100 mg by mouth daily.   Yes [provider]  ondansetron  (ZOFRAN) 4 MG tablet Take 4 mg by mouth every 6 (six) hours as needed for nausea or vomiting.   Yes [provider]  predniSONE (DELTASONE) 10 MG tablet Take 2 tablets (20 mg) daily with food for 4 days, then 1 tablet (10 mg) daily with food until seen by your GI doctor Patient taking differently: Take 10 mg by mouth daily with breakfast. 11/17/17  Yes Emokpae, Courage, MD  spironolactone (ALDACTONE) 25 MG tablet Take 25 mg by mouth daily as needed (AS DIRECTED).   Yes [provider]  triamcinolone cream (KENALOG) 0.1 % Apply 1 Application topically 2 (two) times daily as needed (for irritation, as directed- affected area).   Yes [provider]  TRULICITY 0.75 MG/0.5ML SOAJ Inject 0.75 mg into the skin every Sunday.   Yes [provider]  Vitamin D, Ergocalciferol, (DRISDOL) 50000 units CAPS capsule Take 50,000 Units by mouth every Sunday.   Yes [provider]  acetaminophen (TYLENOL) 325 MG tablet Take 2 tablets (650 mg total) by mouth every 6 (six) hours as needed for mild pain, moderate pain, fever or headache. Patient not taking: Reported on 10/10/2023 11/17/17   Shon Hale, MD  albuterol (PROVENTIL HFA;VENTOLIN HFA) 108 (90 Base) MCG/ACT inhaler Inhale 2 puffs into the lungs every 4 (four) hours as needed for wheezing or shortness of breath. Patient not taking: Reported on 10/10/2023 11/17/17   Shon Hale, MD  carvedilol (COREG) 12.5 MG tablet Take 1 tablet (12.5 mg total) by mouth 2 (two) times daily with a meal. Patient not taking: Reported on 10/10/2023 11/17/17   Shon Hale, MD  insulin aspart (NOVOLOG) 100 UNIT/ML injection Before each meal 3 times a day, 120-150 - 2 units, 151-200 - 4 units, 201-250 - 8 units, 251-300 - 10 units,  301 or above 10 units and call your MD Patient not taking: Reported on 10/10/2023 12/10/11   Leroy Sea, MD  insulin glargine (LANTUS) 100 UNIT/ML injection 30  Units sq Qhs and 12 units q am Patient  taking differently: Inject 10 Units into the skin at bedtime as needed (for elevated BGL). 11/17/17   Shon Hale, MD  lactobacillus acidophilus & bulgar (LACTINEX) chewable tablet Chew 1 tablet by mouth 3 (three) times daily with meals. Patient not taking: Reported on 10/10/2023 11/17/17   Shon Hale, MD  levothyroxine (SYNTHROID, LEVOTHROID) 150 MCG tablet Take 1 tablet (150 mcg total) by mouth daily before breakfast. Patient not taking: Reported on 10/10/2023 04/13/15   Osvaldo Shipper, MD  mercaptopurine (PURINETHOL) 50 MG tablet Take 150 mg by mouth daily. Give on an empty stomach 1 hour before or 2 hours after meals. Caution: Chemotherapy.    [provider]  oxyCODONE (ROXICODONE) 5 MG immediate release tablet Take 1 tablet (5 mg total) by mouth every 4 (four) hours as needed for severe pain. 11/17/17   Shon Hale, MD  polyethylene glycol (MIRALAX / GLYCOLAX) packet Take 17 g by mouth daily. As needed for constipation Patient not taking: Reported on 10/10/2023 11/18/17   Shon Hale, MD  Spacer/Aero-Holding Chambers (BREATHERITE COLL SPACER ADULT) MISC 1 Device by Does not apply route every  6 (six) hours as needed. 07/03/17   Rhetta Mura, MD   Current Facility-Administered Medications  Medication Dose Route Frequency Provider Last Rate Last Admin   lactated ringers bolus 1,000 mL  1,000 mL Intravenous Once Tanda Rockers A, DO       morphine (PF) 2 MG/ML injection 2 mg  2 mg Intravenous Once Sloan Leiter, DO       piperacillin-tazobactam (ZOSYN) IVPB 3.375 g  3.375 g Intravenous Once Cherylin Mylar, RPH 12.5 mL/hr at 10/10/23 1713 3.375 g at 10/10/23 1713   Current Outpatient Medications  Medication Sig Dispense Refill   ALEVE 220 MG tablet Take 220-440 mg by mouth 2 (two) times daily as needed (for pain or headaches).     amLODipine (NORVASC) 10 MG tablet Take 1 tablet (10 mg total) by mouth daily. 30 tablet 1   carvedilol (COREG) 25 MG tablet Take 25 mg by  mouth 2 (two) times daily with a meal.     ezetimibe (ZETIA) 10 MG tablet Take 10 mg by mouth daily.     furosemide (LASIX) 40 MG tablet Take 40 mg by mouth daily as needed for fluid or edema.     insulin lispro (HUMALOG KWIKPEN) 100 UNIT/ML KwikPen Inject 10 Units into the skin 3 (three) times daily with meals as needed (AS DIRECTED FOR AN ELEVATED BGL).     levothyroxine (SYNTHROID) 300 MCG tablet Take 150-300 mcg by mouth See admin instructions. Take 150 mcg by mouth 30 minutes before breakfast on Sunday and Saturday and 300 mcg on Mon/Tues/Wed/Thurs/Fri     losartan (COZAAR) 100 MG tablet Take 100 mg by mouth daily.     ondansetron (ZOFRAN) 4 MG tablet Take 4 mg by mouth every 6 (six) hours as needed for nausea or vomiting.     predniSONE (DELTASONE) 10 MG tablet Take 2 tablets (20 mg) daily with food for 4 days, then 1 tablet (10 mg) daily with food until seen by your GI doctor (Patient taking differently: Take 10 mg by mouth daily with breakfast.) 30 tablet 1   spironolactone (ALDACTONE) 25 MG tablet Take 25 mg by mouth daily as needed (AS DIRECTED).     triamcinolone cream (KENALOG) 0.1 % Apply 1 Application topically 2 (two) times daily as needed (for irritation, as directed- affected area).     TRULICITY 0.75 MG/0.5ML SOAJ Inject 0.75 mg into the skin every Sunday.     Vitamin D, Ergocalciferol, (DRISDOL) 50000 units CAPS capsule Take 50,000 Units by mouth every Sunday.     acetaminophen (TYLENOL) 325 MG tablet Take 2 tablets (650 mg total) by mouth every 6 (six) hours as needed for mild pain, moderate pain, fever or headache. (Patient not taking: Reported on 10/10/2023) 100 tablet 0   albuterol (PROVENTIL HFA;VENTOLIN HFA) 108 (90 Base) MCG/ACT inhaler Inhale 2 puffs into the lungs every 4 (four) hours as needed for wheezing or shortness of breath. (Patient not taking: Reported on 10/10/2023) 1 Inhaler 2   carvedilol (COREG) 12.5 MG tablet Take 1 tablet (12.5 mg total) by mouth 2 (two) times  daily with a meal. (Patient not taking: Reported on 10/10/2023) 60 tablet 2   insulin aspart (NOVOLOG) 100 UNIT/ML injection Before each meal 3 times a day, 120-150 - 2 units, 151-200 - 4 units, 201-250 - 8 units, 251-300 - 10 units,  301 or above 10 units and call your MD (Patient not taking: Reported on 10/10/2023) 1 vial 1   insulin glargine (LANTUS) 100 UNIT/ML injection 30  Units sq Qhs and 12 units q am (Patient taking differently: Inject 10 Units into the skin at bedtime as needed (for elevated BGL).) 10 mL 2   lactobacillus acidophilus & bulgar (LACTINEX) chewable tablet Chew 1 tablet by mouth 3 (three) times daily with meals. (Patient not taking: Reported on 10/10/2023) 30 tablet 1   levothyroxine (SYNTHROID, LEVOTHROID) 150 MCG tablet Take 1 tablet (150 mcg total) by mouth daily before breakfast. (Patient not taking: Reported on 10/10/2023) 30 tablet 2   mercaptopurine (PURINETHOL) 50 MG tablet Take 150 mg by mouth daily. Give on an empty stomach 1 hour before or 2 hours after meals. Caution: Chemotherapy.     oxyCODONE (ROXICODONE) 5 MG immediate release tablet Take 1 tablet (5 mg total) by mouth every 4 (four) hours as needed for severe pain. 15 tablet 0   polyethylene glycol (MIRALAX / GLYCOLAX) packet Take 17 g by mouth daily. As needed for constipation (Patient not taking: Reported on 10/10/2023) 14 each 0   Spacer/Aero-Holding Chambers (BREATHERITE COLL SPACER ADULT) MISC 1 Device by Does not apply route every 6 (six) hours as needed. 1 each 0   Allergies as of 10/10/2023 - Review Complete 10/10/2023  Allergen Reaction Noted   Ace inhibitors Cough 10/10/2023   Codeine Nausea And Vomiting and Other (See Comments) 03/28/2015   Statins Other (See Comments) 10/10/2023   Family History  Problem Relation Age of Onset   Kidney failure Father    Hypertension Father    Social History   Socioeconomic History   Marital status: Married    Spouse name: Not on file   Number of children: Not on  file   Years of education: Not on file   Highest education level: Not on file  Occupational History   Not on file  Tobacco Use   Smoking status: Never   Smokeless tobacco: Never  Substance and Sexual Activity   Alcohol use: No   Drug use: No   Sexual activity: Yes    Partners: Male  Other Topics Concern   Not on file  Social History Narrative   Not on file   Social Drivers of Health   Financial Resource Strain: Not on file  Food Insecurity: Not on file  Transportation Needs: Not on file  Physical Activity: Not on file  Stress: Not on file  Social Connections: Not on file  Intimate Partner Violence: Not on file   Review of Systems:  Review of Systems  Constitutional:  Positive for chills.  Respiratory:  Negative for shortness of breath.   Cardiovascular:  Negative for chest pain.  Gastrointestinal:  Positive for abdominal pain, diarrhea, nausea and vomiting. Negative for blood in stool.    OBJECTIVE:   Temp:  [97.5 F (36.4 C)] 97.5 F (36.4 C) (01/30 1540) Pulse Rate:  [94] 94 (01/30 1540) Resp:  [12] 12 (01/30 1540) BP: (107)/(68) 107/68 (01/30 1540) SpO2:  [99 %] 99 % (01/30 1540) Weight:  [92 kg-92.1 kg] 92 kg (01/30 1617)   Physical Exam Constitutional:      General: She is not in acute distress.    Appearance: She is ill-appearing. She is not toxic-appearing or diaphoretic.  HENT:     Mouth/Throat:     Mouth: Mucous membranes are dry.  Cardiovascular:     Rate and Rhythm: Normal rate and regular rhythm.  Pulmonary:     Effort: No respiratory distress.     Breath sounds: Normal breath sounds.     Comments: No supplemental  oxygen in place Abdominal:     General: Bowel sounds are normal. There is no distension.     Palpations: Abdomen is soft.     Tenderness: There is abdominal tenderness (Suprapubic).  Musculoskeletal:     Right lower leg: No edema.     Left lower leg: No edema.  Skin:    General: Skin is warm and dry.  Neurological:     Mental  Status: She is alert.     Labs: Recent Labs    10/10/23 1642  WBC 16.4*  HGB 11.4*  HCT 31.9*  PLT 145*   BMET Recent Labs    10/10/23 0830 10/10/23 1642  NA  --  131*  K  --  4.6  CL  --  105  CO2  --  21*  GLUCOSE  --  89  BUN  --  25*  CREATININE 2.10* 2.03*  CALCIUM  --  7.6*   LFT Recent Labs    10/10/23 1642  PROT 9.3*  ALBUMIN <1.5*  AST 172*  ALT 109*  ALKPHOS 112  BILITOT 3.7*   PT/INR Recent Labs    10/10/23 1642  LABPROT 23.7*  INR 2.1*    Diagnostic imaging: CT ABDOMEN PELVIS W CONTRAST Result Date: 10/10/2023 CLINICAL DATA:  Generalized abdominal pain EXAM: CT ABDOMEN AND PELVIS WITH CONTRAST TECHNIQUE: Multidetector CT imaging of the abdomen and pelvis was performed using the standard protocol following bolus administration of intravenous contrast. RADIATION DOSE REDUCTION: This exam was performed according to the departmental dose-optimization program which includes automated exposure control, adjustment of the mA and/or kV according to patient size and/or use of iterative reconstruction technique. CONTRAST:  60mL OMNIPAQUE IOHEXOL 350 MG/ML SOLN COMPARISON:  CT scan from 2019 and abdominal ultrasound 09/20/2023 FINDINGS: Lower chest: The lung bases are clear of acute process. No pleural effusion or pulmonary lesions. The heart is normal in size. No pericardial effusion. Stable aortic and coronary artery calcifications. The distal esophagus and aorta are unremarkable. Hepatobiliary: Progressive cirrhotic changes involving the liver. Associated portal venous hypertension, portal venous collaterals, paraesophageal varices, mesenteric edema and small volume ascites. There is a heterogeneously enhancing 4 x 4 cm right hepatic lobe mass in segment 6. Findings highly suspicious for hepatoma. MRI abdomen without and with contrast is recommended for more definitive evaluation. No intrahepatic biliary dilatation. The portal and hepatic veins are patent. Calcified  gallstones are noted in the gallbladder. The gallbladder wall is markedly thickened. This could reflect acute cholecystitis or changes related to cirrhosis. Right upper quadrant ultrasound may be helpful for further evaluation. No common bile duct dilatation. Pancreas: Unremarkable. No pancreatic ductal dilatation or surrounding inflammatory changes. Spleen: Normal size.  No focal lesions.  The splenic vein is patent. Adrenals/Urinary Tract: Heterogeneously enhancing right adrenal gland mass measures 3.8 x 2.2 cm. The left adrenal gland is normal. Numerous bilateral nonenhancing renal cysts not requiring any further imaging evaluation or follow-up. Poor excretion of contrast through the kidneys is noted on the delayed images. Recommend correlation with renal function. The bladder is grossly normal. Stomach/Bowel: The stomach, duodenum and small are grossly normal without contrast. No inflammatory changes, mass lesions or obstructive findings. Right colonic wall thickening may be due to adjacent ascites and the patient's cirrhosis. Colonic diverticulosis with focal area acute perforated diverticulitis involving the sigmoid colon. Multiple small locules of gas and significant surrounding inflammatory changes. No discrete drainable abscess. Vascular/Lymphatic: Age advanced atherosclerotic calcification involving the aorta, iliac arteries and branch vessels but no aneurysm.  The major venous structures are patent. Scattered upper abdominal lymph nodes typical with cirrhosis. No retroperitoneal mass or adenopathy. No pelvic adenopathy. Reproductive: The uterus and ovaries are unremarkable. Other: Mesenteric edema, small volume abdominal/pelvic ascites and diffuse body wall edema. Musculoskeletal: No significant findings. Chronic changes of bilateral hip AVN. IMPRESSION: 1. Progressive cirrhotic changes involving the liver with associated portal venous hypertension, portal venous collaterals, paraesophageal varices,  mesenteric edema and small volume ascites. 2. Heterogeneously enhancing 4 x 4 cm right hepatic lobe mass in segment 6. Findings highly suspicious for hepatoma. MRI abdomen without and with contrast is recommended for more definitive evaluation. 3. Heterogeneously enhancing right adrenal gland mass measures 3.8 x 2.2 cm. Findings are concerning for metastatic disease. 4. Cholelithiasis with marked gallbladder wall thickening. This could reflect acute cholecystitis or changes related to cirrhosis. Right upper quadrant ultrasound may be helpful for further evaluation. 5. Colonic diverticulosis with focal area of acute perforated diverticulitis involving the sigmoid colon. Multiple small locules of gas and significant surrounding inflammatory changes. No discrete drainable abscess. 6. Poor excretion of contrast through the kidneys on the delayed images. Recommend correlation with renal function. 7. Age advanced atherosclerotic calcification involving the aorta, iliac arteries and branch vessels. 8. Aortic atherosclerosis. These results will be called to the ordering clinician or representative by the Radiologist Assistant, and communication documented in the PACS or [employer]. Electronically Signed   By: Rudie Meyer M.D.   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 decompensated by ascites and likely paraesophageal varices  -On  mercaptopurine 150 mg PO daily prior to admission  -On furosemide 40 mg PO daily PRN and spironolactone 25 mg PO daily PRN prior to admission Normocytic anemia Right hepatic lobe, 4 x 4 cm mass suspicious for hepatoma  PLAN: -Await General Surgery evaluation, on IV antibiotic therapy  -Recommend MRI with and without contrast enhancement to further characterize liver lesion, defer this imaging pending management of perforated  sigmoid diverticulitis -Check AFP -Hold diuretic therapy for now -When on diet, would recommend 2 gm sodium restriction per day  -Recommend outpatient EGD to evaluate/screen for esophageal varices   LOS: 0 days   Liliane Shi, [facility]

## 2023-10-10 NOTE — ED Provider Notes (Signed)
[place] [facility] DEPARTMENT AT [facility] Provider Note  CSN: [id number] Arrival date & time: 10/10/23 1523  Chief Complaint(s) Abnormal CT  HPI Dana Thornton is a 68 y.o. female with past medical history as below, significant for HLD, HTN, nephrolithiasis, T2DM,  who presents to the ED with complaint of sent by gastroenterologist  She had CT abdomen pelvis this morning Dr. Bosie Clos with progressive cirrhosis, paraesophageal varices, mesenteric edema and ascites.  Patient has hepatic mass, right adrenal gland mass concerning for metastatic disease, cholelithiasis possible concurrent cholecystitis.  Acute perforated diverticulitis without drainable abscess on CT.  Patient reports left lower quadrant and suprapubic abdominal pain over the past week.  Nausea improved with Zofran.  No fevers.  She has had poor appetite.  Feels the pain radiates to her low back.  Pain unrelieved with home analgesic.   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 Authorizing Provider  ALEVE 220 MG tablet Take 220-440 mg by mouth 2 (two) times daily as needed (for pain or headaches).   Yes [provider]  amLODipine (NORVASC) 10 MG tablet Take 1 tablet (10 mg total) by mouth daily. 11/17/17  Yes Emokpae, Courage, MD  carvedilol (COREG) 25 MG tablet Take 25 mg by mouth 2 (two) times  daily with a meal.   Yes [provider]  ezetimibe (ZETIA) 10 MG tablet Take 10 mg by mouth daily.   Yes [provider]  furosemide (LASIX) 40 MG tablet Take 40 mg by mouth daily as needed for fluid or edema.   Yes [provider]  insulin lispro (HUMALOG KWIKPEN) 100 UNIT/ML KwikPen Inject 10 Units into the skin 3 (three) times daily with meals as needed (AS DIRECTED FOR AN ELEVATED BGL).   Yes [provider]  levothyroxine (SYNTHROID) 300 MCG tablet Take 150-300 mcg by mouth See admin instructions. Take 150 mcg by mouth 30 minutes before breakfast on Sunday and Saturday and 300 mcg on Mon/Tues/Wed/Thurs/Fri   Yes [provider]  losartan (COZAAR) 100 MG tablet Take 100 mg by mouth daily.   Yes [provider]  ondansetron (ZOFRAN) 4 MG tablet Take 4 mg by mouth every 6 (six) hours as needed for nausea or vomiting.   Yes [provider]  predniSONE (DELTASONE) 10 MG tablet Take 2 tablets (20 mg) daily with food for 4 days, then 1 tablet (10 mg) daily with food until seen by your GI doctor Patient taking differently: Take 10 mg by mouth daily with breakfast. 11/17/17  Yes Emokpae, Courage, MD  spironolactone (ALDACTONE) 25 MG tablet Take 25 mg by mouth daily as needed (AS DIRECTED).   Yes [provider]  triamcinolone cream (KENALOG) 0.1 % Apply 1 Application topically 2 (two) times daily as needed (for irritation, as directed- affected area).   Yes [provider]  TRULICITY 0.75 MG/0.5ML SOAJ Inject 0.75 mg  into the skin every Sunday.   Yes [provider]  Vitamin D, Ergocalciferol, (DRISDOL) 50000 units CAPS capsule Take 50,000 Units by mouth every Sunday.   Yes [provider]  acetaminophen (TYLENOL) 325 MG tablet Take 2 tablets (650 mg total) by mouth every 6 (six) hours as needed for mild pain, moderate pain, fever or headache. Patient not taking: Reported on 10/10/2023 11/17/17   Shon Hale, MD  albuterol (PROVENTIL HFA;VENTOLIN HFA) 108 (90 Base) MCG/ACT inhaler Inhale 2 puffs into the lungs every 4 (four) hours as needed for wheezing or shortness of breath. Patient not taking: Reported on 10/10/2023 11/17/17   Shon Hale, MD  carvedilol (COREG) 12.5 MG tablet Take 1 tablet (12.5 mg total) by mouth 2 (two) times daily with a meal. Patient not taking: Reported on 10/10/2023 11/17/17   Shon Hale, MD  insulin aspart (NOVOLOG) 100 UNIT/ML injection Before each meal 3 times a day, 120-150 - 2 units, 151-200 - 4 units, 201-250 - 8 units, 251-300 - 10 units,  301 or above 10 units and call your MD Patient not taking: Reported on 10/10/2023 12/10/11   Leroy Sea, MD  insulin glargine (LANTUS) 100 UNIT/ML injection 30  Units sq Qhs and 12 units q am Patient taking differently: Inject 10 Units into the skin at bedtime as needed (for elevated BGL). 11/17/17   Shon Hale, MD  lactobacillus acidophilus & bulgar (LACTINEX) chewable tablet Chew 1 tablet by mouth 3 (three) times daily with meals. Patient not taking: Reported on 10/10/2023 11/17/17   Shon Hale, MD  levothyroxine (SYNTHROID, LEVOTHROID) 150 MCG tablet Take 1 tablet (150 mcg total) by mouth daily before breakfast. Patient not taking: Reported on 10/10/2023 04/13/15   Osvaldo Shipper, MD  mercaptopurine (PURINETHOL) 50 MG tablet Take 150 mg by mouth daily. Give on an empty stomach 1 hour before or 2 hours after meals. Caution: Chemotherapy.    [provider]  oxyCODONE (ROXICODONE) 5 MG immediate release tablet Take 1 tablet (5 mg total) by mouth every 4 (four) hours as needed for severe pain. 11/17/17   Shon Hale, MD  polyethylene glycol (MIRALAX / GLYCOLAX) packet Take 17 g by mouth daily. As needed for constipation Patient not taking: Reported on 10/10/2023 11/18/17   Shon Hale, MD  Spacer/Aero-Holding Chambers (BREATHERITE COLL SPACER ADULT) MISC 1 Device by Does not apply route every  6 (six) hours as needed. 07/03/17   Rhetta Mura, MD                                                                                                                                    Past Surgical History Past Surgical History:  Procedure Laterality Date   BREAST EXCISIONAL BIOPSY Left    CESAREAN SECTION  1999   DILATION AND CURETTAGE OF UTERUS     "I lost a baby"   IR RADIOLOGIST EVAL & MGMT  11/21/2017  IR RADIOLOGIST EVAL & MGMT  12/03/2017   TUBAL LIGATION  1999   Family History Family History  Problem Relation Age of Onset   Kidney failure Father    Hypertension Father     Social History Social History   Tobacco Use   Smoking status: Never   Smokeless tobacco: Never  Substance Use Topics   Alcohol use: No   Drug use: No   Allergies Ace inhibitors, Codeine, and Statins  Review of Systems Review of Systems  Constitutional:  Positive for appetite change. Negative for fatigue.  Respiratory:  Negative for chest tightness.   Cardiovascular:  Negative for chest pain.  Gastrointestinal:  Positive for abdominal pain. Negative for nausea and vomiting.  Musculoskeletal:  Positive for back pain.  Neurological:  Negative for headaches.  All other systems reviewed and are negative.   Physical Exam Vital Signs  I have reviewed the triage vital signs BP 107/68 (BP Location: Right Leg)   Pulse 94   Temp (!) 97.5 F (36.4 C) (Oral)   Resp 12   Ht 5\' 2"  (1.575 m)   Wt 92 kg   SpO2 99%   BMI 37.10 kg/m  Physical Exam Vitals and nursing note reviewed.  Constitutional:      General: She is not in acute distress.    Appearance: Normal appearance.  HENT:     Head: Normocephalic and atraumatic.     Right Ear: External ear normal.     Left Ear: External ear normal.     Nose: Nose normal.     Mouth/Throat:     Mouth: Mucous membranes are moist.  Eyes:     General: No scleral icterus.       Right eye: No discharge.        Left eye: No discharge.   Cardiovascular:     Rate and Rhythm: Normal rate and regular rhythm.     Pulses: Normal pulses.     Heart sounds: Normal heart sounds.  Pulmonary:     Effort: Pulmonary effort is normal. No respiratory distress.     Breath sounds: Normal breath sounds. No stridor.  Abdominal:     General: Abdomen is flat. There is no distension.     Palpations: Abdomen is soft.     Tenderness: There is abdominal tenderness.    Musculoskeletal:     Cervical back: No rigidity.     Right lower leg: No edema.     Left lower leg: No edema.  Skin:    General: Skin is warm and dry.     Capillary Refill: Capillary refill takes less than 2 seconds.  Neurological:     Mental Status: She is alert.  Psychiatric:        Mood and Affect: Mood normal.        Behavior: Behavior normal. Behavior is cooperative.     ED Results and Treatments Labs (all labs ordered are listed, but only abnormal results are displayed) Labs Reviewed  CBC WITH DIFFERENTIAL/PLATELET - Abnormal; Notable for the following components:      Result Value   WBC 16.4 (*)    RBC 3.65 (*)    Hemoglobin 11.4 (*)    HCT 31.9 (*)    RDW 16.1 (*)    Platelets 145 (*)    Neutro Abs 11.8 (*)    Monocytes Absolute 1.3 (*)    Abs Immature Granulocytes 0.22 (*)    All other components within normal limits  COMPREHENSIVE METABOLIC PANEL -  Abnormal; Notable for the following components:   Sodium 131 (*)    CO2 21 (*)    BUN 25 (*)    Creatinine, Ser 2.03 (*)    Calcium 7.6 (*)    Total Protein 9.3 (*)    Albumin <1.5 (*)    AST 172 (*)    ALT 109 (*)    Total Bilirubin 3.7 (*)    GFR, Estimated 26 (*)    All other components within normal limits  URINALYSIS, ROUTINE W REFLEX MICROSCOPIC - Abnormal; Notable for the following components:   Color, Urine AMBER (*)    APPearance HAZY (*)    Specific Gravity, Urine 1.045 (*)    Bilirubin Urine SMALL (*)    All other components within normal limits  PROTIME-INR - Abnormal; Notable for  the following components:   Prothrombin Time 23.7 (*)    INR 2.1 (*)    All other components within normal limits  I-STAT CG4 LACTIC ACID, ED - Abnormal; Notable for the following components:   Lactic Acid, Venous 2.0 (*)    All other components within normal limits  LIPASE, BLOOD  AFP TUMOR MARKER  I-STAT CG4 LACTIC ACID, ED                                                                                                                          Radiology CT ABDOMEN PELVIS W CONTRAST Result Date: 10/10/2023 CLINICAL DATA:  Generalized abdominal pain EXAM: CT ABDOMEN AND PELVIS WITH CONTRAST TECHNIQUE: Multidetector CT imaging of the abdomen and pelvis was performed using the standard protocol following bolus administration of intravenous contrast. RADIATION DOSE REDUCTION: This exam was performed according to the departmental dose-optimization program which includes automated exposure control, adjustment of the mA and/or kV according to patient size and/or use of iterative reconstruction technique. CONTRAST:  60mL OMNIPAQUE IOHEXOL 350 MG/ML SOLN COMPARISON:  CT scan from 2019 and abdominal ultrasound 09/20/2023 FINDINGS: Lower chest: The lung bases are clear of acute process. No pleural effusion or pulmonary lesions. The heart is normal in size. No pericardial effusion. Stable aortic and coronary artery calcifications. The distal esophagus and aorta are unremarkable. Hepatobiliary: Progressive cirrhotic changes involving the liver. Associated portal venous hypertension, portal venous collaterals, paraesophageal varices, mesenteric edema and small volume ascites. There is a heterogeneously enhancing 4 x 4 cm right hepatic lobe mass in segment 6. Findings highly suspicious for hepatoma. MRI abdomen without and with contrast is recommended for more definitive evaluation. No intrahepatic biliary dilatation. The portal and hepatic veins are patent. Calcified gallstones are noted in the gallbladder. The  gallbladder wall is markedly thickened. This could reflect acute cholecystitis or changes related to cirrhosis. Right upper quadrant ultrasound may be helpful for further evaluation. No common bile duct dilatation. Pancreas: Unremarkable. No pancreatic ductal dilatation or surrounding inflammatory changes. Spleen: Normal size.  No focal lesions.  The splenic vein is patent. Adrenals/Urinary Tract: Heterogeneously enhancing right adrenal gland mass measures 3.8 x  2.2 cm. The left adrenal gland is normal. Numerous bilateral nonenhancing renal cysts not requiring any further imaging evaluation or follow-up. Poor excretion of contrast through the kidneys is noted on the delayed images. Recommend correlation with renal function. The bladder is grossly normal. Stomach/Bowel: The stomach, duodenum and small are grossly normal without contrast. No inflammatory changes, mass lesions or obstructive findings. Right colonic wall thickening may be due to adjacent ascites and the patient's cirrhosis. Colonic diverticulosis with focal area acute perforated diverticulitis involving the sigmoid colon. Multiple small locules of gas and significant surrounding inflammatory changes. No discrete drainable abscess. Vascular/Lymphatic: Age advanced atherosclerotic calcification involving the aorta, iliac arteries and branch vessels but no aneurysm. The major venous structures are patent. Scattered upper abdominal lymph nodes typical with cirrhosis. No retroperitoneal mass or adenopathy. No pelvic adenopathy. Reproductive: The uterus and ovaries are unremarkable. Other: Mesenteric edema, small volume abdominal/pelvic ascites and diffuse body wall edema. Musculoskeletal: No significant findings. Chronic changes of bilateral hip AVN. IMPRESSION: 1. Progressive cirrhotic changes involving the liver with associated portal venous hypertension, portal venous collaterals, paraesophageal varices, mesenteric edema and small volume ascites. 2.  Heterogeneously enhancing 4 x 4 cm right hepatic lobe mass in segment 6. Findings highly suspicious for hepatoma. MRI abdomen without and with contrast is recommended for more definitive evaluation. 3. Heterogeneously enhancing right adrenal gland mass measures 3.8 x 2.2 cm. Findings are concerning for metastatic disease. 4. Cholelithiasis with marked gallbladder wall thickening. This could reflect acute cholecystitis or changes related to cirrhosis. Right upper quadrant ultrasound may be helpful for further evaluation. 5. Colonic diverticulosis with focal area of acute perforated diverticulitis involving the sigmoid colon. Multiple small locules of gas and significant surrounding inflammatory changes. No discrete drainable abscess. 6. Poor excretion of contrast through the kidneys on the delayed images. Recommend correlation with renal function. 7. Age advanced atherosclerotic calcification involving the aorta, iliac arteries and branch vessels. 8. Aortic atherosclerosis. These results will be called to the ordering clinician or representative by the Radiologist Assistant, and communication documented in the PACS or [employer]. Electronically Signed   By: Rudie Meyer M.D.   On: 10/10/2023 09:24    Pertinent labs & imaging results that were available during my care of the patient were reviewed by me and considered in my medical decision making (see MDM for details).  Medications Ordered in ED Medications  piperacillin-tazobactam (ZOSYN) IVPB 3.375 g (3.375 g Intravenous New Bag/Given 10/10/23 1713)  lactated ringers bolus 1,000 mL (has no administration in time range)  morphine (PF) 2 MG/ML injection 2 mg (has no administration in time range)  lactated ringers bolus 1,000 mL (0 mLs Intravenous Stopped 10/10/23 1810)  HYDROmorphone (DILAUDID) injection 1 mg (1 mg Intravenous Given 10/10/23 1705)  ondansetron (ZOFRAN) injection 4 mg (4 mg Intravenous Given 10/10/23 1704)  Procedures .Critical Care  Performed by: Sloan Leiter, DO Authorized by: Sloan Leiter, DO   Critical care provider statement:    Critical care time (minutes):  49   Critical care time was exclusive of:  Separately billable procedures and treating other patients   Critical care was necessary to treat or prevent imminent or life-threatening deterioration of the following conditions:  Dehydration   Critical care was time spent personally by me on the following activities:  Development of treatment plan with patient or surrogate, discussions with consultants, evaluation of patient's response to treatment, examination of patient, ordering and review of laboratory studies, ordering and review of radiographic studies, ordering and performing treatments and interventions, pulse oximetry, re-evaluation of patient's condition, review of old charts and obtaining history from patient or surrogate   Care discussed with: admitting provider     (including critical care time)  Medical Decision Making / ED Course    Medical Decision Making:    Dana Thornton is a 68 y.o. female  with past medical history as below, significant for HLD, HTN, nephrolithiasis, T2DM,  who presents to the ED with complaint of sent by gastroenterologist. The complaint involves an extensive differential diagnosis and also carries with it a high risk of complications and morbidity.  Serious etiology was considered. Ddx includes but is not limited to: Differential diagnosis includes but is not exclusive to ectopic pregnancy, ovarian cyst, ovarian torsion, acute appendicitis, urinary tract infection, endometriosis, bowel obstruction, hernia, colitis, renal colic, gastroenteritis, volvulus etc.   Complete initial physical exam performed, notably the patient was in mild distress secondary to pain, abdomen nonperitoneal, HDS.     Reviewed and confirmed nursing documentation for past medical history, family history, social history.  Vital signs reviewed.    Clinical Course as of 10/10/23 1825  Thu Oct 10, 2023  1737 Creatinine(!): 2.03 AKI, previously was wnl around 1 year ago [SG]  1806 Spoke w/ Dr Carolynne Edouard, they will see tomorrow  [SG]  1806 Spoke w/ Dr Lorenso Quarry, they will see tomorrow [SG]    Clinical Course User Index [SG] Sloan Leiter, DO    Brief summary: 68 year old female history as above here from GI office secondary to abnormal CT imaging.  Abdominal pain for a week.  Worsened with past few days.  Found to have perforated diverticulitis on CT imaging.  Also cholelithiasis, she has no upper quadrant pain, doubt cholecystitis.  Images also concern for possible metastatic disease.  She also has AKI, start IV fluid.  Reports poor p.o. intake over the last 2 weeks secondary to abdominal pain.  Improved after parenteral analgesia.  Started Zosyn for perforated diverticulum.  Will consult general surgery.  Liver enzymes elevated, likely 2/2 presumed hepatoma  Plan to the patient to the medical service with gen surgeon consult, GI consult. Oncology consult.    Admit Dr Allena Katz [facility]            Additional history obtained: -Additional history obtained from spouse -External records from outside source obtained and reviewed including: Chart review including previous notes, labs, imaging, consultation notes including  Prior labs/imaging/home meds   Lab Tests: -I ordered, reviewed, and interpreted labs.   The pertinent results include:   Labs Reviewed  CBC WITH DIFFERENTIAL/PLATELET - Abnormal; Notable for the following components:      Result Value   WBC 16.4 (*)    RBC 3.65 (*)    Hemoglobin 11.4 (*)    HCT 31.9 (*)    RDW  16.1 (*)    Platelets 145 (*)    Neutro Abs 11.8 (*)    Monocytes Absolute 1.3 (*)    Abs Immature Granulocytes 0.22 (*)    All other components within normal limits   COMPREHENSIVE METABOLIC PANEL - Abnormal; Notable for the following components:   Sodium 131 (*)    CO2 21 (*)    BUN 25 (*)    Creatinine, Ser 2.03 (*)    Calcium 7.6 (*)    Total Protein 9.3 (*)    Albumin <1.5 (*)    AST 172 (*)    ALT 109 (*)    Total Bilirubin 3.7 (*)    GFR, Estimated 26 (*)    All other components within normal limits  URINALYSIS, ROUTINE W REFLEX MICROSCOPIC - Abnormal; Notable for the following components:   Color, Urine AMBER (*)    APPearance HAZY (*)    Specific Gravity, Urine 1.045 (*)    Bilirubin Urine SMALL (*)    All other components within normal limits  PROTIME-INR - Abnormal; Notable for the following components:   Prothrombin Time 23.7 (*)    INR 2.1 (*)    All other components within normal limits  I-STAT CG4 LACTIC ACID, ED - Abnormal; Notable for the following components:   Lactic Acid, Venous 2.0 (*)    All other components within normal limits  LIPASE, BLOOD  AFP TUMOR MARKER  I-STAT CG4 LACTIC ACID, ED    Notable for as above, LFT's + AKI   EKG   EKG Interpretation Date/Time:    Ventricular Rate:    PR Interval:    QRS Duration:    QT Interval:    QTC Calculation:   R Axis:      Text Interpretation:           Imaging Studies ordered: Reviewed CT from earlier today   Medicines ordered and prescription drug management: Meds ordered this encounter  Medications   lactated ringers bolus 1,000 mL   HYDROmorphone (DILAUDID) injection 1 mg   ondansetron (ZOFRAN) injection 4 mg   piperacillin-tazobactam (ZOSYN) IVPB 3.375 g    Antibiotic Indication::   Other Indication (list below)    Other Indication::   perforated diverticulitis   lactated ringers bolus 1,000 mL   morphine (PF) 2 MG/ML injection 2 mg    -I have reviewed the patients home medicines and have made adjustments as needed   Consultations Obtained: I requested consultation with the dr toth, dr Lorenso Quarry,  and discussed lab and imaging findings as well  as pertinent plan - they recommend: will see in consult    Cardiac Monitoring: The patient was maintained on a cardiac monitor.  I personally viewed and interpreted the cardiac monitored which showed an underlying rhythm of: nsr Continuous pulse oximetry interpreted by myself, 100% on RA.    Social Determinants of Health:  Diagnosis or treatment significantly limited by social determinants of health: obesity   Reevaluation: After the interventions noted above, I reevaluated the patient and found that they have improved  Co morbidities that complicate the patient evaluation  Past Medical History:  Diagnosis Date   Hypercholesterolemia    Hypertension    Kidney stones    Type II diabetes mellitus (HCC)       Dispostion: Disposition decision including need for hospitalization was considered, and patient admitted to the hospital.    Final Clinical Impression(s) / ED Diagnoses Final diagnoses:  Diverticulitis  Perforated diverticulum  AKI (acute kidney  injury) (HCC)  Elevated liver enzymes  Lesion of liver  Lesion of adrenal gland (HCC)        Sloan Leiter, DO 10/10/23 1610

## 2023-10-11 DIAGNOSIS — K5720 Diverticulitis of large intestine with perforation and abscess without bleeding: Secondary | ICD-10-CM | POA: Diagnosis not present

## 2023-10-11 LAB — COMPREHENSIVE METABOLIC PANEL
ALT: 93 U/L — ABNORMAL HIGH (ref 0–44)
AST: 144 U/L — ABNORMAL HIGH (ref 15–41)
Albumin: <1.5 g/dL — ABNORMAL LOW (ref 3.5–5.0)
Alkaline Phosphatase: 113 U/L (ref 38–126)
Anion gap: 3 — ABNORMAL LOW (ref 5–15)
BUN: 25 mg/dL — ABNORMAL HIGH (ref 8–23)
CO2: 21 mmol/L — ABNORMAL LOW (ref 22–32)
Calcium: 7.3 mg/dL — ABNORMAL LOW (ref 8.9–10.3)
Chloride: 105 mmol/L (ref 98–111)
Creatinine, Ser: 2.21 mg/dL — ABNORMAL HIGH (ref 0.44–1.00)
GFR, Estimated: 24 mL/min — ABNORMAL LOW (ref >60)
Glucose, Bld: 80 mg/dL (ref 70–99)
Potassium: 4.6 mmol/L (ref 3.5–5.1)
Sodium: 129 mmol/L — ABNORMAL LOW (ref 135–145)
Total Bilirubin: 3.4 mg/dL — ABNORMAL HIGH (ref 0.0–1.2)
Total Protein: 8.2 g/dL — ABNORMAL HIGH (ref 6.5–8.1)

## 2023-10-11 LAB — CBC
HCT: 28 % — ABNORMAL LOW (ref 36.0–46.0)
Hemoglobin: 9.8 g/dL — ABNORMAL LOW (ref 12.0–15.0)
MCH: 31.1 pg (ref 26.0–34.0)
MCHC: 35 g/dL (ref 30.0–36.0)
MCV: 88.9 fL (ref 80.0–100.0)
Platelets: 123 10*3/uL — ABNORMAL LOW (ref 150–400)
RBC: 3.15 MIL/uL — ABNORMAL LOW (ref 3.87–5.11)
RDW: 16.4 % — ABNORMAL HIGH (ref 11.5–15.5)
WBC: 14 10*3/uL — ABNORMAL HIGH (ref 4.0–10.5)
nRBC: 0 % (ref 0.0–0.2)

## 2023-10-11 LAB — GLUCOSE, CAPILLARY
Glucose-Capillary: 118 mg/dL — ABNORMAL HIGH (ref 70–99)
Glucose-Capillary: 68 mg/dL — ABNORMAL LOW (ref 70–99)
Glucose-Capillary: 69 mg/dL — ABNORMAL LOW (ref 70–99)
Glucose-Capillary: 76 mg/dL (ref 70–99)
Glucose-Capillary: 82 mg/dL (ref 70–99)
Glucose-Capillary: 96 mg/dL (ref 70–99)
Glucose-Capillary: 99 mg/dL (ref 70–99)

## 2023-10-11 LAB — HIV ANTIBODY (ROUTINE TESTING W REFLEX): HIV Screen 4th Generation wRfx: NONREACTIVE

## 2023-10-11 LAB — PROTIME-INR
INR: 2.2 — ABNORMAL HIGH (ref 0.8–1.2)
Prothrombin Time: 24.9 s — ABNORMAL HIGH (ref 11.4–15.2)

## 2023-10-11 LAB — AMMONIA: Ammonia: 30 umol/L (ref 9–35)

## 2023-10-11 MED ORDER — IPRATROPIUM-ALBUTEROL 0.5-2.5 (3) MG/3ML IN SOLN: 3.0000 mL | RESPIRATORY_TRACT | Status: DC | PRN

## 2023-10-11 MED ORDER — SENNOSIDES-DOCUSATE SODIUM 8.6-50 MG PO TABS: 1.0000 | ORAL_TABLET | Freq: Every evening | ORAL | Status: DC | PRN

## 2023-10-11 MED ORDER — DEXTROSE 50 % IV SOLN
25.0000 mL | Freq: Once | INTRAVENOUS | Status: DC
  Administered 2023-10-12: 25 mL via INTRAVENOUS
  Filled 2023-10-11: qty 50

## 2023-10-11 MED ORDER — TRAZODONE HCL 50 MG PO TABS: 50.0000 mg | ORAL_TABLET | Freq: Every evening | ORAL | Status: DC | PRN

## 2023-10-11 MED ORDER — SODIUM CHLORIDE 0.9 % IV BOLUS
500.0000 mL | Freq: Once | INTRAVENOUS | Status: AC
  Administered 2023-10-11: 500 mL via INTRAVENOUS

## 2023-10-11 MED ORDER — GUAIFENESIN 100 MG/5ML PO LIQD: 5.0000 mL | ORAL | Status: DC | PRN

## 2023-10-11 MED ORDER — GLUCAGON HCL RDNA (DIAGNOSTIC) 1 MG IJ SOLR: 1.0000 mg | INTRAMUSCULAR | Status: DC | PRN

## 2023-10-11 MED ORDER — HYDRALAZINE HCL 20 MG/ML IJ SOLN: 10.0000 mg | INTRAMUSCULAR | Status: DC | PRN

## 2023-10-11 MED ORDER — DEXTROSE 50 % IV SOLN
12.5000 g | INTRAVENOUS | Status: AC
Start: 2023-10-11 — End: 2023-10-11
  Administered 2023-10-11: 12.5 g via INTRAVENOUS
  Filled 2023-10-11: qty 50

## 2023-10-11 MED ORDER — METOPROLOL TARTRATE 5 MG/5ML IV SOLN: 5.0000 mg | INTRAVENOUS | Status: DC | PRN

## 2023-10-11 MED ORDER — ACETAMINOPHEN 325 MG PO TABS: 650.0000 mg | ORAL_TABLET | Freq: Four times a day (QID) | ORAL | Status: DC | PRN

## 2023-10-11 MED ORDER — DEXTROSE-SODIUM CHLORIDE 5-0.45 % IV SOLN: INTRAVENOUS | Status: DC

## 2023-10-11 MED ORDER — ALBUMIN HUMAN 25 % IV SOLN
25.0000 g | Freq: Four times a day (QID) | INTRAVENOUS | Status: DC
  Administered 2023-10-11 – 2023-10-12 (×4): 25 g via INTRAVENOUS
  Filled 2023-10-11 (×5): qty 100

## 2023-10-11 NOTE — Progress Notes (Signed)
Hypoglycemic Event  CBG: 69  Treatment: D50 25 mL (12.5 gm)  Symptoms: None  Follow-up CBG: Time:20 CBG Result:118  Possible Reasons for Event: Inadequate meal intake  Comments/MD notified: Miguel Rota, MD notified face to face    Bella Kennedy E Juniel Groene

## 2023-10-11 NOTE — Consult Note (Addendum)
[facility] CONSULT NOTE  Patient Care Team: Ollen Bowl, MD as PCP - General (Internal Medicine)  CHIEF COMPLAINTS/PURPOSE OF CONSULTATION:  Liver mass with adrenal mets  REFERRING PHYSICIAN: Dr. Nelson Chimes  HISTORY OF PRESENTING ILLNESS:  Dana Thornton 68 y.o. female who came to the ED due to abdominal pain and abnormal findings on the CT scan that was ordered by her primary physician.  CT of abdomen pelvis done 10/10/2023 showed right hepatic lobe mass highly suspicious for hepatoma with right adrenal gland mass concerning for metastatic disease.  Therefore oncology consult was requested. Patient is seen today awake and somewhat alert although somnolent during examination.  Patient's husband is at bedside and was principal historian.  He states about 1 week ago patient began to complain of lower abdominal pain which radiated to her back.  She went to see her PCP who ordered a CT scan and this is why they are here. Medical history includes hypertension, diabetes, history of gallstones and cirrhosis for which she sees her primary GI Dr. Bosie Clos. Surgical history is noncontributory. Family history is noncontributory. Social history-denies tobacco use, denies alcohol use, denies illicit and recreational drug use.  Patient is retired and worked at a call center.  She is married with 2 adult children.    I have reviewed her chart and materials related to her cancer extensively and collaborated history with the patient. Summary of oncologic history is as follows: Oncology History   No history exists.    ASSESSMENT & PLAN:   1.  Liver mass with adrenal mets History of cirrhosis -CT of abdomen pelvis done 10/10/2023 shows progressive cirrhotic changes.  4 x 4 cm right hepatic lobe mass highly suspicious for hepatoma.  Right adrenal gland mass 3.8 x 2.2 concerning for metastatic disease. - May be HCC.  - Appreciate GI evaluation. - Surgery has been consulted. - Medical  oncology/Dr. Al Pimple following closely  2.  Abdominal pain Cholelithiasis - Patient complaining of 1 week history of abdominal pain prior to admission -Cholelithiasis with marked gallbladder wall thickening seen on CT scan. - Continue judicious pain management - GI evaluation and management  3.  Anemia/thrombocytopenia - Hemoglobin 9.8 today - Transfuse PRBC for Hgb <7.0.  No transfusional intervention required at this time. - Platelets slightly low 123K.  No transfusional intervention required at this time. - Continue to monitor CBC with differential  4.  Hypertension Diabetes - Monitor blood pressure levels and give antihypertensives as prescribed -Monitor blood sugar levels - Medicine following and managing  Orders Placed This Encounter  Procedures   Critical Care    This order was created via procedure documentation    Standing Status:   Standing    Number of Occurrences:   1   CBC with Differential    Standing Status:   Standing    Number of Occurrences:   1   Comprehensive metabolic panel    Standing Status:   Standing    Number of Occurrences:   1   Lipase, blood    Standing Status:   Standing    Number of Occurrences:   1   Urinalysis, Routine w reflex microscopic -Urine, Clean Catch    Standing Status:   Standing    Number of Occurrences:   1    Specimen Source:   Urine, Clean Catch [[id number]]   Protime-INR    Standing Status:   Standing    Number of Occurrences:   1   AFP tumor  marker    Standing Status:   Standing    Number of Occurrences:   1   HIV Antibody (routine testing w rflx)    Standing Status:   Standing    Number of Occurrences:   1   Comprehensive metabolic panel    Standing Status:   Standing    Number of Occurrences:   1   CBC    Standing Status:   Standing    Number of Occurrences:   1   Protime-INR    Standing Status:   Standing    Number of Occurrences:   1   Glucose, capillary    Standing Status:   Standing    Number of Occurrences:   1    Glucose, capillary    Standing Status:   Standing    Number of Occurrences:   1   Glucose, capillary    Standing Status:   Standing    Number of Occurrences:   1   CBC    Standing Status:   Standing    Number of Occurrences:   7   Comprehensive metabolic panel    Standing Status:   Standing    Number of Occurrences:   5   Phosphorus    Standing Status:   Standing    Number of Occurrences:   1   Magnesium    Standing Status:   Standing    Number of Occurrences:   7   Glucose, capillary    Standing Status:   Standing    Number of Occurrences:   1   Diet NPO time specified Except for: Ice Chips, Sips with Meds    Standing Status:   Standing    Number of Occurrences:   1    Except for:   Ice Chips    Except for:   Sips with Meds   Vital signs    Standing Status:   Standing    Number of Occurrences:   1   Notify physician (specify)    Standing Status:   Standing    Number of Occurrences:   20    Notify Physician:   for pulse less than 55 or greater than 120    Notify Physician:   for respiratory rate less than 12 or greater than 25    Notify Physician:   for temperature greater than 100.5 F    Notify Physician:   for urinary output less than 30 mL/hr for four hours    Notify Physician:   for systolic BP less than 90 or greater than 160, diastolic BP less than 60 or greater than 100    Notify Physician:   for new hypoxia w/ oxygen saturations < 88%   Mobility Protocol: No Restrictions RN to initiate protocols based on patient's level of care    RN to initiate protocols based on patient's level of care    Standing Status:   Standing    Number of Occurrences:   1   Refer to Sidebar Report Refer to ICU, Med-Surg, Progressive, and Step-Down Mobility Protocol Sidebars    Refer to ICU, Med-Surg, Progressive, and Step-Down Mobility Protocol Sidebars    Standing Status:   Standing    Number of Occurrences:   1   Initiate Oral Care Protocol    Standing Status:   Standing    Number  of Occurrences:   1   Initiate Carrier Fluid Protocol    Standing Status:   Standing  Number of Occurrences:   1   RN may order General Admission PRN Orders utilizing "General Admission PRN medications" (through manage orders) for the following patient needs: allergy symptoms (Claritin), cold sores (Carmex), cough (Robitussin DM), eye irritation (Liquifilm Tears), hemorrhoids (Tucks), indigestion (Maalox), minor skin irritation (Hydrocortisone Cream), muscle pain Romeo Apple Gay), nose irritation (saline nasal spray) and sore throat (Chloraseptic spray).    Standing Status:   Standing    Number of Occurrences:   [phone]   Patient has an active order for admit to inpatient/place in observation    Standing Status:   Standing    Number of Occurrences:   1   Intake and Output    Standing Status:   Standing    Number of Occurrences:   1   Daily weights    Standing Status:   Standing    Number of Occurrences:   1   Apply Diabetes Mellitus Care Plan    Standing Status:   Standing    Number of Occurrences:   1   STAT CBG when hypoglycemia is suspected. If treated, recheck every 15 minutes after each treatment until CBG >/= 70 mg/dl    Standing Status:   Standing    Number of Occurrences:   1   Refer to Hypoglycemia Protocol Sidebar Report for treatment of CBG < 70 mg/dl    Standing Status:   Standing    Number of Occurrences:   1   Out of Bed    Standing Status:   Standing    Number of Occurrences:   1   Full code    Standing Status:   Standing    Number of Occurrences:   1    By::   Consent: discussion documented in EHR   Consult to general surgery    Standing Status:   Standing    Number of Occurrences:   1    Place call to::   oncall General Surgery    Reason for Consult:   Consult   Consult to oncology    Standing Status:   Standing    Number of Occurrences:   1    Place call to::   oncall Oncology    Reason for Consult:   Consult   Consult to hospitalist    Standing Status:    Standing    Number of Occurrences:   1    Place call to::   [facility]    Reason for Consult:   Admit   piperacillin-tazobactam (ZOSYN) per pharmacy consult    Standing Status:   Standing    Number of Occurrences:   1    Antibiotic Indication::   Intra-abdominal Infection   monitoring by pharmacy    Standing Status:   Standing    Number of Occurrences:   1    Reason for Pharmacy Monitoring (*indicate specifics in comment field):   High-Risk PTA Med (RPh to use comment field to indicate meds)    Comment::   mercaptopurine   Oxygen therapy Mode or (Route): Nasal cannula; Liters Per Minute: 2; Keep O2 saturation between: greater than 92 %    Standing Status:   Standing    Number of Occurrences:   20    Mode or (Route):   Nasal cannula    Liters Per Minute:   2    Keep O2 saturation between:   greater than 92 %   Pulse oximetry check with vital signs    Standing Status:  Standing    Number of Occurrences:   1   Incentive spirometry RT    Standing Status:   Standing    Number of Occurrences:   1   I-Stat CG4 Lactic Acid    Standing Status:   Standing    Number of Occurrences:   2   CBG monitoring, ED    Standing Status:   Standing    Number of Occurrences:   1   I-Stat CG4 Lactic Acid    Standing Status:   Standing    Number of Occurrences:   1   Insert peripheral IV    Standing Status:   Standing    Number of Occurrences:   1   Admit to Inpatient (patient's expected length of stay will be greater than 2 midnights or inpatient only procedure)    Standing Status:   Standing    Number of Occurrences:   1    Hospital Area:   [facility] [[id number]]    Level of Care:   Med-Surg [16]    May admit patient to [facility] or [facility] if equivalent level of care is available::   No    Covid Evaluation:   Asymptomatic - no recent exposure (last 10 days) testing not required    Diagnosis:   Perforation of sigmoid colon due to diverticulitis [[id number]]     Admitting Physician:   Charlsie Quest [[id number]]    Attending Physician:   Charlsie Quest [[id number]]    Certification::   I certify this patient will need inpatient services for at least 2 midnights    Expected Medical Readiness:   10/15/2023     MEDICAL HISTORY:  Past Medical History:  Diagnosis Date   Hypercholesterolemia    Hypertension    Kidney stones    Type II diabetes mellitus (HCC)     SURGICAL HISTORY: Past Surgical History:  Procedure Laterality Date   BREAST EXCISIONAL BIOPSY Left    CESAREAN SECTION  1999   DILATION AND CURETTAGE OF UTERUS     "I lost a baby"   IR RADIOLOGIST EVAL & MGMT  11/21/2017   IR RADIOLOGIST EVAL & MGMT  12/03/2017   TUBAL LIGATION  1999    SOCIAL HISTORY: Social History   Socioeconomic History   Marital status: Married    Spouse name: Not on file   Number of children: Not on file   Years of education: Not on file   Highest education level: Not on file  Occupational History   Not on file  Tobacco Use   Smoking status: Never   Smokeless tobacco: Never  Substance and Sexual Activity   Alcohol use: No   Drug use: No   Sexual activity: Yes    Partners: Male  Other Topics Concern   Not on file  Social History Narrative   Not on file   Social Drivers of Health   Financial Resource Strain: Not on file  Food Insecurity: No Food Insecurity (10/10/2023)   Hunger Vital Sign    Worried About Running Out of Food in the Last Year: Never true    Ran Out of Food in the Last Year: Never true  Transportation Needs: No Transportation Needs (10/10/2023)   PRAPARE - Administrator, Civil Service (Medical): No    Lack of Transportation (Non-Medical): No  Physical Activity: Not on file  Stress: Not on file  Social Connections: Socially Integrated (10/10/2023)   Social Connection and  Isolation Panel [NHANES]    Frequency of Communication with Friends and Family: More than three times a week    Frequency of Social Gatherings with  Friends and Family: Three times a week    Attends Religious Services: More than 4 times per year    Active Member of Clubs or Organizations: Yes    Attends Banker Meetings: More than 4 times per year    Marital Status: Married  Catering manager Violence: Not At Risk (10/10/2023)   Humiliation, Afraid, Rape, and Kick questionnaire    Fear of Current or Ex-Partner: No    Emotionally Abused: No    Physically Abused: No    Sexually Abused: No    FAMILY HISTORY: Family History  Problem Relation Age of Onset   Kidney failure Father    Hypertension Father     REVIEW OF SYSTEMS:   Constitutional: +Lethargic +somnolent, denies fevers, chills or abnormal night sweats Eyes: Denies blurriness of vision, double vision or watery eyes Ears, nose, mouth, throat, and face: Denies mucositis or sore throat Respiratory: Denies cough, dyspnea or wheezes Cardiovascular: Denies palpitation, chest discomfort or lower extremity swelling Gastrointestinal: + Nausea, + abdominal pain  Skin: Denies abnormal skin rashes Lymphatics: Denies new lymphadenopathy or easy bruising Neurological: Denies numbness, tingling or new weaknesses Behavioral/Psych: Mood is stable, no new changes  All other systems were reviewed with the patient and are negative.  PHYSICAL EXAMINATION: ECOG PERFORMANCE STATUS: 2 - Symptomatic, <50% confined to bed  Vitals:   10/11/23 0134 10/11/23 0500  BP: 115/68 109/62  Pulse: 80 81  Resp: 18   Temp: 98.2 F (36.8 C) (!) 97.5 F (36.4 C)  SpO2: 100% 100%   Filed Weights   10/10/23 1540 10/10/23 1617 10/10/23 2102  Weight: 203 lb (92.1 kg) 202 lb 13.2 oz (92 kg) 207 lb 12.8 oz (94.3 kg)    GENERAL: + Lethargic + somnolent SKIN: skin color, texture, turgor are normal, no rashes or significant lesions EYES: normal, conjunctiva are pink and non-injected, sclera clear OROPHARYNX: no exudate, no erythema and lips, buccal mucosa, and tongue normal  NECK: supple,  thyroid normal size, non-tender, without nodularity LYMPH: no palpable lymphadenopathy in the cervical, axillary or inguinal LUNGS: clear to auscultation and percussion with normal breathing effort HEART: regular rate & rhythm and no murmurs and no lower extremity edema ABDOMEN: abdomen soft, non-tender and normal bowel sounds MUSCULOSKELETAL: no cyanosis of digits and no clubbing  PSYCH: +Somnolent  NEURO: no focal motor/sensory deficits   ALLERGIES:  is allergic to ace inhibitors, codeine, and statins.  MEDICATIONS:  Current Facility-Administered Medications  Medication Dose Route Frequency Provider Last Rate Last Admin   acetaminophen (TYLENOL) suppository 650 mg  650 mg Rectal Q6H PRN Charlsie Quest, MD       acetaminophen (TYLENOL) tablet 650 mg  650 mg Oral Q6H PRN Amin, Ankit C, MD       albumin human 25 % solution 25 g  25 g Intravenous Q6H Amin, Ankit C, MD       dextrose 5 % and 0.45 % NaCl infusion   Intravenous Continuous Amin, Ankit C, MD 75 mL/hr at 10/11/23 0845 New Bag at 10/11/23 0845   dextrose 50 % solution 12.5 g  12.5 g Intravenous STAT Patel, Vishal R, MD       dextrose 50 % solution 25 mL  25 mL Intravenous Once Amin, Ankit C, MD       glucagon (human recombinant) (GLUCAGEN) injection 1  mg  1 mg Intravenous PRN Amin, Ankit C, MD       guaiFENesin (ROBITUSSIN) 100 MG/5ML liquid 5 mL  5 mL Oral Q4H PRN Amin, Ankit C, MD       hydrALAZINE (APRESOLINE) injection 10 mg  10 mg Intravenous Q4H PRN Amin, Ankit C, MD       HYDROmorphone (DILAUDID) injection 0.5 mg  0.5 mg Intravenous Q3H PRN Darreld Mclean R, MD   0.5 mg at 10/11/23 0648   insulin aspart (novoLOG) injection 0-9 Units  0-9 Units Subcutaneous Q4H Patel, Vishal R, MD       ipratropium-albuterol (DUONEB) 0.5-2.5 (3) MG/3ML nebulizer solution 3 mL  3 mL Nebulization Q4H PRN Miguel Rota, MD       [START ON 10/12/2023] levothyroxine (SYNTHROID) tablet 150 mcg  150 mcg Oral Once per day on Sunday Saturday Charlsie Quest, MD       levothyroxine (SYNTHROID) tablet 300 mcg  300 mcg Oral Once per day on Monday Tuesday Wednesday Thursday Friday Charlsie Quest, MD       metoprolol tartrate (LOPRESSOR) injection 5 mg  5 mg Intravenous Q4H PRN Amin, Ankit C, MD       ondansetron (ZOFRAN) injection 4 mg  4 mg Intravenous Q6H PRN Charlsie Quest, MD   4 mg at 10/11/23 0518   piperacillin-tazobactam (ZOSYN) IVPB 3.375 g  3.375 g Intravenous Q8H Cherylin Mylar, RPH 12.5 mL/hr at 10/11/23 0842 3.375 g at 10/11/23 0842   senna-docusate (Senokot-S) tablet 1 tablet  1 tablet Oral QHS PRN Amin, Ankit C, MD       traZODone (DESYREL) tablet 50 mg  50 mg Oral QHS PRN Amin, Ankit C, MD         LABORATORY DATA:  I have reviewed the data as listed Lab Results  Component Value Date   WBC 14.0 (H) 10/11/2023   HGB 9.8 (L) 10/11/2023   HCT 28.0 (L) 10/11/2023   MCV 88.9 10/11/2023   PLT 123 (L) 10/11/2023   Recent Labs    10/10/23 0830 10/10/23 1642 10/11/23 0315  NA  --  131* 129*  K  --  4.6 4.6  CL  --  105 105  CO2  --  21* 21*  GLUCOSE  --  89 80  BUN  --  25* 25*  CREATININE 2.10* 2.03* 2.21*  CALCIUM  --  7.6* 7.3*  GFRNONAA  --  26* 24*  PROT  --  9.3* 8.2*  ALBUMIN  --  <1.5* <1.5*  AST  --  172* 144*  ALT  --  109* 93*  ALKPHOS  --  112 113  BILITOT  --  3.7* 3.4*    RADIOGRAPHIC STUDIES: I have personally reviewed the radiological images as listed and agreed with the findings in the report. CT ABDOMEN PELVIS W CONTRAST Result Date: 10/10/2023 CLINICAL DATA:  Generalized abdominal pain EXAM: CT ABDOMEN AND PELVIS WITH CONTRAST TECHNIQUE: Multidetector CT imaging of the abdomen and pelvis was performed using the standard protocol following bolus administration of intravenous contrast. RADIATION DOSE REDUCTION: This exam was performed according to the departmental dose-optimization program which includes automated exposure control, adjustment of the mA and/or kV according to patient size and/or  use of iterative reconstruction technique. CONTRAST:  60mL OMNIPAQUE IOHEXOL 350 MG/ML SOLN COMPARISON:  CT scan from 2019 and abdominal ultrasound 09/20/2023 FINDINGS: Lower chest: The lung bases are clear of acute process. No pleural effusion or pulmonary lesions. The heart is  normal in size. No pericardial effusion. Stable aortic and coronary artery calcifications. The distal esophagus and aorta are unremarkable. Hepatobiliary: Progressive cirrhotic changes involving the liver. Associated portal venous hypertension, portal venous collaterals, paraesophageal varices, mesenteric edema and small volume ascites. There is a heterogeneously enhancing 4 x 4 cm right hepatic lobe mass in segment 6. Findings highly suspicious for hepatoma. MRI abdomen without and with contrast is recommended for more definitive evaluation. No intrahepatic biliary dilatation. The portal and hepatic veins are patent. Calcified gallstones are noted in the gallbladder. The gallbladder wall is markedly thickened. This could reflect acute cholecystitis or changes related to cirrhosis. Right upper quadrant ultrasound may be helpful for further evaluation. No common bile duct dilatation. Pancreas: Unremarkable. No pancreatic ductal dilatation or surrounding inflammatory changes. Spleen: Normal size.  No focal lesions.  The splenic vein is patent. Adrenals/Urinary Tract: Heterogeneously enhancing right adrenal gland mass measures 3.8 x 2.2 cm. The left adrenal gland is normal. Numerous bilateral nonenhancing renal cysts not requiring any further imaging evaluation or follow-up. Poor excretion of contrast through the kidneys is noted on the delayed images. Recommend correlation with renal function. The bladder is grossly normal. Stomach/Bowel: The stomach, duodenum and small are grossly normal without contrast. No inflammatory changes, mass lesions or obstructive findings. Right colonic wall thickening may be due to adjacent ascites and the  patient's cirrhosis. Colonic diverticulosis with focal area acute perforated diverticulitis involving the sigmoid colon. Multiple small locules of gas and significant surrounding inflammatory changes. No discrete drainable abscess. Vascular/Lymphatic: Age advanced atherosclerotic calcification involving the aorta, iliac arteries and branch vessels but no aneurysm. The major venous structures are patent. Scattered upper abdominal lymph nodes typical with cirrhosis. No retroperitoneal mass or adenopathy. No pelvic adenopathy. Reproductive: The uterus and ovaries are unremarkable. Other: Mesenteric edema, small volume abdominal/pelvic ascites and diffuse body wall edema. Musculoskeletal: No significant findings. Chronic changes of bilateral hip AVN. IMPRESSION: 1. Progressive cirrhotic changes involving the liver with associated portal venous hypertension, portal venous collaterals, paraesophageal varices, mesenteric edema and small volume ascites. 2. Heterogeneously enhancing 4 x 4 cm right hepatic lobe mass in segment 6. Findings highly suspicious for hepatoma. MRI abdomen without and with contrast is recommended for more definitive evaluation. 3. Heterogeneously enhancing right adrenal gland mass measures 3.8 x 2.2 cm. Findings are concerning for metastatic disease. 4. Cholelithiasis with marked gallbladder wall thickening. This could reflect acute cholecystitis or changes related to cirrhosis. Right upper quadrant ultrasound may be helpful for further evaluation. 5. Colonic diverticulosis with focal area of acute perforated diverticulitis involving the sigmoid colon. Multiple small locules of gas and significant surrounding inflammatory changes. No discrete drainable abscess. 6. Poor excretion of contrast through the kidneys on the delayed images. Recommend correlation with renal function. 7. Age advanced atherosclerotic calcification involving the aorta, iliac arteries and branch vessels. 8. Aortic  atherosclerosis. These results will be called to the ordering clinician or representative by the Radiologist Assistant, and communication documented in the PACS or [employer]. Electronically Signed   By: Rudie Meyer M.D.   On: 10/10/2023 09:24   US Abdomen Limited Result Date: 09/20/2023 CLINICAL DATA:  Evaluate for ascites EXAM: LIMITED ABDOMEN ULTRASOUND FOR ASCITES TECHNIQUE: Limited ultrasound survey for ascites was performed in all four abdominal quadrants. COMPARISON:  CT abdomen pelvis 12/03/2017 FINDINGS: Small volume ascites within all 4 quadrants. Moderate within the right lower and left lower quadrants. IMPRESSION: Small volume ascites. Electronically Signed   By: Annia Belt M.D.   On:  09/20/2023 13:03     The total time spent in the appointment was 55 minutes encounter with patients including review of chart and various tests results, discussions about plan of care and coordination of care plan   All questions were answered. The patient knows to call the clinic with any problems, questions or concerns. No barriers to learning was detected.  Dawson Bills, NP 1/31/20259:45 AM   Attending Note  I personally saw the patient, reviewed the chart and examined the patient. The plan of care was discussed with the patient and the admitting team. I agree with the assessment and plan as documented above. Thank you very much for the consultation. I have reviewed history, records as well as discussed the plan with the patient's family and patient.  Patient has underlying history of cirrhosis however never has been decompensated and did not require paracentesis, variceal ligation or had history of encephalopathy.  She follows up with Dr. Bosie Clos in gastroenterology.  I would recommend MRI abdomen for further characterization of the hepatoma.  It is unusual for solitary hepatoma to metastasize to the adrenal gland.  Hence I hope MRI abdomen with additional views of the adrenal gland will  help characterize this better.  After MRI, I would recommend presenting her in the tumor board.  If the adrenal metastasis is excluded since it is a solitary tumor, we can refer her to transplant surgery to determine transplant eligibility.  If she is not a transplant candidate, we can consider local therapies.  I have clearly discussed with the family that this happens to be an incidental finding and is not immediately life-threatening.  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  At this time she does not need any inpatient intervention except the MRI abdomen and CT chest for completion of staging.  Thank you for consulting [place] in the care of this patient.  Please do not hesitate to contact us with any additional questions or concerns

## 2023-10-11 NOTE — Progress Notes (Signed)
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  Patient has been abdominal pain outpatient for several days therefore was referred to outpatient GI who ended up getting CT scan which showed abnormal findings and eventually referred to the hospital.   Assessment & Plan:  Principal Problem:   Perforation of sigmoid colon due to diverticulitis Active Problems:   AKI (acute kidney injury) (HCC)   Hepatic cirrhosis (HCC)   Liver mass, right lobe   Autoimmune hepatitis (HCC)   Insulin dependent type 2 diabetes mellitus (HCC)   Hypertension associated with diabetes (HCC)   Mass of right adrenal gland (HCC)   Cholelithiasis   Hyponatremia   Normocytic anemia   Hypothyroidism   Hyperlipidemia associated with type 2 diabetes mellitus (HCC)    Acute sigmoid diverticulitis with perforation: -This is seen on the CT scan.  Currently on IV Zosyn, NPO.  General surgery is consulted.  Antibiotics, supportive care    Acute kidney injury: Creatinine 2.03 on admission, baseline creatinine 1.3. Holding nephrotoxic drugs Ordering IVF and Albumin x 6 doses.    Right hepatic lobe mass Right adrenal gland mass: A heterogeneously enhancing 4 x 4 cm right hepatic lobe mass was seen on CT, highly suspicious for hepatoma.  MRI abdomen w/wo recommended for further evaluation.  Also noted to have a 3.8 x 2.2 cm heterogeneously enhancing right adrenal gland mass concerning for metastatic disease.  Check AFP.  MRCP once patient is more stable. Etiology  of pulmonary   Hepatic cirrhosis with portal venous hypertension, paraesophageal varices, ascites History of autoimmune hepatitis: Signs of decompensation.  On outpatient mercaptopurine.  Management per GI    Cholelithiasis with gallbladder wall thickening: Possibly secondary to cirrhosis.  Will see what general surgery has to say about this   Hyponatremia: From possible dehydration and cirrhosis.  Gentle hydration  Lactic acidosis - Improved with IV fluids   Insulin-dependent type 2 diabetes: -Sliding scale and Accu-Cheks   Hypertension: Currently on hold.  IV as needed   Normocytic anemia: Stay well hemoglobin around 11   Hypothyroidism: On Synthroid   Hyperlipidemia: Holding Zetia.  DVT prophylaxis: SCDs    Code Status: Full Code Family Communication: Husband at bedside.  Spoke to the daughter over the phone Status is: Inpatient Remains inpatient appropriate because: On going eval for multiple GI issues    Subjective: Patient is tired this morning, poor oral intake.  No other complaints. Had an extensive discussion with patient's daughter and husband.   Examination:  General exam: Appears calm and comfortable  Respiratory system: Clear to auscultation. Respiratory effort normal. Cardiovascular system: S1 & S2 heard, RRR. No JVD, murmurs, rubs, gallops or clicks. No pedal edema. Gastrointestinal system: Abdomen is nondistended, soft and nontender. No organomegaly or masses felt. Normal bowel sounds heard. Central nervous system: Alert and oriented. No focal neurological deficits. Extremities: Symmetric 5 x 5 power. Skin: No rashes, lesions or ulcers Psychiatry: Judgement and insight appear normal. Mood & affect appropriate.                Diet Orders (From admission, onward)     Start  Ordered   10/10/23 1939  Diet NPO time specified Except for: Ice Chips, Sips with Meds  Diet effective now       Question Answer Comment  Except for Ice  Chips   Except for Sips with Meds      10/10/23 1938            Objective: Vitals:   10/10/23 2039 10/10/23 2102 10/11/23 0134 10/11/23 0500  BP: 105/64  115/68 109/62  Pulse: 79  80 81  Resp:   18   Temp: 97.7 F (36.5 C)  98.2 F (36.8 C) (!) 97.5 F (36.4 C)  TempSrc: Oral     SpO2: 96%  100% 100%  Weight:  94.3 kg    Height:  5\' 2"  (1.575 m)      Intake/Output Summary (Last 24 hours) at 10/11/2023 1050 Last data filed at 10/11/2023 0738 Gross per 24 hour  Intake 2391.83 ml  Output --  Net 2391.83 ml   Filed Weights   10/10/23 1540 10/10/23 1617 10/10/23 2102  Weight: 92.1 kg 92 kg 94.3 kg    Scheduled Meds:  dextrose  12.5 g Intravenous STAT   dextrose  25 mL Intravenous Once   insulin aspart  0-9 Units Subcutaneous Q4H   [START ON 10/12/2023] levothyroxine  150 mcg Oral Once per day on Sunday Saturday   levothyroxine  300 mcg Oral Once per day on Monday Tuesday Wednesday Thursday Friday   Continuous Infusions:  albumin human     dextrose 5 % and 0.45 % NaCl 75 mL/hr at 10/11/23 0845   piperacillin-tazobactam (ZOSYN)  IV 3.375 g (10/11/23 0842)    Nutritional status     Body mass index is 38.01 kg/m.  Data Reviewed:   CBC: Recent Labs  Lab 10/10/23 1642 10/11/23 0315  WBC 16.4* 14.0*  NEUTROABS 11.8*  --   HGB 11.4* 9.8*  HCT 31.9* 28.0*  MCV 87.4 88.9  PLT 145* 123*   Basic Metabolic Panel: Recent Labs  Lab 10/10/23 0830 10/10/23 1642 10/11/23 0315  NA  --  131* 129*  K  --  4.6 4.6  CL  --  105 105  CO2  --  21* 21*  GLUCOSE  --  89 80  BUN  --  25* 25*  CREATININE 2.10* 2.03* 2.21*  CALCIUM  --  7.6* 7.3*   GFR: Estimated Creatinine Clearance: 26.4 mL/min (A) (by C-G formula based on SCr of 2.21 mg/dL (H)). Liver Function Tests: Recent Labs  Lab 10/10/23 1642 10/11/23 0315  AST 172* 144*  ALT 109* 93*  ALKPHOS 112 113  BILITOT 3.7* 3.4*  PROT 9.3* 8.2*  ALBUMIN <1.5* <1.5*   Recent Labs  Lab 10/10/23 1642   LIPASE 33   No results for input(s): "AMMONIA" in the last 168 hours. Coagulation Profile: Recent Labs  Lab 10/10/23 1642 10/11/23 0315  INR 2.1* 2.2*   Cardiac Enzymes: No results for input(s): "CKTOTAL", "CKMB", "CKMBINDEX", "TROPONINI" in the last 168 hours. BNP (last 3 results) No results for input(s): "PROBNP" in the last 8760 hours. HbA1C: No results for input(s): "HGBA1C" in the last 72 hours. CBG: Recent Labs  Lab 10/10/23 1921 10/10/23 2346 10/11/23 0327 10/11/23 0747 10/11/23 0831  GLUCAP 71 91 76 69* 118*   Lipid Profile: No results for input(s): "CHOL", "HDL", "LDLCALC", "TRIG", "CHOLHDL", "LDLDIRECT" in the last 72 hours. Thyroid Function Tests: No results for input(s): "TSH", "T4TOTAL", "FREET4", "T3FREE", "THYROIDAB" in the last 72 hours. Anemia Panel: No  results for input(s): "VITAMINB12", "FOLATE", "FERRITIN", "TIBC", "IRON", "RETICCTPCT" in the last 72 hours. Sepsis Labs: Recent Labs  Lab 10/10/23 1721 10/10/23 1921  LATICACIDVEN 2.0* 2.4*    No results found for this or any previous visit (from the past 240 hours).       Radiology Studies: CT ABDOMEN PELVIS W CONTRAST Result Date: 10/10/2023 CLINICAL DATA:  Generalized abdominal pain EXAM: CT ABDOMEN AND PELVIS WITH CONTRAST TECHNIQUE: Multidetector CT imaging of the abdomen and pelvis was performed using the standard protocol following bolus administration of intravenous contrast. RADIATION DOSE REDUCTION: This exam was performed according to the departmental dose-optimization program which includes automated exposure control, adjustment of the mA and/or kV according to patient size and/or use of iterative reconstruction technique. CONTRAST:  60mL OMNIPAQUE IOHEXOL 350 MG/ML SOLN COMPARISON:  CT scan from 2019 and abdominal ultrasound 09/20/2023 FINDINGS: Lower chest: The lung bases are clear of acute process. No pleural effusion or pulmonary lesions. The heart is normal in size. No pericardial  effusion. Stable aortic and coronary artery calcifications. The distal esophagus and aorta are unremarkable. Hepatobiliary: Progressive cirrhotic changes involving the liver. Associated portal venous hypertension, portal venous collaterals, paraesophageal varices, mesenteric edema and small volume ascites. There is a heterogeneously enhancing 4 x 4 cm right hepatic lobe mass in segment 6. Findings highly suspicious for hepatoma. MRI abdomen without and with contrast is recommended for more definitive evaluation. No intrahepatic biliary dilatation. The portal and hepatic veins are patent. Calcified gallstones are noted in the gallbladder. The gallbladder wall is markedly thickened. This could reflect acute cholecystitis or changes related to cirrhosis. Right upper quadrant ultrasound may be helpful for further evaluation. No common bile duct dilatation. Pancreas: Unremarkable. No pancreatic ductal dilatation or surrounding inflammatory changes. Spleen: Normal size.  No focal lesions.  The splenic vein is patent. Adrenals/Urinary Tract: Heterogeneously enhancing right adrenal gland mass measures 3.8 x 2.2 cm. The left adrenal gland is normal. Numerous bilateral nonenhancing renal cysts not requiring any further imaging evaluation or follow-up. Poor excretion of contrast through the kidneys is noted on the delayed images. Recommend correlation with renal function. The bladder is grossly normal. Stomach/Bowel: The stomach, duodenum and small are grossly normal without contrast. No inflammatory changes, mass lesions or obstructive findings. Right colonic wall thickening may be due to adjacent ascites and the patient's cirrhosis. Colonic diverticulosis with focal area acute perforated diverticulitis involving the sigmoid colon. Multiple small locules of gas and significant surrounding inflammatory changes. No discrete drainable abscess. Vascular/Lymphatic: Age advanced atherosclerotic calcification involving the aorta,  iliac arteries and branch vessels but no aneurysm. The major venous structures are patent. Scattered upper abdominal lymph nodes typical with cirrhosis. No retroperitoneal mass or adenopathy. No pelvic adenopathy. Reproductive: The uterus and ovaries are unremarkable. Other: Mesenteric edema, small volume abdominal/pelvic ascites and diffuse body wall edema. Musculoskeletal: No significant findings. Chronic changes of bilateral hip AVN. IMPRESSION: 1. Progressive cirrhotic changes involving the liver with associated portal venous hypertension, portal venous collaterals, paraesophageal varices, mesenteric edema and small volume ascites. 2. Heterogeneously enhancing 4 x 4 cm right hepatic lobe mass in segment 6. Findings highly suspicious for hepatoma. MRI abdomen without and with contrast is recommended for more definitive evaluation. 3. Heterogeneously enhancing right adrenal gland mass measures 3.8 x 2.2 cm. Findings are concerning for metastatic disease. 4. Cholelithiasis with marked gallbladder wall thickening. This could reflect acute cholecystitis or changes related to cirrhosis. Right upper quadrant ultrasound may be helpful for further evaluation. 5. Colonic diverticulosis with  focal area of acute perforated diverticulitis involving the sigmoid colon. Multiple small locules of gas and significant surrounding inflammatory changes. No discrete drainable abscess. 6. Poor excretion of contrast through the kidneys on the delayed images. Recommend correlation with renal function. 7. Age advanced atherosclerotic calcification involving the aorta, iliac arteries and branch vessels. 8. Aortic atherosclerosis. These results will be called to the ordering clinician or representative by the Radiologist Assistant, and communication documented in the PACS or [employer]. Electronically Signed   By: Rudie Meyer M.D.   On: 10/10/2023 09:24           LOS: 1 day   Time spent= 35 mins    Miguel Rota,  MD [facility]  If 7PM-7AM, please contact night-coverage  10/11/2023, 10:50 AM

## 2023-10-11 NOTE — Consult Note (Signed)
Consult Note  Dana Thornton [birth year]  [id number].    Requesting MD: Dr. Cody Cullens Chief Complaint/Reason for Consult: diverticulitis   HPI:  68 y.o. 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 Bosie Clos showed diverticulitis.  CT scan was also notable for progressive cirrhosis, paraesophageal varices, mesenteric edema, ascites, hepatic mass, right adrenal gland mass.  She is drowsy this a.m. and history is obtained from chart review and her husband who is at the bedside.  Patient had been having pelvic and suprapubic abdominal pain for approximately 3 weeks.  She had associated low appetite and increased pain with p.o. intake and therefore was not eating much.  She had not been having nausea until presentation to the ED when she developed nausea and vomiting x 2.  She denied fever, chills.  As above, she presented to her GI specialist due to the symptoms with the above findings and has been admitted to the hospitalist service for further evaluation.  GI and oncology have been consulted inpatient as well.  At the time of my exam she continues to have stable abdominal pain.  She denies nausea or vomiting today.  Her last bowel movement was several days ago and bowel movements have been irregular secondary to low p.o. intake.  She has started to feel hungry again today.  She has never had an episode of diverticulitis previously.  She last had a colonoscopy in 2020.  Substance use: denies Blood thinners: none Past Surgeries: cesarean section   ROS: Reviewed and as above  Family History  Problem Relation Age of Onset   Kidney failure Father    Hypertension Father     Past Medical History:  Diagnosis Date   Hypercholesterolemia    Hypertension    Kidney stones    Type II  diabetes mellitus (HCC)     Past Surgical History:  Procedure Laterality Date   BREAST EXCISIONAL BIOPSY Left    CESAREAN SECTION  1999   DILATION AND CURETTAGE OF UTERUS     "I lost a baby"   IR RADIOLOGIST EVAL & MGMT  11/21/2017   IR RADIOLOGIST EVAL & MGMT  12/03/2017   TUBAL LIGATION  1999    Social History:  reports that she has never smoked. She has never used smokeless tobacco. She reports that she does not drink alcohol and does not use drugs.  Allergies:  Allergies  Allergen Reactions   Ace Inhibitors Cough   Codeine Nausea And Vomiting and Other (See Comments)    GI upset   Statins Other (See Comments)    contraindicated due to liver disease    Medications Prior to Admission  Medication Sig Dispense Refill   ALEVE 220 MG tablet Take 220-440 mg by mouth 2 (two) times daily as needed (for pain or headaches).     amLODipine (NORVASC) 10 MG tablet Take 1 tablet (10 mg total) by mouth daily. 30 tablet 1   carvedilol (COREG) 25 MG tablet Take 25 mg by mouth 2 (two) times daily with a meal.     ezetimibe (ZETIA) 10 MG tablet Take 10 mg by mouth daily.     furosemide (LASIX) 40 MG tablet Take 40 mg by mouth daily as needed for fluid or edema.     insulin glargine (LANTUS) 100 UNIT/ML injection 30  Units sq Qhs and  12 units q am (Patient taking differently: Inject 10 Units into the skin at bedtime as needed (for elevated BGL).) 10 mL 2   insulin lispro (HUMALOG KWIKPEN) 100 UNIT/ML KwikPen Inject 10 Units into the skin 3 (three) times daily with meals as needed (AS DIRECTED FOR AN ELEVATED BGL).     levothyroxine (SYNTHROID) 300 MCG tablet Take 150-300 mcg by mouth See admin instructions. Take 150 mcg by mouth 30 minutes before breakfast on Sunday and Saturday and 300 mcg on Mon/Tues/Wed/Thurs/Fri     losartan (COZAAR) 100 MG tablet Take 100 mg by mouth daily.     mercaptopurine (PURINETHOL) 50 MG tablet Take 50 mg by mouth daily. Give on an empty stomach 1 hour before or 2 hours  after meals. Caution: Chemotherapy.     ondansetron (ZOFRAN) 4 MG tablet Take 4 mg by mouth every 6 (six) hours as needed for nausea or vomiting.     predniSONE (DELTASONE) 10 MG tablet Take 2 tablets (20 mg) daily with food for 4 days, then 1 tablet (10 mg) daily with food until seen by your GI doctor (Patient taking differently: Take 10 mg by mouth daily with breakfast.) 30 tablet 1   spironolactone (ALDACTONE) 25 MG tablet Take 25 mg by mouth daily as needed (AS DIRECTED).     triamcinolone cream (KENALOG) 0.1 % Apply 1 Application topically 2 (two) times daily as needed (for irritation, as directed- affected area).     TRULICITY 0.75 MG/0.5ML SOAJ Inject 0.75 mg into the skin every Sunday.     Vitamin D, Ergocalciferol, (DRISDOL) 50000 units CAPS capsule Take 50,000 Units by mouth every Sunday.     acetaminophen (TYLENOL) 325 MG tablet Take 2 tablets (650 mg total) by mouth every 6 (six) hours as needed for mild pain, moderate pain, fever or headache. (Patient not taking: Reported on 10/10/2023) 100 tablet 0   albuterol (PROVENTIL HFA;VENTOLIN HFA) 108 (90 Base) MCG/ACT inhaler Inhale 2 puffs into the lungs every 4 (four) hours as needed for wheezing or shortness of breath. (Patient not taking: Reported on 10/10/2023) 1 Inhaler 2   insulin aspart (NOVOLOG) 100 UNIT/ML injection Before each meal 3 times a day, 120-150 - 2 units, 151-200 - 4 units, 201-250 - 8 units, 251-300 - 10 units,  301 or above 10 units and call your MD (Patient not taking: Reported on 10/10/2023) 1 vial 1   lactobacillus acidophilus & bulgar (LACTINEX) chewable tablet Chew 1 tablet by mouth 3 (three) times daily with meals. (Patient not taking: Reported on 10/10/2023) 30 tablet 1   Spacer/Aero-Holding Chambers (BREATHERITE COLL SPACER ADULT) MISC 1 Device by Does not apply route every 6 (six) hours as needed. 1 each 0    Blood pressure 109/62, pulse 81, temperature (!) 97.5 F (36.4 C), resp. rate 18, height 5\' 2"  (1.575 m),  weight 94.3 kg, SpO2 100%. Physical Exam: General: pleasant, WD, female who is laying in bed in NAD HEENT: head is normocephalic, atraumatic.  Sclera are noninjected.  Pupils equal and round. EOMs intact.  Ears and nose without any masses or lesions.  Mouth is pink and moist Lungs: Respiratory effort nonlabored Abd: soft, ND, mild supapubic TTP without rebound or guarding. No RUQ pain on my exam  MSK: all 4 extremities are symmetrical with no cyanosis, clubbing, or edema. Skin: warm and dry with no masses, lesions, or rashes Neuro: Cranial nerves 2-12 grossly intact, sensation is normal throughout Psych: A&Ox3 with an appropriate affect. somnolent   Results for  orders placed or performed during the hospital encounter of 10/10/23 (from the past 48 hours)  CBC with Differential     Status: Abnormal   Collection Time: 10/10/23  4:42 PM  Result Value Ref Range   WBC 16.4 (H) 4.0 - 10.5 K/uL   RBC 3.65 (L) 3.87 - 5.11 MIL/uL   Hemoglobin 11.4 (L) 12.0 - 15.0 g/dL   HCT 16.1 (L) 09.6 - 04.5 %   MCV 87.4 80.0 - 100.0 fL   MCH 31.2 26.0 - 34.0 pg   MCHC 35.7 30.0 - 36.0 g/dL   RDW 40.9 (H) 81.1 - 91.4 %   Platelets 145 (L) 150 - 400 K/uL   nRBC 0.1 0.0 - 0.2 %   Neutrophils Relative % 71 %   Neutro Abs 11.8 (H) 1.7 - 7.7 K/uL   Lymphocytes Relative 17 %   Lymphs Abs 2.7 0.7 - 4.0 K/uL   Monocytes Relative 8 %   Monocytes Absolute 1.3 (H) 0.1 - 1.0 K/uL   Eosinophils Relative 2 %   Eosinophils Absolute 0.2 0.0 - 0.5 K/uL   Basophils Relative 1 %   Basophils Absolute 0.1 0.0 - 0.1 K/uL   Immature Granulocytes 1 %   Abs Immature Granulocytes 0.22 (H) 0.00 - 0.07 K/uL    Comment: Performed at [facility], [address], [place], [place] [address]  Comprehensive metabolic panel     Status: Abnormal   Collection Time: 10/10/23  4:42 PM  Result Value Ref Range   Sodium 131 (L) 135 - 145 mmol/L   Potassium 4.6 3.5 - 5.1 mmol/L    Comment: HEMOLYSIS AT THIS LEVEL MAY  AFFECT RESULT   Chloride 105 98 - 111 mmol/L   CO2 21 (L) 22 - 32 mmol/L   Glucose, Bld 89 70 - 99 mg/dL    Comment: Glucose reference range applies only to samples taken after fasting for at least 8 hours.   BUN 25 (H) 8 - 23 mg/dL   Creatinine, Ser 6.21 (H) 0.44 - 1.00 mg/dL   Calcium 7.6 (L) 8.9 - 10.3 mg/dL   Total Protein 9.3 (H) 6.5 - 8.1 g/dL   Albumin <3.0 (L) 3.5 - 5.0 g/dL   AST 865 (H) 15 - 41 U/L    Comment: HEMOLYSIS AT THIS LEVEL MAY AFFECT RESULT   ALT 109 (H) 0 - 44 U/L    Comment: HEMOLYSIS AT THIS LEVEL MAY AFFECT RESULT   Alkaline Phosphatase 112 38 - 126 U/L   Total Bilirubin 3.7 (H) 0.0 - 1.2 mg/dL    Comment: HEMOLYSIS AT THIS LEVEL MAY AFFECT RESULT   GFR, Estimated 26 (L) >60 mL/min    Comment: (NOTE) Calculated using the CKD-EPI Creatinine Equation (2021)    Anion gap 5 5 - 15    Comment: Performed at [facility], [address], [place], [place] [address]  Lipase, blood     Status: None   Collection Time: 10/10/23  4:42 PM  Result Value Ref Range   Lipase 33 11 - 51 U/L    Comment: Performed at [facility], [address] [place], [place] [address]  Protime-INR     Status: Abnormal   Collection Time: 10/10/23  4:42 PM  Result Value Ref Range   Prothrombin Time 23.7 (H) 11.4 - 15.2 seconds   INR 2.1 (H) 0.8 - 1.2    Comment: (NOTE) INR goal varies based on device and disease states. Performed at [facility], [address], [place], [place] [address]   Urinalysis, Routine w reflex microscopic -Urine, Clean Catch     Status: Abnormal   Collection Time: 10/10/23  4:49 PM  Result Value Ref Range   Color, Urine AMBER (A) YELLOW    Comment: BIOCHEMICALS MAY BE AFFECTED BY COLOR   APPearance HAZY (A) CLEAR   Specific Gravity, Urine 1.045 (H) 1.005 - 1.030   pH 5.0 5.0 - 8.0   Glucose, UA NEGATIVE NEGATIVE mg/dL   Hgb urine dipstick NEGATIVE NEGATIVE   Bilirubin Urine SMALL (A) NEGATIVE    Ketones, ur NEGATIVE NEGATIVE mg/dL   Protein, ur NEGATIVE NEGATIVE mg/dL   Nitrite NEGATIVE NEGATIVE   Leukocytes,Ua NEGATIVE NEGATIVE    Comment: Performed at [facility], [address], [place], [place] [address]  I-Stat CG4 Lactic Acid     Status: Abnormal   Collection Time: 10/10/23  5:21 PM  Result Value Ref Range   Lactic Acid, Venous 2.0 (HH) 0.5 - 1.9 mmol/L  I-Stat CG4 Lactic Acid     Status: Abnormal   Collection Time: 10/10/23  7:21 PM  Result Value Ref Range   Lactic Acid, Venous 2.4 (HH) 0.5 - 1.9 mmol/L   Comment NOTIFIED PHYSICIAN   CBG monitoring, ED     Status: None   Collection Time: 10/10/23  7:21 PM  Result Value Ref Range   Glucose-Capillary 71 70 - 99 mg/dL    Comment: Glucose reference range applies only to samples taken after fasting for at least 8 hours.  Glucose, capillary     Status: None   Collection Time: 10/10/23 11:46 PM  Result Value Ref Range   Glucose-Capillary 91 70 - 99 mg/dL    Comment: Glucose reference range applies only to samples taken after fasting for at least 8 hours.  HIV Antibody (routine testing w rflx)     Status: None   Collection Time: 10/11/23  3:15 AM  Result Value Ref Range   HIV Screen 4th Generation wRfx Non Reactive Non Reactive    Comment: Performed at [facility], [address] [place], [place] [address]  Comprehensive metabolic panel     Status: Abnormal   Collection Time: 10/11/23  3:15 AM  Result Value Ref Range   Sodium 129 (L) 135 - 145 mmol/L   Potassium 4.6 3.5 - 5.1 mmol/L   Chloride 105 98 - 111 mmol/L   CO2 21 (L) 22 - 32 mmol/L   Glucose, Bld 80 70 - 99 mg/dL    Comment: Glucose reference range applies only to samples taken after fasting for at least 8 hours.   BUN 25 (H) 8 - 23 mg/dL   Creatinine, Ser 8.46 (H) 0.44 - 1.00 mg/dL   Calcium 7.3 (L) 8.9 - 10.3 mg/dL   Total Protein 8.2 (H) 6.5 - 8.1 g/dL   Albumin <9.6 (L) 3.5 - 5.0 g/dL   AST 295 (H) 15 - 41 U/L   ALT 93 (H) 0  - 44 U/L   Alkaline Phosphatase 113 38 - 126 U/L   Total Bilirubin 3.4 (H) 0.0 - 1.2 mg/dL   GFR, Estimated 24 (L) >60 mL/min    Comment: (NOTE) Calculated using the CKD-EPI Creatinine Equation (2021)    Anion gap 3 (L) 5 - 15    Comment: Performed at [facility], [address] [place], [place] [address]  CBC     Status: Abnormal   Collection Time: 10/11/23  3:15 AM  Result Value Ref Range   WBC 14.0 (H) 4.0 - 10.5 K/uL   RBC 3.15 (L) 3.87 - 5.11 MIL/uL   Hemoglobin 9.8 (L) 12.0 - 15.0 g/dL   HCT 45.4 (L) 09.8 - 11.9 %   MCV 88.9 80.0 - 100.0 fL   MCH 31.1 26.0 - 34.0 pg   MCHC 35.0 30.0 - 36.0 g/dL   RDW 14.7 (H) 82.9 - 56.2 %   Platelets 123 (L) 150 - 400 K/uL    Comment: REPEATED TO VERIFY   nRBC 0.0 0.0 - 0.2 %    Comment: Performed at [facility], [address] [place], [place] [address]  Protime-INR     Status: Abnormal   Collection Time: 10/11/23  3:15 AM  Result Value Ref Range   Prothrombin Time 24.9 (H) 11.4 - 15.2 seconds   INR 2.2 (H) 0.8 - 1.2    Comment: (NOTE) INR goal varies based on device and disease states. Performed at [facility], [address] [place], [place] [address]   Glucose, capillary     Status: None   Collection Time: 10/11/23  3:27 AM  Result Value Ref Range   Glucose-Capillary 76 70 - 99 mg/dL    Comment: Glucose reference range applies only to samples taken after fasting for at least 8 hours.  Glucose, capillary     Status: Abnormal   Collection Time: 10/11/23  7:47 AM  Result Value Ref Range   Glucose-Capillary 69 (L) 70 - 99 mg/dL    Comment: Glucose reference range applies only to samples taken after fasting for at least 8 hours.  Glucose, capillary     Status: Abnormal   Collection Time: 10/11/23  8:31 AM  Result Value Ref Range   Glucose-Capillary 118 (H) 70 - 99 mg/dL    Comment: Glucose reference range applies only to samples taken after fasting for at least 8  hours.   CT ABDOMEN PELVIS W CONTRAST Result Date: 10/10/2023 CLINICAL DATA:  Generalized abdominal pain EXAM: CT ABDOMEN AND PELVIS WITH CONTRAST TECHNIQUE: Multidetector CT imaging of the abdomen and pelvis was performed using the standard protocol following bolus administration of intravenous contrast. RADIATION DOSE REDUCTION: This exam was performed according to the departmental dose-optimization program which includes automated exposure control, adjustment of the mA and/or kV according to patient size and/or use of iterative reconstruction technique. CONTRAST:  60mL OMNIPAQUE IOHEXOL 350 MG/ML SOLN COMPARISON:  CT scan from 2019 and abdominal ultrasound 09/20/2023 FINDINGS: Lower chest: The lung bases are clear of acute process. No pleural effusion or pulmonary lesions. The heart is normal in size. No pericardial effusion. Stable aortic and coronary artery calcifications. The distal esophagus and aorta are unremarkable. Hepatobiliary: Progressive cirrhotic changes involving the liver. Associated portal venous hypertension, portal venous collaterals, paraesophageal varices, mesenteric edema and small volume ascites. There is a heterogeneously enhancing 4 x 4 cm right hepatic lobe mass in segment 6. Findings highly suspicious for hepatoma. MRI abdomen without and with contrast is recommended for more definitive evaluation. No intrahepatic biliary dilatation. The portal and hepatic veins are patent. Calcified gallstones are noted in the gallbladder. The gallbladder wall is markedly thickened. This could reflect acute cholecystitis or changes related to cirrhosis. Right upper quadrant ultrasound may be helpful for further evaluation. No common bile duct dilatation. Pancreas: Unremarkable. No pancreatic ductal dilatation or surrounding inflammatory changes. Spleen: Normal size.  No focal lesions.  The splenic vein is patent. Adrenals/Urinary Tract: Heterogeneously enhancing  right adrenal gland mass measures 3.8  x 2.2 cm. The left adrenal gland is normal. Numerous bilateral nonenhancing renal cysts not requiring any further imaging evaluation or follow-up. Poor excretion of contrast through the kidneys is noted on the delayed images. Recommend correlation with renal function. The bladder is grossly normal. Stomach/Bowel: The stomach, duodenum and small are grossly normal without contrast. No inflammatory changes, mass lesions or obstructive findings. Right colonic wall thickening may be due to adjacent ascites and the patient's cirrhosis. Colonic diverticulosis with focal area acute perforated diverticulitis involving the sigmoid colon. Multiple small locules of gas and significant surrounding inflammatory changes. No discrete drainable abscess. Vascular/Lymphatic: Age advanced atherosclerotic calcification involving the aorta, iliac arteries and branch vessels but no aneurysm. The major venous structures are patent. Scattered upper abdominal lymph nodes typical with cirrhosis. No retroperitoneal mass or adenopathy. No pelvic adenopathy. Reproductive: The uterus and ovaries are unremarkable. Other: Mesenteric edema, small volume abdominal/pelvic ascites and diffuse body wall edema. Musculoskeletal: No significant findings. Chronic changes of bilateral hip AVN. IMPRESSION: 1. Progressive cirrhotic changes involving the liver with associated portal venous hypertension, portal venous collaterals, paraesophageal varices, mesenteric edema and small volume ascites. 2. Heterogeneously enhancing 4 x 4 cm right hepatic lobe mass in segment 6. Findings highly suspicious for hepatoma. MRI abdomen without and with contrast is recommended for more definitive evaluation. 3. Heterogeneously enhancing right adrenal gland mass measures 3.8 x 2.2 cm. Findings are concerning for metastatic disease. 4. Cholelithiasis with marked gallbladder wall thickening. This could reflect acute cholecystitis or changes related to cirrhosis. Right upper  quadrant ultrasound may be helpful for further evaluation. 5. Colonic diverticulosis with focal area of acute perforated diverticulitis involving the sigmoid colon. Multiple small locules of gas and significant surrounding inflammatory changes. No discrete drainable abscess. 6. Poor excretion of contrast through the kidneys on the delayed images. Recommend correlation with renal function. 7. Age advanced atherosclerotic calcification involving the aorta, iliac arteries and branch vessels. 8. Aortic atherosclerosis. These results will be called to the ordering clinician or representative by the Radiologist Assistant, and communication documented in the PACS or [employer]. Electronically Signed   By: Rudie Meyer M.D.   On: 10/10/2023 09:24      Assessment/Plan Sigmoid diverticulitis with microperforation  Patient seen and examined and relevant labs and imaging personally reviewed.  She has sigmoid diverticulitis with microperforation without abscess.  Continue IV antibiotics and bowel rest today with sips of clears.  Her workup has also been significant for hepatic lobe mass, right adrenal gland mass concerning for metastatic disease.  Additionally she has cirrhosis with progressive cirrhotic changes on recent CT scan with portal venous hypertension, collaterals, paraesophageal varices and ascites.  She is a MELD score of 34 and Child Pugh Class C with abdominal surgery perioperative mortality of 82%.  She has significantly elevated morbidity/mortality risks in regard to abdominal surgery and recommend continuing conservative management.  Should she acutely worsen or fail to progress recommend repeat imaging to evaluate for the development of abscess and the possibility of percutaneous drainage should one develop.   FEN: sips ID: [id number] VTE: SCDs, held in setting of anemia, thrombocytopenia  Liver mass with adrenal mets - oncology following History of cirrhosis Anemia/thrombocytopenia   HTN T2DM  I reviewed ED provider notes, Consultant GI, oncology notes, hospitalist notes, last 24 h vitals and pain scores, last 48 h intake and output, last 24 h labs and trends, and last 24 h imaging results.   Eric Form,  PA-C [facility] 10/11/2023, 11:10 AM Please see Amion for pager number during day hours 7:00am-4:30pm

## 2023-10-11 NOTE — Progress Notes (Signed)
[facility] Progress Note  SUBJECTIVE:   Interval history: Dana Thornton was seen and evaluated today at bedside. Spouse at bedside. Appears somewhat lethargic today, though will awaken to spoken name and interact in exam. She noted having nausea, vomiting today. She is tolerating ice chips. She is having similar supra-pubic abdominal pain.   Past Medical History:  Diagnosis Date   Hypercholesterolemia    Hypertension    Kidney stones    Type II diabetes mellitus (HCC)    Past Surgical History:  Procedure Laterality Date   BREAST EXCISIONAL BIOPSY Left    CESAREAN SECTION  1999   DILATION AND CURETTAGE OF UTERUS     "I lost a baby"   IR RADIOLOGIST EVAL & MGMT  11/21/2017   IR RADIOLOGIST EVAL & MGMT  12/03/2017   TUBAL LIGATION  1999   Current Facility-Administered Medications  Medication Dose Route Frequency Provider Last Rate Last Admin   acetaminophen (TYLENOL) suppository 650 mg  650 mg Rectal Q6H PRN Charlsie Quest, MD       acetaminophen (TYLENOL) tablet 650 mg  650 mg Oral Q6H PRN Amin, Ankit C, MD       albumin human 25 % solution 25 g  25 g Intravenous Q6H Amin, Ankit C, MD 60 mL/hr at 10/11/23 1641 25 g at 10/11/23 1641   dextrose 5 % and 0.45 % NaCl infusion   Intravenous Continuous Amin, Ankit C, MD 75 mL/hr at 10/11/23 0845 New Bag at 10/11/23 0845   dextrose 50 % solution 12.5 g  12.5 g Intravenous STAT Patel, Vishal R, MD       dextrose 50 % solution 25 mL  25 mL Intravenous Once Amin, Ankit C, MD       glucagon (human recombinant) (GLUCAGEN) injection 1 mg  1 mg Intravenous PRN Amin, Ankit C, MD       guaiFENesin (ROBITUSSIN) 100 MG/5ML liquid 5 mL  5 mL Oral Q4H PRN Amin, Ankit C, MD       hydrALAZINE (APRESOLINE) injection 10 mg  10 mg Intravenous Q4H PRN Amin, Ankit C, MD       HYDROmorphone (DILAUDID) injection 0.5 mg  0.5 mg Intravenous Q3H PRN Darreld Mclean R, MD   0.5 mg at 10/11/23 1230   insulin aspart (novoLOG) injection 0-9 Units  0-9 Units  Subcutaneous Q4H Patel, Vishal R, MD       ipratropium-albuterol (DUONEB) 0.5-2.5 (3) MG/3ML nebulizer solution 3 mL  3 mL Nebulization Q4H PRN Amin, Ankit C, MD       [START ON 10/12/2023] levothyroxine (SYNTHROID) tablet 150 mcg  150 mcg Oral Once per day on Sunday Saturday Charlsie Quest, MD       levothyroxine (SYNTHROID) tablet 300 mcg  300 mcg Oral Once per day on Monday Tuesday Wednesday Thursday Friday Charlsie Quest, MD       metoprolol tartrate (LOPRESSOR) injection 5 mg  5 mg Intravenous Q4H PRN Amin, Ankit C, MD       ondansetron (ZOFRAN) injection 4 mg  4 mg Intravenous Q6H PRN Darreld Mclean R, MD   4 mg at 10/11/23 1318   piperacillin-tazobactam (ZOSYN) IVPB 3.375 g  3.375 g Intravenous Q8H Cherylin Mylar, RPH 12.5 mL/hr at 10/11/23 0842 3.375 g at 10/11/23 0842   senna-docusate (Senokot-S) tablet 1 tablet  1 tablet Oral QHS PRN Amin, Ankit C, MD       traZODone (DESYREL) tablet 50 mg  50 mg Oral QHS PRN Amin, Ankit  C, MD       Allergies as of 10/10/2023 - Review Complete 10/10/2023  Allergen Reaction Noted   Ace inhibitors Cough 10/10/2023   Codeine Nausea And Vomiting and Other (See Comments) 03/28/2015   Statins Other (See Comments) 10/10/2023   Review of Systems:  Review of Systems  Respiratory:  Negative for shortness of breath.   Cardiovascular:  Negative for chest pain.  Gastrointestinal:  Positive for abdominal pain, nausea and vomiting.    OBJECTIVE:   Temp:  [97.4 F (36.3 C)-98.2 F (36.8 C)] 97.6 F (36.4 C) (01/31 1332) Pulse Rate:  [74-86] 74 (01/31 1332) Resp:  [12-18] 18 (01/31 1332) BP: (94-115)/(53-68) 94/53 (01/31 1332) SpO2:  [96 %-100 %] 100 % (01/31 1332) Weight:  [94.3 kg] 94.3 kg (01/30 2102) Last BM Date : 10/09/23 Physical Exam Constitutional:      General: She is not in acute distress.    Appearance: She is not ill-appearing, toxic-appearing or diaphoretic.  HENT:     Mouth/Throat:     Mouth: Mucous membranes are dry.  Cardiovascular:      Rate and Rhythm: Normal rate and regular rhythm.  Pulmonary:     Effort: No respiratory distress.     Breath sounds: Normal breath sounds.  Abdominal:     General: Bowel sounds are normal. There is no distension.     Palpations: Abdomen is soft.     Tenderness: There is abdominal tenderness.  Neurological:     Mental Status: She is lethargic.     Labs: Recent Labs    10/10/23 1642 10/11/23 0315  WBC 16.4* 14.0*  HGB 11.4* 9.8*  HCT 31.9* 28.0*  PLT 145* 123*   BMET Recent Labs    10/10/23 0830 10/10/23 1642 10/11/23 0315  NA  --  131* 129*  K  --  4.6 4.6  CL  --  105 105  CO2  --  21* 21*  GLUCOSE  --  89 80  BUN  --  25* 25*  CREATININE 2.10* 2.03* 2.21*  CALCIUM  --  7.6* 7.3*   LFT Recent Labs    10/11/23 0315  PROT 8.2*  ALBUMIN <1.5*  AST 144*  ALT 93*  ALKPHOS 113  BILITOT 3.4*   PT/INR Recent Labs    10/10/23 1642 10/11/23 0315  LABPROT 23.7* 24.9*  INR 2.1* 2.2*   Diagnostic imaging: CT ABDOMEN PELVIS W CONTRAST Result Date: 10/10/2023 CLINICAL DATA:  Generalized abdominal pain EXAM: CT ABDOMEN AND PELVIS WITH CONTRAST TECHNIQUE: Multidetector CT imaging of the abdomen and pelvis was performed using the standard protocol following bolus administration of intravenous contrast. RADIATION DOSE REDUCTION: This exam was performed according to the departmental dose-optimization program which includes automated exposure control, adjustment of the mA and/or kV according to patient size and/or use of iterative reconstruction technique. CONTRAST:  60mL OMNIPAQUE IOHEXOL 350 MG/ML SOLN COMPARISON:  CT scan from 2019 and abdominal ultrasound 09/20/2023 FINDINGS: Lower chest: The lung bases are clear of acute process. No pleural effusion or pulmonary lesions. The heart is normal in size. No pericardial effusion. Stable aortic and coronary artery calcifications. The distal esophagus and aorta are unremarkable. Hepatobiliary: Progressive cirrhotic changes  involving the liver. Associated portal venous hypertension, portal venous collaterals, paraesophageal varices, mesenteric edema and small volume ascites. There is a heterogeneously enhancing 4 x 4 cm right hepatic lobe mass in segment 6. Findings highly suspicious for hepatoma. MRI abdomen without and with contrast is recommended for more definitive evaluation. No intrahepatic  biliary dilatation. The portal and hepatic veins are patent. Calcified gallstones are noted in the gallbladder. The gallbladder wall is markedly thickened. This could reflect acute cholecystitis or changes related to cirrhosis. Right upper quadrant ultrasound may be helpful for further evaluation. No common bile duct dilatation. Pancreas: Unremarkable. No pancreatic ductal dilatation or surrounding inflammatory changes. Spleen: Normal size.  No focal lesions.  The splenic vein is patent. Adrenals/Urinary Tract: Heterogeneously enhancing right adrenal gland mass measures 3.8 x 2.2 cm. The left adrenal gland is normal. Numerous bilateral nonenhancing renal cysts not requiring any further imaging evaluation or follow-up. Poor excretion of contrast through the kidneys is noted on the delayed images. Recommend correlation with renal function. The bladder is grossly normal. Stomach/Bowel: The stomach, duodenum and small are grossly normal without contrast. No inflammatory changes, mass lesions or obstructive findings. Right colonic wall thickening may be due to adjacent ascites and the patient's cirrhosis. Colonic diverticulosis with focal area acute perforated diverticulitis involving the sigmoid colon. Multiple small locules of gas and significant surrounding inflammatory changes. No discrete drainable abscess. Vascular/Lymphatic: Age advanced atherosclerotic calcification involving the aorta, iliac arteries and branch vessels but no aneurysm. The major venous structures are patent. Scattered upper abdominal lymph nodes typical with cirrhosis. No  retroperitoneal mass or adenopathy. No pelvic adenopathy. Reproductive: The uterus and ovaries are unremarkable. Other: Mesenteric edema, small volume abdominal/pelvic ascites and diffuse body wall edema. Musculoskeletal: No significant findings. Chronic changes of bilateral hip AVN. IMPRESSION: 1. Progressive cirrhotic changes involving the liver with associated portal venous hypertension, portal venous collaterals, paraesophageal varices, mesenteric edema and small volume ascites. 2. Heterogeneously enhancing 4 x 4 cm right hepatic lobe mass in segment 6. Findings highly suspicious for hepatoma. MRI abdomen without and with contrast is recommended for more definitive evaluation. 3. Heterogeneously enhancing right adrenal gland mass measures 3.8 x 2.2 cm. Findings are concerning for metastatic disease. 4. Cholelithiasis with marked gallbladder wall thickening. This could reflect acute cholecystitis or changes related to cirrhosis. Right upper quadrant ultrasound may be helpful for further evaluation. 5. Colonic diverticulosis with focal area of acute perforated diverticulitis involving the sigmoid colon. Multiple small locules of gas and significant surrounding inflammatory changes. No discrete drainable abscess. 6. Poor excretion of contrast through the kidneys on the delayed images. Recommend correlation with renal function. 7. Age advanced atherosclerotic calcification involving the aorta, iliac arteries and branch vessels. 8. Aortic atherosclerosis. These results will be called to the ordering clinician or representative by the Radiologist Assistant, and communication documented in the PACS or [employer]. Electronically Signed   By: Rudie Meyer M.D.   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             -On  mercaptopurine prior to admission             -On furosemide 40 mg PO daily PRN and spironolactone 25 mg PO daily PRN prior to admission, on hold currently  -MELD 3.0: 34 at 10/11/2023  3:15 AM Normocytic anemia Right hepatic lobe, 4 x 4 cm mass suspicious for hepatoma Right adrenal mass concerning for metastatic disease   PLAN: -Appreciate General Surgery recommendations, sips of clear liquids  -MRI abdomen with and without contrast ordered to further evaluate liver lesion and adrenal mass -IV antibiotics -Receiving IV albumin currently, monitor urine  output, monitor renal function -[facility] will follow   LOS: 1 day   Liliane Shi, [facility]

## 2023-10-11 NOTE — Progress Notes (Signed)
PHARMACY NOTE -  Zosyn  Pharmacy has been assisting with dosing of Zosyn for perforated diverticulitis. Dosage remains stable at 3.375 g IV q8 hr and further renal adjustments per institutional Pharmacy antibiotic protocol  Pharmacy will sign off, following peripherally for culture results, dose adjustments, and length of therapy. Please reconsult if a change in clinical status warrants re-evaluation of dosage.  Bernadene Person, PharmD, BCPS [phone] 10/11/2023, 10:50 AM

## 2023-10-12 ENCOUNTER — Inpatient Hospital Stay (HOSPITAL_COMMUNITY): Payer: Medicare Other

## 2023-10-12 DIAGNOSIS — K5720 Diverticulitis of large intestine with perforation and abscess without bleeding: Secondary | ICD-10-CM | POA: Diagnosis not present

## 2023-10-12 LAB — GLUCOSE, CAPILLARY
Glucose-Capillary: 136 mg/dL — ABNORMAL HIGH (ref 70–99)
Glucose-Capillary: 100 mg/dL — ABNORMAL HIGH (ref 70–99)
Glucose-Capillary: 74 mg/dL (ref 70–99)
Glucose-Capillary: 91 mg/dL (ref 70–99)
Glucose-Capillary: 88 mg/dL (ref 70–99)
Glucose-Capillary: 118 mg/dL — ABNORMAL HIGH (ref 70–99)
Glucose-Capillary: 70 mg/dL (ref 70–99)
Glucose-Capillary: 100 mg/dL — ABNORMAL HIGH (ref 70–99)

## 2023-10-12 LAB — COMPREHENSIVE METABOLIC PANEL
ALT: 87 U/L — ABNORMAL HIGH (ref 0–44)
AST: 140 U/L — ABNORMAL HIGH (ref 15–41)
Albumin: 2.1 g/dL — ABNORMAL LOW (ref 3.5–5.0)
Alkaline Phosphatase: 72 U/L (ref 38–126)
Anion gap: 7 (ref 5–15)
BUN: 30 mg/dL — ABNORMAL HIGH (ref 8–23)
CO2: 20 mmol/L — ABNORMAL LOW (ref 22–32)
Calcium: 7.6 mg/dL — ABNORMAL LOW (ref 8.9–10.3)
Chloride: 108 mmol/L (ref 98–111)
Creatinine, Ser: 2.69 mg/dL — ABNORMAL HIGH (ref 0.44–1.00)
GFR, Estimated: 19 mL/min — ABNORMAL LOW (ref >60)
Glucose, Bld: 101 mg/dL — ABNORMAL HIGH (ref 70–99)
Potassium: 4.1 mmol/L (ref 3.5–5.1)
Sodium: 135 mmol/L (ref 135–145)
Total Bilirubin: 3.3 mg/dL — ABNORMAL HIGH (ref 0.0–1.2)
Total Protein: 7.5 g/dL (ref 6.5–8.1)

## 2023-10-12 LAB — CBC
HCT: 24.7 % — ABNORMAL LOW (ref 36.0–46.0)
Hemoglobin: 8.6 g/dL — ABNORMAL LOW (ref 12.0–15.0)
MCH: 31.7 pg (ref 26.0–34.0)
MCHC: 34.8 g/dL (ref 30.0–36.0)
MCV: 91.1 fL (ref 80.0–100.0)
Platelets: 93 10*3/uL — ABNORMAL LOW (ref 150–400)
RBC: 2.71 MIL/uL — ABNORMAL LOW (ref 3.87–5.11)
RDW: 16.3 % — ABNORMAL HIGH (ref 11.5–15.5)
WBC: 9.4 10*3/uL (ref 4.0–10.5)
nRBC: 0 % (ref 0.0–0.2)

## 2023-10-12 LAB — MAGNESIUM: Magnesium: 2 mg/dL (ref 1.7–2.4)

## 2023-10-12 LAB — PHOSPHORUS: Phosphorus: 4.1 mg/dL (ref 2.5–4.6)

## 2023-10-12 LAB — AFP TUMOR MARKER: AFP, Serum, Tumor Marker: 458 ng/mL — ABNORMAL HIGH (ref 0.0–9.2)

## 2023-10-12 MED ORDER — GADOBUTROL 1 MMOL/ML IV SOLN
9.0000 mL | Freq: Once | INTRAVENOUS | Status: AC | PRN
  Administered 2023-10-12: 9 mL via INTRAVENOUS

## 2023-10-12 MED ORDER — ALPRAZOLAM 0.5 MG PO TABS
1.0000 mg | ORAL_TABLET | Freq: Once | ORAL | Status: AC
  Administered 2023-10-12: 1 mg via ORAL
  Filled 2023-10-12: qty 2

## 2023-10-12 MED ORDER — IBUPROFEN 200 MG PO TABS: 200.0000 mg | ORAL_TABLET | Freq: Once | ORAL | Status: DC | PRN

## 2023-10-12 MED ORDER — ALBUMIN HUMAN 25 % IV SOLN
25.0000 g | Freq: Four times a day (QID) | INTRAVENOUS | Status: AC
  Administered 2023-10-12 – 2023-10-13 (×4): 25 g via INTRAVENOUS
  Filled 2023-10-12 (×4): qty 100

## 2023-10-12 MED ORDER — DEXTROSE 50 % IV SOLN
25.0000 g | INTRAVENOUS | Status: AC
  Administered 2023-10-12: 25 g via INTRAVENOUS

## 2023-10-12 MED ORDER — SODIUM CHLORIDE 0.9 % IV SOLN: INTRAVENOUS | Status: DC

## 2023-10-12 MED ORDER — DEXTROSE-SODIUM CHLORIDE 5-0.9 % IV SOLN: INTRAVENOUS | Status: DC

## 2023-10-12 MED ORDER — ALPRAZOLAM 0.5 MG PO TABS: 0.5000 mg | ORAL_TABLET | Freq: Once | ORAL | Status: DC

## 2023-10-12 MED ORDER — DEXTROSE 50 % IV SOLN: 12.5000 g | INTRAVENOUS | Status: DC

## 2023-10-12 NOTE — Progress Notes (Signed)
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  Patient has been abdominal pain outpatient for several days therefore was referred to outpatient GI who ended up getting CT scan which showed abnormal findings and eventually referred to the hospital.   Assessment & Plan:  Principal Problem:   Perforation of sigmoid colon due to diverticulitis Active Problems:   AKI (acute kidney injury) (HCC)   Hepatic cirrhosis (HCC)   Liver mass, right lobe   Autoimmune hepatitis (HCC)   Insulin dependent type 2 diabetes mellitus (HCC)   Hypertension associated with diabetes (HCC)   Mass of right adrenal gland (HCC)   Cholelithiasis   Hyponatremia   Normocytic anemia   Hypothyroidism   Hyperlipidemia associated with type 2 diabetes mellitus (HCC)    Acute sigmoid diverticulitis with perforation: -This is seen on the CT scan.  Currently on IV Zosyn, NPO.  Conservative management    Acute kidney injury: Creatinine 2.03 on admission, baseline creatinine 1.3.  Creatinine trending up.  Continue IV fluids, albumin.   Right hepatic lobe mass Right adrenal gland mass: A heterogeneously enhancing 4 x 4 cm right hepatic lobe mass was seen on CT, highly suspicious for hepatoma.  MRI abdomen w/wo recommended for further evaluation.  Also noted to have a 3.8 x 2.2 cm heterogeneously enhancing right adrenal gland mass concerning for metastatic disease.   -AFP-elevated - MRCP-pending -Oncology consulted   Hepatic cirrhosis with portal venous  hypertension, paraesophageal varices, ascites History of autoimmune hepatitis: Signs of decompensation.  On outpatient mercaptopurine.  Management per GI    Cholelithiasis with gallbladder wall thickening: Possibly secondary to cirrhosis.  Will see what general surgery has to say about this   Hyponatremia: From possible dehydration and cirrhosis.  Gentle hydration  Lactic acidosis - Improved with IV fluids   Insulin-dependent type 2 diabetes: -Sliding scale and Accu-Cheks   Hypertension: Currently on hold.  IV as needed   Normocytic anemia: Stay well hemoglobin around 11   Hypothyroidism: On Synthroid   Hyperlipidemia: Holding Zetia.  DVT prophylaxis: SCDs    Code Status: Full Code Family Communication: Husband at bedside.  Spoke to the daughter over the phone Status is: Inpatient Remains inpatient appropriate because: On going eval for multiple GI issues    Subjective: Patient is adamant that she wants to try eating something as abdominal pain is controlled.  Examination:  General exam: Appears calm and comfortable  Respiratory system: Clear to auscultation. Respiratory effort normal. Cardiovascular system: S1 & S2 heard, RRR. No JVD, murmurs, rubs, gallops or clicks. No pedal edema. Gastrointestinal system: Abdomen is nondistended, soft and nontender. No organomegaly or masses felt. Normal bowel sounds heard. Central nervous system: Alert and oriented. No focal neurological deficits. Extremities: Symmetric 5 x 5 power. Skin: No rashes, lesions or ulcers Psychiatry: Judgement and insight appear normal. Mood & affect appropriate.                Diet Orders (From admission, onward)     Start     Ordered   10/10/23 1939  Diet NPO time specified Except for: [employer], Sips with Meds  Diet effective now       Question Answer Comment  Except for Ice Chips   Except for Sips with Meds      10/10/23 1938            Objective: Vitals:   10/11/23  2206 10/12/23 0101 10/12/23 0500 10/12/23 0516  BP: (!) 108/58 107/60  (!) 107/59  Pulse: 80 75  73  Resp: 17   15  Temp: (!) 97.5 F (36.4 C) (!) 97.5 F (36.4 C)  97.6 F (36.4 C)  TempSrc: Oral Oral  Oral  SpO2: 98% 100%  100%  Weight:   91.6 kg   Height:        Intake/Output Summary (Last 24 hours) at 10/12/2023 1055 Last data filed at 10/12/2023 0800 Gross per 24 hour  Intake 1725.83 ml  Output 301 ml  Net 1424.83 ml   Filed Weights   10/10/23 1617 10/10/23 2102 10/12/23 0500  Weight: 92 kg 94.3 kg 91.6 kg    Scheduled Meds:  insulin aspart  0-9 Units Subcutaneous Q4H   levothyroxine  150 mcg Oral Once per day on Sunday Saturday   levothyroxine  300 mcg Oral Once per day on Monday Tuesday Wednesday Thursday Friday   Continuous Infusions:  albumin human 25 g (10/12/23 0824)   dextrose 5 % and 0.9 % NaCl 100 mL/hr at 10/12/23 0813   piperacillin-tazobactam (ZOSYN)  IV 3.375 g (10/12/23 0227)    Nutritional status     Body mass index is 36.95 kg/m.  Data Reviewed:   CBC: Recent Labs  Lab 10/10/23 1642 10/11/23 0315 10/12/23 0309  WBC 16.4* 14.0* 9.4  NEUTROABS 11.8*  --   --   HGB 11.4* 9.8* 8.6*  HCT 31.9* 28.0* 24.7*  MCV 87.4 88.9 91.1  PLT 145* 123* 93*   Basic Metabolic Panel: Recent Labs  Lab 10/10/23 0830 10/10/23 1642 10/11/23 0315 10/12/23 0309  NA  --  131* 129* 135  K  --  4.6 4.6 4.1  CL  --  105 105 108  CO2  --  21* 21* 20*  GLUCOSE  --  89 80 101*  BUN  --  25* 25* 30*  CREATININE 2.10* 2.03* 2.21* 2.69*  CALCIUM  --  7.6* 7.3* 7.6*  MG  --   --   --  2.0  PHOS  --   --   --  4.1   GFR: Estimated Creatinine Clearance: 21.4 mL/min (A) (by C-G formula based on SCr of 2.69 mg/dL (H)). Liver Function Tests: Recent Labs  Lab 10/10/23 1642 10/11/23 0315 10/12/23 0309  AST 172* 144* 140*  ALT 109* 93* 87*  ALKPHOS 112 113 72  BILITOT 3.7* 3.4* 3.3*  PROT 9.3* 8.2* 7.5  ALBUMIN <1.5* <1.5* 2.1*   Recent Labs  Lab  10/10/23 1642  LIPASE 33   Recent Labs  Lab 10/11/23 1123  AMMONIA 30   Coagulation Profile: Recent Labs  Lab 10/10/23 1642 10/11/23 0315  INR 2.1* 2.2*   Cardiac Enzymes: No results for input(s): "CKTOTAL", "CKMB", "CKMBINDEX", "TROPONINI" in the last 168 hours. BNP (last 3 results) No results for input(s): "PROBNP" in the last 8760 hours. HbA1C: No results for input(s): "HGBA1C" in the last 72 hours. CBG: Recent Labs  Lab 10/11/23 1952 10/11/23 2340 10/12/23 0102 10/12/23 0343 10/12/23 0746  GLUCAP 82 68* 136* 100* 74   Lipid Profile: No results for input(s): "CHOL", "HDL", "LDLCALC", "TRIG", "CHOLHDL", "LDLDIRECT" in the last 72 hours. Thyroid  Function Tests: No results for input(s): "TSH", "T4TOTAL", "FREET4", "T3FREE", "THYROIDAB" in the last 72 hours. Anemia Panel: No results for input(s): "VITAMINB12", "FOLATE", "FERRITIN", "TIBC", "IRON", "RETICCTPCT" in the last 72 hours. Sepsis Labs: Recent Labs  Lab 10/10/23 1721 10/10/23 1921  LATICACIDVEN 2.0* 2.4*    No results found for this or any previous visit (from the past 240 hours).       Radiology Studies: No results found.         LOS: 2 days   Time spent= 35 mins    Miguel Rota, MD [facility]  If 7PM-7AM, please contact night-coverage  10/12/2023, 10:55 AM

## 2023-10-12 NOTE — Plan of Care (Signed)
   Problem: Coping: Goal: Level of anxiety will decrease Outcome: Progressing

## 2023-10-12 NOTE — Progress Notes (Signed)
Episode of Hypoglycemia,:  0045 CBG 68.  Pt Is NPO.  Pt showed no symptoms of hypoglycemia.  Nurse notified NP and 25 g of D 50 was ordered and given.  CBG recheck is rechecked at 0102 was 136. Pt continues on  D5 NS at 75.

## 2023-10-12 NOTE — Progress Notes (Signed)
[facility] Progress Note  SUBJECTIVE:   Interval history: Dana Thornton was seen and evaluated today at bedside. Spouse at bedside. Nursing at bedside checking vitals as patient appeared more withdrawn/not as awake. Vitals stable. Patient woke easily to sternal rub, oriented to person, place and time. Noted that abdominal pain improved. Noted that she felt exhausted. No chest pain or shortness of breath. No bowel movement, passing gas.   Past Medical History:  Diagnosis Date   Hypercholesterolemia    Hypertension    Kidney stones    Type II diabetes mellitus (HCC)    Past Surgical History:  Procedure Laterality Date   BREAST EXCISIONAL BIOPSY Left    CESAREAN SECTION  1999   DILATION AND CURETTAGE OF UTERUS     "I lost a baby"   IR RADIOLOGIST EVAL & MGMT  11/21/2017   IR RADIOLOGIST EVAL & MGMT  12/03/2017   TUBAL LIGATION  1999   Current Facility-Administered Medications  Medication Dose Route Frequency Provider Last Rate Last Admin   acetaminophen (TYLENOL) suppository 650 mg  650 mg Rectal Q6H PRN Charlsie Quest, MD       acetaminophen (TYLENOL) tablet 650 mg  650 mg Oral Q6H PRN Amin, Ankit C, MD       albumin human 25 % solution 25 g  25 g Intravenous Q6H Amin, Ankit C, MD 60 mL/hr at 10/12/23 1523 25 g at 10/12/23 1523   dextrose 5 %-0.9 % sodium chloride infusion   Intravenous Continuous Amin, Ankit C, MD 100 mL/hr at 10/12/23 0813 New Bag at 10/12/23 0813   glucagon (human recombinant) (GLUCAGEN) injection 1 mg  1 mg Intravenous PRN Amin, Ankit C, MD       guaiFENesin (ROBITUSSIN) 100 MG/5ML liquid 5 mL  5 mL Oral Q4H PRN Amin, Ankit C, MD       hydrALAZINE (APRESOLINE) injection 10 mg  10 mg Intravenous Q4H PRN Amin, Ankit C, MD       HYDROmorphone (DILAUDID) injection 0.5 mg  0.5 mg Intravenous Q3H PRN Darreld Mclean R, MD   0.5 mg at 10/11/23 1230   insulin aspart (novoLOG) injection 0-9 Units  0-9 Units Subcutaneous Q4H Patel, Vishal R, MD        ipratropium-albuterol (DUONEB) 0.5-2.5 (3) MG/3ML nebulizer solution 3 mL  3 mL Nebulization Q4H PRN Amin, Ankit C, MD       levothyroxine (SYNTHROID) tablet 150 mcg  150 mcg Oral Once per day on Sunday Saturday Darreld Mclean R, MD   150 mcg at 10/12/23 1019   levothyroxine (SYNTHROID) tablet 300 mcg  300 mcg Oral Once per day on Monday Tuesday Wednesday Thursday Friday Charlsie Quest, MD       metoprolol tartrate (LOPRESSOR) injection 5 mg  5 mg Intravenous Q4H PRN Amin, Ankit C, MD       ondansetron (ZOFRAN) injection 4 mg  4 mg Intravenous Q6H PRN Darreld Mclean R, MD   4 mg at 10/11/23 1318   piperacillin-tazobactam (ZOSYN) IVPB 3.375 g  3.375 g Intravenous Q8H Cherylin Mylar, RPH 12.5 mL/hr at 10/12/23 1057 3.375 g at 10/12/23 1057   senna-docusate (Senokot-S) tablet 1 tablet  1 tablet Oral QHS PRN Amin, Ankit C, MD       traZODone (DESYREL) tablet 50 mg  50 mg Oral QHS PRN Amin, Ankit C, MD       Allergies as of 10/10/2023 - Review Complete 10/10/2023  Allergen Reaction Noted   Ace inhibitors Cough 10/10/2023  Codeine Nausea And Vomiting and Other (See Comments) 03/28/2015   Statins Other (See Comments) 10/10/2023   Review of Systems:  Review of Systems  Respiratory:  Negative for shortness of breath.   Cardiovascular:  Negative for chest pain.  Gastrointestinal:  Positive for abdominal pain. Negative for constipation and diarrhea.    OBJECTIVE:   Temp:  [97.1 F (36.2 C)-97.7 F (36.5 C)] 97.1 F (36.2 C) (02/01 1732) Pulse Rate:  [73-80] 76 (02/01 1732) Resp:  [15-18] 18 (02/01 1400) BP: (104-108)/(54-60) 104/56 (02/01 1732) SpO2:  [96 %-100 %] 99 % (02/01 1732) Weight:  [91.6 kg] 91.6 kg (02/01 0500) Last BM Date : 10/09/23 Physical Exam Constitutional:      General: She is not in acute distress.    Appearance: She is not ill-appearing, toxic-appearing or diaphoretic.  Cardiovascular:     Rate and Rhythm: Normal rate and regular rhythm.  Pulmonary:     Effort: No  respiratory distress.     Breath sounds: Normal breath sounds.  Abdominal:     General: Bowel sounds are normal. There is no distension.     Palpations: Abdomen is soft.     Tenderness: There is abdominal tenderness. There is no guarding.  Neurological:     Mental Status: She is alert.     Labs: Recent Labs    10/10/23 1642 10/11/23 0315 10/12/23 0309  WBC 16.4* 14.0* 9.4  HGB 11.4* 9.8* 8.6*  HCT 31.9* 28.0* 24.7*  PLT 145* 123* 93*   BMET Recent Labs    10/10/23 1642 10/11/23 0315 10/12/23 0309  NA 131* 129* 135  K 4.6 4.6 4.1  CL 105 105 108  CO2 21* 21* 20*  GLUCOSE 89 80 101*  BUN 25* 25* 30*  CREATININE 2.03* 2.21* 2.69*  CALCIUM 7.6* 7.3* 7.6*   LFT Recent Labs    10/12/23 0309  PROT 7.5  ALBUMIN 2.1*  AST 140*  ALT 87*  ALKPHOS 72  BILITOT 3.3*   PT/INR Recent Labs    10/10/23 1642 10/11/23 0315  LABPROT 23.7* 24.9*  INR 2.1* 2.2*   Diagnostic imaging: No results found.  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             -On  mercaptopurine prior to admission             -On furosemide and aldactone PRN prior to admission, currently on hold              -MELD 3.0: 34 at 10/11/2023  3:15 AM Normocytic anemia Acute kidney injury Right hepatic lobe, 4 x 4 cm mass suspicious for hepatoma Right adrenal mass concerning for metastatic disease  PLAN: -Continue supportive care for diverticulitis including IV antibiotic therapy, can advance from NPO to soft diet once MRI abdomen completed -Somewhat hypoglycemic per nursing staff, pending oral glucose administration per nursing -Hold on diuretic therapy in setting of acute kidney injury -Will need close outpatient follow up with Dr. Bosie Clos on discharge [facility] will follow    LOS: 2 days   Liliane Shi, [facility]

## 2023-10-12 NOTE — Progress Notes (Signed)
   Subjective/Chief Complaint: Feels better   Objective: Vital signs in last 24 hours: Temp:  [97.5 F (36.4 C)-97.6 F (36.4 C)] 97.6 F (36.4 C) (02/01 0516) Pulse Rate:  [73-80] 73 (02/01 0516) Resp:  [15-18] 15 (02/01 0516) BP: (94-108)/(53-60) 107/59 (02/01 0516) SpO2:  [98 %-100 %] 100 % (02/01 0516) Weight:  [91.6 kg] 91.6 kg (02/01 0500) Last BM Date : 10/09/23  Intake/Output from previous day: 01/31 0701 - 02/01 0700 In: 2000.4 [I.V.:1115.2; IV Piggyback:885.2] Out: 301 [Urine:301] Intake/Output this shift: Total I/O In: 65.2 [IV Piggyback:65.2] Out: -   Ab nondistended, not really tender much except very focally in llq  Lab Results:  Recent Labs    10/11/23 0315 10/12/23 0309  WBC 14.0* 9.4  HGB 9.8* 8.6*  HCT 28.0* 24.7*  PLT 123* 93*   BMET Recent Labs    10/11/23 0315 10/12/23 0309  NA 129* 135  K 4.6 4.1  CL 105 108  CO2 21* 20*  GLUCOSE 80 101*  BUN 25* 30*  CREATININE 2.21* 2.69*  CALCIUM 7.3* 7.6*   PT/INR Recent Labs    10/10/23 1642 10/11/23 0315  LABPROT 23.7* 24.9*  INR 2.1* 2.2*   ABG No results for input(s): "PHART", "HCO3" in the last 72 hours.  Invalid input(s): "PCO2", "PO2"  Studies/Results: No results found.  Anti-infectives: Anti-infectives (From admission, onward)    Start     Dose/Rate Route Frequency Ordered Stop   10/11/23 0100  piperacillin-tazobactam (ZOSYN) IVPB 3.375 g        3.375 g 12.5 mL/hr over 240 Minutes Intravenous Every 8 hours 10/10/23 1959     10/10/23 1700  piperacillin-tazobactam (ZOSYN) IVPB 3.375 g        3.375 g 12.5 mL/hr over 240 Minutes Intravenous  Once 10/10/23 1651 10/10/23 2113       Assessment/Plan: Sigmoid diverticulitis with microperforation   -better on abx, -diet per gi has an mr liver today -no plan for any surgery for her here -will see again Monday   FEN: sips ID: [id number] VTE: SCDs, held in setting of anemia, thrombocytopenia   Liver mass with adrenal mets  - oncology following History of cirrhosis Anemia/thrombocytopenia  HTN T2DM  I reviewed last 24 h vitals and pain scores, last 48 h intake and output, last 24 h labs and trends, and last 24 h imaging results.     Dana Thornton 10/12/2023

## 2023-10-13 ENCOUNTER — Inpatient Hospital Stay (HOSPITAL_COMMUNITY): Payer: Medicare Other

## 2023-10-13 DIAGNOSIS — K5720 Diverticulitis of large intestine with perforation and abscess without bleeding: Secondary | ICD-10-CM | POA: Diagnosis not present

## 2023-10-13 LAB — GLUCOSE, CAPILLARY
Glucose-Capillary: 90 mg/dL (ref 70–99)
Glucose-Capillary: 102 mg/dL — ABNORMAL HIGH (ref 70–99)
Glucose-Capillary: 153 mg/dL — ABNORMAL HIGH (ref 70–99)
Glucose-Capillary: 143 mg/dL — ABNORMAL HIGH (ref 70–99)
Glucose-Capillary: 150 mg/dL — ABNORMAL HIGH (ref 70–99)

## 2023-10-13 LAB — CBC
HCT: 24 % — ABNORMAL LOW (ref 36.0–46.0)
Hemoglobin: 8.4 g/dL — ABNORMAL LOW (ref 12.0–15.0)
MCH: 31.3 pg (ref 26.0–34.0)
MCHC: 35 g/dL (ref 30.0–36.0)
MCV: 89.6 fL (ref 80.0–100.0)
Platelets: 88 10*3/uL — ABNORMAL LOW (ref 150–400)
RBC: 2.68 MIL/uL — ABNORMAL LOW (ref 3.87–5.11)
RDW: 15.9 % — ABNORMAL HIGH (ref 11.5–15.5)
WBC: 7.5 10*3/uL (ref 4.0–10.5)
nRBC: 0.3 % — ABNORMAL HIGH (ref 0.0–0.2)

## 2023-10-13 LAB — COMPREHENSIVE METABOLIC PANEL
ALT: 68 U/L — ABNORMAL HIGH (ref 0–44)
AST: 105 U/L — ABNORMAL HIGH (ref 15–41)
Albumin: 2.6 g/dL — ABNORMAL LOW (ref 3.5–5.0)
Alkaline Phosphatase: 60 U/L (ref 38–126)
Anion gap: 8 (ref 5–15)
BUN: 24 mg/dL — ABNORMAL HIGH (ref 8–23)
CO2: 19 mmol/L — ABNORMAL LOW (ref 22–32)
Calcium: 7.7 mg/dL — ABNORMAL LOW (ref 8.9–10.3)
Chloride: 110 mmol/L (ref 98–111)
Creatinine, Ser: 2.29 mg/dL — ABNORMAL HIGH (ref 0.44–1.00)
GFR, Estimated: 23 mL/min — ABNORMAL LOW (ref >60)
Glucose, Bld: 88 mg/dL (ref 70–99)
Potassium: 4.2 mmol/L (ref 3.5–5.1)
Sodium: 137 mmol/L (ref 135–145)
Total Bilirubin: 3.4 mg/dL — ABNORMAL HIGH (ref 0.0–1.2)
Total Protein: 6.9 g/dL (ref 6.5–8.1)

## 2023-10-13 LAB — MAGNESIUM: Magnesium: 1.9 mg/dL (ref 1.7–2.4)

## 2023-10-13 MED ORDER — CARMEX CLASSIC LIP BALM EX OINT: TOPICAL_OINTMENT | CUTANEOUS | Status: DC | PRN

## 2023-10-13 MED ORDER — METRONIDAZOLE 500 MG/100ML IV SOLN
500.0000 mg | Freq: Two times a day (BID) | INTRAVENOUS | Status: DC
  Administered 2023-10-13 – 2023-10-15 (×5): 500 mg via INTRAVENOUS
  Filled 2023-10-13 (×5): qty 100

## 2023-10-13 MED ORDER — SODIUM CHLORIDE 0.9 % IV SOLN
2.0000 g | Freq: Every day | INTRAVENOUS | Status: DC
  Administered 2023-10-13 – 2023-10-15 (×3): 2 g via INTRAVENOUS
  Filled 2023-10-13 (×3): qty 20

## 2023-10-13 MED ORDER — WHITE PETROLATUM EX OINT: TOPICAL_OINTMENT | CUTANEOUS | Status: DC | PRN

## 2023-10-13 MED ORDER — DEXTROSE-SODIUM CHLORIDE 5-0.9 % IV SOLN: INTRAVENOUS | Status: DC

## 2023-10-13 MED ORDER — INSULIN ASPART 100 UNIT/ML IJ SOLN: 0.0000 [IU] | Freq: Every day | INTRAMUSCULAR | Status: DC

## 2023-10-13 MED ORDER — INSULIN ASPART 100 UNIT/ML IJ SOLN
0.0000 [IU] | Freq: Three times a day (TID) | INTRAMUSCULAR | Status: DC
  Administered 2023-10-14 (×2): 1 [IU] via SUBCUTANEOUS

## 2023-10-13 NOTE — Progress Notes (Signed)
[facility] Progress Note  SUBJECTIVE:   Interval history: Dana Thornton was seen and evaluated today at bedside. Resting comfortably in bed with spouse at bedside and daughter on telephone. Discussed findings of MRI abdomen concerning for hepatocellular carcinoma and either separate adrenal primary versus metastatic disease. We discussed that further workup/evaluation is warranted from Oncology.   She noted that her abdominal pain is subsiding. She denied nausea/vomiting. She is tolerating clear liquids. No bowel movement. No chest pain or shortness of breath.   Past Medical History:  Diagnosis Date   Hypercholesterolemia    Hypertension    Kidney stones    Type II diabetes mellitus (HCC)    Past Surgical History:  Procedure Laterality Date   BREAST EXCISIONAL BIOPSY Left    CESAREAN SECTION  1999   DILATION AND CURETTAGE OF UTERUS     "I lost a baby"   IR RADIOLOGIST EVAL & MGMT  11/21/2017   IR RADIOLOGIST EVAL & MGMT  12/03/2017   TUBAL LIGATION  1999   Current Facility-Administered Medications  Medication Dose Route Frequency Provider Last Rate Last Admin   acetaminophen (TYLENOL) suppository 650 mg  650 mg Rectal Q6H PRN Charlsie Quest, MD       acetaminophen (TYLENOL) tablet 650 mg  650 mg Oral Q6H PRN Amin, Ankit C, MD       ALPRAZolam Prudy Feeler) tablet 0.5 mg  0.5 mg Oral Once Luiz Iron, NP       dextrose 5 %-0.9 % sodium chloride infusion   Intravenous Continuous Amin, Ankit C, MD 75 mL/hr at 10/13/23 0835 New Bag at 10/13/23 0835   glucagon (human recombinant) (GLUCAGEN) injection 1 mg  1 mg Intravenous PRN Amin, Ankit C, MD       guaiFENesin (ROBITUSSIN) 100 MG/5ML liquid 5 mL  5 mL Oral Q4H PRN Amin, Ankit C, MD       hydrALAZINE (APRESOLINE) injection 10 mg  10 mg Intravenous Q4H PRN Amin, Ankit C, MD       HYDROmorphone (DILAUDID) injection 0.5 mg  0.5 mg Intravenous Q3H PRN Darreld Mclean R, MD   0.5 mg at 10/12/23 2012   ibuprofen (ADVIL) tablet 200  mg  200 mg Oral Once PRN Luiz Iron, NP       insulin aspart (novoLOG) injection 0-9 Units  0-9 Units Subcutaneous Q4H Patel, Vishal R, MD       ipratropium-albuterol (DUONEB) 0.5-2.5 (3) MG/3ML nebulizer solution 3 mL  3 mL Nebulization Q4H PRN Amin, Ankit C, MD       levothyroxine (SYNTHROID) tablet 150 mcg  150 mcg Oral Once per day on Sunday Saturday Charlsie Quest, MD   150 mcg at 10/13/23 1610   levothyroxine (SYNTHROID) tablet 300 mcg  300 mcg Oral Once per day on Monday Tuesday Wednesday Thursday Friday Charlsie Quest, MD       metoprolol tartrate (LOPRESSOR) injection 5 mg  5 mg Intravenous Q4H PRN Amin, Ankit C, MD       ondansetron (ZOFRAN) injection 4 mg  4 mg Intravenous Q6H PRN Darreld Mclean R, MD   4 mg at 10/11/23 1318   piperacillin-tazobactam (ZOSYN) IVPB 3.375 g  3.375 g Intravenous Q8H Cherylin Mylar, RPH 12.5 mL/hr at 10/13/23 0441 3.375 g at 10/13/23 0441   senna-docusate (Senokot-S) tablet 1 tablet  1 tablet Oral QHS PRN Amin, Ankit C, MD       traZODone (DESYREL) tablet 50 mg  50 mg Oral QHS PRN Amin,  Ankit C, MD       Allergies as of 10/10/2023 - Review Complete 10/10/2023  Allergen Reaction Noted   Ace inhibitors Cough 10/10/2023   Codeine Nausea And Vomiting and Other (See Comments) 03/28/2015   Statins Other (See Comments) 10/10/2023   Review of Systems:  Review of Systems  Respiratory:  Negative for shortness of breath.   Cardiovascular:  Negative for chest pain.  Gastrointestinal:  Positive for abdominal pain. Negative for constipation, diarrhea, nausea and vomiting.    OBJECTIVE:   Temp:  [97.1 F (36.2 C)-98.5 F (36.9 C)] 98.5 F (36.9 C) (02/02 0503) Pulse Rate:  [73-76] 73 (02/02 0503) Resp:  [15-18] 16 (02/02 0503) BP: (104-125)/(54-62) 125/62 (02/02 0503) SpO2:  [96 %-100 %] 100 % (02/02 0503) Weight:  [93.7 kg] 93.7 kg (02/02 0503) Last BM Date : 10/09/23 Physical Exam Constitutional:      General: She is not in acute distress.     Appearance: She is not ill-appearing, toxic-appearing or diaphoretic.  Cardiovascular:     Rate and Rhythm: Normal rate and regular rhythm.  Pulmonary:     Effort: No respiratory distress.     Breath sounds: Normal breath sounds.  Abdominal:     General: Bowel sounds are normal. There is no distension.     Palpations: Abdomen is soft.     Tenderness: There is no abdominal tenderness. There is no guarding.  Neurological:     Mental Status: She is alert.     Labs: Recent Labs    10/11/23 0315 10/12/23 0309 10/13/23 0319  WBC 14.0* 9.4 7.5  HGB 9.8* 8.6* 8.4*  HCT 28.0* 24.7* 24.0*  PLT 123* 93* 88*   BMET Recent Labs    10/11/23 0315 10/12/23 0309 10/13/23 0319  NA 129* 135 137  K 4.6 4.1 4.2  CL 105 108 110  CO2 21* 20* 19*  GLUCOSE 80 101* 88  BUN 25* 30* 24*  CREATININE 2.21* 2.69* 2.29*  CALCIUM 7.3* 7.6* 7.7*   LFT Recent Labs    10/13/23 0319  PROT 6.9  ALBUMIN 2.6*  AST 105*  ALT 68*  ALKPHOS 60  BILITOT 3.4*   PT/INR Recent Labs    10/10/23 1642 10/11/23 0315  LABPROT 23.7* 24.9*  INR 2.1* 2.2*   Diagnostic imaging: MR ABDOMEN W WO CONTRAST Addendum Date: 10/13/2023 ADDENDUM REPORT: 10/13/2023 09:34 Electronically Signed   By: Jearld Lesch M.D.   On: 10/13/2023 09:34   Result Date: 10/13/2023 CLINICAL DATA:  Characterize right lobe liver mass, cirrhosis, right adrenal mass EXAM: MRI ABDOMEN WITHOUT AND WITH CONTRAST TECHNIQUE: Multiplanar multisequence MR imaging of the abdomen was performed both before and after the administration of intravenous contrast. CONTRAST:  9mL GADAVIST GADOBUTROL 1 MMOL/ML IV SOLN COMPARISON:  CT abdomen pelvis, 10/10/2023, CT abdomen pelvis, 12/03/2017 FINDINGS: Examination is generally limited by breath motion artifact throughout, particularly multiphasic contrast enhanced sequences. Lower chest: No acute abnormality. Hepatobiliary: Coarse, nodular cirrhotic morphology of the liver. Subtly hyperenhancing mass of the  inferior right lobe of the liver, hepatic segment VI, with clear evidence of washout but without capsular enhancement, measuring 3.4 x 3.3 cm (series 23, image 56). Layering sludge and small gallstones (series 25, image 70). Gallbladder wall thickening and small volume pericholecystic fluid, nonspecific in the setting of ascites. No biliary ductal dilatation Pancreas: Unremarkable. No pancreatic ductal dilatation or surrounding inflammatory changes. Spleen: Normal in size without significant abnormality. Adrenals/Urinary Tract: Heterogeneously enhancing mass of the right adrenal gland measuring 4.1  x 2.3 cm. Normal left adrenal gland. Numerous bilateral renal cortical cysts, for which no further follow-up or characterization is required. Kidneys are otherwise normal, without obvious renal calculi, solid lesion, or hydronephrosis. Stomach/Bowel: Stomach is within normal limits. No evidence of bowel wall thickening, distention, or inflammatory changes. Vascular/Lymphatic: Aortic atherosclerosis. No enlarged abdominal lymph nodes. Other: No abdominal wall hernia. Anasarca. Small volume ascites throughout the abdomen. Musculoskeletal: No acute or significant osseous findings. IMPRESSION: 1. Examination is generally limited by breath motion artifact throughout, particularly multiphasic contrast enhanced sequences. 2. Within this limitation, there is a subtly hyperenhancing mass of the inferior right lobe of the liver, hepatic segment VI, with clear evidence of washout but without capsular enhancement, measuring 3.4 x 3.3 cm. This is consistent with hepatocellular carcinoma, LI-RADS category 5. 3. Heterogeneously enhancing mass of the right adrenal gland measuring 4.1 x 2.3 cm, new compared to prior examination dated 12/05/2017 and highly concerning for malignancy. Isolated adrenal metastasis of hepatocellular carcinoma is rare but has been reported in the literature. Normal left adrenal gland. 4. Cirrhosis and small  volume ascites. 5. Layering sludge and small gallstones. Gallbladder wall thickening and small volume pericholecystic fluid, nonspecific in the setting of ascites. No biliary ductal dilatation. 6. Anasarca. Aortic Atherosclerosis (ICD10-I70.0). Electronically Signed: By: Jearld Lesch M.D. 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             -On  mercaptopurine prior to admission             -On furosemide and aldactone PRN prior to admission, currently on hold              -MELD 3.0: 34 at 10/11/2023  3:15 AM Right hepatic lobe, 4 x 4 cm mass suspicious for hepatocellular carcinoma based on MRI imaging  -Elevated alpha-fetal protein  -Right adrenal mass either second primary cancer versus metastatic disease  -If adrenal lesion is metastatic disease, this would disqualify her for liver transplantation based on Milan criteria  Normocytic anemia Acute kidney injury   PLAN: -Supportive care for diverticulitis, appears to be slowing improving  -Pending further evaluation/recommendations from Oncology team -Holding diuretic therapy in setting of acute kidney injury -Recommend outpatient evaluation with Dr. Bosie Clos for decompensated cirrhosis and likely hepatocellular carcinoma, will place information in chart -[facility] will follow, Dr. Ewing Schlein to see tomorrow   LOS: 3 days   Liliane Shi, DO [facility]

## 2023-10-13 NOTE — Progress Notes (Signed)
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  Patient has been abdominal pain outpatient for several days therefore was referred to outpatient GI who ended up getting CT scan which showed abnormal findings and eventually referred to the hospital.   Assessment & Plan:  Principal Problem:   Perforation of sigmoid colon due to diverticulitis Active Problems:   AKI (acute kidney injury) (HCC)   Hepatic cirrhosis (HCC)   Liver mass, right lobe   Autoimmune hepatitis (HCC)   Insulin dependent type 2 diabetes mellitus (HCC)   Hypertension associated with diabetes (HCC)   Mass of right adrenal gland (HCC)   Cholelithiasis   Hyponatremia   Normocytic anemia   Hypothyroidism   Hyperlipidemia associated with type 2 diabetes mellitus (HCC)    Acute sigmoid diverticulitis with perforation: -This is seen on the CT scan.  Diet as tolerated.  Change antibiotics to Rocephin and Flagyl    Acute kidney injury: Creatinine 2.03 on admission, baseline creatinine 1.3.  Creatinine peaked 2.69.  Continue IV fluids   Right hepatic lobe mass Right adrenal gland mass: A heterogeneously enhancing 4 x 4 cm right hepatic lobe mass was seen on CT, highly suspicious for hepatoma.  MRI abdomen is concerning for HCC.  Will discuss with oncology regarding any further staging or workup. -AFP-elevated - MRCP-concerning for HCC.  Will discuss with oncology. -Oncology consulted   Hepatic cirrhosis with portal venous hypertension, paraesophageal varices,  ascites History of autoimmune hepatitis: Signs of decompensation.  On outpatient mercaptopurine.  Management per GI    Cholelithiasis with gallbladder wall thickening: Possibly secondary to cirrhosis.  Will see what general surgery has to say about this   Hyponatremia: Improved From possible dehydration and cirrhosis.  Gentle hydration  Lactic acidosis - Improved with IV fluids   Insulin-dependent type 2 diabetes: -Sliding scale and Accu-Cheks   Hypertension: Currently on hold.  IV as needed   Normocytic anemia: Stay well hemoglobin around 9   Hypothyroidism: On Synthroid   Hyperlipidemia: Holding Zetia.  DVT prophylaxis: SCDs    Code Status: Full Code Family Communication: Husband at bedside.  Video call with daughter.  Status is: Inpatient Remains inpatient appropriate because: On going eval for multiple GI issues    Subjective: Patient seen and examined at bedside.  Overall still weak appearing. Had extensive discussion regarding MRI findings.  Patient's husband at bedside and daughter over the phone as well.  Examination:  General exam: Appears calm and comfortable  Respiratory system: Clear to auscultation. Respiratory effort normal. Cardiovascular system: S1 & S2 heard, RRR. No JVD, murmurs, rubs, gallops or clicks. No pedal edema. Gastrointestinal system: Abdomen is nondistended, soft and nontender. No organomegaly or masses felt. Normal bowel sounds heard. Central nervous system: Alert and oriented. No focal neurological deficits. Extremities: Symmetric 5 x 5 power. Skin: No rashes, lesions or ulcers Psychiatry: Judgement and insight appear normal. Mood & affect appropriate.                Diet Orders (From admission, onward)     Start     Ordered   10/13/23 0756  DIET SOFT Room  service appropriate? Yes; Fluid consistency: Thin  Diet effective now       Question Answer Comment  Room service appropriate? Yes   Fluid consistency: Thin       10/13/23 0755            Objective: Vitals:   10/12/23 1400 10/12/23 1732 10/12/23 2330 10/13/23 0503  BP: (!) 105/54 (!) 104/56 117/61 125/62  Pulse: 76 76 73 73  Resp: 18  15 16   Temp: 97.7 F (36.5 C) (!) 97.1 F (36.2 C) 97.9 F (36.6 C) 98.5 F (36.9 C)  TempSrc:  Oral Oral   SpO2: 96% 99% 100% 100%  Weight:    93.7 kg  Height:        Intake/Output Summary (Last 24 hours) at 10/13/2023 1210 Last data filed at 10/13/2023 0600 Gross per 24 hour  Intake 915.02 ml  Output 350 ml  Net 565.02 ml   Filed Weights   10/10/23 2102 10/12/23 0500 10/13/23 0503  Weight: 94.3 kg 91.6 kg 93.7 kg    Scheduled Meds:  ALPRAZolam  0.5 mg Oral Once   insulin aspart  0-9 Units Subcutaneous Q4H   levothyroxine  150 mcg Oral Once per day on Sunday Saturday   levothyroxine  300 mcg Oral Once per day on Monday Tuesday Wednesday Thursday Friday   Continuous Infusions:  dextrose 5 % and 0.9 % NaCl 75 mL/hr at 10/13/23 0835   piperacillin-tazobactam (ZOSYN)  IV 3.375 g (10/13/23 0441)    Nutritional status     Body mass index is 37.78 kg/m.  Data Reviewed:   CBC: Recent Labs  Lab 10/10/23 1642 10/11/23 0315 10/12/23 0309 10/13/23 0319  WBC 16.4* 14.0* 9.4 7.5  NEUTROABS 11.8*  --   --   --   HGB 11.4* 9.8* 8.6* 8.4*  HCT 31.9* 28.0* 24.7* 24.0*  MCV 87.4 88.9 91.1 89.6  PLT 145* 123* 93* 88*   Basic Metabolic Panel: Recent Labs  Lab 10/10/23 0830 10/10/23 1642 10/11/23 0315 10/12/23 0309 10/13/23 0319  NA  --  131* 129* 135 137  K  --  4.6 4.6 4.1 4.2  CL  --  105 105 108 110  CO2  --  21* 21* 20* 19*  GLUCOSE  --  89 80 101* 88  BUN  --  25* 25* 30* 24*  CREATININE 2.10* 2.03* 2.21* 2.69* 2.29*  CALCIUM  --  7.6* 7.3* 7.6* 7.7*  MG  --   --   --  2.0 1.9  PHOS  --   --   --  4.1  --    GFR: Estimated Creatinine Clearance: 25.4 mL/min (A) (by C-G formula based on SCr of 2.29 mg/dL (H)). Liver Function Tests: Recent Labs  Lab 10/10/23 1642  10/11/23 0315 10/12/23 0309 10/13/23 0319  AST 172* 144* 140* 105*  ALT 109* 93* 87* 68*  ALKPHOS 112 113 72 60  BILITOT 3.7* 3.4* 3.3* 3.4*  PROT 9.3* 8.2* 7.5 6.9  ALBUMIN <1.5* <1.5* 2.1* 2.6*   Recent Labs  Lab 10/10/23 1642  LIPASE 33   Recent Labs  Lab 10/11/23 1123  AMMONIA 30   Coagulation Profile: Recent Labs  Lab 10/10/23 1642 10/11/23 0315  INR 2.1* 2.2*   Cardiac Enzymes: No results for input(s): "CKTOTAL", "CKMB", "CKMBINDEX", "TROPONINI" in the last 168 hours. BNP (last 3 results) No results for input(s): "PROBNP" in the last 8760 hours. HbA1C: No results for input(s): "HGBA1C" in the last 72 hours. CBG: Recent Labs  Lab 10/12/23 1804 10/12/23 2329 10/13/23 0503 10/13/23 0754 10/13/23 1115  GLUCAP 118* 100* 90 102* 153*   Lipid Profile: No results for input(s): "CHOL", "HDL", "LDLCALC", "TRIG", "CHOLHDL", "LDLDIRECT" in the last 72 hours. Thyroid Function Tests: No results for input(s): "TSH", "T4TOTAL", "FREET4", "T3FREE", "THYROIDAB" in the last 72 hours. Anemia Panel: No results for input(s): "VITAMINB12", "FOLATE", "FERRITIN", "TIBC", "IRON", "RETICCTPCT" in the last 72 hours. Sepsis Labs: Recent Labs  Lab 10/10/23 1721 10/10/23 1921  LATICACIDVEN 2.0* 2.4*    No results found for this or any previous visit (from the past 240 hours).       Radiology Studies: MR ABDOMEN W WO CONTRAST Addendum Date: 10/13/2023 ADDENDUM REPORT: 10/13/2023 09:34 Electronically Signed   By: Jearld Lesch M.D.   On: 10/13/2023 09:34   Result Date: 10/13/2023 CLINICAL DATA:  Characterize right lobe liver mass, cirrhosis, right adrenal mass EXAM: MRI ABDOMEN WITHOUT AND WITH CONTRAST TECHNIQUE: Multiplanar multisequence MR imaging of the abdomen was performed both before and after the administration of intravenous contrast. CONTRAST:  9mL GADAVIST GADOBUTROL 1 MMOL/ML IV SOLN COMPARISON:  CT abdomen pelvis, 10/10/2023, CT abdomen pelvis, 12/03/2017 FINDINGS:  Examination is generally limited by breath motion artifact throughout, particularly multiphasic contrast enhanced sequences. Lower chest: No acute abnormality. Hepatobiliary: Coarse, nodular cirrhotic morphology of the liver. Subtly hyperenhancing mass of the inferior right lobe of the liver, hepatic segment VI, with clear evidence of washout but without capsular enhancement, measuring 3.4 x 3.3 cm (series 23, image 56). Layering sludge and small gallstones (series 25, image 70). Gallbladder wall thickening and small volume pericholecystic fluid, nonspecific in the setting of ascites. No biliary ductal dilatation Pancreas: Unremarkable. No pancreatic ductal dilatation or surrounding inflammatory changes. Spleen: Normal in size without significant abnormality. Adrenals/Urinary Tract: Heterogeneously enhancing mass of the right adrenal gland measuring 4.1 x 2.3 cm. Normal left adrenal gland. Numerous bilateral renal cortical cysts, for which no further follow-up or characterization is required. Kidneys are otherwise normal, without obvious renal calculi, solid lesion, or hydronephrosis. Stomach/Bowel: Stomach is within normal limits. No evidence of bowel wall thickening, distention, or inflammatory changes. Vascular/Lymphatic: Aortic atherosclerosis. No enlarged abdominal lymph nodes. Other: No abdominal wall hernia. Anasarca. Small volume ascites throughout the abdomen. Musculoskeletal: No acute or significant osseous findings. IMPRESSION: 1. Examination is generally limited by breath motion artifact throughout, particularly multiphasic contrast enhanced sequences. 2. Within this limitation, there is a subtly hyperenhancing mass of the inferior right lobe of the liver, hepatic segment VI, with clear evidence of washout but without capsular enhancement, measuring 3.4 x 3.3 cm. This is consistent with hepatocellular carcinoma, LI-RADS category 5. 3. Heterogeneously enhancing mass of the right adrenal gland measuring  4.1 x 2.3 cm, new compared to prior examination dated 12/05/2017 and highly concerning for malignancy. Isolated adrenal metastasis of hepatocellular carcinoma is rare but has been reported in the literature. Normal left adrenal gland. 4. Cirrhosis and small volume ascites. 5. Layering sludge and small gallstones. Gallbladder wall thickening and small volume pericholecystic fluid, nonspecific in the setting of ascites. No biliary ductal dilatation. 6. Anasarca. Aortic Atherosclerosis (ICD10-I70.0). Electronically Signed: By: Jearld Lesch M.D. On: 10/13/2023 08:15           LOS: 3 days   Time spent= 35 mins    Miguel Rota, MD [facility]  If 7PM-7AM, please contact night-coverage  10/13/2023, 12:10 PM

## 2023-10-13 NOTE — Plan of Care (Signed)
  Problem: Coping: Goal: Ability to adjust to condition or change in health will improve Outcome: Progressing   Problem: Education: Goal: Knowledge of General Education information will improve Description: Including pain rating scale, medication(s)/side effects and non-pharmacologic comfort measures Outcome: Progressing   Problem: Coping: Goal: Level of anxiety will decrease Outcome: Progressing   Problem: Pain Managment: Goal: General experience of comfort will improve and/or be controlled Outcome: Progressing

## 2023-10-14 ENCOUNTER — Inpatient Hospital Stay (HOSPITAL_COMMUNITY): Payer: Medicare Other

## 2023-10-14 DIAGNOSIS — K5720 Diverticulitis of large intestine with perforation and abscess without bleeding: Secondary | ICD-10-CM | POA: Diagnosis not present

## 2023-10-14 LAB — MAGNESIUM: Magnesium: 1.9 mg/dL (ref 1.7–2.4)

## 2023-10-14 LAB — CBC
HCT: 24 % — ABNORMAL LOW (ref 36.0–46.0)
Hemoglobin: 8.4 g/dL — ABNORMAL LOW (ref 12.0–15.0)
MCH: 31.1 pg (ref 26.0–34.0)
MCHC: 35 g/dL (ref 30.0–36.0)
MCV: 88.9 fL (ref 80.0–100.0)
Platelets: 90 10*3/uL — ABNORMAL LOW (ref 150–400)
RBC: 2.7 MIL/uL — ABNORMAL LOW (ref 3.87–5.11)
RDW: 16.2 % — ABNORMAL HIGH (ref 11.5–15.5)
WBC: 7.6 10*3/uL (ref 4.0–10.5)
nRBC: 0.7 % — ABNORMAL HIGH (ref 0.0–0.2)

## 2023-10-14 LAB — COMPREHENSIVE METABOLIC PANEL
ALT: 64 U/L — ABNORMAL HIGH (ref 0–44)
AST: 97 U/L — ABNORMAL HIGH (ref 15–41)
Albumin: 2.4 g/dL — ABNORMAL LOW (ref 3.5–5.0)
Alkaline Phosphatase: 57 U/L (ref 38–126)
Anion gap: 4 — ABNORMAL LOW (ref 5–15)
BUN: 18 mg/dL (ref 8–23)
CO2: 20 mmol/L — ABNORMAL LOW (ref 22–32)
Calcium: 7.6 mg/dL — ABNORMAL LOW (ref 8.9–10.3)
Chloride: 113 mmol/L — ABNORMAL HIGH (ref 98–111)
Creatinine, Ser: 2 mg/dL — ABNORMAL HIGH (ref 0.44–1.00)
GFR, Estimated: 27 mL/min — ABNORMAL LOW (ref >60)
Glucose, Bld: 104 mg/dL — ABNORMAL HIGH (ref 70–99)
Potassium: 4.1 mmol/L (ref 3.5–5.1)
Sodium: 137 mmol/L (ref 135–145)
Total Bilirubin: 3 mg/dL — ABNORMAL HIGH (ref 0.0–1.2)
Total Protein: 7.1 g/dL (ref 6.5–8.1)

## 2023-10-14 LAB — GLUCOSE, CAPILLARY
Glucose-Capillary: 100 mg/dL — ABNORMAL HIGH (ref 70–99)
Glucose-Capillary: 127 mg/dL — ABNORMAL HIGH (ref 70–99)
Glucose-Capillary: 117 mg/dL — ABNORMAL HIGH (ref 70–99)
Glucose-Capillary: 140 mg/dL — ABNORMAL HIGH (ref 70–99)
Glucose-Capillary: 107 mg/dL — ABNORMAL HIGH (ref 70–99)

## 2023-10-14 MED ORDER — DEXTROSE-SODIUM CHLORIDE 5-0.45 % IV SOLN: INTRAVENOUS | Status: AC

## 2023-10-14 NOTE — Progress Notes (Signed)
   10/14/23 0931  TOC Brief Assessment  Insurance and Status Reviewed  Patient has primary care physician Yes  Home environment has been reviewed home with spouse  Prior level of function: independent  Prior/Current Home Services No current home services  Social Drivers of Health Review SDOH reviewed no interventions necessary  Readmission risk has been reviewed Yes  Transition of care needs no transition of care needs at this time

## 2023-10-14 NOTE — Progress Notes (Signed)
Dana Thornton 11:04 AM  Subjective: Patient seen and examined and hospital computer chart reviewed and case discussed with my partner Dr. Lorenso Quarry and patient is not having any more pain and is tolerating her diet and is about to go for a chest CT and has no new complaints  Objective: Vital signs stable afebrile no acute distress abdomen is soft nontender MRI reviewed BUN and creatinine slight decrease LFTs stable CBC stable  Assessment: Probable HCC and improved diverticulitis  Plan: Further workup plans and treatment per oncology and biopsy if needed per them and please call me if I could be of any further assistance with this hospital stay otherwise can follow-up with her primary gastroenterologist Dr. Bosie Clos as an outpatient as needed  [facility]  office [phone] After 5PM or if no answer call [phone]

## 2023-10-14 NOTE — Plan of Care (Signed)
  Problem: Education: Goal: Ability to describe self-care measures that may prevent or decrease complications (Diabetes Survival Skills Education) will improve Outcome: Progressing   Problem: Coping: Goal: Ability to adjust to condition or change in health will improve Outcome: Progressing   Problem: Fluid Volume: Goal: Ability to maintain a balanced intake and output will improve Outcome: Progressing   Problem: Skin Integrity: Goal: Risk for impaired skin integrity will decrease Outcome: Progressing   Problem: Clinical Measurements: Goal: Diagnostic test results will improve Outcome: Progressing   Problem: Nutrition: Goal: Adequate nutrition will be maintained Outcome: Progressing   Problem: Safety: Goal: Ability to remain free from injury will improve Outcome: Progressing

## 2023-10-14 NOTE — Progress Notes (Signed)
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  Patient has been abdominal pain outpatient for several days therefore was referred to outpatient GI who ended up getting CT scan which showed abnormal findings and eventually referred to the hospital.   Assessment & Plan:  Principal Problem:   Perforation of sigmoid colon due to diverticulitis Active Problems:   AKI (acute kidney injury) (HCC)   Hepatic cirrhosis (HCC)   Liver mass, right lobe   Autoimmune hepatitis (HCC)   Insulin dependent type 2 diabetes mellitus (HCC)   Hypertension associated with diabetes (HCC)   Mass of right adrenal gland (HCC)   Cholelithiasis   Hyponatremia   Normocytic anemia   Hypothyroidism   Hyperlipidemia associated with type 2 diabetes mellitus (HCC)    Acute sigmoid diverticulitis with perforation: -This is seen on the CT scan.  Diet as tolerated.  Change antibiotics to Rocephin and Flagyl    Acute kidney injury: Creatinine 2.03 on admission, baseline creatinine 1.3.  Creatinine peaked 2.69.  Continue IV fluids, Cr 2.0   Right hepatic lobe mass Right adrenal gland mass: A heterogeneously enhancing 4 x 4 cm right hepatic lobe mass was seen on CT, highly suspicious for hepatoma.  MRI abdomen is concerning for HCC.  Oncology is following.  Continue fluids.  If possible will obtain CT chest with contrast tomorrow if not then we will obtain blood without contrast.  Discussed with oncology, Dr. Al Pimple -AFP-elevated - MRCP-concerning for [facility].   Oncology aware   Hepatic cirrhosis with portal venous hypertension, paraesophageal varices, ascites History of autoimmune hepatitis: Signs of decompensation.  On outpatient mercaptopurine.  Management per GI    Cholelithiasis with gallbladder wall thickening: Possibly secondary to cirrhosis.  Seen by general surgery   Hyponatremia: Improved From possible dehydration and cirrhosis.  Gentle hydration  Lactic acidosis - Improved with IV fluids   Insulin-dependent type 2 diabetes: -Sliding scale and Accu-Cheks   Hypertension: Stable Currently on hold.  IV as needed   Normocytic anemia: Stay well hemoglobin around 9   Hypothyroidism: On Synthroid   Hyperlipidemia: Holding Zetia.  DVT prophylaxis: SCDs    Code Status: Full Code Family Communication: H husband at bedside and daughter over the phone Status is: Inpatient Remains inpatient appropriate because: Hopefully home tomorrow once renal function improves    Subjective: Patient tells me she feels much better today.  Tolerating p.o. without any issues.  Examination:  General exam: Appears calm and comfortable  Respiratory system: Clear to auscultation. Respiratory effort normal. Cardiovascular system: S1 & S2 heard, RRR. No JVD, murmurs, rubs, gallops or clicks. No pedal edema. Gastrointestinal system: Abdomen is nondistended, soft and nontender. No organomegaly or masses felt. Normal bowel sounds heard. Central nervous system: Alert and oriented. No focal neurological deficits. Extremities: Symmetric 5 x 5 power. Skin: No rashes, lesions or ulcers Psychiatry: Judgement and insight appear normal. Mood & affect appropriate.                Diet Orders (From admission, onward)     Start     Ordered   10/13/23 0756  DIET SOFT Room service  appropriate? Yes; Fluid consistency: Thin  Diet effective now       Question Answer Comment  Room service appropriate? Yes   Fluid consistency: Thin      10/13/23  0755            Objective: Vitals:   10/13/23 2116 10/13/23 2300 10/14/23 0500 10/14/23 0636  BP: (!) 118/59   (!) 113/55  Pulse: 89   89  Resp: 19   19  Temp: 98.7 F (37.1 C) 98.8 F (37.1 C)  98.2 F (36.8 C)  TempSrc:  Oral    SpO2: 97%   98%  Weight:   97.1 kg   Height:        Intake/Output Summary (Last 24 hours) at 10/14/2023 0857 Last data filed at 10/14/2023 0800 Gross per 24 hour  Intake 2090.3 ml  Output --  Net 2090.3 ml   Filed Weights   10/12/23 0500 10/13/23 0503 10/14/23 0500  Weight: 91.6 kg 93.7 kg 97.1 kg    Scheduled Meds:  ALPRAZolam  0.5 mg Oral Once   insulin aspart  0-5 Units Subcutaneous QHS   insulin aspart  0-9 Units Subcutaneous TID WC   levothyroxine  150 mcg Oral Once per day on Sunday Saturday   levothyroxine  300 mcg Oral Once per day on Monday Tuesday Wednesday Thursday Friday   Continuous Infusions:  cefTRIAXone (ROCEPHIN)  IV 2 g (10/13/23 1324)   dextrose 5 % and 0.45 % NaCl     dextrose 5 % and 0.9 % NaCl 75 mL/hr at 10/14/23 0800   metronidazole Stopped (10/13/23 2225)    Nutritional status     Body mass index is 39.16 kg/m.  Data Reviewed:   CBC: Recent Labs  Lab 10/10/23 1642 10/11/23 0315 10/12/23 0309 10/13/23 0319 10/14/23 0303  WBC 16.4* 14.0* 9.4 7.5 7.6  NEUTROABS 11.8*  --   --   --   --   HGB 11.4* 9.8* 8.6* 8.4* 8.4*  HCT 31.9* 28.0* 24.7* 24.0* 24.0*  MCV 87.4 88.9 91.1 89.6 88.9  PLT 145* 123* 93* 88* 90*   Basic Metabolic Panel: Recent Labs  Lab 10/10/23 1642 10/11/23 0315 10/12/23 0309 10/13/23 0319 10/14/23 0303  NA 131* 129* 135 137 137  K 4.6 4.6 4.1 4.2 4.1  CL 105 105 108 110 113*  CO2 21* 21* 20* 19* 20*  GLUCOSE 89 80 101* 88 104*  BUN 25* 25* 30* 24* 18  CREATININE 2.03* 2.21* 2.69* 2.29* 2.00*  CALCIUM 7.6* 7.3* 7.6* 7.7* 7.6*  MG  --   --  2.0 1.9 1.9  PHOS  --   --  4.1  --   --    GFR: Estimated Creatinine Clearance: 29.7 mL/min (A) (by C-G formula based on SCr of  2 mg/dL (H)). Liver Function Tests: Recent Labs  Lab 10/10/23 1642 10/11/23 0315 10/12/23 0309 10/13/23 0319 10/14/23 0303  AST 172* 144* 140* 105* 97*  ALT 109* 93* 87* 68* 64*  ALKPHOS 112 113 72 60 57  BILITOT 3.7* 3.4* 3.3* 3.4* 3.0*  PROT 9.3* 8.2* 7.5 6.9 7.1  ALBUMIN <1.5* <1.5* 2.1* 2.6* 2.4*   Recent Labs  Lab 10/10/23 1642  LIPASE 33   Recent Labs  Lab 10/11/23 1123  AMMONIA 30   Coagulation Profile: Recent Labs  Lab 10/10/23 1642 10/11/23 0315  INR 2.1* 2.2*   Cardiac Enzymes: No results for input(s): "CKTOTAL", "CKMB", "CKMBINDEX", "TROPONINI" in the last 168 hours. BNP (last 3 results) No  results for input(s): "PROBNP" in the last 8760 hours. HbA1C: No results for input(s): "HGBA1C" in the last 72 hours. CBG: Recent Labs  Lab 10/13/23 1115 10/13/23 1606 10/13/23 2016 10/14/23 0454 10/14/23 0722  GLUCAP 153* 143* 150* 100* 127*   Lipid Profile: No results for input(s): "CHOL", "HDL", "LDLCALC", "TRIG", "CHOLHDL", "LDLDIRECT" in the last 72 hours. Thyroid Function Tests: No results for input(s): "TSH", "T4TOTAL", "FREET4", "T3FREE", "THYROIDAB" in the last 72 hours. Anemia Panel: No results for input(s): "VITAMINB12", "FOLATE", "FERRITIN", "TIBC", "IRON", "RETICCTPCT" in the last 72 hours. Sepsis Labs: Recent Labs  Lab 10/10/23 1721 10/10/23 1921  LATICACIDVEN 2.0* 2.4*    No results found for this or any previous visit (from the past 240 hours).       Radiology Studies: MR ABDOMEN W WO CONTRAST Addendum Date: 10/13/2023 ADDENDUM REPORT: 10/13/2023 09:34 Electronically Signed   By: Jearld Lesch M.D.   On: 10/13/2023 09:34   Result Date: 10/13/2023 CLINICAL DATA:  Characterize right lobe liver mass, cirrhosis, right adrenal mass EXAM: MRI ABDOMEN WITHOUT AND WITH CONTRAST TECHNIQUE: Multiplanar multisequence MR imaging of the abdomen was performed both before and after the administration of intravenous contrast. CONTRAST:  9mL GADAVIST  GADOBUTROL 1 MMOL/ML IV SOLN COMPARISON:  CT abdomen pelvis, 10/10/2023, CT abdomen pelvis, 12/03/2017 FINDINGS: Examination is generally limited by breath motion artifact throughout, particularly multiphasic contrast enhanced sequences. Lower chest: No acute abnormality. Hepatobiliary: Coarse, nodular cirrhotic morphology of the liver. Subtly hyperenhancing mass of the inferior right lobe of the liver, hepatic segment VI, with clear evidence of washout but without capsular enhancement, measuring 3.4 x 3.3 cm (series 23, image 56). Layering sludge and small gallstones (series 25, image 70). Gallbladder wall thickening and small volume pericholecystic fluid, nonspecific in the setting of ascites. No biliary ductal dilatation Pancreas: Unremarkable. No pancreatic ductal dilatation or surrounding inflammatory changes. Spleen: Normal in size without significant abnormality. Adrenals/Urinary Tract: Heterogeneously enhancing mass of the right adrenal gland measuring 4.1 x 2.3 cm. Normal left adrenal gland. Numerous bilateral renal cortical cysts, for which no further follow-up or characterization is required. Kidneys are otherwise normal, without obvious renal calculi, solid lesion, or hydronephrosis. Stomach/Bowel: Stomach is within normal limits. No evidence of bowel wall thickening, distention, or inflammatory changes. Vascular/Lymphatic: Aortic atherosclerosis. No enlarged abdominal lymph nodes. Other: No abdominal wall hernia. Anasarca. Small volume ascites throughout the abdomen. Musculoskeletal: No acute or significant osseous findings. IMPRESSION: 1. Examination is generally limited by breath motion artifact throughout, particularly multiphasic contrast enhanced sequences. 2. Within this limitation, there is a subtly hyperenhancing mass of the inferior right lobe of the liver, hepatic segment VI, with clear evidence of washout but without capsular enhancement, measuring 3.4 x 3.3 cm. This is consistent with  hepatocellular carcinoma, LI-RADS category 5. 3. Heterogeneously enhancing mass of the right adrenal gland measuring 4.1 x 2.3 cm, new compared to prior examination dated 12/05/2017 and highly concerning for malignancy. Isolated adrenal metastasis of hepatocellular carcinoma is rare but has been reported in the literature. Normal left adrenal gland. 4. Cirrhosis and small volume ascites. 5. Layering sludge and small gallstones. Gallbladder wall thickening and small volume pericholecystic fluid, nonspecific in the setting of ascites. No biliary ductal dilatation. 6. Anasarca. Aortic Atherosclerosis (ICD10-I70.0). Electronically Signed: By: Jearld Lesch M.D. On: 10/13/2023 08:15           LOS: 4 days   Time spent= 35 mins    Rona Tomson Zoila Shutter, MD [facility]  If 7PM-7AM, please contact night-coverage  10/14/2023, 8:57 AM

## 2023-10-14 NOTE — Progress Notes (Signed)
Subjective/Chief Complaint: Feels better, no further pain.  Tolerating soft diet.   Objective: Vital signs in last 24 hours: Temp:  [97.7 F (36.5 C)-98.8 F (37.1 C)] 98.2 F (36.8 C) (02/03 0636) Pulse Rate:  [76-89] 89 (02/03 0636) Resp:  [18-19] 19 (02/03 0636) BP: (113-132)/(55-73) 113/55 (02/03 0636) SpO2:  [97 %-99 %] 98 % (02/03 0636) Weight:  [97.1 kg] 97.1 kg (02/03 0500) Last BM Date : 10/13/23 (per pt report)  Intake/Output from previous day: 02/02 0701 - 02/03 0700 In: 542 [I.V.:406.3; IV Piggyback:135.7] Out: -  Intake/Output this shift: Total I/O In: 1548.3 [I.V.:1348.3; IV Piggyback:200] Out: -   Ab nondistended, nontender in LLQ today, soft  Lab Results:  Recent Labs    10/13/23 0319 10/14/23 0303  WBC 7.5 7.6  HGB 8.4* 8.4*  HCT 24.0* 24.0*  PLT 88* 90*   BMET Recent Labs    10/13/23 0319 10/14/23 0303  NA 137 137  K 4.2 4.1  CL 110 113*  CO2 19* 20*  GLUCOSE 88 104*  BUN 24* 18  CREATININE 2.29* 2.00*  CALCIUM 7.7* 7.6*   PT/INR No results for input(s): "LABPROT", "INR" in the last 72 hours.  ABG No results for input(s): "PHART", "HCO3" in the last 72 hours.  Invalid input(s): "PCO2", "PO2"  Studies/Results: MR ABDOMEN W WO CONTRAST Addendum Date: 10/13/2023 ADDENDUM REPORT: 10/13/2023 09:34 Electronically Signed   By: Jearld Lesch M.D.   On: 10/13/2023 09:34   Result Date: 10/13/2023 CLINICAL DATA:  Characterize right lobe liver mass, cirrhosis, right adrenal mass EXAM: MRI ABDOMEN WITHOUT AND WITH CONTRAST TECHNIQUE: Multiplanar multisequence MR imaging of the abdomen was performed both before and after the administration of intravenous contrast. CONTRAST:  9mL GADAVIST GADOBUTROL 1 MMOL/ML IV SOLN COMPARISON:  CT abdomen pelvis, 10/10/2023, CT abdomen pelvis, 12/03/2017 FINDINGS: Examination is generally limited by breath motion artifact throughout, particularly multiphasic contrast enhanced sequences. Lower chest: No acute  abnormality. Hepatobiliary: Coarse, nodular cirrhotic morphology of the liver. Subtly hyperenhancing mass of the inferior right lobe of the liver, hepatic segment VI, with clear evidence of washout but without capsular enhancement, measuring 3.4 x 3.3 cm (series 23, image 56). Layering sludge and small gallstones (series 25, image 70). Gallbladder wall thickening and small volume pericholecystic fluid, nonspecific in the setting of ascites. No biliary ductal dilatation Pancreas: Unremarkable. No pancreatic ductal dilatation or surrounding inflammatory changes. Spleen: Normal in size without significant abnormality. Adrenals/Urinary Tract: Heterogeneously enhancing mass of the right adrenal gland measuring 4.1 x 2.3 cm. Normal left adrenal gland. Numerous bilateral renal cortical cysts, for which no further follow-up or characterization is required. Kidneys are otherwise normal, without obvious renal calculi, solid lesion, or hydronephrosis. Stomach/Bowel: Stomach is within normal limits. No evidence of bowel wall thickening, distention, or inflammatory changes. Vascular/Lymphatic: Aortic atherosclerosis. No enlarged abdominal lymph nodes. Other: No abdominal wall hernia. Anasarca. Small volume ascites throughout the abdomen. Musculoskeletal: No acute or significant osseous findings. IMPRESSION: 1. Examination is generally limited by breath motion artifact throughout, particularly multiphasic contrast enhanced sequences. 2. Within this limitation, there is a subtly hyperenhancing mass of the inferior right lobe of the liver, hepatic segment VI, with clear evidence of washout but without capsular enhancement, measuring 3.4 x 3.3 cm. This is consistent with hepatocellular carcinoma, LI-RADS category 5. 3. Heterogeneously enhancing mass of the right adrenal gland measuring 4.1 x 2.3 cm, new compared to prior examination dated 12/05/2017 and highly concerning for malignancy. Isolated adrenal metastasis of hepatocellular  carcinoma is  rare but has been reported in the literature. Normal left adrenal gland. 4. Cirrhosis and small volume ascites. 5. Layering sludge and small gallstones. Gallbladder wall thickening and small volume pericholecystic fluid, nonspecific in the setting of ascites. No biliary ductal dilatation. 6. Anasarca. Aortic Atherosclerosis (ICD10-I70.0). Electronically Signed: By: Jearld Lesch M.D. On: 10/13/2023 08:15    Anti-infectives: Anti-infectives (From admission, onward)    Start     Dose/Rate Route Frequency Ordered Stop   10/13/23 1330  metroNIDAZOLE (FLAGYL) IVPB 500 mg        500 mg 100 mL/hr over 60 Minutes Intravenous Every 12 hours 10/13/23 1236     10/13/23 1330  cefTRIAXone (ROCEPHIN) 2 g in sodium chloride 0.9 % 100 mL IVPB        2 g 200 mL/hr over 30 Minutes Intravenous Daily 10/13/23 1236     10/11/23 0100  piperacillin-tazobactam (ZOSYN) IVPB 3.375 g  Status:  Discontinued        3.375 g 12.5 mL/hr over 240 Minutes Intravenous Every 8 hours 10/10/23 1959 10/13/23 1234   10/10/23 1700  piperacillin-tazobactam (ZOSYN) IVPB 3.375 g        3.375 g 12.5 mL/hr over 240 Minutes Intravenous  Once 10/10/23 1651 10/10/23 2113       Assessment/Plan: Sigmoid diverticulitis with microperforation   -better on abx, would treat a total of 10-14 days total -tolerating soft diet -no plan for any surgery for her here as she is very high risk given her HCC,cirrhosis etc.   -doing well surgically.  We will sign off at this time.  Please call if needed   FEN: ssoft ID: [email or web address] VTE: SCDs, held in setting of anemia, thrombocytopenia   Liver mass (HCC on MRI) with adrenal mets - oncology following History of cirrhosis Anemia/thrombocytopenia  HTN T2DM  I reviewed last 24 h vitals and pain scores, last 48 h intake and output, last 24 h labs and trends, and last 24 h imaging results.     Letha Cape 10/14/2023

## 2023-10-15 DIAGNOSIS — K5720 Diverticulitis of large intestine with perforation and abscess without bleeding: Secondary | ICD-10-CM | POA: Diagnosis not present

## 2023-10-15 LAB — CBC
HCT: 25 % — ABNORMAL LOW (ref 36.0–46.0)
Hemoglobin: 8.5 g/dL — ABNORMAL LOW (ref 12.0–15.0)
MCH: 30.8 pg (ref 26.0–34.0)
MCHC: 34 g/dL (ref 30.0–36.0)
MCV: 90.6 fL (ref 80.0–100.0)
Platelets: 82 10*3/uL — ABNORMAL LOW (ref 150–400)
RBC: 2.76 MIL/uL — ABNORMAL LOW (ref 3.87–5.11)
RDW: 16.6 % — ABNORMAL HIGH (ref 11.5–15.5)
WBC: 7.7 10*3/uL (ref 4.0–10.5)
nRBC: 0 % (ref 0.0–0.2)

## 2023-10-15 LAB — COMPREHENSIVE METABOLIC PANEL
ALT: 61 U/L — ABNORMAL HIGH (ref 0–44)
AST: 95 U/L — ABNORMAL HIGH (ref 15–41)
Albumin: 2.2 g/dL — ABNORMAL LOW (ref 3.5–5.0)
Alkaline Phosphatase: 59 U/L (ref 38–126)
Anion gap: 8 (ref 5–15)
BUN: 12 mg/dL (ref 8–23)
CO2: 19 mmol/L — ABNORMAL LOW (ref 22–32)
Calcium: 7.7 mg/dL — ABNORMAL LOW (ref 8.9–10.3)
Chloride: 111 mmol/L (ref 98–111)
Creatinine, Ser: 1.53 mg/dL — ABNORMAL HIGH (ref 0.44–1.00)
GFR, Estimated: 37 mL/min — ABNORMAL LOW (ref >60)
Glucose, Bld: 83 mg/dL (ref 70–99)
Potassium: 4.1 mmol/L (ref 3.5–5.1)
Sodium: 138 mmol/L (ref 135–145)
Total Bilirubin: 2.8 mg/dL — ABNORMAL HIGH (ref 0.0–1.2)
Total Protein: 7 g/dL (ref 6.5–8.1)

## 2023-10-15 LAB — MAGNESIUM: Magnesium: 1.8 mg/dL (ref 1.7–2.4)

## 2023-10-15 LAB — GLUCOSE, CAPILLARY
Glucose-Capillary: 97 mg/dL (ref 70–99)
Glucose-Capillary: 100 mg/dL — ABNORMAL HIGH (ref 70–99)

## 2023-10-15 MED ORDER — AMOXICILLIN-POT CLAVULANATE 875-125 MG PO TABS: 1.0000 | ORAL_TABLET | Freq: Two times a day (BID) | ORAL | 0 refills | Status: AC

## 2023-10-15 NOTE — Evaluation (Signed)
 Occupational Therapy Evaluation Patient Details Name: Dana Thornton MRN: [id number] DOB: 06-21-1956 Today's Date: 10/15/2023   History of Present Illness Patient is a 68 year old female who presented on 1/30 with abdominal pain. Patient's CT revealed right hepatic lobe mass, adrenal mass, mesenteric edema, paraesophageal varices, acute sigmoid diverticulitis with perforation, possible acute cholecystitis. PMH:HTN, DM, HLD, cirrhosis to autoimmune hepatitis.   Clinical Impression   Patient evaluated by Occupational Therapy with no further acute OT needs identified. All education has been completed and the patient has no further questions. Husband and patient verbalized they don't need any AD or further follow up with plan to have 24/7 caregiver support at home at [facility] level.  See below for any follow-up Occupational Therapy or equipment needs. OT is signing off. Thank you for this referral.        If plan is discharge home, recommend the following: A little help with bathing/dressing/bathroom;Assistance with cooking/housework;Direct supervision/assist for medications management;Help with stairs or ramp for entrance;Direct supervision/assist for financial management;Assist for transportation    Functional Status Assessment  Patient has had a recent decline in their functional status and demonstrates the ability to make significant improvements in function in a reasonable and predictable amount of time.  Equipment Recommendations          Precautions / Restrictions Restrictions Weight Bearing Restrictions Per Provider Order: No      Mobility Bed Mobility Overal bed mobility: Modified Independent             General bed mobility comments: with HOB raised              Balance Overall balance assessment: No apparent balance deficits (not formally assessed)             ADL either performed or assessed with clinical judgement   ADL Overall ADL's : Modified independent          General ADL Comments: with RW, patient was able to engage in transfers, grooming at sink, bed mobility and functional mobility with MI with no LOB. patient educated on keeping walker with her for approaching sink and ECT after d/c. husband reported family would be with her 24/7     Vision   Vision Assessment?: No apparent visual deficits            Pertinent Vitals/Pain Pain Assessment Pain Assessment: No/denies pain     Extremity/Trunk Assessment Upper Extremity Assessment Upper Extremity Assessment: Overall WFL for tasks assessed   Lower Extremity Assessment Lower Extremity Assessment: Overall WFL for tasks assessed   Cervical / Trunk Assessment Cervical / Trunk Assessment: Normal   Communication Communication Communication: No apparent difficulties   Cognition Arousal: Alert Behavior During Therapy: WFL for tasks assessed/performed Overall Cognitive Status: Within Functional Limits for tasks assessed             General Comments: husband was present during session as well.                Home Living Family/patient expects to be discharged to:: Private residence Living Arrangements: Spouse/significant other Available Help at Discharge: Family;Available 24 hours/day Type of Home: House Home Access: Level entry     Home Layout: One level     Bathroom Shower/Tub: Tub only;Walk-in shower         Home Equipment: [employer] (2 wheels)          Prior Functioning/Environment Prior Level of Function : Independent/Modified Independent  OT Problem List: Decreased activity tolerance;Decreased safety awareness      OT Treatment/Interventions: Self-care/ADL training;Patient/family education;Energy conservation    OT Goals(Current goals can be found in the care plan section) Acute Rehab OT Goals OT Goal Formulation: All assessment and education complete, DC therapy  OT Frequency:         AM-PAC OT 6  Clicks Daily Activity     Outcome Measure Help from another person eating meals?: None Help from another person taking care of personal grooming?: None Help from another person toileting, which includes using toliet, bedpan, or urinal?: A Little Help from another person bathing (including washing, rinsing, drying)?: A Little Help from another person to put on and taking off regular upper body clothing?: None Help from another person to put on and taking off regular lower body clothing?: A Little 6 Click Score: 21   End of Session Equipment Utilized During Treatment: Gait belt;Rolling walker (2 wheels) Nurse Communication: Other (comment) (OK to see)  Activity Tolerance: Patient tolerated treatment well Patient left: in chair;with call bell/phone within reach;with family/visitor present  OT Visit Diagnosis: Unsteadiness on feet (R26.81);Other abnormalities of gait and mobility (R26.89)                Time: 9170-9152 OT Time Calculation (min): 18 min Charges:  OT General Charges $OT Visit: 1 Visit OT Evaluation $OT Eval Low Complexity: 1 Low  [name] OTR/L, MS Acute Rehabilitation Department Office# [phone]   Geofm [name] 10/15/2023, 9:01 AM

## 2023-10-15 NOTE — Progress Notes (Signed)
 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  Patient has been abdominal pain outpatient for several days therefore was referred to outpatient GI who ended up getting CT scan which showed abnormal findings and eventually referred to the hospital.  During the hospitalization AFP was elevated and MRI was eventually concerning for hepatocellular carcinoma therefore oncology team was consulted.  CT chest without contrast done for staging.  Further workup deferred outpatient.  With conservative management acute diverticulitis slowly resolved along with acute kidney injury.  Today we will discharge the patient in stable condition as she is doing much better.   Assessment & Plan:  Principal Problem:   Perforation of sigmoid colon due to diverticulitis Active Problems:   AKI (acute kidney injury) (HCC)   Hepatic cirrhosis (HCC)   Liver mass, right lobe   Autoimmune hepatitis (HCC)   Insulin  dependent type 2 diabetes mellitus (HCC)   Hypertension associated with diabetes (HCC)   Mass of right adrenal gland (HCC)   Cholelithiasis   Hyponatremia   Normocytic anemia   Hypothyroidism   Hyperlipidemia associated with type 2 diabetes mellitus (HCC)    Acute sigmoid diverticulitis with perforation: -This is seen on the CT scan.  Diet as tolerated.  Change antibiotics to Rocephin  and Flagyl , will transition to p.o. Augmentin  upon discharge, 9 more days have not prescribed    Acute kidney injury: Creatinine 2.03 on admission, baseline  creatinine 1.3.  Creatinine peaked 2.69.  Resolved   Right hepatic lobe mass Right adrenal gland mass: A heterogeneously enhancing 4 x 4 cm right hepatic lobe mass was seen on CT, highly suspicious for hepatoma.  MRI abdomen is concerning for HCC.  Oncology performed CT chest without contrast which did not show any evidence of metastatic disease. -AFP-elevated - MRCP-concerning for HCC.  Oncology aware.  Outpatient follow-up   Hepatic cirrhosis with portal venous hypertension, paraesophageal varices, ascites History of autoimmune hepatitis: No signs of decompensation.  Continue outpatient mercaptopurine .  No further workup indicated per GI team during this hospitalization..   Cholelithiasis with gallbladder wall thickening: Possibly secondary to cirrhosis.  Seen by general surgery   Hyponatremia: Improved Resolved  Lactic acidosis - Improved with IV fluids   Insulin -dependent type 2 diabetes: - Resume home   Hypertension: Stable Currently on hold.  IV as needed   Normocytic anemia: Stay well hemoglobin around 9   Hypothyroidism: On Synthroid    Hyperlipidemia: Resume home with  DVT prophylaxis: SCDs    Code Status: Full Code Family Communication: H husband at bedside and daughter over the phone Status is: Inpatient Remains inpatient appropriate because: Discharge today  Subjective: Patient doing significantly well does not have any complaints.  Denies any abdominal pain.  Tolerating p.o.  She really wishes to go home today.  Examination:  Constitutional: Not in acute distress Respiratory: Clear to auscultation bilaterally Cardiovascular: Normal sinus rhythm, no rubs Abdomen: Nontender nondistended good bowel sounds Musculoskeletal: No edema noted Skin: No rashes seen Neurologic: CN 2-12 grossly intact.  And nonfocal Psychiatric: Normal judgment and insight. Alert and oriented x 3. Normal mood.  Diet Orders (From admission, onward)      Start     Ordered   10/13/23 0756  DIET SOFT Room service appropriate? Yes; Fluid consistency: Thin  Diet effective now       Question Answer Comment  Room service appropriate? Yes   Fluid consistency: Thin      10/13/23 0755            Objective: Vitals:   10/14/23 0636 10/14/23 1332 10/14/23 2149 10/15/23 0509  BP: (!) 113/55 126/63 122/64 136/77  Pulse: 89 88 89 86  Resp: 19 17 18 17   Temp: 98.2 F (36.8 C) 97.9 F (36.6 C) 98.7 F (37.1 C) 98.1 F (36.7 C)  TempSrc:   Oral Oral  SpO2: 98% 99% 97% 97%  Weight:      Height:        Intake/Output Summary (Last 24 hours) at 10/15/2023 1119 Last data filed at 10/15/2023 1031 Gross per 24 hour  Intake 1735.7 ml  Output --  Net 1735.7 ml   Filed Weights   10/12/23 0500 10/13/23 0503 10/14/23 0500  Weight: 91.6 kg 93.7 kg 97.1 kg    Scheduled Meds:  ALPRAZolam   0.5 mg Oral Once   insulin  aspart  0-5 Units Subcutaneous QHS   insulin  aspart  0-9 Units Subcutaneous TID WC   levothyroxine   150 mcg Oral Once per day on Sunday Saturday   levothyroxine   300 mcg Oral Once per day on Monday Tuesday Wednesday Thursday Friday   Continuous Infusions:  cefTRIAXone  (ROCEPHIN )  IV 2 g (10/15/23 0822)   metronidazole  500 mg (10/15/23 1000)    Nutritional status     Body mass index is 39.16 kg/m.  Data Reviewed:   CBC: Recent Labs  Lab 10/10/23 1642 10/11/23 0315 10/12/23 0309 10/13/23 0319 10/14/23 0303 10/15/23 0329  WBC 16.4* 14.0* 9.4 7.5 7.6 7.7  NEUTROABS 11.8*  --   --   --   --   --   HGB 11.4* 9.8* 8.6* 8.4* 8.4* 8.5*  HCT 31.9* 28.0* 24.7* 24.0* 24.0* 25.0*  MCV 87.4 88.9 91.1 89.6 88.9 90.6  PLT 145* 123* 93* 88* 90* 82*   Basic Metabolic Panel: Recent Labs  Lab 10/11/23 0315 10/12/23 0309 10/13/23 0319 10/14/23 0303 10/15/23 0329  NA 129* 135 137 137 138  K 4.6 4.1 4.2 4.1 4.1  CL 105 108 110 113* 111  CO2 21* 20* 19* 20* 19*  GLUCOSE 80 101* 88 104* 83  BUN 25* 30* 24* 18 12   CREATININE 2.21* 2.69* 2.29* 2.00* 1.53*  CALCIUM  7.3* 7.6* 7.7* 7.6* 7.7*  MG  --  2.0 1.9 1.9 1.8  PHOS  --  4.1  --   --   --    GFR: Estimated Creatinine Clearance: 38.8 mL/min (A) (by C-G formula based on SCr of 1.53 mg/dL (H)). Liver Function Tests: Recent Labs  Lab 10/11/23 0315 10/12/23 0309 10/13/23 0319 10/14/23 0303 10/15/23 0329  AST 144* 140* 105* 97* 95*  ALT 93* 87* 68* 64* 61*  ALKPHOS 113 72 60 57 59  BILITOT 3.4* 3.3* 3.4* 3.0* 2.8*  PROT 8.2* 7.5 6.9 7.1 7.0  ALBUMIN  <1.5* 2.1* 2.6* 2.4* 2.2*   Recent Labs  Lab 10/10/23 1642  LIPASE 33   Recent Labs  Lab 10/11/23 1123  AMMONIA 30   Coagulation Profile: Recent Labs  Lab 10/10/23 1642 10/11/23 0315  INR 2.1* 2.2*   Cardiac Enzymes: No results for input(s): CKTOTAL, CKMB, CKMBINDEX, TROPONINI in  the last 168 hours. BNP (last 3 results) No results for input(s): PROBNP in the last 8760 hours. HbA1C: No results for input(s): HGBA1C in the last 72 hours. CBG: Recent Labs  Lab 10/14/23 1137 10/14/23 1717 10/14/23 2151 10/15/23 0416 10/15/23 0738  GLUCAP 117* 140* 107* 97 100*   Lipid Profile: No results for input(s): CHOL, HDL, LDLCALC, TRIG, CHOLHDL, LDLDIRECT in the last 72 hours. Thyroid Function Tests: No results for input(s): TSH, T4TOTAL, FREET4, T3FREE, THYROIDAB in the last 72 hours. Anemia Panel: No results for input(s): VITAMINB12, FOLATE, FERRITIN, TIBC, IRON, RETICCTPCT in the last 72 hours. Sepsis Labs: Recent Labs  Lab 10/10/23 1721 10/10/23 1921  LATICACIDVEN 2.0* 2.4*    No results found for this or any previous visit (from the past 240 hours).       Radiology Studies: CT CHEST WO CONTRAST Result Date: 10/14/2023 CLINICAL DATA:  Hepatocellular carcinoma staging. EXAM: CT CHEST WITHOUT CONTRAST TECHNIQUE: Multidetector CT imaging of the chest was performed following the standard protocol without IV contrast. RADIATION  DOSE REDUCTION: This exam was performed according to the departmental dose-optimization program which includes automated exposure control, adjustment of the mA and/or kV according to patient size and/or use of iterative reconstruction technique. COMPARISON:  Abdominal MRI dated 10/13/2023. FINDINGS: Evaluation of this exam is limited in the absence of intravenous contrast as well as due to respiratory motion. Cardiovascular: There is no cardiomegaly or pericardial effusion. There is coronary vascular calcification. Mild atherosclerotic calcification of the thoracic aorta. No aneurysmal dilatation. The central pulmonary arteries are grossly unremarkable. Mediastinum/Nodes: No obvious hilar or mediastinal adenopathy. Evaluation however is limited due to factors above. The esophagus is grossly unremarkable no mediastinal fluid collection. Lungs/Pleura: There are bibasilar dependent atelectasis. No focal consolidation, pleural effusion, or pneumothorax. The central airways are patent. Upper Abdomen: Cirrhosis and ascites. Partially visualized heterogeneous mass in the right lobe of the liver is not characterized on this CT. Musculoskeletal: No acute osseous pathology. IMPRESSION: 1. No acute intrathoracic pathology. No evidence of metastatic disease in the chest. 2. Cirrhosis and ascites. Partially visualized heterogeneous mass in the right lobe of the liver. 3. Aortic Atherosclerosis (ICD10-I70.0). Electronically Signed   By: Vanetta Chou M.D.   On: 10/14/2023 14:08           LOS: 5 days   Time spent= 35 mins    [place]ess JAYSON Dare, MD [facility]  If 7PM-7AM, please contact night-coverage  10/15/2023, 11:19 AM

## 2023-10-15 NOTE — Progress Notes (Signed)
 PT Cancellation Note  Patient Details Name: BEONCA GIBB MRN: [id number] DOB: 1956-08-22   Cancelled Treatment:    Reason Eval/Treat Not Completed: PT screened, no needs identified, will sign off Per OT per patient, no PT needs.  Darice Potters PT Acute Rehabilitation Services Office [phone] Weekend [phone]  Potters Darice Norris 10/15/2023, 10:16 AM

## 2023-10-15 NOTE — Progress Notes (Signed)
 Patient discharged home, IV removed, discharge paperwork provided and explained, patient verbalized understanding.

## 2023-10-16 ENCOUNTER — Other Ambulatory Visit: Payer: Self-pay | Admitting: Hematology and Oncology

## 2023-10-16 DIAGNOSIS — C220 Liver cell carcinoma: Secondary | ICD-10-CM

## 2023-10-17 ENCOUNTER — Encounter: Payer: Self-pay | Admitting: *Deleted

## 2023-10-17 ENCOUNTER — Telehealth: Payer: Self-pay | Admitting: Hematology and Oncology

## 2023-10-17 ENCOUNTER — Other Ambulatory Visit: Payer: Self-pay | Admitting: *Deleted

## 2023-10-17 NOTE — Progress Notes (Signed)
 The proposed treatment discussed in conference is for discussion purpose only and is not a binding recommendation.  The patients have not been physically examined, or presented with their treatment options.  Therefore, final treatment plans cannot be decided.

## 2023-10-17 NOTE — Discharge Summary (Addendum)
 Physician Discharge Summary  Dana Thornton FMW:[id number] DOB: 29-Mar-1956 DOA: 10/10/2023  PCP: Vernon Velna SAUNDERS, MD  Admit date: 10/10/2023 Discharge date: 10/15/23  Admitted From: Home Disposition: Home  Recommendations for Outpatient Follow-up:  Follow up with PCP in 1-2 weeks Please obtain BMP/CBC in one week your next doctors visit.  Follow-up outpatient oncology 9 more days of p.o. 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  Patient has been abdominal pain outpatient for several days therefore was referred to outpatient GI who ended up getting CT scan which showed abnormal findings and eventually referred to the hospital.  During the hospitalization AFP was elevated and MRI was eventually concerning for hepatocellular carcinoma therefore oncology team was consulted.  CT chest without contrast done for staging.  Further workup deferred outpatient.  With conservative management acute diverticulitis slowly resolved along with acute kidney injury.  Today we will discharge the patient in stable condition as she is doing much better.   Assessment & Plan:  Principal Problem:   Perforation of sigmoid colon due to diverticulitis Active Problems:   AKI (acute kidney injury) (HCC)   Hepatic cirrhosis (HCC)   Liver mass, right lobe   Autoimmune hepatitis (HCC)   Insulin  dependent type 2 diabetes mellitus (HCC)   Hypertension associated with diabetes (HCC)   Mass of right adrenal gland (HCC)   Cholelithiasis    Hyponatremia   Normocytic anemia   Hypothyroidism   Hyperlipidemia associated with type 2 diabetes mellitus (HCC)    Acute sigmoid diverticulitis with perforation: -This is seen on the CT scan.  Diet as tolerated.  Change antibiotics to Rocephin  and Flagyl , will transition to p.o. Augmentin  upon discharge, 9 more days have not prescribed    Acute kidney injury: Creatinine 2.03 on admission, baseline creatinine 1.3.  Creatinine peaked 2.69.  Resolved   Right hepatic lobe mass Right adrenal gland mass: A heterogeneously enhancing 4 x 4 cm right hepatic lobe mass was seen on CT, highly suspicious for hepatoma.  MRI abdomen is concerning for HCC.  Oncology performed CT chest without contrast which did not show any evidence of metastatic disease. -AFP-elevated - MRCP-concerning for HCC.  Oncology aware.  Outpatient follow-up   Hepatic cirrhosis with portal venous hypertension, paraesophageal varices, ascites History of autoimmune hepatitis: No signs of decompensation.  Continue outpatient mercaptopurine .  No further workup indicated per GI team during this hospitalization..   Cholelithiasis with gallbladder wall thickening: Possibly secondary to cirrhosis.  Seen by general surgery   Hyponatremia: Improved Resolved  Lactic acidosis - Improved with IV fluids   Insulin -dependent type 2 diabetes: - Resume home   Hypertension: Stable Currently on hold.  IV as needed   Normocytic anemia: Stay well hemoglobin around 9   Hypothyroidism: On Synthroid    Hyperlipidemia: Resume home with  DVT prophylaxis: SCDs    Code Status: Full Code Family Communication: H husband at bedside and daughter over the phone Status is: Inpatient Remains inpatient appropriate because: Discharge today  Subjective: Patient doing significantly well does not have any complaints.  Denies any abdominal pain.  Tolerating p.o.  She really wishes to go home today.  Examination:  Constitutional:  Not in  acute distress Respiratory: Clear to auscultation bilaterally Cardiovascular: Normal sinus rhythm, no rubs Abdomen: Nontender nondistended good bowel sounds Musculoskeletal: No edema noted Skin: No rashes seen Neurologic: CN 2-12 grossly intact.  And nonfocal Psychiatric: Normal judgment and insight. Alert and oriented x 3. Normal mood.      Discharge Diagnoses:  Principal Problem:   Perforation of sigmoid colon due to diverticulitis Active Problems:   AKI (acute kidney injury) (HCC)   Hepatic cirrhosis (HCC)   Liver mass, right lobe   Autoimmune hepatitis (HCC)   Insulin  dependent type 2 diabetes mellitus (HCC)   Hypertension associated with diabetes (HCC)   Mass of right adrenal gland (HCC)   Cholelithiasis   Hyponatremia   Normocytic anemia   Hypothyroidism   Hyperlipidemia associated with type 2 diabetes mellitus (HCC)      Discharge Exam: Vitals:   10/14/23 2149 10/15/23 0509  BP: 122/64 136/77  Pulse: 89 86  Resp: 18 17  Temp: 98.7 F (37.1 C) 98.1 F (36.7 C)  SpO2: 97% 97%   Vitals:   10/14/23 0636 10/14/23 1332 10/14/23 2149 10/15/23 0509  BP: (!) 113/55 126/63 122/64 136/77  Pulse: 89 88 89 86  Resp: 19 17 18 17   Temp: 98.2 F (36.8 C) 97.9 F (36.6 C) 98.7 F (37.1 C) 98.1 F (36.7 C)  TempSrc:   Oral Oral  SpO2: 98% 99% 97% 97%  Weight:      Height:          Discharge Instructions   Allergies as of 10/15/2023       Reactions   Ace Inhibitors Cough   Codeine Nausea And Vomiting, Other (See Comments)   GI upset   Statins Other (See Comments)   contraindicated due to liver disease        Medication List     TAKE these medications    acetaminophen  325 MG tablet Commonly known as: TYLENOL  Take 2 tablets (650 mg total) by mouth every 6 (six) hours as needed for mild pain, moderate pain, fever or headache.   albuterol  108 (90 Base) MCG/ACT inhaler Commonly known as: VENTOLIN  HFA Inhale 2 puffs into the lungs every 4 (four)  hours as needed for wheezing or shortness of breath.   Aleve 220 MG tablet Generic drug: naproxen sodium Take 220-440 mg by mouth 2 (two) times daily as needed (for pain or headaches).   amLODipine  10 MG tablet Commonly known as: NORVASC  Take 1 tablet (10 mg total) by mouth daily.   amoxicillin -clavulanate 875-125 MG tablet Commonly known as: AUGMENTIN  Take 1 tablet by mouth 2 (two) times daily for 9 days.   BreatheRite Coll Spacer Adult Misc 1 Device by Does not apply route every 6 (six) hours as needed.   carvedilol  25 MG tablet Commonly known as: COREG  Take 25 mg by mouth 2 (two) times daily with a meal.   ezetimibe  10 MG tablet Commonly known as: ZETIA  Take 10 mg by mouth daily.   furosemide 40 MG tablet Commonly known as: LASIX Take 40 mg by mouth daily as needed for fluid or edema.   HumaLOG KwikPen 100 UNIT/ML KwikPen Generic drug: insulin  lispro Inject 10 Units into the skin 3 (three) times daily with meals as needed (AS DIRECTED FOR AN ELEVATED BGL).   insulin  aspart 100 UNIT/ML injection Commonly known as: novoLOG  Before each meal 3 times a day, 120-150 - 2 units, 151-200 - 4 units, 201-250 - 8 units, 251-300 - 10 units,  301  or above 10 units and call your MD   insulin  glargine 100 UNIT/ML injection Commonly known as: LANTUS  30  Units sq Qhs and 12 units q am What changed:  how much to take how to take this when to take this reasons to take this additional instructions   lactobacillus acidophilus & bulgar chewable tablet Chew 1 tablet by mouth 3 (three) times daily with meals.   levothyroxine  300 MCG tablet Commonly known as: SYNTHROID  Take 150-300 mcg by mouth See admin instructions. Take 150 mcg by mouth 30 minutes before breakfast on Sunday and Saturday and 300 mcg on Mon/Tues/Wed/Thurs/Fri   losartan 100 MG tablet Commonly known as: COZAAR Take 100 mg by mouth daily.   mercaptopurine  50 MG tablet Commonly known as: PURINETHOL  Take 50 mg by  mouth daily. Give on an empty stomach 1 hour before or 2 hours after meals. Caution: Chemotherapy.   ondansetron  4 MG tablet Commonly known as: ZOFRAN  Take 4 mg by mouth every 6 (six) hours as needed for nausea or vomiting.   predniSONE  10 MG tablet Commonly known as: DELTASONE  Take 2 tablets (20 mg) daily with food for 4 days, then 1 tablet (10 mg) daily with food until seen by your GI doctor What changed:  how much to take how to take this when to take this additional instructions   spironolactone 25 MG tablet Commonly known as: ALDACTONE Take 25 mg by mouth daily as needed (AS DIRECTED).   triamcinolone cream 0.1 % Commonly known as: KENALOG Apply 1 Application topically 2 (two) times daily as needed (for irritation, as directed- affected area).   Trulicity 0.75 MG/0.5ML Soaj Generic drug: Dulaglutide Inject 0.75 mg into the skin every Sunday.   Vitamin D  (Ergocalciferol ) 1.25 MG (50000 UNIT) Caps capsule Commonly known as: DRISDOL  Take 50,000 Units by mouth every Sunday.        Follow-up Information     Dianna Specking, MD. Call in 3 day(s).   Specialty: Gastroenterology Why: Call office to schedule follow up appointment after hospitalization. Contact information: [address] [place] [address] [phone]         Vernon Velna SAUNDERS, MD Follow up in 1 week(s).   Specialty: Internal Medicine Contact information: [address] [place] [address] [phone]         Iruku, Praveena, MD Follow up in 1 week(s).   Specialty: Hematology and Oncology Contact information: [address] [place] [address] [phone]                Allergies  Allergen Reactions   Ace Inhibitors Cough   Codeine Nausea And Vomiting and Other (See Comments)    GI upset   Statins Other (See Comments)    contraindicated due to liver disease    You were cared for by a hospitalist during your hospital stay. If you have  any questions about your discharge medications or the care you received while you were in the hospital after you are discharged, you can call the unit and asked to speak with the hospitalist on call if the hospitalist that took care of you is not available. Once you are discharged, your primary care physician will handle any further medical issues. Please note that no refills for any discharge medications will be authorized once you are discharged, as it is imperative that you return to your primary care physician (or establish a relationship with a primary care physician if you do not have  one) for your aftercare needs so that they can reassess your need for medications and monitor your lab values.  You were cared for by a hospitalist during your hospital stay. If you have any questions about your discharge medications or the care you received while you were in the hospital after you are discharged, you can call the unit and asked to speak with the hospitalist on call if the hospitalist that took care of you is not available. Once you are discharged, your primary care physician will handle any further medical issues. Please note that NO REFILLS for any discharge medications will be authorized once you are discharged, as it is imperative that you return to your primary care physician (or establish a relationship with a primary care physician if you do not have one) for your aftercare needs so that they can reassess your need for medications and monitor your lab values.  Please request your Prim.MD to go over all Hospital Tests and Procedure/Radiological results at the follow up, please get all Hospital records sent to your Prim MD by signing hospital release before you go home.  Get CBC, CMP, 2 view Chest X ray checked  by Primary MD during your next visit or SNF MD in 5-7 days ( we routinely change or add medications that can affect your baseline labs and fluid status, therefore we recommend that you get the  mentioned basic workup next visit with your PCP, your PCP may decide not to get them or add new tests based on their clinical decision)  On your next visit with your primary care physician please Get Medicines reviewed and adjusted.  If you experience worsening of your admission symptoms, develop shortness of breath, life threatening emergency, suicidal or homicidal thoughts you must seek medical attention immediately by calling 911 or calling your MD immediately  if symptoms less severe.  You Must read complete instructions/literature along with all the possible adverse reactions/side effects for all the Medicines you take and that have been prescribed to you. Take any new Medicines after you have completely understood and accpet all the possible adverse reactions/side effects.   Do not drive, operate heavy machinery, perform activities at heights, swimming or participation in water  activities or provide baby sitting services if your were admitted for syncope or siezures until you have seen by Primary MD or a Neurologist and advised to do so again.  Do not drive when taking Pain medications.   Procedures/Studies: CT CHEST WO CONTRAST Result Date: 10/14/2023 CLINICAL DATA:  Hepatocellular carcinoma staging. EXAM: CT CHEST WITHOUT CONTRAST TECHNIQUE: Multidetector CT imaging of the chest was performed following the standard protocol without IV contrast. RADIATION DOSE REDUCTION: This exam was performed according to the departmental dose-optimization program which includes automated exposure control, adjustment of the mA and/or kV according to patient size and/or use of iterative reconstruction technique. COMPARISON:  Abdominal MRI dated 10/13/2023. FINDINGS: Evaluation of this exam is limited in the absence of intravenous contrast as well as due to respiratory motion. Cardiovascular: There is no cardiomegaly or pericardial effusion. There is coronary vascular calcification. Mild atherosclerotic  calcification of the thoracic aorta. No aneurysmal dilatation. The central pulmonary arteries are grossly unremarkable. Mediastinum/Nodes: No obvious hilar or mediastinal adenopathy. Evaluation however is limited due to factors above. The esophagus is grossly unremarkable no mediastinal fluid collection. Lungs/Pleura: There are bibasilar dependent atelectasis. No focal consolidation, pleural effusion, or pneumothorax. The central airways are patent. Upper Abdomen: Cirrhosis and ascites. Partially visualized heterogeneous mass in the right  lobe of the liver is not characterized on this CT. Musculoskeletal: No acute osseous pathology. IMPRESSION: 1. No acute intrathoracic pathology. No evidence of metastatic disease in the chest. 2. Cirrhosis and ascites. Partially visualized heterogeneous mass in the right lobe of the liver. 3. Aortic Atherosclerosis (ICD10-I70.0). Electronically Signed   By: Vanetta Chou M.D.   On: 10/14/2023 14:08   MR ABDOMEN W WO CONTRAST Addendum Date: 10/13/2023 ADDENDUM REPORT: 10/13/2023 09:34 Electronically Signed   By: Marolyn JONETTA Jaksch M.D.   On: 10/13/2023 09:34   Result Date: 10/13/2023 CLINICAL DATA:  Characterize right lobe liver mass, cirrhosis, right adrenal mass EXAM: MRI ABDOMEN WITHOUT AND WITH CONTRAST TECHNIQUE: Multiplanar multisequence MR imaging of the abdomen was performed both before and after the administration of intravenous contrast. CONTRAST:  9mL GADAVIST  GADOBUTROL  1 MMOL/ML IV SOLN COMPARISON:  CT abdomen pelvis, 10/10/2023, CT abdomen pelvis, 12/03/2017 FINDINGS: Examination is generally limited by breath motion artifact throughout, particularly multiphasic contrast enhanced sequences. Lower chest: No acute abnormality. Hepatobiliary: Coarse, nodular cirrhotic morphology of the liver. Subtly hyperenhancing mass of the inferior right lobe of the liver, hepatic segment VI, with clear evidence of washout but without capsular enhancement, measuring 3.4 x 3.3 cm  (series 23, image 56). Layering sludge and small gallstones (series 25, image 70). Gallbladder wall thickening and small volume pericholecystic fluid, nonspecific in the setting of ascites. No biliary ductal dilatation Pancreas: Unremarkable. No pancreatic ductal dilatation or surrounding inflammatory changes. Spleen: Normal in size without significant abnormality. Adrenals/Urinary Tract: Heterogeneously enhancing mass of the right adrenal gland measuring 4.1 x 2.3 cm. Normal left adrenal gland. Numerous bilateral renal cortical cysts, for which no further follow-up or characterization is required. Kidneys are otherwise normal, without obvious renal calculi, solid lesion, or hydronephrosis. Stomach/Bowel: Stomach is within normal limits. No evidence of bowel wall thickening, distention, or inflammatory changes. Vascular/Lymphatic: Aortic atherosclerosis. No enlarged abdominal lymph nodes. Other: No abdominal wall hernia. Anasarca. Small volume ascites throughout the abdomen. Musculoskeletal: No acute or significant osseous findings. IMPRESSION: 1. Examination is generally limited by breath motion artifact throughout, particularly multiphasic contrast enhanced sequences. 2. Within this limitation, there is a subtly hyperenhancing mass of the inferior right lobe of the liver, hepatic segment VI, with clear evidence of washout but without capsular enhancement, measuring 3.4 x 3.3 cm. This is consistent with hepatocellular carcinoma, LI-RADS category 5. 3. Heterogeneously enhancing mass of the right adrenal gland measuring 4.1 x 2.3 cm, new compared to prior examination dated 12/05/2017 and highly concerning for malignancy. Isolated adrenal metastasis of hepatocellular carcinoma is rare but has been reported in the literature. Normal left adrenal gland. 4. Cirrhosis and small volume ascites. 5. Layering sludge and small gallstones. Gallbladder wall thickening and small volume pericholecystic fluid, nonspecific in the  setting of ascites. No biliary ductal dilatation. 6. Anasarca. Aortic Atherosclerosis (ICD10-I70.0). Electronically Signed: By: Marolyn JONETTA Jaksch M.D. On: 10/13/2023 08:15   CT ABDOMEN PELVIS W CONTRAST Result Date: 10/10/2023 CLINICAL DATA:  Generalized abdominal pain EXAM: CT ABDOMEN AND PELVIS WITH CONTRAST TECHNIQUE: Multidetector CT imaging of the abdomen and pelvis was performed using the standard protocol following bolus administration of intravenous contrast. RADIATION DOSE REDUCTION: This exam was performed according to the departmental dose-optimization program which includes automated exposure control, adjustment of the mA and/or kV according to patient size and/or use of iterative reconstruction technique. CONTRAST:  60mL OMNIPAQUE  IOHEXOL  350 MG/ML SOLN COMPARISON:  CT scan from 2019 and abdominal ultrasound 09/20/2023 FINDINGS: Lower chest: The lung bases are clear  of acute process. No pleural effusion or pulmonary lesions. The heart is normal in size. No pericardial effusion. Stable aortic and coronary artery calcifications. The distal esophagus and aorta are unremarkable. Hepatobiliary: Progressive cirrhotic changes involving the liver. Associated portal venous hypertension, portal venous collaterals, paraesophageal varices, mesenteric edema and small volume ascites. There is a heterogeneously enhancing 4 x 4 cm right hepatic lobe mass in segment 6. Findings highly suspicious for hepatoma. MRI abdomen without and with contrast is recommended for more definitive evaluation. No intrahepatic biliary dilatation. The portal and hepatic veins are patent. Calcified gallstones are noted in the gallbladder. The gallbladder wall is markedly thickened. This could reflect acute cholecystitis or changes related to cirrhosis. Right upper quadrant ultrasound may be helpful for further evaluation. No common bile duct dilatation. Pancreas: Unremarkable. No pancreatic ductal dilatation or surrounding inflammatory  changes. Spleen: Normal size.  No focal lesions.  The splenic vein is patent. Adrenals/Urinary Tract: Heterogeneously enhancing right adrenal gland mass measures 3.8 x 2.2 cm. The left adrenal gland is normal. Numerous bilateral nonenhancing renal cysts not requiring any further imaging evaluation or follow-up. Poor excretion of contrast through the kidneys is noted on the delayed images. Recommend correlation with renal function. The bladder is grossly normal. Stomach/Bowel: The stomach, duodenum and small are grossly normal without contrast. No inflammatory changes, mass lesions or obstructive findings. Right colonic wall thickening may be due to adjacent ascites and the patient's cirrhosis. Colonic diverticulosis with focal area acute perforated diverticulitis involving the sigmoid colon. Multiple small locules of gas and significant surrounding inflammatory changes. No discrete drainable abscess. Vascular/Lymphatic: Age advanced atherosclerotic calcification involving the aorta, iliac arteries and branch vessels but no aneurysm. The major venous structures are patent. Scattered upper abdominal lymph nodes typical with cirrhosis. No retroperitoneal mass or adenopathy. No pelvic adenopathy. Reproductive: The uterus and ovaries are unremarkable. Other: Mesenteric edema, small volume abdominal/pelvic ascites and diffuse body wall edema. Musculoskeletal: No significant findings. Chronic changes of bilateral hip AVN. IMPRESSION: 1. Progressive cirrhotic changes involving the liver with associated portal venous hypertension, portal venous collaterals, paraesophageal varices, mesenteric edema and small volume ascites. 2. Heterogeneously enhancing 4 x 4 cm right hepatic lobe mass in segment 6. Findings highly suspicious for hepatoma. MRI abdomen without and with contrast is recommended for more definitive evaluation. 3. Heterogeneously enhancing right adrenal gland mass measures 3.8 x 2.2 cm. Findings are concerning for  metastatic disease. 4. Cholelithiasis with marked gallbladder wall thickening. This could reflect acute cholecystitis or changes related to cirrhosis. Right upper quadrant ultrasound may be helpful for further evaluation. 5. Colonic diverticulosis with focal area of acute perforated diverticulitis involving the sigmoid colon. Multiple small locules of gas and significant surrounding inflammatory changes. No discrete drainable abscess. 6. Poor excretion of contrast through the kidneys on the delayed images. Recommend correlation with renal function. 7. Age advanced atherosclerotic calcification involving the aorta, iliac arteries and branch vessels. 8. Aortic atherosclerosis. These results will be called to the ordering clinician or representative by the Radiologist Assistant, and communication documented in the PACS or [employer]. Electronically Signed   By: MYRTIS Stammer M.D.   On: 10/10/2023 09:24   US  Abdomen Limited Result Date: 09/20/2023 CLINICAL DATA:  Evaluate for ascites EXAM: LIMITED ABDOMEN ULTRASOUND FOR ASCITES TECHNIQUE: Limited ultrasound survey for ascites was performed in all four abdominal quadrants. COMPARISON:  CT abdomen pelvis 12/03/2017 FINDINGS: Small volume ascites within all 4 quadrants. Moderate within the right lower and left lower quadrants. IMPRESSION: Small volume ascites.  Electronically Signed   By: Bard Moats M.D.   On: 09/20/2023 13:03     The results of significant diagnostics from this hospitalization (including imaging, microbiology, ancillary and laboratory) are listed below for reference.     Microbiology: No results found for this or any previous visit (from the past 240 hours).   Labs: BNP (last 3 results) No results for input(s): BNP in the last 8760 hours. Basic Metabolic Panel: Recent Labs  Lab 10/11/23 0315 10/12/23 0309 10/13/23 0319 10/14/23 0303 10/15/23 0329  NA 129* 135 137 137 138  K 4.6 4.1 4.2 4.1 4.1  CL 105 108 110 113* 111   CO2 21* 20* 19* 20* 19*  GLUCOSE 80 101* 88 104* 83  BUN 25* 30* 24* 18 12  CREATININE 2.21* 2.69* 2.29* 2.00* 1.53*  CALCIUM  7.3* 7.6* 7.7* 7.6* 7.7*  MG  --  2.0 1.9 1.9 1.8  PHOS  --  4.1  --   --   --    Liver Function Tests: Recent Labs  Lab 10/11/23 0315 10/12/23 0309 10/13/23 0319 10/14/23 0303 10/15/23 0329  AST 144* 140* 105* 97* 95*  ALT 93* 87* 68* 64* 61*  ALKPHOS 113 72 60 57 59  BILITOT 3.4* 3.3* 3.4* 3.0* 2.8*  PROT 8.2* 7.5 6.9 7.1 7.0  ALBUMIN  <1.5* 2.1* 2.6* 2.4* 2.2*   Recent Labs  Lab 10/10/23 1642  LIPASE 33   Recent Labs  Lab 10/11/23 1123  AMMONIA 30   CBC: Recent Labs  Lab 10/10/23 1642 10/11/23 0315 10/12/23 0309 10/13/23 0319 10/14/23 0303 10/15/23 0329  WBC 16.4* 14.0* 9.4 7.5 7.6 7.7  NEUTROABS 11.8*  --   --   --   --   --   HGB 11.4* 9.8* 8.6* 8.4* 8.4* 8.5*  HCT 31.9* 28.0* 24.7* 24.0* 24.0* 25.0*  MCV 87.4 88.9 91.1 89.6 88.9 90.6  PLT 145* 123* 93* 88* 90* 82*   Cardiac Enzymes: No results for input(s): CKTOTAL, CKMB, CKMBINDEX, TROPONINI in the last 168 hours. BNP: Invalid input(s): POCBNP CBG: Recent Labs  Lab 10/14/23 1137 10/14/23 1717 10/14/23 2151 10/15/23 0416 10/15/23 0738  GLUCAP 117* 140* 107* 97 100*   D-Dimer No results for input(s): DDIMER in the last 72 hours. Hgb A1c No results for input(s): HGBA1C in the last 72 hours. Lipid Profile No results for input(s): CHOL, HDL, LDLCALC, TRIG, CHOLHDL, LDLDIRECT in the last 72 hours. Thyroid function studies No results for input(s): TSH, T4TOTAL, T3FREE, THYROIDAB in the last 72 hours.  Invalid input(s): FREET3 Anemia work up No results for input(s): VITAMINB12, FOLATE, FERRITIN, TIBC, IRON, RETICCTPCT in the last 72 hours. Urinalysis    Component Value Date/Time   COLORURINE AMBER (A) 10/10/2023 1649   APPEARANCEUR HAZY (A) 10/10/2023 1649   LABSPEC 1.045 (H) 10/10/2023 1649   PHURINE 5.0 10/10/2023  1649   GLUCOSEU NEGATIVE 10/10/2023 1649   HGBUR NEGATIVE 10/10/2023 1649   BILIRUBINUR SMALL (A) 10/10/2023 1649   KETONESUR NEGATIVE 10/10/2023 1649   PROTEINUR NEGATIVE 10/10/2023 1649   UROBILINOGEN 1.0 04/09/2015 2040   NITRITE NEGATIVE 10/10/2023 1649   LEUKOCYTESUR NEGATIVE 10/10/2023 1649   Sepsis Labs Recent Labs  Lab 10/12/23 0309 10/13/23 0319 10/14/23 0303 10/15/23 0329  WBC 9.4 7.5 7.6 7.7   Microbiology No results found for this or any previous visit (from the past 240 hours).   Time coordinating discharge:  I have spent 35 minutes face to face with the patient and on the ward discussing the  patients care, assessment, plan and disposition with other care givers. >50% of the time was devoted counseling the patient about the risks and benefits of treatment/Discharge disposition and coordinating care.   SIGNED:   Burgess JAYSON Dare, MD  [facility] 10/17/2023, 9:32 AM   If 7PM-7AM, please contact night-coverage

## 2023-10-17 NOTE — Progress Notes (Signed)
 PATIENT NAVIGATOR PROGRESS NOTE  Name: Dana Thornton Date: 10/17/2023 MRN: [id number]  DOB: 11-12-1955   Reason for visit:  New patient appt  Comments:  Called and spoke with Ms Atkin and have scheduled her with Dr Autumn on 10/22/23 at 1 pm.  Directions to building and parking as well as one support person allowed in the appt. She verbalized understanding and contact number to call with any quesitons    Time spent counseling/coordinating care: 30-45 minutes

## 2023-10-17 NOTE — Telephone Encounter (Signed)
 Left patient a vm regarding upcoming appointment

## 2023-10-18 ENCOUNTER — Other Ambulatory Visit: Payer: Self-pay | Admitting: *Deleted

## 2023-10-18 ENCOUNTER — Encounter: Payer: Self-pay | Admitting: *Deleted

## 2023-10-18 ENCOUNTER — Telehealth: Payer: Self-pay | Admitting: *Deleted

## 2023-10-18 DIAGNOSIS — C220 Liver cell carcinoma: Secondary | ICD-10-CM

## 2023-10-18 NOTE — Telephone Encounter (Signed)
 Patient PET scan approved and scheduled for 2/14 Friday with arrival time of 0645.  Have written instructions to review with patient during visit with Dr Randye Buttner on 2/11

## 2023-10-18 NOTE — Progress Notes (Signed)
PET scan ordered 

## 2023-10-21 ENCOUNTER — Other Ambulatory Visit: Payer: Self-pay | Admitting: Internal Medicine

## 2023-10-21 DIAGNOSIS — N6042 Mammary duct ectasia of left breast: Secondary | ICD-10-CM

## 2023-10-22 ENCOUNTER — Inpatient Hospital Stay: Payer: Medicare Other | Admitting: Oncology

## 2023-10-22 ENCOUNTER — Telehealth: Payer: Self-pay

## 2023-10-22 NOTE — Progress Notes (Deleted)
 [facility]  ONCOLOGY CONSULT NOTE   PATIENT NAME: Dana Thornton   MR#: [id number] DOB: Jun 14, 1956  DATE OF SERVICE: 10/22/2023   REFERRING PHYSICIAN  ***  Patient Care Team: Ollen Bowl, MD as PCP - General (Internal Medicine)    CHIEF COMPLAINT/ PURPOSE OF CONSULTATION:   ***  ASSESSMENT & PLAN:   QUITA MCGRORY is a 68 y.o. female with a past medical history of ***, was referred to our clinic for ***  No problem-specific Assessment & Plan notes found for this encounter.    I reviewed lab results and outside records for this visit and discussed relevant results with the patient. Diagnosis, plan of care and treatment options were also discussed in detail with the patient. Opportunity provided to ask questions and answers provided to her apparent satisfaction. Provided instructions to call our clinic with any problems, questions or concerns prior to return visit. I recommended to continue follow-up with PCP and sub-specialists. She verbalized understanding and agreed with the plan. No barriers to learning was detected.  NCCN guidelines have been consulted in the planning of this patient's care.  [name], MD  10/22/2023 1:02 PM  [facility] - [facility] DEPT [facility] [address] [place] [address] Dept: [phone] Dept Fax: [phone]   HISTORY OF PRESENTING ILLNESS:   Discussed the use of AI scribe software for clinical note transcription with the patient, who gave verbal consent to proceed.   I have reviewed her chart and materials related to her cancer extensively and collaborated history with the patient. Summary of oncologic history is as follows:  ONCOLOGY HISTORY:  She presented to the ED due to abdominal pain and abnormal findings on the CT scan that was ordered by her primary physician.  CT of abdomen pelvis done 10/10/2023 showed right hepatic lobe  mass highly suspicious for hepatoma with right adrenal gland mass concerning for metastatic disease.  Therefore oncology consult was requested. Patient is seen today awake and somewhat alert although somnolent during examination.  Patient's husband is at bedside and was principal historian.  He states about 1 week ago patient began to complain of lower abdominal pain which radiated to her back.  She went to see her PCP who ordered a CT scan and this is why they are here. Medical history includes hypertension, diabetes, history of gallstones and cirrhosis for which she sees her primary GI Dr. Bosie Clos.  Patient has underlying history of cirrhosis however never has been decompensated and did not require paracentesis, variceal ligation or had history of encephalopathy.  She follows up with Dr. Bosie Clos in gastroenterology.   After MRI, I would recommend presenting her in the tumor board.  If the adrenal metastasis is excluded since it is a solitary tumor, we can refer her to transplant surgery to determine transplant eligibility.  If she is not a transplant candidate, we can consider local therapies.  I have clearly discussed with the family that this happens to be an incidental finding and is not immediately life-threatening.  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  At this time she does not need any inpatient intervention except the MRI abdomen and CT chest for completion of staging.  Her case was presented at GI tumor conference  on 10/16/23 where the recommendation was that she was not a candidate for resection but to get a PET scan and then possible referral for liver directed therapy (Y-90).   Oncology History   No history exists.    INTERVAL HISTORY:  ***  MEDICAL HISTORY:  Past Medical History:  Diagnosis Date    Hypercholesterolemia    Hypertension    Kidney stones    Type II diabetes mellitus (HCC)     SURGICAL HISTORY: Past Surgical History:  Procedure Laterality Date   BREAST EXCISIONAL BIOPSY Left    CESAREAN SECTION  1999   DILATION AND CURETTAGE OF UTERUS     "I lost a baby"   IR RADIOLOGIST EVAL & MGMT  11/21/2017   IR RADIOLOGIST EVAL & MGMT  12/03/2017   TUBAL LIGATION  1999    SOCIAL HISTORY: Social History   Socioeconomic History   Marital status: Married    Spouse name: Not on file   Number of children: Not on file   Years of education: Not on file   Highest education level: Not on file  Occupational History   Not on file  Tobacco Use   Smoking status: Never   Smokeless tobacco: Never  Substance and Sexual Activity   Alcohol use: No   Drug use: No   Sexual activity: Yes    Partners: Male  Other Topics Concern   Not on file  Social History Narrative   Not on file   Social Drivers of Health   Financial Resource Strain: Not on file  Food Insecurity: No Food Insecurity (10/10/2023)   Hunger Vital Sign    Worried About Running Out of Food in the Last Year: Never true    Ran Out of Food in the Last Year: Never true  Transportation Needs: No Transportation Needs (10/10/2023)   PRAPARE - Administrator, Civil Service (Medical): No    Lack of Transportation (Non-Medical): No  Physical Activity: Not on file  Stress: Not on file  Social Connections: Socially Integrated (10/10/2023)   Social Connection and Isolation Panel [NHANES]    Frequency of Communication with Friends and Family: More than three times a week    Frequency of Social Gatherings with Friends and Family: Three times a week    Attends Religious Services: More than 4 times per year    Active Member of Clubs or Organizations: Yes    Attends Banker Meetings: More than 4 times per year    Marital Status: Married  Catering manager Violence: Not At Risk (10/10/2023)   Humiliation,  Afraid, Rape, and Kick questionnaire    Fear of Current or Ex-Partner: No    Emotionally Abused: No    Physically Abused: No    Sexually Abused: No    FAMILY HISTORY: Family History  Problem Relation Age of Onset   Kidney failure Father    Hypertension Father     ALLERGIES:  She is allergic to ace inhibitors, codeine, and statins.  MEDICATIONS:  Current Outpatient Medications  Medication Sig Dispense Refill   acetaminophen (TYLENOL) 325 MG tablet Take 2 tablets (650 mg total) by mouth every 6 (six) hours as needed for mild pain, moderate pain, fever or headache. (Patient not taking: Reported on 10/10/2023) 100 tablet 0   albuterol (PROVENTIL HFA;VENTOLIN HFA) 108 (90 Base) MCG/ACT inhaler Inhale 2 puffs into the lungs every 4 (four) hours as needed for wheezing or shortness of breath. (Patient not taking: Reported on 10/10/2023) 1  Inhaler 2   ALEVE 220 MG tablet Take 220-440 mg by mouth 2 (two) times daily as needed (for pain or headaches).     amLODipine (NORVASC) 10 MG tablet Take 1 tablet (10 mg total) by mouth daily. 30 tablet 1   amoxicillin-clavulanate (AUGMENTIN) 875-125 MG tablet Take 1 tablet by mouth 2 (two) times daily for 9 days. 18 tablet 0   carvedilol (COREG) 25 MG tablet Take 25 mg by mouth 2 (two) times daily with a meal.     ezetimibe (ZETIA) 10 MG tablet Take 10 mg by mouth daily.     furosemide (LASIX) 40 MG tablet Take 40 mg by mouth daily as needed for fluid or edema.     insulin aspart (NOVOLOG) 100 UNIT/ML injection Before each meal 3 times a day, 120-150 - 2 units, 151-200 - 4 units, 201-250 - 8 units, 251-300 - 10 units,  301 or above 10 units and call your MD (Patient not taking: Reported on 10/10/2023) 1 vial 1   insulin glargine (LANTUS) 100 UNIT/ML injection 30  Units sq Qhs and 12 units q am (Patient taking differently: Inject 10 Units into the skin at bedtime as needed (for elevated BGL).) 10 mL 2   insulin lispro (HUMALOG KWIKPEN) 100 UNIT/ML KwikPen Inject  10 Units into the skin 3 (three) times daily with meals as needed (AS DIRECTED FOR AN ELEVATED BGL).     lactobacillus acidophilus & bulgar (LACTINEX) chewable tablet Chew 1 tablet by mouth 3 (three) times daily with meals. (Patient not taking: Reported on 10/10/2023) 30 tablet 1   levothyroxine (SYNTHROID) 300 MCG tablet Take 150-300 mcg by mouth See admin instructions. Take 150 mcg by mouth 30 minutes before breakfast on Sunday and Saturday and 300 mcg on Mon/Tues/Wed/Thurs/Fri     losartan (COZAAR) 100 MG tablet Take 100 mg by mouth daily.     mercaptopurine (PURINETHOL) 50 MG tablet Take 50 mg by mouth daily. Give on an empty stomach 1 hour before or 2 hours after meals. Caution: Chemotherapy.     ondansetron (ZOFRAN) 4 MG tablet Take 4 mg by mouth every 6 (six) hours as needed for nausea or vomiting.     predniSONE (DELTASONE) 10 MG tablet Take 2 tablets (20 mg) daily with food for 4 days, then 1 tablet (10 mg) daily with food until seen by your GI doctor (Patient taking differently: Take 10 mg by mouth daily with breakfast.) 30 tablet 1   Spacer/Aero-Holding Chambers (BREATHERITE COLL SPACER ADULT) MISC 1 Device by Does not apply route every 6 (six) hours as needed. 1 each 0   spironolactone (ALDACTONE) 25 MG tablet Take 25 mg by mouth daily as needed (AS DIRECTED).     triamcinolone cream (KENALOG) 0.1 % Apply 1 Application topically 2 (two) times daily as needed (for irritation, as directed- affected area).     TRULICITY 0.75 MG/0.5ML SOAJ Inject 0.75 mg into the skin every Sunday.     Vitamin D, Ergocalciferol, (DRISDOL) 50000 units CAPS capsule Take 50,000 Units by mouth every Sunday.     No current facility-administered medications for this visit.    REVIEW OF SYSTEMS:    Review of Systems - Oncology  All other pertinent systems were reviewed with the patient and are negative.  PHYSICAL EXAMINATION:  ***   There were no vitals filed for this visit. There were no vitals filed for  this visit.  Physical Exam  ***  LABORATORY DATA:   I have reviewed the data  as listed.  No results found for any visits on 10/22/23.  Lab Results  Component Value Date   WBC 7.7 10/15/2023   HGB 8.5 (L) 10/15/2023   HCT 25.0 (L) 10/15/2023   MCV 90.6 10/15/2023   PLT 82 (L) 10/15/2023   Recent Labs    10/13/23 0319 10/14/23 0303 10/15/23 0329  NA 137 137 138  K 4.2 4.1 4.1  CL 110 113* 111  CO2 19* 20* 19*  GLUCOSE 88 104* 83  BUN 24* 18 12  CREATININE 2.29* 2.00* 1.53*  CALCIUM 7.7* 7.6* 7.7*  GFRNONAA 23* 27* 37*  PROT 6.9 7.1 7.0  ALBUMIN 2.6* 2.4* 2.2*  AST 105* 97* 95*  ALT 68* 64* 61*  ALKPHOS 60 57 59  BILITOT 3.4* 3.0* 2.8*    No results found for this or any previous visit (from the past 72 hours).     RADIOGRAPHIC STUDIES:  I have personally reviewed the radiological images as listed and agree with the findings in the report.  CT CHEST WO CONTRAST Result Date: 10/14/2023 CLINICAL DATA:  Hepatocellular carcinoma staging. EXAM: CT CHEST WITHOUT CONTRAST TECHNIQUE: Multidetector CT imaging of the chest was performed following the standard protocol without IV contrast. RADIATION DOSE REDUCTION: This exam was performed according to the departmental dose-optimization program which includes automated exposure control, adjustment of the mA and/or kV according to patient size and/or use of iterative reconstruction technique. COMPARISON:  Abdominal MRI dated 10/13/2023. FINDINGS: Evaluation of this exam is limited in the absence of intravenous contrast as well as due to respiratory motion. Cardiovascular: There is no cardiomegaly or pericardial effusion. There is coronary vascular calcification. Mild atherosclerotic calcification of the thoracic aorta. No aneurysmal dilatation. The central pulmonary arteries are grossly unremarkable. Mediastinum/Nodes: No obvious hilar or mediastinal adenopathy. Evaluation however is limited due to factors above. The esophagus is  grossly unremarkable no mediastinal fluid collection. Lungs/Pleura: There are bibasilar dependent atelectasis. No focal consolidation, pleural effusion, or pneumothorax. The central airways are patent. Upper Abdomen: Cirrhosis and ascites. Partially visualized heterogeneous mass in the right lobe of the liver is not characterized on this CT. Musculoskeletal: No acute osseous pathology. IMPRESSION: 1. No acute intrathoracic pathology. No evidence of metastatic disease in the chest. 2. Cirrhosis and ascites. Partially visualized heterogeneous mass in the right lobe of the liver. 3. Aortic Atherosclerosis (ICD10-I70.0). Electronically Signed   By: Elgie Collard M.D.   On: 10/14/2023 14:08   MR ABDOMEN W WO CONTRAST Addendum Date: 10/13/2023 ADDENDUM REPORT: 10/13/2023 09:34 Electronically Signed   By: Jearld Lesch M.D.   On: 10/13/2023 09:34   Result Date: 10/13/2023 CLINICAL DATA:  Characterize right lobe liver mass, cirrhosis, right adrenal mass EXAM: MRI ABDOMEN WITHOUT AND WITH CONTRAST TECHNIQUE: Multiplanar multisequence MR imaging of the abdomen was performed both before and after the administration of intravenous contrast. CONTRAST:  9mL GADAVIST GADOBUTROL 1 MMOL/ML IV SOLN COMPARISON:  CT abdomen pelvis, 10/10/2023, CT abdomen pelvis, 12/03/2017 FINDINGS: Examination is generally limited by breath motion artifact throughout, particularly multiphasic contrast enhanced sequences. Lower chest: No acute abnormality. Hepatobiliary: Coarse, nodular cirrhotic morphology of the liver. Subtly hyperenhancing mass of the inferior right lobe of the liver, hepatic segment VI, with clear evidence of washout but without capsular enhancement, measuring 3.4 x 3.3 cm (series 23, image 56). Layering sludge and small gallstones (series 25, image 70). Gallbladder wall thickening and small volume pericholecystic fluid, nonspecific in the setting of ascites. No biliary ductal dilatation Pancreas: Unremarkable. No pancreatic  ductal  dilatation or surrounding inflammatory changes. Spleen: Normal in size without significant abnormality. Adrenals/Urinary Tract: Heterogeneously enhancing mass of the right adrenal gland measuring 4.1 x 2.3 cm. Normal left adrenal gland. Numerous bilateral renal cortical cysts, for which no further follow-up or characterization is required. Kidneys are otherwise normal, without obvious renal calculi, solid lesion, or hydronephrosis. Stomach/Bowel: Stomach is within normal limits. No evidence of bowel wall thickening, distention, or inflammatory changes. Vascular/Lymphatic: Aortic atherosclerosis. No enlarged abdominal lymph nodes. Other: No abdominal wall hernia. Anasarca. Small volume ascites throughout the abdomen. Musculoskeletal: No acute or significant osseous findings. IMPRESSION: 1. Examination is generally limited by breath motion artifact throughout, particularly multiphasic contrast enhanced sequences. 2. Within this limitation, there is a subtly hyperenhancing mass of the inferior right lobe of the liver, hepatic segment VI, with clear evidence of washout but without capsular enhancement, measuring 3.4 x 3.3 cm. This is consistent with hepatocellular carcinoma, LI-RADS category 5. 3. Heterogeneously enhancing mass of the right adrenal gland measuring 4.1 x 2.3 cm, new compared to prior examination dated 12/05/2017 and highly concerning for malignancy. Isolated adrenal metastasis of hepatocellular carcinoma is rare but has been reported in the literature. Normal left adrenal gland. 4. Cirrhosis and small volume ascites. 5. Layering sludge and small gallstones. Gallbladder wall thickening and small volume pericholecystic fluid, nonspecific in the setting of ascites. No biliary ductal dilatation. 6. Anasarca. Aortic Atherosclerosis (ICD10-I70.0). Electronically Signed: By: Jearld Lesch M.D. On: 10/13/2023 08:15   CT ABDOMEN PELVIS W CONTRAST Result Date: 10/10/2023 CLINICAL DATA:  Generalized  abdominal pain EXAM: CT ABDOMEN AND PELVIS WITH CONTRAST TECHNIQUE: Multidetector CT imaging of the abdomen and pelvis was performed using the standard protocol following bolus administration of intravenous contrast. RADIATION DOSE REDUCTION: This exam was performed according to the departmental dose-optimization program which includes automated exposure control, adjustment of the mA and/or kV according to patient size and/or use of iterative reconstruction technique. CONTRAST:  60mL OMNIPAQUE IOHEXOL 350 MG/ML SOLN COMPARISON:  CT scan from 2019 and abdominal ultrasound 09/20/2023 FINDINGS: Lower chest: The lung bases are clear of acute process. No pleural effusion or pulmonary lesions. The heart is normal in size. No pericardial effusion. Stable aortic and coronary artery calcifications. The distal esophagus and aorta are unremarkable. Hepatobiliary: Progressive cirrhotic changes involving the liver. Associated portal venous hypertension, portal venous collaterals, paraesophageal varices, mesenteric edema and small volume ascites. There is a heterogeneously enhancing 4 x 4 cm right hepatic lobe mass in segment 6. Findings highly suspicious for hepatoma. MRI abdomen without and with contrast is recommended for more definitive evaluation. No intrahepatic biliary dilatation. The portal and hepatic veins are patent. Calcified gallstones are noted in the gallbladder. The gallbladder wall is markedly thickened. This could reflect acute cholecystitis or changes related to cirrhosis. Right upper quadrant ultrasound may be helpful for further evaluation. No common bile duct dilatation. Pancreas: Unremarkable. No pancreatic ductal dilatation or surrounding inflammatory changes. Spleen: Normal size.  No focal lesions.  The splenic vein is patent. Adrenals/Urinary Tract: Heterogeneously enhancing right adrenal gland mass measures 3.8 x 2.2 cm. The left adrenal gland is normal. Numerous bilateral nonenhancing renal cysts not  requiring any further imaging evaluation or follow-up. Poor excretion of contrast through the kidneys is noted on the delayed images. Recommend correlation with renal function. The bladder is grossly normal. Stomach/Bowel: The stomach, duodenum and small are grossly normal without contrast. No inflammatory changes, mass lesions or obstructive findings. Right colonic wall thickening may be due to adjacent ascites and the  patient's cirrhosis. Colonic diverticulosis with focal area acute perforated diverticulitis involving the sigmoid colon. Multiple small locules of gas and significant surrounding inflammatory changes. No discrete drainable abscess. Vascular/Lymphatic: Age advanced atherosclerotic calcification involving the aorta, iliac arteries and branch vessels but no aneurysm. The major venous structures are patent. Scattered upper abdominal lymph nodes typical with cirrhosis. No retroperitoneal mass or adenopathy. No pelvic adenopathy. Reproductive: The uterus and ovaries are unremarkable. Other: Mesenteric edema, small volume abdominal/pelvic ascites and diffuse body wall edema. Musculoskeletal: No significant findings. Chronic changes of bilateral hip AVN. IMPRESSION: 1. Progressive cirrhotic changes involving the liver with associated portal venous hypertension, portal venous collaterals, paraesophageal varices, mesenteric edema and small volume ascites. 2. Heterogeneously enhancing 4 x 4 cm right hepatic lobe mass in segment 6. Findings highly suspicious for hepatoma. MRI abdomen without and with contrast is recommended for more definitive evaluation. 3. Heterogeneously enhancing right adrenal gland mass measures 3.8 x 2.2 cm. Findings are concerning for metastatic disease. 4. Cholelithiasis with marked gallbladder wall thickening. This could reflect acute cholecystitis or changes related to cirrhosis. Right upper quadrant ultrasound may be helpful for further evaluation. 5. Colonic diverticulosis with focal  area of acute perforated diverticulitis involving the sigmoid colon. Multiple small locules of gas and significant surrounding inflammatory changes. No discrete drainable abscess. 6. Poor excretion of contrast through the kidneys on the delayed images. Recommend correlation with renal function. 7. Age advanced atherosclerotic calcification involving the aorta, iliac arteries and branch vessels. 8. Aortic atherosclerosis. These results will be called to the ordering clinician or representative by the Radiologist Assistant, and communication documented in the PACS or [employer]. Electronically Signed   By: Rudie Meyer M.D.   On: 10/10/2023 09:24    No orders of the defined types were placed in this encounter.   CODE STATUS:  Code Status History     Date Active Date Inactive Code Status Order ID Comments User Context   10/10/2023 1942 10/15/2023 1540 Full Code [id number]  Charlsie Quest, MD ED   11/12/2017 1638 11/17/2017 1853 Full Code [id number]  Esperanza Sheets, MD ED   06/27/2017 1007 07/03/2017 1735 Full Code [id number]  Bernadene Person, NP ED   04/09/2015 2033 04/13/2015 1554 Full Code [id number]  Osvaldo Shipper, MD Inpatient   03/29/2015 0300 04/01/2015 1411 Full Code [id number]  Delano Metz, MD Inpatient   12/03/2011 0340 12/10/2011 1759 Full Code [id number]  Deterding, Dorise Hiss, MD Inpatient   12/03/2011 0234 12/03/2011 0340 Full Code [id number]  Curlene Dolphin, MD ED    Questions for Most Recent Historical Code Status (Order [id number])     Question Answer   By: Consent: discussion documented in EHR            Future Appointments  Date Time Provider Department Center  10/25/2023  7:00 AM [facility] PET CT 1 [facility]   11/26/2023  8:00 AM [facility] DIAG TOMO 1 [facility]  11/26/2023  8:10 AM [facility] [place] 1 [facility]     I spent a total of {CHL ONC TIME VISIT - FUXNA:[id number]} during this encounter with the patient including review of chart and  various tests results, discussions about plan of care and coordination of care plan.  This document was completed utilizing speech recognition software. Grammatical errors, random word insertions, pronoun errors, and incomplete sentences are an occasional consequence of this system due to software limitations, ambient noise, and hardware issues. Any formal questions or concerns about the content, text or  information contained within the body of this dictation should be directly addressed to the provider for clarification.

## 2023-10-22 NOTE — Progress Notes (Deleted)
   Established Patient Office Visit  Subjective   Patient ID: Dana Thornton, female    DOB: 1955-10-26  Age: 68 y.o. MRN: [id number]  No chief complaint on file.   HPI  {History (Optional):23778}  ROS    Objective:     There were no vitals taken for this visit. {Vitals History (Optional):23777}  Physical Exam   No results found for any visits on 10/22/23.  {Labs (Optional):23779}  The ASCVD Risk score (Arnett DK, et al., 2019) failed to calculate for the following reasons:   Cannot find a previous HDL lab   Cannot find a previous total cholesterol lab    Assessment & Plan:   Problem List Items Addressed This Visit   None   No follow-ups on file.    Alfonzo Feller, RN

## 2023-10-22 NOTE — Telephone Encounter (Signed)
Left a general voicemail to call Dr. Zenda Alpers office to reschedule appointment that was for 1pm today.

## 2023-10-24 ENCOUNTER — Ambulatory Visit: Payer: Medicare Other | Admitting: Hematology and Oncology

## 2023-10-25 ENCOUNTER — Ambulatory Visit (HOSPITAL_COMMUNITY)
Admission: RE | Admit: 2023-10-25 | Discharge: 2023-10-25 | Disposition: A | Discharge status: Home / Self Care | Payer: Medicare Other | Source: Ambulatory Visit | Attending: Oncology | Admitting: Oncology

## 2023-10-25 DIAGNOSIS — C220 Liver cell carcinoma: Secondary | ICD-10-CM | POA: Insufficient documentation

## 2023-10-25 LAB — GLUCOSE, CAPILLARY: Glucose-Capillary: 88 mg/dL (ref 70–99)

## 2023-10-25 MED ORDER — FLUDEOXYGLUCOSE F - 18 (FDG) INJECTION
10.7000 | Freq: Once | INTRAVENOUS | Status: AC | PRN
  Administered 2023-10-25: 10.7 via INTRAVENOUS

## 2023-10-29 ENCOUNTER — Other Ambulatory Visit: Payer: Self-pay

## 2023-10-29 ENCOUNTER — Inpatient Hospital Stay: Payer: Medicare Other | Attending: Oncology | Admitting: Oncology

## 2023-10-29 ENCOUNTER — Inpatient Hospital Stay: Payer: Medicare Other

## 2023-10-29 ENCOUNTER — Encounter: Payer: Self-pay | Admitting: Oncology

## 2023-10-29 DIAGNOSIS — Z885 Allergy status to narcotic agent status: Secondary | ICD-10-CM | POA: Diagnosis not present

## 2023-10-29 DIAGNOSIS — I10 Essential (primary) hypertension: Secondary | ICD-10-CM | POA: Diagnosis not present

## 2023-10-29 DIAGNOSIS — I8510 Secondary esophageal varices without bleeding: Secondary | ICD-10-CM | POA: Diagnosis not present

## 2023-10-29 DIAGNOSIS — K7460 Unspecified cirrhosis of liver: Secondary | ICD-10-CM | POA: Insufficient documentation

## 2023-10-29 DIAGNOSIS — K754 Autoimmune hepatitis: Secondary | ICD-10-CM | POA: Diagnosis not present

## 2023-10-29 DIAGNOSIS — D649 Anemia, unspecified: Secondary | ICD-10-CM

## 2023-10-29 DIAGNOSIS — Z8249 Family history of ischemic heart disease and other diseases of the circulatory system: Secondary | ICD-10-CM | POA: Diagnosis not present

## 2023-10-29 DIAGNOSIS — R21 Rash and other nonspecific skin eruption: Secondary | ICD-10-CM | POA: Insufficient documentation

## 2023-10-29 DIAGNOSIS — R188 Other ascites: Secondary | ICD-10-CM

## 2023-10-29 DIAGNOSIS — K8020 Calculus of gallbladder without cholecystitis without obstruction: Secondary | ICD-10-CM | POA: Insufficient documentation

## 2023-10-29 DIAGNOSIS — C220 Liver cell carcinoma: Secondary | ICD-10-CM

## 2023-10-29 DIAGNOSIS — Z79899 Other long term (current) drug therapy: Secondary | ICD-10-CM | POA: Diagnosis not present

## 2023-10-29 DIAGNOSIS — I700 Atherosclerosis of aorta: Secondary | ICD-10-CM | POA: Insufficient documentation

## 2023-10-29 DIAGNOSIS — Z841 Family history of disorders of kidney and ureter: Secondary | ICD-10-CM | POA: Diagnosis not present

## 2023-10-29 DIAGNOSIS — Z888 Allergy status to other drugs, medicaments and biological substances status: Secondary | ICD-10-CM | POA: Insufficient documentation

## 2023-10-29 DIAGNOSIS — C7971 Secondary malignant neoplasm of right adrenal gland: Secondary | ICD-10-CM

## 2023-10-29 DIAGNOSIS — R140 Abdominal distension (gaseous): Secondary | ICD-10-CM

## 2023-10-29 DIAGNOSIS — K5720 Diverticulitis of large intestine with perforation and abscess without bleeding: Secondary | ICD-10-CM | POA: Insufficient documentation

## 2023-10-29 DIAGNOSIS — K766 Portal hypertension: Secondary | ICD-10-CM | POA: Insufficient documentation

## 2023-10-29 DIAGNOSIS — C7970 Secondary malignant neoplasm of unspecified adrenal gland: Secondary | ICD-10-CM | POA: Insufficient documentation

## 2023-10-29 DIAGNOSIS — E785 Hyperlipidemia, unspecified: Secondary | ICD-10-CM

## 2023-10-29 DIAGNOSIS — E119 Type 2 diabetes mellitus without complications: Secondary | ICD-10-CM

## 2023-10-29 LAB — CMP (CANCER CENTER ONLY)
ALT: 93 U/L — ABNORMAL HIGH (ref 0–44)
AST: 192 U/L (ref 15–41)
Albumin: 2.2 g/dL — ABNORMAL LOW (ref 3.5–5.0)
Alkaline Phosphatase: 82 U/L (ref 38–126)
Anion gap: 4 — ABNORMAL LOW (ref 5–15)
BUN: 13 mg/dL (ref 8–23)
CO2: 28 mmol/L (ref 22–32)
Calcium: 9.3 mg/dL (ref 8.9–10.3)
Chloride: 100 mmol/L (ref 98–111)
Creatinine: 1.97 mg/dL — ABNORMAL HIGH (ref 0.44–1.00)
GFR, Estimated: 27 mL/min — ABNORMAL LOW (ref >60)
Glucose, Bld: 85 mg/dL (ref 70–99)
Potassium: 4.4 mmol/L (ref 3.5–5.1)
Sodium: 132 mmol/L — ABNORMAL LOW (ref 135–145)
Total Bilirubin: 4.5 mg/dL (ref 0.0–1.2)
Total Protein: 10.4 g/dL — ABNORMAL HIGH (ref 6.5–8.1)

## 2023-10-29 LAB — CBC WITH DIFFERENTIAL (CANCER CENTER ONLY)
Abs Immature Granulocytes: 0.02 10*3/uL (ref 0.00–0.07)
Basophils Absolute: 0.1 10*3/uL (ref 0.0–0.1)
Basophils Relative: 1 %
Eosinophils Absolute: 0.2 10*3/uL (ref 0.0–0.5)
Eosinophils Relative: 3 %
HCT: 29.7 % — ABNORMAL LOW (ref 36.0–46.0)
Hemoglobin: 10.4 g/dL — ABNORMAL LOW (ref 12.0–15.0)
Immature Granulocytes: 0 %
Lymphocytes Relative: 35 %
Lymphs Abs: 2.3 10*3/uL (ref 0.7–4.0)
MCH: 31.4 pg (ref 26.0–34.0)
MCHC: 35 g/dL (ref 30.0–36.0)
MCV: 89.7 fL (ref 80.0–100.0)
Monocytes Absolute: 0.4 10*3/uL (ref 0.1–1.0)
Monocytes Relative: 6 %
Neutro Abs: 3.6 10*3/uL (ref 1.7–7.7)
Neutrophils Relative %: 55 %
Platelet Count: 132 10*3/uL — ABNORMAL LOW (ref 150–400)
RBC: 3.31 MIL/uL — ABNORMAL LOW (ref 3.87–5.11)
RDW: 17.5 % — ABNORMAL HIGH (ref 11.5–15.5)
WBC Count: 6.5 10*3/uL (ref 4.0–10.5)
nRBC: 0 % (ref 0.0–0.2)

## 2023-10-29 LAB — FERRITIN: Ferritin: 303 ng/mL (ref 11–307)

## 2023-10-29 LAB — DIRECT ANTIGLOBULIN TEST (NOT AT ARMC)
DAT, IgG: NEGATIVE
DAT, complement: NEGATIVE

## 2023-10-29 LAB — FOLATE: Folate: 7.4 ng/mL (ref >5.9)

## 2023-10-29 LAB — APTT: aPTT: 48 s — ABNORMAL HIGH (ref 24–36)

## 2023-10-29 LAB — PROTIME-INR
INR: 2.2 — ABNORMAL HIGH (ref 0.8–1.2)
Prothrombin Time: 24.9 s — ABNORMAL HIGH (ref 11.4–15.2)

## 2023-10-29 LAB — IRON AND TIBC
Iron: 134 ug/dL (ref 28–170)
Saturation Ratios: 92 % — ABNORMAL HIGH (ref 10.4–31.8)
TIBC: 146 ug/dL — ABNORMAL LOW (ref 250–450)
UIBC: 12 ug/dL

## 2023-10-29 LAB — LACTATE DEHYDROGENASE: LDH: 193 U/L — ABNORMAL HIGH (ref 98–192)

## 2023-10-29 LAB — VITAMIN B12: Vitamin B-12: 1403 pg/mL — ABNORMAL HIGH (ref 180–914)

## 2023-10-29 NOTE — Progress Notes (Signed)
 [facility]  ONCOLOGY CONSULT NOTE   PATIENT NAME: [facility]   MR#: [id number] DOB: Sep 17, 1955  DATE OF SERVICE: 10/29/2023   [facility] follow-up.  Patient Care Team: Ollen Bowl, MD as PCP - General (Internal Medicine) Charlott Rakes, MD as Consulting Physician (Gastroenterology)    CHIEF COMPLAINT/ PURPOSE OF CONSULTATION:   Newly diagnosed hepatocellular carcinoma based on MRI abdomen, LI-RADS 5.   ASSESSMENT & PLAN:   Dana [facility] is a 68 y.o. pleasant lady with a past medical history of autoimmune hepatitis, for which she was seeing gastroenterologist Dr. Bosie Clos, diabetes mellitus, hypertension, dyslipidemia, was referred to our clinic for newly diagnosed hepatocellular carcinoma, noted on MRI abdomen.  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 Confirmed via MRI. Complicated by autoimmune hepatitis.  Her case was presented at GI tumor conference by another provider on 10/16/2023.  It was determined that she is not a candidate for resection.  Plan was to obtain PET/CT following which a referral for liver directed therapy including Y90.  Also radiation oncology referral was to be considered for possible SBRT to adrenal lesion.  Today I discussed diagnosis, staging, prognosis, plan of care, treatment options.  Reviewed NCCN guidelines.  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   Plan for systemic treatment with tremelimumab plus durvalumab, pending discussion in tumor conference again regarding possibility of Y90 or other liver directed treatments.  Her history of autoimmune hepatitis could complicate immunotherapy.  Potential side effects: autoimmune reactions affecting various organs. Alternative: chemotherapy with oral TKIs with side effects including anorexia, dysgeusia, rash, cytopenias, nausea, thrombosis, and alopecia.    Immunotherapy generally well-tolerated; serious reactions <5%.  Treatment decision to be revisited after multidisciplinary conference and we will discuss her case next week in conference.    PET scan performed on 10/25/2023.  Results pending.  Will follow-up.  Will arrange education session for immunotherapy appropriately.  - Plan to start treatment within two weeks  - Schedule follow-up appointment in two weeks  Ascites Significant ascites likely secondary to liver cirrhosis. Diuretics ineffective. Reports abdominal discomfort and tightness. Potential need for abdominal paracentesis discussed. - Refer to interventional radiology for abdominal paracentesis  Autoimmune hepatitis (HCC) Managed with prednisone and mercaptopurine. Complicates immunotherapy due to risk of exacerbation. Close monitoring of liver function tests required. Discussed potential hepatitis flare-up with immunotherapy. - Monitor liver function tests - Consult with gastroenterologist Dr. Bosie Clos regarding management during immunotherapy  For now plan is to see her back in clinic in 2 weeks with treatment initiation around that time.  I reviewed lab results and outside records for this visit and discussed relevant results with the patient. Diagnosis, plan of care and treatment options were also discussed in detail with the patient. Opportunity provided to ask questions and answers provided to her apparent satisfaction. Provided instructions to call our clinic with any problems, questions or concerns prior to return visit. I recommended to continue follow-up with PCP and sub-specialists. She verbalized understanding and agreed with the plan. No barriers to learning was detected.  NCCN guidelines have been consulted in the planning of this patient's care.  [name], MD  10/29/2023 5:11 PM  [facility] - A DEPT OF [facility] [address] [place] [address] Dept: [phone] Dept Fax: [phone]   HISTORY OF PRESENTING ILLNESS:   Discussed the  use of AI scribe software for clinical note transcription with the patient, who gave verbal consent to proceed.   I have reviewed her chart and materials related to her cancer extensively and collaborated history with the patient. Summary of oncologic history is as follows:  ONCOLOGY HISTORY:  She presented to the ED on 10/10/2023 due to abdominal pain that was radiating to her back and abnormal findings on the CT scan that was ordered by her primary physician.  CT of abdomen pelvis on 10/10/2023 showed heterogeneously enhancing 4 x 4 cm right hepatic lobe mass in segment 6, highly suspicious for hepatoma.  MRI recommended for further evaluation.  Also noted was heterogeneously enhancing 3.8 x 2.2 cm right adrenal gland mass concerning for metastatic disease.  Therefore oncology consult was requested when the patient was hospitalized.  Patient was evaluated by Dr. Al Pimple.  AFP was significantly elevated at 458 ng/mL on 10/10/2023.  Medical history includes hypertension, diabetes, history of gallstones and cirrhosis for which she sees her primary GI Dr. Bosie Clos.  Patient has underlying history of cirrhosis however never has been decompensated and did not require paracentesis, variceal ligation or had history of encephalopathy.  She follows up with Dr. Bosie Clos in gastroenterology.  On 10/13/2023, MRI abdomen with and without contrast was obtained for further evaluation.  Examination was limited by breath motion artifact.  There was subtly hyperenhancing mass of inferior right lobe of liver, hepatic segment 6 with clear evidence of washout but without capsular enhancement, measuring 3.4 x 3.3 cm.  This is consistent with hepatocellular carcinoma, LI-RADS category 5.  Also noted was heterogeneously enhancing mass of the right adrenal gland measuring 4.1 x 2.3 cm, new compared to  March 2019 and highly concerning for malignancy.  Isolated adrenal metastatic hepatocellular carcinoma is rare but has been reported in the literature.  Cirrhosis and small volume ascites was noted.  Anasarca noted.  On 10/14/2023, CT chest without contrast showed no evidence of intrathoracic metastatic disease.   Her case was presented at GI tumor conference by Dr. Al Pimple on 10/16/2023.  It was determined that she is not a candidate for resection.  Plan was to obtain PET/CT following which a referral for liver directed therapy including Y90.  Also radiation oncology referral was to be considered for possible SBRT to adrenal lesion.  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   Plan for systemic treatment with tremelimumab plus durvalumab, pending discussion in tumor conference again regarding possibility of Y90 or other liver directed treatments.  Her history of autoimmune hepatitis could complicate immunotherapy.  INTERVAL HISTORY:  She reports a history of autoimmune hepatitis. She presents with abdominal pain and fluid build-up. . The patient reports that the fluid build-up has not decreased despite being on two fluid pills. The patient also reports a rash for which she has been given a cream. The patient has lost significant weight in the past two years and has been experiencing a decrease in mobility.  MEDICAL HISTORY:  Past Medical History:  Diagnosis Date   Hypercholesterolemia    Hypertension    Kidney stones    Type II diabetes mellitus (HCC)     SURGICAL HISTORY: Past Surgical History:  Procedure Laterality Date   BREAST EXCISIONAL BIOPSY Left    CESAREAN SECTION  1999   DILATION AND CURETTAGE OF UTERUS     "I lost a baby"   IR RADIOLOGIST EVAL & MGMT  11/21/2017   IR RADIOLOGIST EVAL & MGMT  12/03/2017  TUBAL LIGATION  1999    SOCIAL HISTORY: Social History   Socioeconomic History   Marital status: Married    Spouse name: Not on file   Number of children: Not  on file   Years of education: Not on file   Highest education level: Not on file  Occupational History   Not on file  Tobacco Use   Smoking status: Never   Smokeless tobacco: Never  Substance and Sexual Activity   Alcohol use: No   Drug use: No   Sexual activity: Yes    Partners: Male  Other Topics Concern   Not on file  Social History Narrative   Not on file   Social Drivers of Health   Financial Resource Strain: Not on file  Food Insecurity: No Food Insecurity (10/29/2023)   Hunger Vital Sign    Worried About Running Out of Food in the Last Year: Never true    Ran Out of Food in the Last Year: Never true  Transportation Needs: No Transportation Needs (10/29/2023)   PRAPARE - Administrator, Civil Service (Medical): No    Lack of Transportation (Non-Medical): No  Physical Activity: Not on file  Stress: Not on file  Social Connections: Socially Integrated (10/10/2023)   Social Connection and Isolation Panel [NHANES]    Frequency of Communication with Friends and Family: More than three times a week    Frequency of Social Gatherings with Friends and Family: Three times a week    Attends Religious Services: More than 4 times per year    Active Member of Clubs or Organizations: Yes    Attends Banker Meetings: More than 4 times per year    Marital Status: Married  Catering manager Violence: Not At Risk (10/29/2023)   Humiliation, Afraid, Rape, and Kick questionnaire    Fear of Current or Ex-Partner: No    Emotionally Abused: No    Physically Abused: No    Sexually Abused: No    FAMILY HISTORY: Family History  Problem Relation Age of Onset   Kidney failure Father    Hypertension Father     ALLERGIES:  She is allergic to ace inhibitors, codeine, and statins.  MEDICATIONS:  Current Outpatient Medications  Medication Sig Dispense Refill   ALEVE 220 MG tablet Take 220-440 mg by mouth 2 (two) times daily as needed (for pain or headaches).      amLODipine (NORVASC) 10 MG tablet Take 1 tablet (10 mg total) by mouth daily. 30 tablet 1   carvedilol (COREG) 25 MG tablet Take 25 mg by mouth 2 (two) times daily with a meal.     ezetimibe (ZETIA) 10 MG tablet Take 10 mg by mouth daily.     furosemide (LASIX) 40 MG tablet Take 40 mg by mouth daily as needed for fluid or edema.     insulin aspart (NOVOLOG) 100 UNIT/ML injection Before each meal 3 times a day, 120-150 - 2 units, 151-200 - 4 units, 201-250 - 8 units, 251-300 - 10 units,  301 or above 10 units and call your MD 1 vial 1   insulin glargine (LANTUS) 100 UNIT/ML injection 30  Units sq Qhs and 12 units q am (Patient taking differently: Inject 10 Units into the skin at bedtime as needed (for elevated BGL).) 10 mL 2   insulin lispro (HUMALOG KWIKPEN) 100 UNIT/ML KwikPen Inject 10 Units into the skin 3 (three) times daily with meals as needed (AS DIRECTED FOR AN ELEVATED BGL).  levothyroxine (SYNTHROID) 300 MCG tablet Take 150-300 mcg by mouth See admin instructions. Take 150 mcg by mouth 30 minutes before breakfast on Sunday and Saturday and 300 mcg on Mon/Tues/Wed/Thurs/Fri     losartan (COZAAR) 100 MG tablet Take 100 mg by mouth daily.     mercaptopurine (PURINETHOL) 50 MG tablet Take 50 mg by mouth daily. Give on an empty stomach 1 hour before or 2 hours after meals. Caution: Chemotherapy.     ondansetron (ZOFRAN) 4 MG tablet Take 4 mg by mouth every 6 (six) hours as needed for nausea or vomiting.     predniSONE (DELTASONE) 10 MG tablet Take 2 tablets (20 mg) daily with food for 4 days, then 1 tablet (10 mg) daily with food until seen by your GI doctor (Patient taking differently: Take 10 mg by mouth daily with breakfast.) 30 tablet 1   Spacer/Aero-Holding Chambers (BREATHERITE COLL SPACER ADULT) MISC 1 Device by Does not apply route every 6 (six) hours as needed. 1 each 0   spironolactone (ALDACTONE) 25 MG tablet Take 25 mg by mouth daily as needed (AS DIRECTED).     triamcinolone cream  (KENALOG) 0.1 % Apply 1 Application topically 2 (two) times daily as needed (for irritation, as directed- affected area).     TRULICITY 0.75 MG/0.5ML SOAJ Inject 0.75 mg into the skin every Sunday.     Vitamin D, Ergocalciferol, (DRISDOL) 50000 units CAPS capsule Take 50,000 Units by mouth every Sunday.     acetaminophen (TYLENOL) 325 MG tablet Take 2 tablets (650 mg total) by mouth every 6 (six) hours as needed for mild pain, moderate pain, fever or headache. (Patient not taking: Reported on 10/29/2023) 100 tablet 0   albuterol (PROVENTIL HFA;VENTOLIN HFA) 108 (90 Base) MCG/ACT inhaler Inhale 2 puffs into the lungs every 4 (four) hours as needed for wheezing or shortness of breath. (Patient not taking: Reported on 10/29/2023) 1 Inhaler 2   lactobacillus acidophilus & bulgar (LACTINEX) chewable tablet Chew 1 tablet by mouth 3 (three) times daily with meals. (Patient not taking: Reported on 10/29/2023) 30 tablet 1   No current facility-administered medications for this visit.    REVIEW OF SYSTEMS:    Review of Systems - Oncology  All other pertinent systems were reviewed with the patient and are negative.  PHYSICAL EXAMINATION:   Onc Performance Status - 10/29/23 0852       ECOG Perf Status   ECOG Perf Status Restricted in physically strenuous activity but ambulatory and able to carry out work of a light or sedentary nature, e.g., light house work, office work      KPS SCALE   KPS % SCORE Normal activity with effort, some s/s of disease             There were no vitals filed for this visit. There were no vitals filed for this visit.  Physical Exam Constitutional:      General: She is not in acute distress.    Appearance: Normal appearance.  HENT:     Head: Normocephalic and atraumatic.  Eyes:     General: No scleral icterus.    Conjunctiva/sclera: Conjunctivae normal.  Cardiovascular:     Rate and Rhythm: Normal rate and regular rhythm.     Heart sounds: Normal heart  sounds.  Pulmonary:     Effort: Pulmonary effort is normal.     Breath sounds: Normal breath sounds.  Abdominal:     General: There is distension.     Palpations: Abdomen  is soft.     Tenderness: There is no abdominal tenderness.  Neurological:     Mental Status: She is alert and oriented to person, place, and time.  Psychiatric:        Mood and Affect: Mood normal.        Behavior: Behavior normal.      LABORATORY DATA:   I have reviewed the data as listed.  Results for orders placed or performed in visit on 10/29/23  Ferritin  Result Value Ref Range   Ferritin 303 11 - 307 ng/mL  APTT  Result Value Ref Range   aPTT 48 (H) 24 - 36 seconds  Protime-INR  Result Value Ref Range   Prothrombin Time 24.9 (H) 11.4 - 15.2 seconds   INR 2.2 (H) 0.8 - 1.2  Lactate dehydrogenase  Result Value Ref Range   LDH 193 (H) 98 - 192 U/L  CMP (Cancer Center only)  Result Value Ref Range   Sodium 132 (L) 135 - 145 mmol/L   Potassium 4.4 3.5 - 5.1 mmol/L   Chloride 100 98 - 111 mmol/L   CO2 28 22 - 32 mmol/L   Glucose, Bld 85 70 - 99 mg/dL   BUN 13 8 - 23 mg/dL   Creatinine 1.61 (H) 0.96 - 1.00 mg/dL   Calcium 9.3 8.9 - 04.5 mg/dL   Total Protein 40.9 (H) 6.5 - 8.1 g/dL   Albumin 2.2 (L) 3.5 - 5.0 g/dL   AST 811 (HH) 15 - 41 U/L   ALT 93 (H) 0 - 44 U/L   Alkaline Phosphatase 82 38 - 126 U/L   Total Bilirubin 4.5 (HH) 0.0 - 1.2 mg/dL   GFR, Estimated 27 (L) >60 mL/min   Anion gap 4 (L) 5 - 15  CBC with Differential (Cancer Center Only)  Result Value Ref Range   WBC Count 6.5 4.0 - 10.5 K/uL   RBC 3.31 (L) 3.87 - 5.11 MIL/uL   Hemoglobin 10.4 (L) 12.0 - 15.0 g/dL   HCT 91.4 (L) 78.2 - 95.6 %   MCV 89.7 80.0 - 100.0 fL   MCH 31.4 26.0 - 34.0 pg   MCHC 35.0 30.0 - 36.0 g/dL   RDW 21.3 (H) 08.6 - 57.8 %   Platelet Count 132 (L) 150 - 400 K/uL   nRBC 0.0 0.0 - 0.2 %   Neutrophils Relative % 55 %   Neutro Abs 3.6 1.7 - 7.7 K/uL   Lymphocytes Relative 35 %   Lymphs Abs 2.3 0.7  - 4.0 K/uL   Monocytes Relative 6 %   Monocytes Absolute 0.4 0.1 - 1.0 K/uL   Eosinophils Relative 3 %   Eosinophils Absolute 0.2 0.0 - 0.5 K/uL   Basophils Relative 1 %   Basophils Absolute 0.1 0.0 - 0.1 K/uL   Immature Granulocytes 0 %   Abs Immature Granulocytes 0.02 0.00 - 0.07 K/uL  Direct antiglobulin test (not at [facility])  Result Value Ref Range   DAT, complement NEG    DAT, IgG      NEG Performed at [facility], [address], [place], [place] [address]      Recent Labs    10/14/23 0303 10/15/23 0329 10/29/23 1000  NA 137 138 132*  K 4.1 4.1 4.4  CL 113* 111 100  CO2 20* 19* 28  GLUCOSE 104* 83 85  BUN 18 12 13   CREATININE 2.00* 1.53* 1.97*  CALCIUM 7.6* 7.7* 9.3  GFRNONAA 27* 37* 27*  PROT  7.1 7.0 10.4*  ALBUMIN 2.4* 2.2* 2.2*  AST 97* 95* 192*  ALT 64* 61* 93*  ALKPHOS 57 59 82  BILITOT 3.0* 2.8* 4.5*      RADIOGRAPHIC STUDIES:  I have personally reviewed the radiological images as listed and agree with the findings in the report.  CT CHEST WO CONTRAST Result Date: 10/14/2023 CLINICAL DATA:  Hepatocellular carcinoma staging. EXAM: CT CHEST WITHOUT CONTRAST TECHNIQUE: Multidetector CT imaging of the chest was performed following the standard protocol without IV contrast. RADIATION DOSE REDUCTION: This exam was performed according to the departmental dose-optimization program which includes automated exposure control, adjustment of the mA and/or kV according to patient size and/or use of iterative reconstruction technique. COMPARISON:  Abdominal MRI dated 10/13/2023. FINDINGS: Evaluation of this exam is limited in the absence of intravenous contrast as well as due to respiratory motion. Cardiovascular: There is no cardiomegaly or pericardial effusion. There is coronary vascular calcification. Mild atherosclerotic calcification of the thoracic aorta. No aneurysmal dilatation. The central pulmonary arteries are grossly unremarkable.  Mediastinum/Nodes: No obvious hilar or mediastinal adenopathy. Evaluation however is limited due to factors above. The esophagus is grossly unremarkable no mediastinal fluid collection. Lungs/Pleura: There are bibasilar dependent atelectasis. No focal consolidation, pleural effusion, or pneumothorax. The central airways are patent. Upper Abdomen: Cirrhosis and ascites. Partially visualized heterogeneous mass in the right lobe of the liver is not characterized on this CT. Musculoskeletal: No acute osseous pathology. IMPRESSION: 1. No acute intrathoracic pathology. No evidence of metastatic disease in the chest. 2. Cirrhosis and ascites. Partially visualized heterogeneous mass in the right lobe of the liver. 3. Aortic Atherosclerosis (ICD10-I70.0). Electronically Signed   By: Elgie Collard M.D.   On: 10/14/2023 14:08   MR ABDOMEN W WO CONTRAST Addendum Date: 10/13/2023 ADDENDUM REPORT: 10/13/2023 09:34 Electronically Signed   By: Jearld Lesch M.D.   On: 10/13/2023 09:34   Result Date: 10/13/2023 CLINICAL DATA:  Characterize right lobe liver mass, cirrhosis, right adrenal mass EXAM: MRI ABDOMEN WITHOUT AND WITH CONTRAST TECHNIQUE: Multiplanar multisequence MR imaging of the abdomen was performed both before and after the administration of intravenous contrast. CONTRAST:  9mL GADAVIST GADOBUTROL 1 MMOL/ML IV SOLN COMPARISON:  CT abdomen pelvis, 10/10/2023, CT abdomen pelvis, 12/03/2017 FINDINGS: Examination is generally limited by breath motion artifact throughout, particularly multiphasic contrast enhanced sequences. Lower chest: No acute abnormality. Hepatobiliary: Coarse, nodular cirrhotic morphology of the liver. Subtly hyperenhancing mass of the inferior right lobe of the liver, hepatic segment VI, with clear evidence of washout but without capsular enhancement, measuring 3.4 x 3.3 cm (series 23, image 56). Layering sludge and small gallstones (series 25, image 70). Gallbladder wall thickening and small  volume pericholecystic fluid, nonspecific in the setting of ascites. No biliary ductal dilatation Pancreas: Unremarkable. No pancreatic ductal dilatation or surrounding inflammatory changes. Spleen: Normal in size without significant abnormality. Adrenals/Urinary Tract: Heterogeneously enhancing mass of the right adrenal gland measuring 4.1 x 2.3 cm. Normal left adrenal gland. Numerous bilateral renal cortical cysts, for which no further follow-up or characterization is required. Kidneys are otherwise normal, without obvious renal calculi, solid lesion, or hydronephrosis. Stomach/Bowel: Stomach is within normal limits. No evidence of bowel wall thickening, distention, or inflammatory changes. Vascular/Lymphatic: Aortic atherosclerosis. No enlarged abdominal lymph nodes. Other: No abdominal wall hernia. Anasarca. Small volume ascites throughout the abdomen. Musculoskeletal: No acute or significant osseous findings. IMPRESSION: 1. Examination is generally limited by breath motion artifact throughout, particularly multiphasic contrast enhanced sequences. 2. Within this limitation, there is  a subtly hyperenhancing mass of the inferior right lobe of the liver, hepatic segment VI, with clear evidence of washout but without capsular enhancement, measuring 3.4 x 3.3 cm. This is consistent with hepatocellular carcinoma, LI-RADS category 5. 3. Heterogeneously enhancing mass of the right adrenal gland measuring 4.1 x 2.3 cm, new compared to prior examination dated 12/05/2017 and highly concerning for malignancy. Isolated adrenal metastasis of hepatocellular carcinoma is rare but has been reported in the literature. Normal left adrenal gland. 4. Cirrhosis and small volume ascites. 5. Layering sludge and small gallstones. Gallbladder wall thickening and small volume pericholecystic fluid, nonspecific in the setting of ascites. No biliary ductal dilatation. 6. Anasarca. Aortic Atherosclerosis (ICD10-I70.0). Electronically Signed:  By: Jearld Lesch M.D. On: 10/13/2023 08:15   CT ABDOMEN PELVIS W CONTRAST Result Date: 10/10/2023 CLINICAL DATA:  Generalized abdominal pain EXAM: CT ABDOMEN AND PELVIS WITH CONTRAST TECHNIQUE: Multidetector CT imaging of the abdomen and pelvis was performed using the standard protocol following bolus administration of intravenous contrast. RADIATION DOSE REDUCTION: This exam was performed according to the departmental dose-optimization program which includes automated exposure control, adjustment of the mA and/or kV according to patient size and/or use of iterative reconstruction technique. CONTRAST:  60mL OMNIPAQUE IOHEXOL 350 MG/ML SOLN COMPARISON:  CT scan from 2019 and abdominal ultrasound 09/20/2023 FINDINGS: Lower chest: The lung bases are clear of acute process. No pleural effusion or pulmonary lesions. The heart is normal in size. No pericardial effusion. Stable aortic and coronary artery calcifications. The distal esophagus and aorta are unremarkable. Hepatobiliary: Progressive cirrhotic changes involving the liver. Associated portal venous hypertension, portal venous collaterals, paraesophageal varices, mesenteric edema and small volume ascites. There is a heterogeneously enhancing 4 x 4 cm right hepatic lobe mass in segment 6. Findings highly suspicious for hepatoma. MRI abdomen without and with contrast is recommended for more definitive evaluation. No intrahepatic biliary dilatation. The portal and hepatic veins are patent. Calcified gallstones are noted in the gallbladder. The gallbladder wall is markedly thickened. This could reflect acute cholecystitis or changes related to cirrhosis. Right upper quadrant ultrasound may be helpful for further evaluation. No common bile duct dilatation. Pancreas: Unremarkable. No pancreatic ductal dilatation or surrounding inflammatory changes. Spleen: Normal size.  No focal lesions.  The splenic vein is patent. Adrenals/Urinary Tract: Heterogeneously enhancing  right adrenal gland mass measures 3.8 x 2.2 cm. The left adrenal gland is normal. Numerous bilateral nonenhancing renal cysts not requiring any further imaging evaluation or follow-up. Poor excretion of contrast through the kidneys is noted on the delayed images. Recommend correlation with renal function. The bladder is grossly normal. Stomach/Bowel: The stomach, duodenum and small are grossly normal without contrast. No inflammatory changes, mass lesions or obstructive findings. Right colonic wall thickening may be due to adjacent ascites and the patient's cirrhosis. Colonic diverticulosis with focal area acute perforated diverticulitis involving the sigmoid colon. Multiple small locules of gas and significant surrounding inflammatory changes. No discrete drainable abscess. Vascular/Lymphatic: Age advanced atherosclerotic calcification involving the aorta, iliac arteries and branch vessels but no aneurysm. The major venous structures are patent. Scattered upper abdominal lymph nodes typical with cirrhosis. No retroperitoneal mass or adenopathy. No pelvic adenopathy. Reproductive: The uterus and ovaries are unremarkable. Other: Mesenteric edema, small volume abdominal/pelvic ascites and diffuse body wall edema. Musculoskeletal: No significant findings. Chronic changes of bilateral hip AVN. IMPRESSION: 1. Progressive cirrhotic changes involving the liver with associated portal venous hypertension, portal venous collaterals, paraesophageal varices, mesenteric edema and small volume ascites. 2. Heterogeneously enhancing  4 x 4 cm right hepatic lobe mass in segment 6. Findings highly suspicious for hepatoma. MRI abdomen without and with contrast is recommended for more definitive evaluation. 3. Heterogeneously enhancing right adrenal gland mass measures 3.8 x 2.2 cm. Findings are concerning for metastatic disease. 4. Cholelithiasis with marked gallbladder wall thickening. This could reflect acute cholecystitis or changes  related to cirrhosis. Right upper quadrant ultrasound may be helpful for further evaluation. 5. Colonic diverticulosis with focal area of acute perforated diverticulitis involving the sigmoid colon. Multiple small locules of gas and significant surrounding inflammatory changes. No discrete drainable abscess. 6. Poor excretion of contrast through the kidneys on the delayed images. Recommend correlation with renal function. 7. Age advanced atherosclerotic calcification involving the aorta, iliac arteries and branch vessels. 8. Aortic atherosclerosis. These results will be called to the ordering clinician or representative by the Radiologist Assistant, and communication documented in the PACS or [employer]. Electronically Signed   By: Rudie Meyer M.D.   On: 10/10/2023 09:24    Orders Placed This Encounter  Procedures   CBC with Differential (Cancer Center Only)    Standing Status:   Future    Number of Occurrences:   1    Expiration Date:   10/28/2024   CMP (Cancer Center only)    Standing Status:   Future    Number of Occurrences:   1    Expiration Date:   10/28/2024   Lactate dehydrogenase    Standing Status:   Future    Number of Occurrences:   1    Expiration Date:   10/28/2024   Protime-INR    Standing Status:   Future    Number of Occurrences:   1    Expiration Date:   10/28/2024   APTT    Standing Status:   Future    Number of Occurrences:   1    Expiration Date:   10/28/2024   AFP tumor marker    Standing Status:   Future    Number of Occurrences:   1    Expiration Date:   10/28/2024   Iron and TIBC    Standing Status:   Future    Number of Occurrences:   1    Expiration Date:   10/28/2024   Ferritin    Standing Status:   Future    Number of Occurrences:   1    Expiration Date:   10/28/2024   Vitamin B12    Standing Status:   Future    Number of Occurrences:   1    Expiration Date:   10/28/2024   Folate    Standing Status:   Future    Number of Occurrences:   1     Expiration Date:   10/28/2024   Haptoglobin    Standing Status:   Future    Number of Occurrences:   1    Expiration Date:   10/28/2024   Direct antiglobulin test (not at [facility])    Standing Status:   Future    Number of Occurrences:   1    Expiration Date:   10/28/2024    CODE STATUS:  Code Status History     Date Active Date Inactive Code Status Order ID Comments User Context   10/10/2023 1942 10/15/2023 1540 Full Code [id number]  Charlsie Quest, MD ED   11/12/2017 1638 11/17/2017 1853 Full Code [id number]  Esperanza Sheets, MD ED   06/27/2017 1007 07/03/2017 1735 Full Code  [id number]  Bernadene Person, NP ED   04/09/2015 2033 04/13/2015 1554 Full Code [id number]  Osvaldo Shipper, MD Inpatient   03/29/2015 0300 04/01/2015 1411 Full Code [id number]  Delano Metz, MD Inpatient   12/03/2011 0340 12/10/2011 1759 Full Code [id number]  Zigmund Gottron, MD Inpatient   12/03/2011 0234 12/03/2011 0340 Full Code [id number]  Curlene Dolphin, MD ED    Questions for Most Recent Historical Code Status (Order [id number])     Question Answer   By: Consent: discussion documented in EHR            Future Appointments  Date Time Provider Department Center  11/04/2023  9:00 AM [facility] 4 [facility]  11/12/2023  8:30 AM Rosco Harriott, MD [facility] None  11/26/2023  8:00 AM [facility] DIAG [place] 1 [facility]  11/26/2023  8:10 AM GI[facility] [place] 1 [facility]     I spent a total of 60 minutes during this encounter with the patient including review of chart and various tests results, discussions about plan of care and coordination of care plan.  This document was completed utilizing speech recognition software. Grammatical errors, random word insertions, pronoun errors, and incomplete sentences are an occasional consequence of this system due to software limitations, ambient noise, and hardware issues. Any formal questions or concerns about the content, text or information contained within the body  of this dictation should be directly addressed to the provider for clarification.

## 2023-10-29 NOTE — Progress Notes (Unsigned)
 CRITICAL VALUE STICKER  CRITICAL VALUE:  Total Bilirubin:  4.5 Ast:  192  RECEIVER (on-site recipient of call): Alfonzo Feller, RN  DATE & TIME NOTIFIED:  10/29/2023  1045  MESSENGER (representative from lab): Jose Persia  MD NOTIFIED: Dr. Arlana Pouch  TIME OF NOTIFICATION:1050

## 2023-10-29 NOTE — Assessment & Plan Note (Addendum)
 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 Confirmed via MRI. Complicated by autoimmune hepatitis.  Her case was presented at GI tumor conference by another provider on 10/16/2023.  It was determined that she is not a candidate for resection.  Plan was to obtain PET/CT following which a referral for liver directed therapy including Y90.  Also radiation oncology referral was to be considered for possible SBRT to adrenal lesion.  Today I discussed diagnosis, staging, prognosis, plan of care, treatment options.  Reviewed NCCN guidelines.  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   Plan for systemic treatment with tremelimumab plus durvalumab, pending discussion in tumor conference again regarding possibility of Y90 or other liver directed treatments.  Her history of autoimmune hepatitis could complicate immunotherapy.  Potential side effects: autoimmune reactions affecting various organs. Alternative: chemotherapy with oral TKIs with side effects including anorexia, dysgeusia, rash, cytopenias, nausea, thrombosis, and alopecia.   Immunotherapy generally well-tolerated; serious reactions <5%.  Treatment decision to be revisited after multidisciplinary conference and we will discuss her case next week in conference.    PET scan performed on 10/25/2023.  Results pending.  Will follow-up.  Will arrange education session for immunotherapy appropriately.  - Plan to start treatment within two weeks  - Schedule follow-up appointment in two weeks

## 2023-10-29 NOTE — Assessment & Plan Note (Signed)
 Significant ascites likely secondary to liver cirrhosis. Diuretics ineffective. Reports abdominal discomfort and tightness. Potential need for abdominal paracentesis discussed. - Refer to interventional radiology for abdominal paracentesis

## 2023-10-29 NOTE — Assessment & Plan Note (Signed)
 Managed with prednisone and mercaptopurine. Complicates immunotherapy due to risk of exacerbation. Close monitoring of liver function tests required. Discussed potential hepatitis flare-up with immunotherapy. - Monitor liver function tests - Consult with gastroenterologist Dr. Bosie Clos regarding management during immunotherapy

## 2023-10-30 LAB — AFP TUMOR MARKER: AFP, Serum, Tumor Marker: 629 ng/mL — ABNORMAL HIGH (ref 0.0–9.2)

## 2023-10-30 LAB — HAPTOGLOBIN: Haptoglobin: <10 mg/dL — ABNORMAL LOW (ref 37–355)

## 2023-11-04 ENCOUNTER — Other Ambulatory Visit: Payer: Self-pay

## 2023-11-04 ENCOUNTER — Emergency Department (HOSPITAL_COMMUNITY)
Admission: EM | Admit: 2023-11-04 | Discharge: 2023-11-04 | Disposition: A | Discharge status: Home / Self Care | Payer: Medicare Other | Attending: Emergency Medicine | Admitting: Emergency Medicine

## 2023-11-04 ENCOUNTER — Other Ambulatory Visit: Payer: Self-pay | Admitting: Oncology

## 2023-11-04 ENCOUNTER — Ambulatory Visit (HOSPITAL_COMMUNITY)
Admission: RE | Admit: 2023-11-04 | Discharge: 2023-11-04 | Disposition: A | Discharge status: Home / Self Care | Payer: Medicare Other | Source: Ambulatory Visit | Attending: Oncology | Admitting: Oncology

## 2023-11-04 DIAGNOSIS — K7460 Unspecified cirrhosis of liver: Secondary | ICD-10-CM | POA: Insufficient documentation

## 2023-11-04 DIAGNOSIS — Z7985 Long-term (current) use of injectable non-insulin antidiabetic drugs: Secondary | ICD-10-CM | POA: Insufficient documentation

## 2023-11-04 DIAGNOSIS — I959 Hypotension, unspecified: Secondary | ICD-10-CM | POA: Insufficient documentation

## 2023-11-04 DIAGNOSIS — R188 Other ascites: Secondary | ICD-10-CM | POA: Insufficient documentation

## 2023-11-04 DIAGNOSIS — Z794 Long term (current) use of insulin: Secondary | ICD-10-CM | POA: Insufficient documentation

## 2023-11-04 DIAGNOSIS — E119 Type 2 diabetes mellitus without complications: Secondary | ICD-10-CM | POA: Insufficient documentation

## 2023-11-04 DIAGNOSIS — C228 Malignant neoplasm of liver, primary, unspecified as to type: Secondary | ICD-10-CM | POA: Insufficient documentation

## 2023-11-04 LAB — CBC WITH DIFFERENTIAL/PLATELET
Abs Immature Granulocytes: 0.02 10*3/uL (ref 0.00–0.07)
Basophils Absolute: 0 10*3/uL (ref 0.0–0.1)
Basophils Relative: 1 %
Eosinophils Absolute: 0.2 10*3/uL (ref 0.0–0.5)
Eosinophils Relative: 4 %
HCT: 24.4 % — ABNORMAL LOW (ref 36.0–46.0)
Hemoglobin: 8.7 g/dL — ABNORMAL LOW (ref 12.0–15.0)
Immature Granulocytes: 0 %
Lymphocytes Relative: 37 %
Lymphs Abs: 1.9 10*3/uL (ref 0.7–4.0)
MCH: 33.1 pg (ref 26.0–34.0)
MCHC: 35.7 g/dL (ref 30.0–36.0)
MCV: 92.8 fL (ref 80.0–100.0)
Monocytes Absolute: 0.8 10*3/uL (ref 0.1–1.0)
Monocytes Relative: 15 %
Neutro Abs: 2.3 10*3/uL (ref 1.7–7.7)
Neutrophils Relative %: 43 %
Platelets: 86 10*3/uL — ABNORMAL LOW (ref 150–400)
RBC: 2.63 MIL/uL — ABNORMAL LOW (ref 3.87–5.11)
RDW: 17.9 % — ABNORMAL HIGH (ref 11.5–15.5)
WBC: 5.2 10*3/uL (ref 4.0–10.5)
nRBC: 0.4 % — ABNORMAL HIGH (ref 0.0–0.2)

## 2023-11-04 LAB — GLUCOSE, CAPILLARY: Glucose-Capillary: 124 mg/dL — ABNORMAL HIGH (ref 70–99)

## 2023-11-04 LAB — COMPREHENSIVE METABOLIC PANEL
ALT: 93 U/L — ABNORMAL HIGH (ref 0–44)
AST: 176 U/L — ABNORMAL HIGH (ref 15–41)
Albumin: 1.8 g/dL — ABNORMAL LOW (ref 3.5–5.0)
Alkaline Phosphatase: 62 U/L (ref 38–126)
Anion gap: 6 (ref 5–15)
BUN: 17 mg/dL (ref 8–23)
CO2: 21 mmol/L — ABNORMAL LOW (ref 22–32)
Calcium: 8 mg/dL — ABNORMAL LOW (ref 8.9–10.3)
Chloride: 105 mmol/L (ref 98–111)
Creatinine, Ser: 2.21 mg/dL — ABNORMAL HIGH (ref 0.44–1.00)
GFR, Estimated: 24 mL/min — ABNORMAL LOW (ref >60)
Glucose, Bld: 125 mg/dL — ABNORMAL HIGH (ref 70–99)
Potassium: 4.8 mmol/L (ref 3.5–5.1)
Sodium: 132 mmol/L — ABNORMAL LOW (ref 135–145)
Total Bilirubin: 3.2 mg/dL — ABNORMAL HIGH (ref 0.0–1.2)
Total Protein: 9.3 g/dL — ABNORMAL HIGH (ref 6.5–8.1)

## 2023-11-04 MED ORDER — SODIUM CHLORIDE 0.9 % IV SOLN: INTRAVENOUS | Status: DC

## 2023-11-04 MED ORDER — SODIUM CHLORIDE 0.9 % IV BOLUS
500.0000 mL | Freq: Once | INTRAVENOUS | Status: AC
  Administered 2023-11-04: 500 mL via INTRAVENOUS

## 2023-11-04 MED ORDER — SODIUM CHLORIDE 0.9 % IV BOLUS
1000.0000 mL | Freq: Once | INTRAVENOUS | Status: AC
  Administered 2023-11-04: 1000 mL via INTRAVENOUS

## 2023-11-04 NOTE — ED Triage Notes (Addendum)
 Patient arrives POV from radiology for hypotension. Patient had scheduled paracentesis today, sent over for systolic BP in 70s. Last BP 88 systolic prior to transport to ED. Patient endorses feeling weak, nausea and vomiting x1 day. Endorses no fever/chills. Patient arrives alert and oriented x4. Hx of HTN, liver cancer and diverticulitis.

## 2023-11-04 NOTE — ED Notes (Signed)
 Patient discharged by this RN. Patient verbalizes understanding of instructions with no additional questions for RN. In wheelchair to lobby with family.

## 2023-11-04 NOTE — Discharge Instructions (Signed)
 Not take your high blood pressure medication until talking to your doctor.  Give them a call today

## 2023-11-04 NOTE — ED Provider Notes (Signed)
 [place] EMERGENCY DEPARTMENT AT [facility] Provider Note   CSN: [id number] Arrival date & time: 11/04/23  1005     History  Chief Complaint  Patient presents with   Hypotension    Dana Thornton is a 68 y.o. female.  68 year old who presents from radiology due to hypotension.  Patient is on blood pressure medication and took her normal doses.  Denies any chest pain or shortness of breath.  Patient was scheduled to have a IR paracentesis.  Denies any recent history of EVAR, severe abdominal pain, vomiting, diarrhea.  Cording to radiology notes, patient has systolic blood pressure in the 70s.  Patient given half liter of saline and transported here.  Notes her weakness has greatly improved at this time.       Home Medications Prior to Admission medications   Medication Sig Start Date End Date Taking? Authorizing Provider  acetaminophen (TYLENOL) 325 MG tablet Take 2 tablets (650 mg total) by mouth every 6 (six) hours as needed for mild pain, moderate pain, fever or headache. Patient not taking: Reported on 10/29/2023 11/17/17   Shon Hale, MD  albuterol (PROVENTIL HFA;VENTOLIN HFA) 108 (90 Base) MCG/ACT inhaler Inhale 2 puffs into the lungs every 4 (four) hours as needed for wheezing or shortness of breath. Patient not taking: Reported on 10/29/2023 11/17/17   Shon Hale, MD  ALEVE 220 MG tablet Take 220-440 mg by mouth 2 (two) times daily as needed (for pain or headaches).    [provider]  amLODipine (NORVASC) 10 MG tablet Take 1 tablet (10 mg total) by mouth daily. 11/17/17   Shon Hale, MD  carvedilol (COREG) 25 MG tablet Take 25 mg by mouth 2 (two) times daily with a meal.    [provider]  ezetimibe (ZETIA) 10 MG tablet Take 10 mg by mouth daily.    [provider]  furosemide (LASIX) 40 MG tablet Take 40 mg by mouth daily as needed for fluid or edema.    [provider]  insulin aspart (NOVOLOG) 100 UNIT/ML  injection Before each meal 3 times a day, 120-150 - 2 units, 151-200 - 4 units, 201-250 - 8 units, 251-300 - 10 units,  301 or above 10 units and call your MD 12/10/11   Leroy Sea, MD  insulin glargine (LANTUS) 100 UNIT/ML injection 30  Units sq Qhs and 12 units q am Patient taking differently: Inject 10 Units into the skin at bedtime as needed (for elevated BGL). 11/17/17   Shon Hale, MD  insulin lispro (HUMALOG KWIKPEN) 100 UNIT/ML KwikPen Inject 10 Units into the skin 3 (three) times daily with meals as needed (AS DIRECTED FOR AN ELEVATED BGL).    [provider]  lactobacillus acidophilus & bulgar (LACTINEX) chewable tablet Chew 1 tablet by mouth 3 (three) times daily with meals. Patient not taking: Reported on 10/29/2023 11/17/17   Shon Hale, MD  levothyroxine (SYNTHROID) 300 MCG tablet Take 150-300 mcg by mouth See admin instructions. Take 150 mcg by mouth 30 minutes before breakfast on Sunday and Saturday and 300 mcg on Mon/Tues/Wed/Thurs/Fri    [provider]  losartan (COZAAR) 100 MG tablet Take 100 mg by mouth daily.    [provider]  mercaptopurine (PURINETHOL) 50 MG tablet Take 50 mg by mouth daily. Give on an empty stomach 1 hour before or 2 hours after meals. Caution: Chemotherapy.    [provider]  ondansetron (ZOFRAN) 4 MG tablet Take 4 mg by mouth every  6 (six) hours as needed for nausea or vomiting.    [provider]  predniSONE (DELTASONE) 10 MG tablet Take 2 tablets (20 mg) daily with food for 4 days, then 1 tablet (10 mg) daily with food until seen by your GI doctor Patient taking differently: Take 10 mg by mouth daily with breakfast. 11/17/17   Shon Hale, MD  Spacer/Aero-Holding Chambers (BREATHERITE COLL SPACER ADULT) MISC 1 Device by Does not apply route every 6 (six) hours as needed. 07/03/17   Rhetta Mura, MD  spironolactone (ALDACTONE) 25 MG tablet Take 25 mg by mouth daily as needed (AS  DIRECTED).    [provider]  triamcinolone cream (KENALOG) 0.1 % Apply 1 Application topically 2 (two) times daily as needed (for irritation, as directed- affected area).    [provider]  TRULICITY 0.75 MG/0.5ML SOAJ Inject 0.75 mg into the skin every Sunday.    [provider]  Vitamin D, Ergocalciferol, (DRISDOL) 50000 units CAPS capsule Take 50,000 Units by mouth every Sunday.    [provider]      Allergies    Ace inhibitors, Codeine, and Statins    Review of Systems   Review of Systems  All other systems reviewed and are negative.   Physical Exam Updated Vital Signs BP (!) 103/59   Pulse 61   Temp (!) 97.5 F (36.4 C) (Oral)   Resp 13   Ht 1.575 m (5\' 2" )   Wt 97 kg   SpO2 100%   BMI 39.11 kg/m  Physical Exam Vitals and nursing note reviewed.  Constitutional:      General: She is not in acute distress.    Appearance: Normal appearance. She is well-developed. She is not toxic-appearing.  HENT:     Head: Normocephalic and atraumatic.  Eyes:     General: Lids are normal.     Conjunctiva/sclera: Conjunctivae normal.     Pupils: Pupils are equal, round, and reactive to light.  Neck:     Thyroid: No thyroid mass.     Trachea: No tracheal deviation.  Cardiovascular:     Rate and Rhythm: Normal rate and regular rhythm.     Heart sounds: Normal heart sounds. No murmur heard.    No gallop.  Pulmonary:     Effort: Pulmonary effort is normal. No respiratory distress.     Breath sounds: Normal breath sounds. No stridor. No decreased breath sounds, wheezing, rhonchi or rales.  Abdominal:     General: There is no distension.     Palpations: Abdomen is soft.     Tenderness: There is no abdominal tenderness. There is no rebound.  Musculoskeletal:        General: No tenderness. Normal range of motion.     Cervical back: Normal range of motion and neck supple.  Skin:    General: Skin is warm and dry.     Findings: No abrasion or  rash.  Neurological:     Mental Status: She is alert and oriented to person, place, and time. Mental status is at baseline.     GCS: GCS eye subscore is 4. GCS verbal subscore is 5. GCS motor subscore is 6.     Cranial Nerves: No cranial nerve deficit.     Sensory: No sensory deficit.     Motor: Motor function is intact.  Psychiatric:        Attention and Perception: Attention normal.        Speech: Speech normal.  Behavior: Behavior normal.     ED Results / Procedures / Treatments   Labs (all labs ordered are listed, but only abnormal results are displayed) Labs Reviewed  CBC WITH DIFFERENTIAL/PLATELET  COMPREHENSIVE METABOLIC PANEL    EKG EKG Interpretation Date/Time:  Monday November 04 2023 10:16:20 EST Ventricular Rate:  55 PR Interval:  180 QRS Duration:  105 QT Interval:  436 QTC Calculation: 417 R Axis:   37  Text Interpretation: Sinus rhythm Low voltage, precordial leads Confirmed by Lorre Nick ([id number]) on 11/04/2023 11:50:53 AM  Radiology No results found.  Procedures Procedures    Medications Ordered in ED Medications  sodium chloride 0.9 % bolus 1,000 mL (has no administration in time range)  0.9 %  sodium chloride infusion (has no administration in time range)    ED Course/ Medical Decision Making/ A&P                                 Medical Decision Making Amount and/or Complexity of Data Reviewed Labs: ordered. ECG/medicine tests: ordered.  Risk Prescription drug management.   Patient's EKG shows sinus rhythm.  Unchanged from prior.  Patient given IV fluids here and feels better.  Labs reviewed and show chronic kidney disease.  Patient was back to her baseline.  Suspect that her blood pressure medication is was causing her current symptoms.  Was instructed to follow with her doctor to have this adjusted and return precautions given        Final Clinical Impression(s) / ED Diagnoses Final diagnoses:  None    Rx / DC  Orders ED Discharge Orders     None         Lorre Nick, MD 11/04/23 1236

## 2023-11-04 NOTE — Progress Notes (Signed)
 Interventional Radiology Brief Note:  Patient presented to [facility] Radiology today for paracentesis in the setting of recently diagnosed with hepatocellular carcinoma.  She has a medical history of DM and HTN for which she is taking amlodipine, carvedilol, and Trulicity. Also recently hospitalized for perforated diverticulitis.  She took the amlodipine and carvedilol this AM. Completed course of amoxicillin yesterday.  On presentation this AM, she is groggy, dizzy, lightheaded while sitting up.  Her BP is 72/54 which is unusual for her.  T 97.7, HR 61. CBG showed blood glucose stable at 124.    Unfortunately unable to complete paracentesis today due to hypotension. She will need emergent evaluation for her symptomatic hypotension.  Hopefully this is a medication adjustment issue due to her recent change in health status, however of note is the recent PET last week showing enlarging diverticular abscess despite PO Amoxcillin for the past 1-2 weeks.   Family made aware.   Called to discussed with ED charge RN. Patient will need to go to lobby.  Will start IV and bolus 500 mL NS prior to transfer.   Loyce Dys, MS RD PA-C

## 2023-11-04 NOTE — Progress Notes (Addendum)
 Pt. To IR for paracentesis as scheduled this AM. Alerted by IR staff that pt. BP low - systolics 60-70's. Pt. Lethargic but A&Ox4. Pt. Recently hospitalized, stated taking two BP medications this AM, feeling nauseous, and has a HA. Pt. HR in 50's and 99% on RA. Lannette Donath, PA notified and arrived to bedside for assessment - see note. Pt. Temp 97.7 oral and BG 125. PA recommended ED with pt. Acceptance. Per PA, 18g PIV placed and NaCl bolus given prior to ED transfer. Pt. BP post bolus 87/51 - pt. Transported to ED triage at 1000 per PA recommendation.

## 2023-11-06 ENCOUNTER — Encounter: Payer: Self-pay | Admitting: *Deleted

## 2023-11-06 ENCOUNTER — Other Ambulatory Visit: Payer: Self-pay

## 2023-11-06 ENCOUNTER — Other Ambulatory Visit: Payer: Self-pay | Admitting: *Deleted

## 2023-11-06 ENCOUNTER — Other Ambulatory Visit (HOSPITAL_COMMUNITY): Payer: Self-pay | Admitting: Oncology

## 2023-11-06 DIAGNOSIS — R188 Other ascites: Secondary | ICD-10-CM

## 2023-11-06 DIAGNOSIS — C220 Liver cell carcinoma: Secondary | ICD-10-CM

## 2023-11-06 NOTE — Progress Notes (Signed)
 PATIENT NAVIGATOR PROGRESS NOTE  Name: Dana Thornton Date: 11/06/2023 MRN: [id number]  DOB: July 06, 1956   Reason for visit:  Referral to [facility]plant   Comments:  Referral faxed to [facility] Liver Transplant team in [place] office location at [phone]    Time spent counseling/coordinating care: 30-45 minutes

## 2023-11-06 NOTE — Progress Notes (Signed)
 The proposed treatment discussed in conference is for discussion purpose only and is not a binding recommendation.  The patients have not been physically examined, or presented with their treatment options.  Therefore, final treatment plans cannot be decided.

## 2023-11-08 ENCOUNTER — Ambulatory Visit (HOSPITAL_COMMUNITY)
Admission: RE | Admit: 2023-11-08 | Discharge: 2023-11-08 | Disposition: A | Discharge status: Home / Self Care | Payer: Medicare Other | Source: Ambulatory Visit | Attending: Oncology | Admitting: Oncology

## 2023-11-08 ENCOUNTER — Other Ambulatory Visit: Payer: Self-pay | Admitting: Oncology

## 2023-11-08 DIAGNOSIS — R188 Other ascites: Secondary | ICD-10-CM | POA: Diagnosis not present

## 2023-11-08 DIAGNOSIS — K7460 Unspecified cirrhosis of liver: Secondary | ICD-10-CM | POA: Insufficient documentation

## 2023-11-08 HISTORY — PX: IR PARACENTESIS: IMG2679

## 2023-11-08 MED ORDER — LIDOCAINE HCL 1 % IJ SOLN
20.0000 mL | Freq: Once | INTRAMUSCULAR | Status: AC
  Administered 2023-11-08: 10 mL

## 2023-11-08 MED ORDER — LIDOCAINE HCL 1 % IJ SOLN
INTRAMUSCULAR | Status: AC
  Filled 2023-11-08: qty 20

## 2023-11-08 NOTE — Procedures (Signed)
 Ultrasound-guided diagnostic and therapeutic paracentesis performed yielding 1.8 liters of bright yellow colored fluid.  Fluid was sent to lab for analysis. No immediate complications. EBL is none.

## 2023-11-11 LAB — CYTOLOGY - NON PAP

## 2023-11-12 ENCOUNTER — Inpatient Hospital Stay: Payer: Medicare Other | Attending: Oncology | Admitting: Oncology

## 2023-11-12 ENCOUNTER — Encounter: Payer: Self-pay | Admitting: Nutrition

## 2023-11-12 ENCOUNTER — Inpatient Hospital Stay: Payer: Medicare Other

## 2023-11-12 ENCOUNTER — Encounter: Payer: Self-pay | Admitting: Oncology

## 2023-11-12 ENCOUNTER — Inpatient Hospital Stay

## 2023-11-12 ENCOUNTER — Other Ambulatory Visit: Payer: Self-pay | Admitting: Oncology

## 2023-11-12 ENCOUNTER — Other Ambulatory Visit: Payer: Self-pay | Admitting: *Deleted

## 2023-11-12 VITALS — BP 115/78 | HR 87 | Temp 97.6°F | Resp 16 | Ht 62.0 in | Wt 210.7 lb

## 2023-11-12 DIAGNOSIS — C7971 Secondary malignant neoplasm of right adrenal gland: Secondary | ICD-10-CM | POA: Diagnosis not present

## 2023-11-12 DIAGNOSIS — D3500 Benign neoplasm of unspecified adrenal gland: Secondary | ICD-10-CM | POA: Insufficient documentation

## 2023-11-12 DIAGNOSIS — C220 Liver cell carcinoma: Secondary | ICD-10-CM | POA: Diagnosis not present

## 2023-11-12 DIAGNOSIS — K8020 Calculus of gallbladder without cholecystitis without obstruction: Secondary | ICD-10-CM | POA: Diagnosis not present

## 2023-11-12 DIAGNOSIS — I700 Atherosclerosis of aorta: Secondary | ICD-10-CM | POA: Diagnosis not present

## 2023-11-12 DIAGNOSIS — I6523 Occlusion and stenosis of bilateral carotid arteries: Secondary | ICD-10-CM | POA: Insufficient documentation

## 2023-11-12 DIAGNOSIS — Z79899 Other long term (current) drug therapy: Secondary | ICD-10-CM | POA: Insufficient documentation

## 2023-11-12 DIAGNOSIS — K5792 Diverticulitis of intestine, part unspecified, without perforation or abscess without bleeding: Secondary | ICD-10-CM | POA: Insufficient documentation

## 2023-11-12 DIAGNOSIS — E785 Hyperlipidemia, unspecified: Secondary | ICD-10-CM | POA: Diagnosis not present

## 2023-11-12 DIAGNOSIS — E119 Type 2 diabetes mellitus without complications: Secondary | ICD-10-CM | POA: Insufficient documentation

## 2023-11-12 DIAGNOSIS — I10 Essential (primary) hypertension: Secondary | ICD-10-CM | POA: Insufficient documentation

## 2023-11-12 DIAGNOSIS — Z888 Allergy status to other drugs, medicaments and biological substances status: Secondary | ICD-10-CM | POA: Diagnosis not present

## 2023-11-12 DIAGNOSIS — Z79620 Long term (current) use of immunosuppressive biologic: Secondary | ICD-10-CM | POA: Diagnosis not present

## 2023-11-12 DIAGNOSIS — K5720 Diverticulitis of large intestine with perforation and abscess without bleeding: Secondary | ICD-10-CM | POA: Diagnosis not present

## 2023-11-12 DIAGNOSIS — K7460 Unspecified cirrhosis of liver: Secondary | ICD-10-CM | POA: Insufficient documentation

## 2023-11-12 DIAGNOSIS — R188 Other ascites: Secondary | ICD-10-CM | POA: Insufficient documentation

## 2023-11-12 DIAGNOSIS — Z5112 Encounter for antineoplastic immunotherapy: Secondary | ICD-10-CM | POA: Insufficient documentation

## 2023-11-12 DIAGNOSIS — K754 Autoimmune hepatitis: Secondary | ICD-10-CM | POA: Diagnosis not present

## 2023-11-12 DIAGNOSIS — Z885 Allergy status to narcotic agent status: Secondary | ICD-10-CM | POA: Diagnosis not present

## 2023-11-12 LAB — BASIC METABOLIC PANEL - CANCER CENTER ONLY
Anion gap: 2 — ABNORMAL LOW (ref 5–15)
BUN: 13 mg/dL (ref 8–23)
CO2: 25 mmol/L (ref 22–32)
Calcium: 8.3 mg/dL — ABNORMAL LOW (ref 8.9–10.3)
Chloride: 104 mmol/L (ref 98–111)
Creatinine: 1.5 mg/dL — ABNORMAL HIGH (ref 0.44–1.00)
GFR, Estimated: 38 mL/min — ABNORMAL LOW (ref >60)
Glucose, Bld: 88 mg/dL (ref 70–99)
Potassium: 4.3 mmol/L (ref 3.5–5.1)
Sodium: 131 mmol/L — ABNORMAL LOW (ref 135–145)

## 2023-11-12 LAB — CBC WITH DIFFERENTIAL (CANCER CENTER ONLY)
Abs Immature Granulocytes: 0.04 10*3/uL (ref 0.00–0.07)
Basophils Absolute: 0.1 10*3/uL (ref 0.0–0.1)
Basophils Relative: 1 %
Eosinophils Absolute: 0.2 10*3/uL (ref 0.0–0.5)
Eosinophils Relative: 2 %
HCT: 27.1 % — ABNORMAL LOW (ref 36.0–46.0)
Hemoglobin: 9.9 g/dL — ABNORMAL LOW (ref 12.0–15.0)
Immature Granulocytes: 1 %
Lymphocytes Relative: 32 %
Lymphs Abs: 2.3 10*3/uL (ref 0.7–4.0)
MCH: 32.9 pg (ref 26.0–34.0)
MCHC: 36.5 g/dL — ABNORMAL HIGH (ref 30.0–36.0)
MCV: 90 fL (ref 80.0–100.0)
Monocytes Absolute: 0.8 10*3/uL (ref 0.1–1.0)
Monocytes Relative: 10 %
Neutro Abs: 4.1 10*3/uL (ref 1.7–7.7)
Neutrophils Relative %: 54 %
Platelet Count: 114 10*3/uL — ABNORMAL LOW (ref 150–400)
RBC: 3.01 MIL/uL — ABNORMAL LOW (ref 3.87–5.11)
RDW: 16.7 % — ABNORMAL HIGH (ref 11.5–15.5)
WBC Count: 7.4 10*3/uL (ref 4.0–10.5)
nRBC: 0 % (ref 0.0–0.2)

## 2023-11-12 MED ORDER — PROCHLORPERAZINE MALEATE 10 MG PO TABS
10.0000 mg | ORAL_TABLET | Freq: Four times a day (QID) | ORAL | 1 refills | Status: DC | PRN
Start: 2023-11-12 — End: 2024-01-12

## 2023-11-12 MED ORDER — METRONIDAZOLE 500 MG PO TABS: 500.0000 mg | ORAL_TABLET | Freq: Three times a day (TID) | ORAL | 0 refills | Status: DC

## 2023-11-12 MED ORDER — ONDANSETRON HCL 8 MG PO TABS: 8.0000 mg | ORAL_TABLET | Freq: Three times a day (TID) | ORAL | 1 refills | Status: DC | PRN

## 2023-11-12 NOTE — Progress Notes (Signed)
 PATIENT NAVIGATOR PROGRESS NOTE  Name: Dana Thornton Date: 11/12/2023 MRN: [id number]  DOB: 1956/08/05   Reason for visit:  Patient education session  Comments:  See education note   Arranged for paracentesis for tomorrow Consult for IR placed for consideration of y90 treatment    Time spent counseling/coordinating care: > 60 minutes

## 2023-11-12 NOTE — Assessment & Plan Note (Addendum)
 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 Confirmed via MRI. Complicated by autoimmune hepatitis.  Her case was presented at GI tumor conference by another provider on 10/16/2023.  It was determined that she is not a candidate for resection.  Plan was to obtain PET/CT following which a referral for liver directed therapy including Y90.  Also radiation oncology referral was to be considered for possible SBRT to adrenal lesion.  PET scan performed on 10/25/2023.  It did not show increased FDG activity in the liver lesion or in the adrenal lesion.  Her case was discussed again in tumor conference on 11/06/2023.  Recommendation made to refer her for possible liver transplant evaluation.  Liver transplantation team at [facility] reviewed her imaging studies and are concerned about clinically picture indicating metastatic disease to adrenal gland.  She was determined to be not a candidate for liver transplant.  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   Plan for systemic treatment with tremelimumab plus durvalumab. Her history of autoimmune hepatitis could complicate immunotherapy.  She was referred to interventional radiology department regarding possibility of Y90 or other liver directed treatments.   We also started pheochromocytoma workup with 24-hour urine catecholamines and metanephrines to determine the exact etiology for adrenal lesion.  If pheochromocytoma is ruled out, she will be referred to radiation oncology for possible SBRT to adrenal lesion, as discussed in tumor conference.  She had education regarding immunotherapy today.  Plan will be to start above mentioned regimen in the next week.  Durvalumab will be continued every 4 weeks starting from cycle 2 onwards.

## 2023-11-12 NOTE — Assessment & Plan Note (Signed)
 Significant ascites likely secondary to liver cirrhosis. Diuretics ineffective. Reports abdominal discomfort and tightness.   She had paracentesis on 11/08/2023 with removal of 1.8 L of ascitic fluid.  Cytology was negative for malignant cells.  - Refer to interventional radiology for repeat abdominal paracentesis

## 2023-11-12 NOTE — Progress Notes (Signed)
 START ON PATHWAY REGIMEN - Hepatobiliary     Cycle 1: A cycle is 28 days:     Tremelimumab-actl      Durvalumab    Cycles 2 and beyond: A cycle is every 28 days:     Durvalumab   **Always confirm dose/schedule in your pharmacy ordering system**  Patient Characteristics: Hepatocellular Carcinoma, Unresectable or Nonsurgical Candidate, Locally Advanced or Metastatic Disease Not Amenable to Locoregional Therapy, Systemic Therapy, First Line, No Prior Transplant and Candidate for Immunotherapy Hepatobiliary Disease Type: Hepatocellular Carcinoma Line of Therapy: First Line Intent of Therapy: Non-Curative / Palliative Intent, Discussed with Patient

## 2023-11-12 NOTE — Progress Notes (Signed)
 [facility]  ONCOLOGY CLINIC PROGRESS NOTE   Patient Care Team: Ollen Bowl, MD as PCP - General (Internal Medicine) Charlott Rakes, MD as Consulting Physician (Gastroenterology) Jerrell Mylar Jacquelyne Balint, RN as Oncology Nurse Navigator  PATIENT NAME: Dana Thornton   MR#: [id number] DOB: 1956/03/20  Date of visit: 11/12/2023   ASSESSMENT & PLAN:   Dana Thornton is a 68 y.o. pleasant lady with a past medical history of autoimmune hepatitis, for which she was seeing gastroenterologist Dr. Bosie Clos, diabetes mellitus, hypertension, dyslipidemia, was referred to our clinic in February 2025 for newly diagnosed hepatocellular carcinoma, noted on MRI abdomen.    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 Confirmed via MRI. Complicated by autoimmune hepatitis.  Her case was presented at GI tumor conference by another provider on 10/16/2023.  It was determined that she is not a candidate for resection.  Plan was to obtain PET/CT following which a referral for liver directed therapy including Y90.  Also radiation oncology referral was to be considered for possible SBRT to adrenal lesion.  PET scan performed on 10/25/2023.  It did not show increased FDG activity in the liver lesion or in the adrenal lesion.  Her case was discussed again in tumor conference on 11/06/2023.  Recommendation made to refer her for possible liver transplant evaluation.  Liver transplantation team at [facility] reviewed her imaging studies and are concerned about clinically picture indicating metastatic disease to adrenal gland.  She was determined to be not a candidate for liver transplant.  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   Plan for systemic treatment with tremelimumab plus durvalumab. Her history of autoimmune hepatitis could complicate immunotherapy.  She was referred to interventional radiology  department regarding possibility of Y90 or other liver directed treatments.   We also started pheochromocytoma workup with 24-hour urine catecholamines and metanephrines to determine the exact etiology for adrenal lesion.  If pheochromocytoma is ruled out, she will be referred to radiation oncology for possible SBRT to adrenal lesion, as discussed in tumor conference.  She had education regarding immunotherapy today.  Plan will be to start above mentioned regimen in the next week.  Durvalumab will be continued every 4 weeks starting from cycle 2 onwards.  Ascites Significant ascites likely secondary to liver cirrhosis. Diuretics ineffective. Reports abdominal discomfort and tightness.   She had paracentesis on 11/08/2023 with removal of 1.8 L of ascitic fluid.  Cytology was negative for malignant cells.  - Refer to interventional radiology for repeat abdominal paracentesis  Diverticulitis -Noted on CT abdomen and pelvis on 10/10/2023.  Diverticulitis with micro perforation. Completed a course of Augmentin. No active infection seen on PET scan, but symptoms suggest ongoing issues. Surgery is not recommended due to cancer diagnosis and previous micro perforation being contained.  - Prescribe Flagyl (metronidazole) three times a day for 14 days.  - Consider follow-up CT scan to assess for resolution of diverticulitis after antibiotic course.    I reviewed lab results and outside records for this visit and discussed relevant results with the patient. Diagnosis, plan of care and treatment options were also discussed in detail with the patient. Opportunity provided to ask questions and answers provided to her apparent satisfaction. Provided instructions to call our clinic with any problems, questions or concerns prior to return visit. I recommended to continue follow-up with PCP and sub-specialists. She verbalized understanding and agreed with the plan.  NCCN guidelines have been consulted in the  planning of this patient's care.  I spent a total of 40 minutes during this encounter with the patient including review of chart and various tests results, discussions about plan of care and coordination of care plan.   [name], MD  11/12/2023 4:50 PM  [facility] - A DEPT OF [facility] [address] [place] [address] Dept: [phone] Dept Fax: [phone]    CHIEF COMPLAINT/ REASON FOR VISIT:   Hepatocellular carcinoma with possible metastatic disease to right adrenal gland.  Current Treatment: Plan for systemic treatment with tremelimumab plus durvalumab.  Also referred for liver directed therapy with Y90.  INTERVAL HISTORY:    Discussed the use of AI scribe software for clinical note transcription with the patient, who gave verbal consent to proceed.   Dana Thornton is here today for repeat clinical assessment.   She reports abdominal distention due to fluid accumulation. The patient reports no pain but feels bloated. The patient has completed a course of antibiotics for diverticulitis. The patient's case has been discussed in a conference and liver transplant has been ruled out due to the adrenal gland spot.  She denies any nausea, vomiting.  Tolerating oral intake well.  Denies any fevers or chills.  I have reviewed the past medical history, past surgical history, social history and family history with the patient and they are unchanged from previous note.  HISTORY OF PRESENT ILLNESS:   Oncology History  Hepatocellular carcinoma (HCC)  10/29/2023 Initial Diagnosis   Hepatocellular carcinoma (HCC)   10/29/2023 Cancer Staging   Staging form: Liver, AJCC 8th Edition - Clinical: Stage IVB (cT1b, cN0, cM1) - Signed by [name], MD on 10/29/2023 Stage prefix: Initial diagnosis   11/19/2023 -  Chemotherapy   Patient is on Treatment Plan : Hepatocellular Carcinoma Tremelimumab-actl C1  D1 + Durvalumab q28d          REVIEW OF SYSTEMS:   Review of Systems - Oncology  All other pertinent systems were reviewed with the patient and are negative.  ALLERGIES: She is allergic to ace inhibitors, codeine, and statins.  MEDICATIONS:  Current Outpatient Medications  Medication Sig Dispense Refill   amLODipine (NORVASC) 10 MG tablet Take 1 tablet (10 mg total) by mouth daily. 30 tablet 1   carvedilol (COREG) 25 MG tablet Take 25 mg by mouth 2 (two) times daily with a meal.     ezetimibe (ZETIA) 10 MG tablet Take 10 mg by mouth daily.     furosemide (LASIX) 40 MG tablet Take 40 mg by mouth daily as needed for fluid or edema.     insulin lispro (HUMALOG KWIKPEN) 100 UNIT/ML KwikPen Inject 10 Units into the skin 3 (three) times daily with meals as needed (AS DIRECTED FOR AN ELEVATED BGL).     levothyroxine (SYNTHROID) 300 MCG tablet Take 150-300 mcg by mouth See admin instructions. Take 150 mcg by mouth 30 minutes before breakfast on Sunday and Saturday and 300 mcg on Mon/Tues/Wed/Thurs/Fri     losartan (COZAAR) 100 MG tablet Take 100 mg by mouth daily.     mercaptopurine (PURINETHOL) 50 MG tablet Take 50 mg by mouth daily. Give on an empty stomach 1 hour before or 2 hours after meals. Caution: Chemotherapy.     metroNIDAZOLE (FLAGYL) 500 MG tablet Take 1 tablet (500 mg total) by mouth 3 (three) times daily. 42 tablet 0   ondansetron (ZOFRAN) 4  MG tablet Take 4 mg by mouth every 6 (six) hours as needed for nausea or vomiting.     predniSONE (DELTASONE) 10 MG tablet Take 2 tablets (20 mg) daily with food for 4 days, then 1 tablet (10 mg) daily with food until seen by your GI doctor (Patient taking differently: Take 10 mg by mouth daily with breakfast.) 30 tablet 1   spironolactone (ALDACTONE) 25 MG tablet Take 25 mg by mouth daily as needed (AS DIRECTED).     spironolactone (ALDACTONE) 50 MG tablet Take 50 mg by mouth daily.     triamcinolone cream (KENALOG) 0.1 % Apply 1 Application  topically 2 (two) times daily as needed (for irritation, as directed- affected area).     TRULICITY 0.75 MG/0.5ML SOAJ Inject 0.75 mg into the skin every Sunday.     Vitamin D, Ergocalciferol, (DRISDOL) 50000 units CAPS capsule Take 50,000 Units by mouth every Sunday.     ALEVE 220 MG tablet Take 220-440 mg by mouth 2 (two) times daily as needed (for pain or headaches).     insulin glargine (LANTUS) 100 UNIT/ML injection 30  Units sq Qhs and 12 units q am (Patient not taking: Reported on 11/12/2023) 10 mL 2   ondansetron (ZOFRAN) 8 MG tablet Take 1 tablet (8 mg total) by mouth every 8 (eight) hours as needed for nausea or vomiting. 30 tablet 1   prochlorperazine (COMPAZINE) 10 MG tablet Take 1 tablet (10 mg total) by mouth every 6 (six) hours as needed for nausea or vomiting. 30 tablet 1   No current facility-administered medications for this visit.     VITALS:   Blood pressure 115/78, pulse 87, temperature 97.6 F (36.4 C), temperature source Temporal, resp. rate 16, height 5\' 2"  (1.575 m), weight 210 lb 11.2 oz (95.6 kg), SpO2 100%.  Wt Readings from Last 3 Encounters:  11/12/23 210 lb 11.2 oz (95.6 kg)  11/04/23 213 lb 13.5 oz (97 kg)  10/14/23 214 lb 1.6 oz (97.1 kg)    Body mass index is 38.54 kg/m.    Onc Performance Status - 11/12/23 0829       ECOG Perf Status   ECOG Perf Status Restricted in physically strenuous activity but ambulatory and able to carry out work of a light or sedentary nature, e.g., light house work, office work      KPS SCALE   KPS % SCORE Normal activity with effort, some s/s of disease             PHYSICAL EXAM:   Physical Exam Constitutional:      General: She is not in acute distress.    Appearance: Normal appearance.  HENT:     Head: Normocephalic and atraumatic.  Eyes:     General: No scleral icterus.    Conjunctiva/sclera: Conjunctivae normal.  Cardiovascular:     Rate and Rhythm: Normal rate and regular rhythm.     Heart sounds:  Normal heart sounds.  Pulmonary:     Effort: Pulmonary effort is normal.     Breath sounds: Normal breath sounds.  Abdominal:     General: There is distension.  Musculoskeletal:     Right lower leg: No edema.     Left lower leg: No edema.  Neurological:     General: No focal deficit present.     Mental Status: She is alert and oriented to person, place, and time.  Psychiatric:        Mood and Affect: Mood normal.  Behavior: Behavior normal.        Thought Content: Thought content normal.       LABORATORY DATA:   I have reviewed the data as listed.  Results for orders placed or performed in visit on 11/12/23  CBC with Differential (Cancer Center Only)  Result Value Ref Range   WBC Count 7.4 4.0 - 10.5 K/uL   RBC 3.01 (L) 3.87 - 5.11 MIL/uL   Hemoglobin 9.9 (L) 12.0 - 15.0 g/dL   HCT 16.1 (L) 09.6 - 04.5 %   MCV 90.0 80.0 - 100.0 fL   MCH 32.9 26.0 - 34.0 pg   MCHC 36.5 (H) 30.0 - 36.0 g/dL   RDW 40.9 (H) 81.1 - 91.4 %   Platelet Count 114 (L) 150 - 400 K/uL   nRBC 0.0 0.0 - 0.2 %   Neutrophils Relative % 54 %   Neutro Abs 4.1 1.7 - 7.7 K/uL   Lymphocytes Relative 32 %   Lymphs Abs 2.3 0.7 - 4.0 K/uL   Monocytes Relative 10 %   Monocytes Absolute 0.8 0.1 - 1.0 K/uL   Eosinophils Relative 2 %   Eosinophils Absolute 0.2 0.0 - 0.5 K/uL   Basophils Relative 1 %   Basophils Absolute 0.1 0.0 - 0.1 K/uL   Immature Granulocytes 1 %   Abs Immature Granulocytes 0.04 0.00 - 0.07 K/uL  Basic Metabolic Panel - Cancer Center Only  Result Value Ref Range   Sodium 131 (L) 135 - 145 mmol/L   Potassium 4.3 3.5 - 5.1 mmol/L   Chloride 104 98 - 111 mmol/L   CO2 25 22 - 32 mmol/L   Glucose, Bld 88 70 - 99 mg/dL   BUN 13 8 - 23 mg/dL   Creatinine 7.82 (H) 9.56 - 1.00 mg/dL   Calcium 8.3 (L) 8.9 - 10.3 mg/dL   GFR, Estimated 38 (L) >60 mL/min   Anion gap 2 (L) 5 - 15    RADIOGRAPHIC STUDIES:  I have personally reviewed the radiological images as listed and agree with the  findings in the report.  IR Paracentesis Result Date: 11/08/2023 INDICATION: 68 year old female. History of hepatocellular carcinoma request is for therapeutic and diagnostic paracentesis EXAM: ULTRASOUND GUIDED LEFT-SIDED THERAPEUTIC AND DIAGNOSTIC PARACENTESIS MEDICATIONS: Lidocaine 1% 10 mL COMPLICATIONS: None immediate. PROCEDURE: Informed written consent was obtained from the patient after a discussion of the risks, benefits and alternatives to treatment. A timeout was performed prior to the initiation of the procedure. Initial ultrasound scanning demonstrates a small amount of ascites within the left lower abdominal quadrant. The left lower abdomen was prepped and draped in the usual sterile fashion. 1% lidocaine was used for local anesthesia. Following this, a 19 gauge, 10-cm, Yueh catheter was introduced. An ultrasound image was saved for documentation purposes. The paracentesis was performed. The catheter was removed and a dressing was applied. The patient tolerated the procedure well without immediate post procedural complication. FINDINGS: A total of approximately 1.8 L of straw-colored fluid was removed. Samples were sent to the laboratory as requested by the clinical team. IMPRESSION: Successful ultrasound-guided therapeutic and diagnostic paracentesis yielding 1.8 liters of peritoneal fluid. Performed by Anders Grant NP Electronically Signed   By: Corlis Leak M.D.   On: 11/08/2023 15:30   IR ABDOMEN US LIMITED Result Date: 11/04/2023 CLINICAL DATA:  68 year old female with history of newly diagnosed hepatocellular carcinoma with ascites. Request for paracentesis. EXAM: LIMITED ABDOMEN ULTRASOUND FOR ASCITES TECHNIQUE: Limited ultrasound survey for ascites was performed in  anticipation of procedure. COMPARISON:  None Available. FINDINGS: Small-moderate volume ascites present. Pre-procedure assessment found patient to be hypotensive with dizziness and lightheadedness. IMPRESSION:  Small-moderate volume ascites present. Procedure was not performed due to hypotension. Performed by: Loyce Dys PA-C Electronically Signed   By: Richarda Overlie M.D.   On: 11/04/2023 15:10   NM PET Image Initial (PI) Skull Base To Thigh Result Date: 10/30/2023 CLINICAL DATA:  Initial treatment strategy for hepatocellular carcinoma. Metastatic disease evaluation. EXAM: NUCLEAR MEDICINE PET SKULL BASE TO THIGH TECHNIQUE: 10.6 mCi F-18 FDG was injected intravenously. Full-ring PET imaging was performed from the skull base to thigh after the radiotracer. CT data was obtained and used for attenuation correction and anatomic localization. Fasting blood glucose: 88 mg/dl COMPARISON:  Chest CT 16/06/9603, abdominal MRI 10/13/2023 and abdominopelvic CT 10/10/2023. FINDINGS: Mediastinal blood pool activity: SUV max 2.9 Liver activity: SUV max 4.3 NECK: No hypermetabolic cervical lymph nodes are identified. No suspicious activity identified within the pharyngeal mucosal space. Incidental CT findings: Bilateral carotid atherosclerosis. CHEST: There are no hypermetabolic mediastinal, hilar or axillary lymph nodes. No hypermetabolic pulmonary activity or suspicious nodularity. Incidental CT findings: Pulmonary evaluation limited by breathing artifact. Grossly stable atelectasis at both lung bases. Atherosclerosis of the aorta, great vessels and coronary arteries. ABDOMEN/PELVIS: The known lesion inferiorly in the right hepatic lobe involving segments V and VI measures 4.5 x 4.2 cm on image 101/4. This has metabolic activity similar to background liver (SUV max 4.6). No hypermetabolic liver lesions are demonstrated. There is no significant hypermetabolic activity within the recently demonstrated heterogeneously enhancing right adrenal mass. This mass measures approximately 4.0 x 2.1 cm on image 94/4 and has an SUV max of 6.8. No hypermetabolic activity within the left adrenal gland, spleen or pancreas. There is no hypermetabolic  nodal activity in the abdomen or pelvis. There is focal hypermetabolic activity at the site of the perforated sigmoid diverticulitis on recent abdominopelvic CT. This has an SUV max of 9.1. Adjacent collection of fluid and air has enlarged from the previous CT and become more well-defined, measuring 3.1 x 2.9 cm on image 151/4. Incidental CT findings: Underlying morphologic changes of cirrhosis. Multiple calcified gallstones. Multiple renal cystic lesions, better characterized on recent MRI. A moderate amount of ascites has increased in volume compared with the previous abdominal CT. Aside from the focal pericolonic fluid collection described above, no other focal fluid collections or pneumoperitoneum demonstrated. There is increased edema throughout the subcutaneous fat of the anterior abdominal wall. SKELETON: There is no hypermetabolic activity to suggest osseous metastatic disease. 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 IMPRESSION: 1. The known lesion inferiorly in the right hepatic lobe has metabolic activity similar to background liver. This is not further characterized by this examination, although remains suspicious for hepatocellular carcinoma based on prior CT and MR appearance. 2. No evidence of hypermetabolic metastatic disease. The lack of significant hypermetabolic activity within the primary lesion results in limited sensitivity of PET-CT for detection of metastatic disease. 3. The recently demonstrated heterogeneously enhancing right adrenal mass has no significant hypermetabolic activity. This could still reflect metastatic disease or a pheochromocytoma. Correlate clinically. 4. Focal hypermetabolic activity at the site of perforated sigmoid diverticulitis on recent abdominopelvic CT, consistent with residual inflammation. Adjacent collection of fluid and air has enlarged from the previous CT and become more well-defined,  suspicious for a developing abscess. 5. Increased ascites and subcutaneous edema throughout the anterior abdominal wall, likely due to  the patient's cirrhosis and/or fluid overload. 6.  Aortic Atherosclerosis (ICD10-I70.0). Electronically Signed   By: Carey Bullocks M.D.   On: 10/30/2023 15:09   CT CHEST WO CONTRAST Result Date: 10/14/2023 CLINICAL DATA:  Hepatocellular carcinoma staging. EXAM: CT CHEST WITHOUT CONTRAST TECHNIQUE: Multidetector CT imaging of the chest was performed following the standard protocol without IV contrast. RADIATION DOSE REDUCTION: This exam was performed according to the departmental dose-optimization program which includes automated exposure control, adjustment of the mA and/or kV according to patient size and/or use of iterative reconstruction technique. COMPARISON:  Abdominal MRI dated 10/13/2023. FINDINGS: Evaluation of this exam is limited in the absence of intravenous contrast as well as due to respiratory motion. Cardiovascular: There is no cardiomegaly or pericardial effusion. There is coronary vascular calcification. Mild atherosclerotic calcification of the thoracic aorta. No aneurysmal dilatation. The central pulmonary arteries are grossly unremarkable. Mediastinum/Nodes: No obvious hilar or mediastinal adenopathy. Evaluation however is limited due to factors above. The esophagus is grossly unremarkable no mediastinal fluid collection. Lungs/Pleura: There are bibasilar dependent atelectasis. No focal consolidation, pleural effusion, or pneumothorax. The central airways are patent. Upper Abdomen: Cirrhosis and ascites. Partially visualized heterogeneous mass in the right lobe of the liver is not characterized on this CT. Musculoskeletal: No acute osseous pathology. IMPRESSION: 1. No acute intrathoracic pathology. No evidence of metastatic disease in the chest. 2. Cirrhosis and ascites. Partially visualized heterogeneous mass in the right lobe of the liver. 3. Aortic  Atherosclerosis (ICD10-I70.0). Electronically Signed   By: Elgie Collard M.D.   On: 10/14/2023 14:08    CODE STATUS:  Code Status History     Date Active Date Inactive Code Status Order ID Comments User Context   10/10/2023 1942 10/15/2023 1540 Full Code [id number]  Charlsie Quest, MD ED   11/12/2017 1638 11/17/2017 1853 Full Code [id number]  Esperanza Sheets, MD ED   06/27/2017 1007 07/03/2017 1735 Full Code [id number]  Bernadene Person, NP ED   04/09/2015 2033 04/13/2015 1554 Full Code [id number]  Osvaldo Shipper, MD Inpatient   03/29/2015 0300 04/01/2015 1411 Full Code [id number]  Delano Metz, MD Inpatient   12/03/2011 0340 12/10/2011 1759 Full Code [id number]  Deterding, Dorise Hiss, MD Inpatient   12/03/2011 0234 12/03/2011 0340 Full Code [id number]  Siqueiros Guinevere Ferrari, MD ED    Questions for Most Recent Historical Code Status (Order [id number])     Question Answer   By: Consent: discussion documented in EHR                Advance Directive Documentation    Flowsheet Row Most Recent Value  Type of Advance Directive Healthcare Power of Attorney, Living will  Pre-existing out of facility DNR order (yellow form or pink MOST form) --  "MOST" Form in Place? --       Orders Placed This Encounter  Procedures   Consent Attestation for Oncology Treatment    The patient is informed of risks, benefits, side-effects of the prescribed oncology treatment. Potential short term and long term side effects and response rates discussed. After a long discussion, the patient made informed decision to proceed.:   Yes   IR Paracentesis    Standing Status:   Future    Expected Date:   11/13/2023    Expiration Date:   11/11/2024    If therapeutic, is there a maximum amount of fluid to be removed?:   No    Are labs required for specimen collection?:   No  Is Albumin medication needed?:   No    Reason for Exam (SYMPTOM  OR DIAGNOSIS REQUIRED):   HCC ascites    Preferred Imaging Location?:   [facility]   24 Hr Urine, Metanephrine    Standing Status:   Future    Expiration Date:   11/11/2024   Catecholamines,Ur.,Free,24 Hh    Standing Status:   Future    Expiration Date:   11/11/2024   CBC with Differential (Cancer Center Only)    Standing Status:   Future    Expected Date:   11/19/2023    Expiration Date:   11/18/2024   CMP (Cancer Center only)    Standing Status:   Future    Expected Date:   11/19/2023    Expiration Date:   11/18/2024   T4    Standing Status:   Future    Expected Date:   11/19/2023    Expiration Date:   11/18/2024   TSH    Standing Status:   Future    Expected Date:   11/19/2023    Expiration Date:   11/18/2024   CBC with Differential (Cancer Center Only)    Standing Status:   Future    Expected Date:   12/17/2023    Expiration Date:   12/16/2024   CMP (Cancer Center only)    Standing Status:   Future    Expected Date:   12/17/2023    Expiration Date:   12/16/2024   CBC with Differential (Cancer Center Only)    Standing Status:   Future    Expected Date:   01/14/2024    Expiration Date:   01/13/2025   CMP (Cancer Center only)    Standing Status:   Future    Expected Date:   01/14/2024    Expiration Date:   01/13/2025   T4    Standing Status:   Future    Expected Date:   01/14/2024    Expiration Date:   01/13/2025   TSH    Standing Status:   Future    Expected Date:   01/14/2024    Expiration Date:   01/13/2025   CBC with Differential (Cancer Center Only)    Standing Status:   Future    Expected Date:   02/11/2024    Expiration Date:   02/10/2025   CMP (Cancer Center only)    Standing Status:   Future    Expected Date:   02/11/2024    Expiration Date:   02/10/2025   PHYSICIAN COMMUNICATION ORDER    Thyroid function tests at baseline and every 3rd cycle   ONCBCN PHYSICIAN COMMUNICATION 6    Tremelimumab-actl: this agent may cause ocular inflammatory toxicity. If signs/symptoms of ocular toxicity occur, urgent referral to ophthalmology is indicated. Corticosteroid eye  drops should be initiated in symptomatic patients. Review drug package insert for specific recommendations.     Future Appointments  Date Time Provider Department Center  11/13/2023  2:00 PM [facility] 4 [facility]  11/19/2023  8:30 AM [facility] INFUSION [facility] None  11/26/2023  8:00 AM [facility] DIAG TOMO 1 [facility]  11/26/2023  8:10 AM [facility] [place] 1 [facility]  12/17/2023 10:15 AM [facility] PHLEBOTOMIST [facility] None  12/17/2023 10:30 AM Laurens Matheny, MD [facility] None  12/17/2023 11:45 AM [facility] INFUSION [facility] None     This document was completed utilizing speech recognition software. Grammatical errors, random word insertions, pronoun errors, and incomplete sentences are an occasional consequence of  this system due to software limitations, ambient noise, and hardware issues. Any formal questions or concerns about the content, text or information contained within the body of this dictation should be directly addressed to the provider for clarification.

## 2023-11-12 NOTE — Assessment & Plan Note (Signed)
-  Noted on CT abdomen and pelvis on 10/10/2023.  Diverticulitis with micro perforation. Completed a course of Augmentin. No active infection seen on PET scan, but symptoms suggest ongoing issues. Surgery is not recommended due to cancer diagnosis and previous micro perforation being contained.  - Prescribe Flagyl (metronidazole) three times a day for 14 days.  - Consider follow-up CT scan to assess for resolution of diverticulitis after antibiotic course.

## 2023-11-13 ENCOUNTER — Encounter: Payer: Self-pay | Admitting: Oncology

## 2023-11-13 ENCOUNTER — Other Ambulatory Visit: Payer: Self-pay

## 2023-11-13 ENCOUNTER — Ambulatory Visit (HOSPITAL_COMMUNITY)
Admission: RE | Admit: 2023-11-13 | Discharge: 2023-11-13 | Disposition: A | Discharge status: Home / Self Care | Source: Ambulatory Visit | Attending: Oncology | Admitting: Oncology

## 2023-11-13 ENCOUNTER — Other Ambulatory Visit: Payer: Self-pay | Admitting: Oncology

## 2023-11-13 DIAGNOSIS — C220 Liver cell carcinoma: Secondary | ICD-10-CM | POA: Diagnosis present

## 2023-11-13 DIAGNOSIS — R188 Other ascites: Secondary | ICD-10-CM | POA: Insufficient documentation

## 2023-11-13 HISTORY — PX: IR PARACENTESIS: IMG2679

## 2023-11-13 LAB — AFP TUMOR MARKER: AFP, Serum, Tumor Marker: 937 ng/mL — ABNORMAL HIGH (ref 0.0–9.2)

## 2023-11-13 MED ORDER — LIDOCAINE HCL 1 % IJ SOLN
20.0000 mL | Freq: Once | INTRAMUSCULAR | Status: AC
Start: 2023-11-13 — End: 2023-11-13
  Administered 2023-11-13: 20 mL

## 2023-11-13 MED ORDER — LIDOCAINE HCL 1 % IJ SOLN
INTRAMUSCULAR | Status: AC
  Filled 2023-11-13: qty 20

## 2023-11-13 NOTE — Procedures (Signed)
 PROCEDURE SUMMARY:  Successful US guided paracentesis from right lower quadrant.  Yielded 1.7 L of clear yellow fluid.  No immediate complications.  Pt tolerated well.   Specimen not sent for labs.  EBL < 2 mL  Mickie Kay, NP 11/13/2023 4:04 PM

## 2023-11-13 NOTE — Progress Notes (Signed)
 On 11/12/2023, her urine was submitted for cytology by error.  We do not need cytology on her urine specimen and hence we will cancel this order.

## 2023-11-16 ENCOUNTER — Other Ambulatory Visit: Payer: Self-pay

## 2023-11-19 ENCOUNTER — Inpatient Hospital Stay

## 2023-11-19 VITALS — BP 107/70 | HR 78 | Temp 98.0°F | Resp 16 | Ht 62.0 in | Wt 199.1 lb

## 2023-11-19 DIAGNOSIS — Z5112 Encounter for antineoplastic immunotherapy: Secondary | ICD-10-CM | POA: Diagnosis not present

## 2023-11-19 DIAGNOSIS — C220 Liver cell carcinoma: Secondary | ICD-10-CM

## 2023-11-19 DIAGNOSIS — C7971 Secondary malignant neoplasm of right adrenal gland: Secondary | ICD-10-CM

## 2023-11-19 MED ORDER — SODIUM CHLORIDE 0.9 % IV SOLN
INTRAVENOUS | Status: DC
Start: 2023-11-19 — End: 2023-11-19

## 2023-11-19 MED ORDER — SODIUM CHLORIDE 0.9 % IV SOLN
1500.0000 mg | Freq: Once | INTRAVENOUS | Status: AC
  Administered 2023-11-19: 1500 mg via INTRAVENOUS
  Filled 2023-11-19: qty 30

## 2023-11-19 MED ORDER — TREMELIMUMAB-ACTL CHEMO INJECTION 25 MG/1.25ML
300.0000 mg | Freq: Once | INTRAVENOUS | Status: DC
  Filled 2023-11-19: qty 15

## 2023-11-19 NOTE — Patient Instructions (Signed)
 [facility]   Discharge Instructions: Thank you for choosing [facility] to provide your oncology and hematology care.   If you have a lab appointment with the Cancer Center, please go directly to the Cancer Center and check in at the registration area.   Wear comfortable clothing and clothing appropriate for easy access to any Portacath or PICC line.   We strive to give you quality time with your provider. You may need to reschedule your appointment if you arrive late (15 or more minutes).  Arriving late affects you and other patients whose appointments are after yours.  Also, if you miss three or more appointments without notifying the office, you may be dismissed from the clinic at the provider's discretion.      For prescription refill requests, have your pharmacy contact our office and allow 72 hours for refills to be completed.    Today you received the following chemotherapy and/or immunotherapy agents IMJUDO/IMFINZI      To help prevent nausea and vomiting after your treatment, we encourage you to take your nausea medication as directed.  BELOW ARE SYMPTOMS THAT SHOULD BE REPORTED IMMEDIATELY: *FEVER GREATER THAN 100.4 F (38 C) OR HIGHER *CHILLS OR SWEATING *NAUSEA AND VOMITING THAT IS NOT CONTROLLED WITH YOUR NAUSEA MEDICATION *UNUSUAL SHORTNESS OF BREATH *UNUSUAL BRUISING OR BLEEDING *URINARY PROBLEMS (pain or burning when urinating, or frequent urination) *BOWEL PROBLEMS (unusual diarrhea, constipation, pain near the anus) TENDERNESS IN MOUTH AND THROAT WITH OR WITHOUT PRESENCE OF ULCERS (sore throat, sores in mouth, or a toothache) UNUSUAL RASH, SWELLING OR PAIN  UNUSUAL VAGINAL DISCHARGE OR ITCHING   Items with * indicate a potential emergency and should be followed up as soon as possible or go to the Emergency Department if any problems should occur.  Please show the CHEMOTHERAPY ALERT CARD or  IMMUNOTHERAPY ALERT CARD at check-in to the Emergency Department and triage nurse.  Should you have questions after your visit or need to cancel or reschedule your appointment, please contact [facility]  Dept: [phone]  and follow the prompts.  Office hours are 8:00 a.m. to 4:30 p.m. Monday - Friday. Please note that voicemails left after 4:00 p.m. may not be returned until the following business day.  We are closed weekends and major holidays. You have access to a nurse at all times for urgent questions. Please call the main number to the clinic Dept: [phone] and follow the prompts.   For any non-urgent questions, you may also contact your provider using MyChart. We now offer e-Visits for anyone 82 and older to request care online for non-urgent symptoms. For details visit [email or web address].   Also download the MyChart app! Go to the app store, search "MyChart", open the app, select [facility], and log in with your MyChart username and password.  Tremelimumab Injection What is this medication? TREMELIMUMAB (tre mel IM ue mab) treats liver cancer and lung cancer. It works by helping your immune system slow or stop the spread of cancer cells. It is a monoclonal antibody. This medicine may be used for other purposes; ask your health care provider or pharmacist if you have questions. COMMON BRAND NAME(S): IMJUDO What should I tell my care team before I take this medication? They need to know if you have any of these conditions: Autoimmune conditions, such as Crohn disease, ulcerative colitis, lupus Nervous system  conditions, such as Guillain-Barre syndrome or myasthenia gravis An unusual or allergic reaction to tremelimumab, other medications, foods, dyes, or preservatives Pregnant or trying to get pregnant Breastfeeding How should I use this medication? This medication is infused into a vein. It is given by your care team in  a hospital or clinic setting. A special MedGuide will be given to you before each treatment. Be sure to read this information carefully each time. Talk to your care team about the use of this medication in children. Special care may be needed. Overdosage: If you think you have taken too much of this medicine contact a poison control center or emergency room at once. NOTE: This medicine is only for you. Do not share this medicine with others. What if I miss a dose? Keep appointments for follow-up doses. It is important not to miss your dose. Call your care team if you are unable to keep an appointment. What may interact with this medication? Interactions have not been studied. This list may not describe all possible interactions. Give your health care provider a list of all the medicines, herbs, non-prescription drugs, or dietary supplements you use. Also tell them if you smoke, drink alcohol, or use illegal drugs. Some items may interact with your medicine. What should I watch for while using this medication? Your condition will be monitored carefully while you are receiving this medication. You may need blood work done while you are taking this medication. This medication may cause serious skin reactions. They can happen weeks to months after starting the medication. Contact your care team right away if you notice fevers or flu-like symptoms with a rash. The rash may be red or purple and then turn into blisters or peeling of the skin. You may also notice a red rash with swelling of the face, lips, or lymph nodes in your neck or under your arms. Tell your care team right away if you have any change in your eyesight. Talk to your care team if you may be pregnant. Serious birth defects can occur if you take this medication during pregnancy and for 3 months after the last dose. You will need a negative pregnancy test before starting this medication. Contraception is recommended while taking this  medication and for 3 months after the last dose. Your care team can help you find the option that works for you. Do not breastfeed while taking this medication and for 3 months after the last dose. What side effects may I notice from receiving this medication? Side effects that you should report to your care team as soon as possible: Allergic reactions--skin rash, itching, hives, swelling of the face, lips, tongue, or throat Dry cough, shortness of breath or trouble breathing Eye pain, redness, irritation, or discharge with blurry or decreased vision Heart muscle inflammation--unusual weakness or fatigue, shortness of breath, chest pain, fast or irregular heartbeat, dizziness, swelling of the ankles, feet, or hands Hormone gland problems--headache, sensitivity to light, unusual weakness or fatigue, dizziness, fast or irregular heartbeat, increased sensitivity to cold or heat, excessive sweating, constipation, hair loss, increased thirst or amount of urine, tremors or shaking, irritability Infusion reactions--chest pain, shortness of breath or trouble breathing, feeling faint or lightheaded Kidney injury (glomerulonephritis)--decrease in the amount of urine, red or dark brown urine, foamy or bubbly urine, swelling of the ankles, hands, or feet Liver injury--right upper belly pain, loss of appetite, nausea, light-colored stool, dark yellow or brown urine, yellowing skin or eyes, unusual weakness or fatigue Pain, tingling,  or numbness in the hands or feet, muscle weakness, change in vision, confusion or trouble speaking, loss of balance or coordination, trouble walking, seizures Pancreatitis--severe stomach pain that spreads to your back or gets worse after eating or when touched, fever, nausea, vomiting Rash, fever, and swollen lymph nodes Redness, blistering, peeling, or loosening of the skin, including inside the mouth Stomach pain that is severe, does not go away, or gets worse Sudden or severe  stomach pain, bloody diarrhea, fever, nausea, vomiting Side effects that usually do not require medical attention (report these to your care team if they continue or are bothersome): Bone, joint, or muscle pain Diarrhea Fatigue Loss of appetite Nausea Skin rash This list may not describe all possible side effects. Call your doctor for medical advice about side effects. You may report side effects to FDA at 1-800-FDA-1088. Where should I keep my medication? This medication is given in a hospital or clinic. It will not be stored at home. NOTE: This sheet is a summary. It may not cover all possible information. If you have questions about this medicine, talk to your doctor, pharmacist, or health care provider.  2024 Elsevier/Gold Standard (2022-10-15 00:00:00)  Durvalumab Injection What is this medication? DURVALUMAB (dur VAL ue mab) treats some types of cancer. It works by helping your immune system slow or stop the spread of cancer cells. It is a monoclonal antibody. This medicine may be used for other purposes; ask your health care provider or pharmacist if you have questions. COMMON BRAND NAME(S): IMFINZI What should I tell my care team before I take this medication? They need to know if you have any of these conditions: Allogeneic stem cell transplant (uses someone else's stem cells) Autoimmune diseases, such as Crohn disease, ulcerative colitis, lupus History of chest radiation Nervous system problems, such as Guillain-Barre syndrome, myasthenia gravis Organ transplant An unusual or allergic reaction to durvalumab, other medications, foods, dyes, or preservatives Pregnant or trying to get pregnant Breast-feeding How should I use this medication? This medication is infused into a vein. It is given by your care team in a hospital or clinic setting. A special MedGuide will be given to you before each treatment. Be sure to read this information carefully each time. Talk to your care  team about the use of this medication in children. Special care may be needed. Overdosage: If you think you have taken too much of this medicine contact a poison control center or emergency room at once. NOTE: This medicine is only for you. Do not share this medicine with others. What if I miss a dose? Keep appointments for follow-up doses. It is important not to miss your dose. Call your care team if you are unable to keep an appointment. What may interact with this medication? Interactions have not been studied. This list may not describe all possible interactions. Give your health care provider a list of all the medicines, herbs, non-prescription drugs, or dietary supplements you use. Also tell them if you smoke, drink alcohol, or use illegal drugs. Some items may interact with your medicine. What should I watch for while using this medication? Your condition will be monitored carefully while you are receiving this medication. You may need blood work while taking this medication. This medication may cause serious skin reactions. They can happen weeks to months after starting the medication. Contact your care team right away if you notice fevers or flu-like symptoms with a rash. The rash may be red or purple and then turn  into blisters or peeling of the skin. You may also notice a red rash with swelling of the face, lips, or lymph nodes in your neck or under your arms. Tell your care team right away if you have any change in your eyesight. Talk to your care team if you may be pregnant. Serious birth defects can occur if you take this medication during pregnancy and for 3 months after the last dose. You will need a negative pregnancy test before starting this medication. Contraception is recommended while taking this medication and for 3 months after the last dose. Your care team can help you find the option that works for you. Do not breastfeed while taking this medication and for 3 months after the  last dose. What side effects may I notice from receiving this medication? Side effects that you should report to your care team as soon as possible: Allergic reactions--skin rash, itching, hives, swelling of the face, lips, tongue, or throat Dry cough, shortness of breath or trouble breathing Eye pain, redness, irritation, or discharge with blurry or decreased vision Heart muscle inflammation--unusual weakness or fatigue, shortness of breath, chest pain, fast or irregular heartbeat, dizziness, swelling of the ankles, feet, or hands Hormone gland problems--headache, sensitivity to light, unusual weakness or fatigue, dizziness, fast or irregular heartbeat, increased sensitivity to cold or heat, excessive sweating, constipation, hair loss, increased thirst or amount of urine, tremors or shaking, irritability Infusion reactions--chest pain, shortness of breath or trouble breathing, feeling faint or lightheaded Kidney injury (glomerulonephritis)--decrease in the amount of urine, red or dark brown urine, foamy or bubbly urine, swelling of the ankles, hands, or feet Liver injury--right upper belly pain, loss of appetite, nausea, light-colored stool, dark yellow or brown urine, yellowing skin or eyes, unusual weakness or fatigue Pain, tingling, or numbness in the hands or feet, muscle weakness, change in vision, confusion or trouble speaking, loss of balance or coordination, trouble walking, seizures Rash, fever, and swollen lymph nodes Redness, blistering, peeling, or loosening of the skin, including inside the mouth Sudden or severe stomach pain, bloody diarrhea, fever, nausea, vomiting Side effects that usually do not require medical attention (report these to your care team if they continue or are bothersome): Bone, joint, or muscle pain Diarrhea Fatigue Loss of appetite Nausea Skin rash This list may not describe all possible side effects. Call your doctor for medical advice about side effects.  You may report side effects to FDA at 1-800-FDA-1088. Where should I keep my medication? This medication is given in a hospital or clinic. It will not be stored at home. NOTE: This sheet is a summary. It may not cover all possible information. If you have questions about this medicine, talk to your doctor, pharmacist, or health care provider.  2024 Elsevier/Gold Standard (2022-01-09 00:00:00)

## 2023-11-19 NOTE — Progress Notes (Signed)
 Ok to proceed with elevated LFTs and bili from [facility] on 11/04/23 per Dr Arlana Pouch

## 2023-11-20 ENCOUNTER — Telehealth: Payer: Self-pay | Admitting: Emergency Medicine

## 2023-11-20 ENCOUNTER — Other Ambulatory Visit: Payer: Self-pay

## 2023-11-20 NOTE — Telephone Encounter (Signed)
 24 HOUR CALLBACK  24 Hour callback post 1st time Imfinzi. Patient reports 1 episode of diarrhea that may be attributed to her taking antibiotics. Otherwise, no issues.Patient knows to call with any questions or concerns

## 2023-11-24 ENCOUNTER — Encounter: Payer: Self-pay | Admitting: Oncology

## 2023-11-25 LAB — METANEPHRINES, URINE, 24 HOUR
Metaneph Total, Ur: 98 ug/L
Metanephrines, 24H Ur: 176 ug/(24.h) (ref 36–209)
Normetanephrine, 24H Ur: 592 ug/(24.h) (ref 131–612)
Normetanephrine, Ur: 329 ug/L
Total Volume: 1800

## 2023-11-26 ENCOUNTER — Ambulatory Visit
Admission: RE | Admit: 2023-11-26 | Discharge: 2023-11-26 | Disposition: A | Discharge status: Home / Self Care | Payer: Medicare Other | Source: Ambulatory Visit | Attending: Internal Medicine

## 2023-11-26 ENCOUNTER — Ambulatory Visit
Admission: RE | Admit: 2023-11-26 | Discharge: 2023-11-26 | Disposition: A | Discharge status: Home / Self Care | Payer: Medicare Other | Source: Ambulatory Visit | Attending: Internal Medicine | Admitting: Internal Medicine

## 2023-11-26 DIAGNOSIS — N6042 Mammary duct ectasia of left breast: Secondary | ICD-10-CM

## 2023-11-27 ENCOUNTER — Inpatient Hospital Stay

## 2023-11-27 NOTE — Progress Notes (Signed)
 [facility] Clinical Social Work  Initial Assessment   Dana Thornton is a 68 y.o. year old female contacted by phone. Clinical Social Work was referred by new patient protocol for assessment of psychosocial needs.   SDOH (Social Determinants of Health) assessments performed: Yes SDOH Interventions    Flowsheet Row Documentation from 11/12/2023 in [facility] - A Dept Of [place]. [facility] Office Visit from 10/29/2023 in [facility] - A Dept Of [place]  SDOH Interventions    Food Insecurity Interventions --  [facility] Food Pantry] Intervention Not Indicated  Housing Interventions -- Intervention Not Indicated  Transportation Interventions -- Intervention Not Indicated  Utilities Interventions -- Intervention Not Indicated       SDOH Screenings   Food Insecurity: No Food Insecurity (11/27/2023)  Recent Concern: Food Insecurity - Food Insecurity Present (11/13/2023)  Housing: Low Risk  (11/27/2023)  Transportation Needs: No Transportation Needs (11/27/2023)  Utilities: Not At Risk (11/27/2023)  Depression (PHQ2-9): Low Risk  (10/29/2023)  Social Connections: Socially Integrated (10/10/2023)  Tobacco Use: Low Risk  (11/12/2023)    Family/Social Information:  Housing Arrangement: patient lives with her spouse. Family members/support persons in your life? Patient reported her daughter and son (twins, young adults), husband, and prayer group are her primary sources of support.  Patient's son lives in [place], [place] and has been traveling up when needed. Patients daughter has been helping her parents out in the home by cooking and ensuring patient eats.  Transportation concerns: No, Patient stated her spouse, daughter, and prayer group will serve as transportation.  Employment: Retired Patient retired from [employer] in 2020.  Income source: Actor concerns: No Type of concern: None Food access concerns: no Religious or  spiritual practice: Yes-Christian. Patient's [facility] and Renato Gails is a support system.  Advanced directives: No Services Currently in place:  Supportive Family, Insurance, Museum/gallery curator, Transportation, Income  Coping/ Adjustment to diagnosis: Patient understands treatment plan and what happens next? yes Concerns about diagnosis and/or treatment:  Patient denied any immediate concerns to treatment and diagnosis. Patient completed her first treatment on 3/11. Patient stated her infusion nurse was a big contributor in feeling comfortable in the infusion room. Patient reported stressors: Adjusting to my illness Hopes and/or priorities: For treatment to work.  Patient enjoys time with family/ friends and Prayer Current coping skills/ strengths: Ability for insight , Active sense of humor , Average or above average intelligence , Capable of independent living , Communication skills , Contractor , General fund of knowledge , Motivation for treatment/growth , Religious Affiliation , and Supportive family/friends     SUMMARY: Current SDOH Barriers:  No SDOH Barriers at this time  Clinical Social Work Clinical Goal(s):  No clinical social work goals at this time  Interventions: Discussed common feeling and emotions when being diagnosed with cancer, and the importance of support during treatment Informed patient of the support team roles and support services at [facility] Provided CSW contact information and encouraged patient to call with any questions or concerns   Follow Up Plan: CSW will see patient on 4/08 Patient verbalizes understanding of plan: Yes   Marguerita Merles, LCSW Clinical Social Worker [facility]

## 2023-12-16 ENCOUNTER — Telehealth: Payer: Self-pay

## 2023-12-16 ENCOUNTER — Other Ambulatory Visit: Payer: Self-pay

## 2023-12-16 NOTE — Telephone Encounter (Signed)
 Opened in error

## 2023-12-16 NOTE — Telephone Encounter (Signed)
 Followed up via phone conversation with patient this morning following a concerning phone message left with the triage nurse on Sunday evening. Patient is post menopausal and reported vaginal bleeding. Denied cramps and fever. I spoke with her this morning and she is still bleeding like a regular period. She is not dizzy or weak or SOB. She is scheduled to see Dr. Arlana Pouch tomorrow.   Dr. Arlana Pouch was made aware this morning with the following response, "If she is not otherwise symptomatic, I can see her as scheduled tomorrow and discuss next steps in management. If she already is established with OB/GYN, she needs to get in touch with them. Otherwise we will have to refer her to OB/GYN clinic for further evaluation."  I followed up with patient via phone call to update her on Dr. Zenda Alpers recommendations. Patient does have an established OBGYN with she is going to call today to report her symptoms. Patient instructed to go to the ED if she becomes symptomatic for blood loss such as weakness, SOB, dizziness, paleness, rapid heart rate. Patient agreed to all recommendations.

## 2023-12-17 ENCOUNTER — Ambulatory Visit

## 2023-12-17 ENCOUNTER — Ambulatory Visit: Admitting: Oncology

## 2023-12-17 ENCOUNTER — Other Ambulatory Visit: Payer: Self-pay

## 2023-12-17 ENCOUNTER — Inpatient Hospital Stay: Attending: Oncology

## 2023-12-17 ENCOUNTER — Other Ambulatory Visit: Payer: Self-pay | Admitting: *Deleted

## 2023-12-17 ENCOUNTER — Inpatient Hospital Stay

## 2023-12-17 ENCOUNTER — Encounter: Payer: Self-pay | Admitting: Oncology

## 2023-12-17 ENCOUNTER — Other Ambulatory Visit

## 2023-12-17 ENCOUNTER — Inpatient Hospital Stay (HOSPITAL_BASED_OUTPATIENT_CLINIC_OR_DEPARTMENT_OTHER): Admitting: Oncology

## 2023-12-17 VITALS — BP 107/62 | HR 79

## 2023-12-17 VITALS — BP 92/62 | HR 102 | Temp 97.6°F | Resp 18 | Ht 62.0 in | Wt 208.5 lb

## 2023-12-17 DIAGNOSIS — Z888 Allergy status to other drugs, medicaments and biological substances status: Secondary | ICD-10-CM | POA: Diagnosis not present

## 2023-12-17 DIAGNOSIS — R188 Other ascites: Secondary | ICD-10-CM | POA: Insufficient documentation

## 2023-12-17 DIAGNOSIS — R778 Other specified abnormalities of plasma proteins: Secondary | ICD-10-CM

## 2023-12-17 DIAGNOSIS — R140 Abdominal distension (gaseous): Secondary | ICD-10-CM | POA: Insufficient documentation

## 2023-12-17 DIAGNOSIS — C7971 Secondary malignant neoplasm of right adrenal gland: Secondary | ICD-10-CM | POA: Diagnosis not present

## 2023-12-17 DIAGNOSIS — K7460 Unspecified cirrhosis of liver: Secondary | ICD-10-CM | POA: Diagnosis not present

## 2023-12-17 DIAGNOSIS — D689 Coagulation defect, unspecified: Secondary | ICD-10-CM | POA: Insufficient documentation

## 2023-12-17 DIAGNOSIS — R601 Generalized edema: Secondary | ICD-10-CM | POA: Insufficient documentation

## 2023-12-17 DIAGNOSIS — E119 Type 2 diabetes mellitus without complications: Secondary | ICD-10-CM | POA: Insufficient documentation

## 2023-12-17 DIAGNOSIS — I10 Essential (primary) hypertension: Secondary | ICD-10-CM | POA: Diagnosis not present

## 2023-12-17 DIAGNOSIS — N939 Abnormal uterine and vaginal bleeding, unspecified: Secondary | ICD-10-CM | POA: Diagnosis not present

## 2023-12-17 DIAGNOSIS — Z79899 Other long term (current) drug therapy: Secondary | ICD-10-CM | POA: Insufficient documentation

## 2023-12-17 DIAGNOSIS — C220 Liver cell carcinoma: Secondary | ICD-10-CM | POA: Diagnosis not present

## 2023-12-17 DIAGNOSIS — R9230 Dense breasts, unspecified: Secondary | ICD-10-CM | POA: Insufficient documentation

## 2023-12-17 DIAGNOSIS — Z885 Allergy status to narcotic agent status: Secondary | ICD-10-CM | POA: Insufficient documentation

## 2023-12-17 DIAGNOSIS — I2510 Atherosclerotic heart disease of native coronary artery without angina pectoris: Secondary | ICD-10-CM | POA: Diagnosis not present

## 2023-12-17 DIAGNOSIS — E785 Hyperlipidemia, unspecified: Secondary | ICD-10-CM | POA: Insufficient documentation

## 2023-12-17 DIAGNOSIS — D649 Anemia, unspecified: Secondary | ICD-10-CM | POA: Insufficient documentation

## 2023-12-17 DIAGNOSIS — K754 Autoimmune hepatitis: Secondary | ICD-10-CM | POA: Diagnosis not present

## 2023-12-17 DIAGNOSIS — Z8505 Personal history of malignant neoplasm of liver: Secondary | ICD-10-CM | POA: Diagnosis not present

## 2023-12-17 DIAGNOSIS — Z5112 Encounter for antineoplastic immunotherapy: Secondary | ICD-10-CM | POA: Insufficient documentation

## 2023-12-17 DIAGNOSIS — R0602 Shortness of breath: Secondary | ICD-10-CM | POA: Insufficient documentation

## 2023-12-17 DIAGNOSIS — N6042 Mammary duct ectasia of left breast: Secondary | ICD-10-CM | POA: Insufficient documentation

## 2023-12-17 DIAGNOSIS — N179 Acute kidney failure, unspecified: Secondary | ICD-10-CM | POA: Diagnosis not present

## 2023-12-17 LAB — CMP (CANCER CENTER ONLY)
ALT: 75 U/L — ABNORMAL HIGH (ref 0–44)
AST: 199 U/L (ref 15–41)
Albumin: 1.5 g/dL — ABNORMAL LOW (ref 3.5–5.0)
Alkaline Phosphatase: 112 U/L (ref 38–126)
Anion gap: 3 — ABNORMAL LOW (ref 5–15)
BUN: 23 mg/dL (ref 8–23)
CO2: 18 mmol/L — ABNORMAL LOW (ref 22–32)
Calcium: 8 mg/dL — ABNORMAL LOW (ref 8.9–10.3)
Chloride: 107 mmol/L (ref 98–111)
Creatinine: 2.53 mg/dL — ABNORMAL HIGH (ref 0.44–1.00)
GFR, Estimated: 20 mL/min — ABNORMAL LOW (ref >60)
Glucose, Bld: 120 mg/dL — ABNORMAL HIGH (ref 70–99)
Potassium: 5.6 mmol/L — ABNORMAL HIGH (ref 3.5–5.1)
Sodium: 128 mmol/L — ABNORMAL LOW (ref 135–145)
Total Bilirubin: 2.9 mg/dL — ABNORMAL HIGH (ref 0.0–1.2)
Total Protein: 9.3 g/dL — ABNORMAL HIGH (ref 6.5–8.1)

## 2023-12-17 LAB — CBC WITH DIFFERENTIAL (CANCER CENTER ONLY)
Abs Immature Granulocytes: 0.03 10*3/uL (ref 0.00–0.07)
Basophils Absolute: 0.1 10*3/uL (ref 0.0–0.1)
Basophils Relative: 1 %
Eosinophils Absolute: 0.1 10*3/uL (ref 0.0–0.5)
Eosinophils Relative: 2 %
HCT: 24.7 % — ABNORMAL LOW (ref 36.0–46.0)
Hemoglobin: 8.7 g/dL — ABNORMAL LOW (ref 12.0–15.0)
Immature Granulocytes: 0 %
Lymphocytes Relative: 24 %
Lymphs Abs: 1.8 10*3/uL (ref 0.7–4.0)
MCH: 32.7 pg (ref 26.0–34.0)
MCHC: 35.2 g/dL (ref 30.0–36.0)
MCV: 92.9 fL (ref 80.0–100.0)
Monocytes Absolute: 0.6 10*3/uL (ref 0.1–1.0)
Monocytes Relative: 8 %
Neutro Abs: 4.9 10*3/uL (ref 1.7–7.7)
Neutrophils Relative %: 65 %
Platelet Count: 102 10*3/uL — ABNORMAL LOW (ref 150–400)
RBC: 2.66 MIL/uL — ABNORMAL LOW (ref 3.87–5.11)
RDW: 17.2 % — ABNORMAL HIGH (ref 11.5–15.5)
WBC Count: 7.6 10*3/uL (ref 4.0–10.5)
nRBC: 0 % (ref 0.0–0.2)

## 2023-12-17 LAB — PROTIME-INR
INR: 2.9 — ABNORMAL HIGH (ref 0.8–1.2)
Prothrombin Time: 30.2 s — ABNORMAL HIGH (ref 11.4–15.2)

## 2023-12-17 LAB — URIC ACID: Uric Acid, Serum: 11 mg/dL — ABNORMAL HIGH (ref 2.5–7.1)

## 2023-12-17 LAB — FIBRINOGEN: Fibrinogen: 163 mg/dL — ABNORMAL LOW (ref 210–475)

## 2023-12-17 LAB — APTT: aPTT: 50 s — ABNORMAL HIGH (ref 24–36)

## 2023-12-17 LAB — LACTATE DEHYDROGENASE: LDH: 224 U/L — ABNORMAL HIGH (ref 98–192)

## 2023-12-17 MED ORDER — TREMELIMUMAB-ACTL CHEMO INJECTION 25 MG/1.25ML
300.0000 mg | Freq: Once | INTRAVENOUS | Status: AC
  Administered 2023-12-17: 300 mg via INTRAVENOUS
  Filled 2023-12-17: qty 15

## 2023-12-17 MED ORDER — DURVALUMAB 500 MG/10ML IV SOLN
1500.0000 mg | Freq: Once | INTRAVENOUS | Status: AC
  Administered 2023-12-17: 1500 mg via INTRAVENOUS
  Filled 2023-12-17: qty 30

## 2023-12-17 MED ORDER — SODIUM CHLORIDE 0.9 % IV SOLN: INTRAVENOUS | Status: DC

## 2023-12-17 MED ORDER — SODIUM CHLORIDE 0.9 % IV SOLN: Freq: Once | INTRAVENOUS | Status: AC

## 2023-12-17 NOTE — Progress Notes (Signed)
 CRITICAL VALUE STICKER  CRITICAL VALUE: AST [id number]   RECEIVER (on-site recipient of call):Kaeleigh Westendorf,RN  DATE & TIME NOTIFIED: 12/17/22 @ 0851  MESSENGER (representative from lab):amanda  MD NOTIFIED: Dr. Arlana Pouch  TIME OF NOTIFICATION: 1610  RESPONSE:

## 2023-12-17 NOTE — Progress Notes (Signed)
 [facility] CSW Progress Note  Visual merchandiser met with patient and spouse during infusion appointment to assess pyschosocial needs following provider appointment. Patient reported feeling "okay" and well supported by family and prayer group. CSW acknowledged patient's support and normalized feelings and emotions that come with a cancer diagnosis. CSW will follow up with patient in two weeks.   Marguerita Merles, LCSW Clinical Social Worker [facility]

## 2023-12-17 NOTE — Patient Instructions (Signed)
 [facility]   Discharge Instructions: Thank you for choosing [facility] to provide your oncology and hematology care.   If you have a lab appointment with the Cancer Center, please go directly to the Cancer Center and check in at the registration area.   Wear comfortable clothing and clothing appropriate for easy access to any Portacath or PICC line.   We strive to give you quality time with your provider. You may need to reschedule your appointment if you arrive late (15 or more minutes).  Arriving late affects you and other patients whose appointments are after yours.  Also, if you miss three or more appointments without notifying the office, you may be dismissed from the clinic at the provider's discretion.      For prescription refill requests, have your pharmacy contact our office and allow 72 hours for refills to be completed.    Today you received the following chemotherapy and/or immunotherapy agents IMJUDO/IMFINZI      To help prevent nausea and vomiting after your treatment, we encourage you to take your nausea medication as directed.  BELOW ARE SYMPTOMS THAT SHOULD BE REPORTED IMMEDIATELY: *FEVER GREATER THAN 100.4 F (38 C) OR HIGHER *CHILLS OR SWEATING *NAUSEA AND VOMITING THAT IS NOT CONTROLLED WITH YOUR NAUSEA MEDICATION *UNUSUAL SHORTNESS OF BREATH *UNUSUAL BRUISING OR BLEEDING *URINARY PROBLEMS (pain or burning when urinating, or frequent urination) *BOWEL PROBLEMS (unusual diarrhea, constipation, pain near the anus) TENDERNESS IN MOUTH AND THROAT WITH OR WITHOUT PRESENCE OF ULCERS (sore throat, sores in mouth, or a toothache) UNUSUAL RASH, SWELLING OR PAIN  UNUSUAL VAGINAL DISCHARGE OR ITCHING   Items with * indicate a potential emergency and should be followed up as soon as possible or go to the Emergency Department if any problems should occur.  Please show the CHEMOTHERAPY ALERT CARD or  IMMUNOTHERAPY ALERT CARD at check-in to the Emergency Department and triage nurse.  Should you have questions after your visit or need to cancel or reschedule your appointment, please contact [facility]  Dept: [phone]  and follow the prompts.  Office hours are 8:00 a.m. to 4:30 p.m. Monday - Friday. Please note that voicemails left after 4:00 p.m. may not be returned until the following business day.  We are closed weekends and major holidays. You have access to a nurse at all times for urgent questions. Please call the main number to the clinic Dept: [phone] and follow the prompts.   For any non-urgent questions, you may also contact your provider using MyChart. We now offer e-Visits for anyone 82 and older to request care online for non-urgent symptoms. For details visit [email or web address].   Also download the MyChart app! Go to the app store, search "MyChart", open the app, select [facility], and log in with your MyChart username and password.  Tremelimumab Injection What is this medication? TREMELIMUMAB (tre mel IM ue mab) treats liver cancer and lung cancer. It works by helping your immune system slow or stop the spread of cancer cells. It is a monoclonal antibody. This medicine may be used for other purposes; ask your health care provider or pharmacist if you have questions. COMMON BRAND NAME(S): IMJUDO What should I tell my care team before I take this medication? They need to know if you have any of these conditions: Autoimmune conditions, such as Crohn disease, ulcerative colitis, lupus Nervous system  conditions, such as Guillain-Barre syndrome or myasthenia gravis An unusual or allergic reaction to tremelimumab, other medications, foods, dyes, or preservatives Pregnant or trying to get pregnant Breastfeeding How should I use this medication? This medication is infused into a vein. It is given by your care team in  a hospital or clinic setting. A special MedGuide will be given to you before each treatment. Be sure to read this information carefully each time. Talk to your care team about the use of this medication in children. Special care may be needed. Overdosage: If you think you have taken too much of this medicine contact a poison control center or emergency room at once. NOTE: This medicine is only for you. Do not share this medicine with others. What if I miss a dose? Keep appointments for follow-up doses. It is important not to miss your dose. Call your care team if you are unable to keep an appointment. What may interact with this medication? Interactions have not been studied. This list may not describe all possible interactions. Give your health care provider a list of all the medicines, herbs, non-prescription drugs, or dietary supplements you use. Also tell them if you smoke, drink alcohol, or use illegal drugs. Some items may interact with your medicine. What should I watch for while using this medication? Your condition will be monitored carefully while you are receiving this medication. You may need blood work done while you are taking this medication. This medication may cause serious skin reactions. They can happen weeks to months after starting the medication. Contact your care team right away if you notice fevers or flu-like symptoms with a rash. The rash may be red or purple and then turn into blisters or peeling of the skin. You may also notice a red rash with swelling of the face, lips, or lymph nodes in your neck or under your arms. Tell your care team right away if you have any change in your eyesight. Talk to your care team if you may be pregnant. Serious birth defects can occur if you take this medication during pregnancy and for 3 months after the last dose. You will need a negative pregnancy test before starting this medication. Contraception is recommended while taking this  medication and for 3 months after the last dose. Your care team can help you find the option that works for you. Do not breastfeed while taking this medication and for 3 months after the last dose. What side effects may I notice from receiving this medication? Side effects that you should report to your care team as soon as possible: Allergic reactions--skin rash, itching, hives, swelling of the face, lips, tongue, or throat Dry cough, shortness of breath or trouble breathing Eye pain, redness, irritation, or discharge with blurry or decreased vision Heart muscle inflammation--unusual weakness or fatigue, shortness of breath, chest pain, fast or irregular heartbeat, dizziness, swelling of the ankles, feet, or hands Hormone gland problems--headache, sensitivity to light, unusual weakness or fatigue, dizziness, fast or irregular heartbeat, increased sensitivity to cold or heat, excessive sweating, constipation, hair loss, increased thirst or amount of urine, tremors or shaking, irritability Infusion reactions--chest pain, shortness of breath or trouble breathing, feeling faint or lightheaded Kidney injury (glomerulonephritis)--decrease in the amount of urine, red or dark brown urine, foamy or bubbly urine, swelling of the ankles, hands, or feet Liver injury--right upper belly pain, loss of appetite, nausea, light-colored stool, dark yellow or brown urine, yellowing skin or eyes, unusual weakness or fatigue Pain, tingling,  or numbness in the hands or feet, muscle weakness, change in vision, confusion or trouble speaking, loss of balance or coordination, trouble walking, seizures Pancreatitis--severe stomach pain that spreads to your back or gets worse after eating or when touched, fever, nausea, vomiting Rash, fever, and swollen lymph nodes Redness, blistering, peeling, or loosening of the skin, including inside the mouth Stomach pain that is severe, does not go away, or gets worse Sudden or severe  stomach pain, bloody diarrhea, fever, nausea, vomiting Side effects that usually do not require medical attention (report these to your care team if they continue or are bothersome): Bone, joint, or muscle pain Diarrhea Fatigue Loss of appetite Nausea Skin rash This list may not describe all possible side effects. Call your doctor for medical advice about side effects. You may report side effects to FDA at 1-800-FDA-1088. Where should I keep my medication? This medication is given in a hospital or clinic. It will not be stored at home. NOTE: This sheet is a summary. It may not cover all possible information. If you have questions about this medicine, talk to your doctor, pharmacist, or health care provider.  2024 Elsevier/Gold Standard (2022-10-15 00:00:00)  Durvalumab Injection What is this medication? DURVALUMAB (dur VAL ue mab) treats some types of cancer. It works by helping your immune system slow or stop the spread of cancer cells. It is a monoclonal antibody. This medicine may be used for other purposes; ask your health care provider or pharmacist if you have questions. COMMON BRAND NAME(S): IMFINZI What should I tell my care team before I take this medication? They need to know if you have any of these conditions: Allogeneic stem cell transplant (uses someone else's stem cells) Autoimmune diseases, such as Crohn disease, ulcerative colitis, lupus History of chest radiation Nervous system problems, such as Guillain-Barre syndrome, myasthenia gravis Organ transplant An unusual or allergic reaction to durvalumab, other medications, foods, dyes, or preservatives Pregnant or trying to get pregnant Breast-feeding How should I use this medication? This medication is infused into a vein. It is given by your care team in a hospital or clinic setting. A special MedGuide will be given to you before each treatment. Be sure to read this information carefully each time. Talk to your care  team about the use of this medication in children. Special care may be needed. Overdosage: If you think you have taken too much of this medicine contact a poison control center or emergency room at once. NOTE: This medicine is only for you. Do not share this medicine with others. What if I miss a dose? Keep appointments for follow-up doses. It is important not to miss your dose. Call your care team if you are unable to keep an appointment. What may interact with this medication? Interactions have not been studied. This list may not describe all possible interactions. Give your health care provider a list of all the medicines, herbs, non-prescription drugs, or dietary supplements you use. Also tell them if you smoke, drink alcohol, or use illegal drugs. Some items may interact with your medicine. What should I watch for while using this medication? Your condition will be monitored carefully while you are receiving this medication. You may need blood work while taking this medication. This medication may cause serious skin reactions. They can happen weeks to months after starting the medication. Contact your care team right away if you notice fevers or flu-like symptoms with a rash. The rash may be red or purple and then turn  into blisters or peeling of the skin. You may also notice a red rash with swelling of the face, lips, or lymph nodes in your neck or under your arms. Tell your care team right away if you have any change in your eyesight. Talk to your care team if you may be pregnant. Serious birth defects can occur if you take this medication during pregnancy and for 3 months after the last dose. You will need a negative pregnancy test before starting this medication. Contraception is recommended while taking this medication and for 3 months after the last dose. Your care team can help you find the option that works for you. Do not breastfeed while taking this medication and for 3 months after the  last dose. What side effects may I notice from receiving this medication? Side effects that you should report to your care team as soon as possible: Allergic reactions--skin rash, itching, hives, swelling of the face, lips, tongue, or throat Dry cough, shortness of breath or trouble breathing Eye pain, redness, irritation, or discharge with blurry or decreased vision Heart muscle inflammation--unusual weakness or fatigue, shortness of breath, chest pain, fast or irregular heartbeat, dizziness, swelling of the ankles, feet, or hands Hormone gland problems--headache, sensitivity to light, unusual weakness or fatigue, dizziness, fast or irregular heartbeat, increased sensitivity to cold or heat, excessive sweating, constipation, hair loss, increased thirst or amount of urine, tremors or shaking, irritability Infusion reactions--chest pain, shortness of breath or trouble breathing, feeling faint or lightheaded Kidney injury (glomerulonephritis)--decrease in the amount of urine, red or dark brown urine, foamy or bubbly urine, swelling of the ankles, hands, or feet Liver injury--right upper belly pain, loss of appetite, nausea, light-colored stool, dark yellow or brown urine, yellowing skin or eyes, unusual weakness or fatigue Pain, tingling, or numbness in the hands or feet, muscle weakness, change in vision, confusion or trouble speaking, loss of balance or coordination, trouble walking, seizures Rash, fever, and swollen lymph nodes Redness, blistering, peeling, or loosening of the skin, including inside the mouth Sudden or severe stomach pain, bloody diarrhea, fever, nausea, vomiting Side effects that usually do not require medical attention (report these to your care team if they continue or are bothersome): Bone, joint, or muscle pain Diarrhea Fatigue Loss of appetite Nausea Skin rash This list may not describe all possible side effects. Call your doctor for medical advice about side effects.  You may report side effects to FDA at 1-800-FDA-1088. Where should I keep my medication? This medication is given in a hospital or clinic. It will not be stored at home. NOTE: This sheet is a summary. It may not cover all possible information. If you have questions about this medicine, talk to your doctor, pharmacist, or health care provider.  2024 Elsevier/Gold Standard (2022-01-09 00:00:00)

## 2023-12-17 NOTE — Assessment & Plan Note (Signed)
 24-hour urine metanephrine analysis on 11/19/2023 showed 176 mcg of metanephrines in 24-hour urine, which is within normal range, not indicative of pheochromocytoma.  We will discuss with radiation oncology regarding SBRT to adrenal lesion, as recommended by GI tumor conference.

## 2023-12-17 NOTE — Assessment & Plan Note (Addendum)
 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 Confirmed via MRI. Complicated by autoimmune hepatitis.  Her case was presented at GI tumor conference by another provider on 10/16/2023.  It was determined that she is not a candidate for resection.  Plan was to obtain PET/CT following which a referral for liver directed therapy including Y90.  Also radiation oncology referral was to be considered for possible SBRT to adrenal lesion.  PET scan performed on 10/25/2023.  It did not show increased FDG activity in the liver lesion or in the adrenal lesion.  Her case was discussed again in tumor conference on 11/06/2023.  Recommendation made to refer her for possible liver transplant evaluation.  Liver transplantation team at [facility] reviewed her imaging studies and are concerned about clinically picture indicating metastatic disease to adrenal gland.  She was determined to be not a candidate for liver transplant.  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   24-hour urine metanephrine analysis on 11/19/2023 showed 176 mcg of metanephrines in 24-hour urine, which is within normal range, not indicative of pheochromocytoma.   Plan made for systemic treatment with tremelimumab plus durvalumab.  Started from 11/19/2023.  Her history of autoimmune hepatitis could complicate immunotherapy and we will closely monitor her for this.  She was referred to interventional radiology department regarding possibility of Y90 or other liver directed treatments.  Appointment pending.  Proceeded with cycle 2 of immunotherapy with durvalumab today and this will be continued every 4 weeks.  I will evaluate her in 2 weeks to repeat labs to see if she needs any supportive care.  We will discuss with radiation oncology department for possible SBRT to the adrenal lesion, now that it looks like a metastatic lesion and not pheochromocytoma.  Labs did show evidence of  coagulopathy today, likely from hepatocellular carcinoma and her history of autoimmune hepatitis.  However she is not showing any symptoms suggestive of DIC or acute coagulopathy.  If no obvious GYN etiology could be found for her uterine bleeding, we will bring her back to clinic soon for consideration of FFP or vitamin K treatments.  Next treatment will be due in 4 weeks from now.

## 2023-12-17 NOTE — Assessment & Plan Note (Signed)
 On labs today, she was found to have elevated total protein at 9.3 g/dL, albumin was low at 1.5 g/dL.  We will check myeloma labs including SPEP, IFE, quantitative immunoglobulins, serum free light chains for further evaluation.  She does have fluctuating renal dysfunction and creatinine was 2.53 today.  Hemoglobin 8.7.

## 2023-12-17 NOTE — Assessment & Plan Note (Signed)
 Recurrent ascites likely secondary to liver cirrhosis. Diuretics ineffective. Reports abdominal discomfort and tightness.   She had paracentesis on 11/08/2023 with removal of 1.8 L of ascitic fluid.  Cytology was negative for malignant cells.  She had another paracentesis within the last 2 to 3 weeks.  Now abdomen is distended again.  Referral sent to IR for repeat paracentesis.

## 2023-12-17 NOTE — Progress Notes (Signed)
 IR Paracentesis order entered per Dr. Arlana Pouch for ascites. Central Scheduling notified and agreed to schedule per order within the next week if possible.

## 2023-12-17 NOTE — Assessment & Plan Note (Addendum)
 New onset of bleeding resembling a menstrual cycle since Sunday, despite being postmenopausal. No associated abdominal pain, dizziness, or lightheadedness. She is scheduled for evaluation at [facility], likely to include an ultrasound. - Proceed with scheduled appointment at [facility] for further evaluation. - Anticipate possible ultrasound to assess the cause of bleeding.  -Labs did show evidence of coagulopathy today, likely from hepatocellular carcinoma and her history of autoimmune hepatitis.  However she is not showing any symptoms suggestive of DIC or acute coagulopathy.  If no obvious GYN etiology could be found, we will bring her back to clinic soon for consideration of FFP or vitamin K treatments.

## 2023-12-17 NOTE — Progress Notes (Signed)
 Ok to tx with elevated bili per Dr Arlana Pouch.

## 2023-12-17 NOTE — Progress Notes (Signed)
 [facility]  ONCOLOGY CLINIC PROGRESS NOTE   Patient Care Team: Ollen Bowl, MD as PCP - General (Internal Medicine) Charlott Rakes, MD as Consulting Physician (Gastroenterology) Jerrell Mylar Jacquelyne Balint, RN as Oncology Nurse Navigator  PATIENT NAME: Dana Thornton   MR#: [id number] DOB: 04-Jan-1956  Date of visit: 12/17/2023   ASSESSMENT & PLAN:   Dana Thornton is a 68 y.o. pleasant lady with a past medical history of autoimmune hepatitis, for which she was seeing gastroenterologist Dr. Bosie Clos, diabetes mellitus, hypertension, dyslipidemia, was referred to our clinic in February 2025 for newly diagnosed hepatocellular carcinoma, noted on MRI abdomen.    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 Confirmed via MRI. Complicated by autoimmune hepatitis.  Her case was presented at GI tumor conference by another provider on 10/16/2023.  It was determined that she is not a candidate for resection.  Plan was to obtain PET/CT following which a referral for liver directed therapy including Y90.  Also radiation oncology referral was to be considered for possible SBRT to adrenal lesion.  PET scan performed on 10/25/2023.  It did not show increased FDG activity in the liver lesion or in the adrenal lesion.  Her case was discussed again in tumor conference on 11/06/2023.  Recommendation made to refer her for possible liver transplant evaluation.  Liver transplantation team at [facility] reviewed her imaging studies and are concerned about clinically picture indicating metastatic disease to adrenal gland.  She was determined to be not a candidate for liver transplant.  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   24-hour urine metanephrine analysis on 11/19/2023 showed 176 mcg of metanephrines in 24-hour urine, which is within normal range, not indicative of pheochromocytoma.   Plan made  for systemic treatment with tremelimumab plus durvalumab.  Started from 11/19/2023.  Her history of autoimmune hepatitis could complicate immunotherapy and we will closely monitor her for this.  She was referred to interventional radiology department regarding possibility of Y90 or other liver directed treatments.  Appointment pending.  Proceeded with cycle 2 of immunotherapy with durvalumab today and this will be continued every 4 weeks.  I will evaluate her in 2 weeks to repeat labs to see if she needs any supportive care.  We will discuss with radiation oncology department for possible SBRT to the adrenal lesion, now that it looks like a metastatic lesion and not pheochromocytoma.  Labs did show evidence of coagulopathy today, likely from hepatocellular carcinoma and her history of autoimmune hepatitis.  However she is not showing any symptoms suggestive of DIC or acute coagulopathy.  If no obvious GYN etiology could be found for her uterine bleeding, we will bring her back to clinic soon for consideration of FFP or vitamin K treatments.  Next treatment will be due in 4 weeks from now.  Ascites Recurrent ascites likely secondary to liver cirrhosis. Diuretics ineffective. Reports abdominal discomfort and tightness.   She had paracentesis on 11/08/2023 with removal of 1.8 L of ascitic fluid.  Cytology was negative for malignant cells.  She had another paracentesis within the last 2 to 3 weeks.  Now abdomen is distended again.  Referral sent to IR for repeat paracentesis.  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  We will discuss with radiation oncology regarding SBRT  to adrenal lesion, as recommended by GI tumor conference.  Abnormal uterine bleeding New onset of bleeding resembling a menstrual cycle since Sunday, despite being  postmenopausal. No associated abdominal pain, dizziness, or lightheadedness. She is scheduled for evaluation at [facility], likely to include an ultrasound. - Proceed with scheduled appointment at [facility] for further evaluation. - Anticipate possible ultrasound to assess the cause of bleeding.  -Labs did show evidence of coagulopathy today, likely from hepatocellular carcinoma and her history of autoimmune hepatitis.  However she is not showing any symptoms suggestive of DIC or acute coagulopathy.  If no obvious GYN etiology could be found, we will bring her back to clinic soon for consideration of FFP or vitamin K treatments.  Elevated total protein On labs today, she was found to have elevated total protein at 9.3 g/dL, albumin was low at 1.5 g/dL.  We will check myeloma labs including SPEP, IFE, quantitative immunoglobulins, serum free light chains for further evaluation.  She does have fluctuating renal dysfunction and creatinine was 2.53 today.  Hemoglobin 8.7.     I reviewed lab results and outside records for this visit and discussed relevant results with the patient. Diagnosis, plan of care and treatment options were also discussed in detail with the patient. Opportunity provided to ask questions and answers provided to her apparent satisfaction. Provided instructions to call our clinic with any problems, questions or concerns prior to return visit. I recommended to continue follow-up with PCP and sub-specialists. She verbalized understanding and agreed with the plan.   NCCN guidelines have been consulted in the planning of this patient's care.  I spent a total of 45 minutes during this encounter with the patient including review of chart and various tests results, discussions about plan of care and coordination of care plan.   [name], MD  12/17/2023 3:43 PM  [facility] - A DE[facility] [address] [place] [address] Dept: [phone] Dept Fax: [phone]    CHIEF COMPLAINT/ REASON FOR VISIT:   Hepatocellular carcinoma with possible metastatic disease to right adrenal gland.  Current Treatment: Plan made for systemic treatment with tremelimumab plus durvalumab.  Started from 11/19/2023.  Also referred for liver directed therapy with Y90.  INTERVAL HISTORY:    Discussed the use of AI scribe software for clinical note transcription with the patient, who gave verbal consent to proceed.   Dana Thornton is here today for repeat clinical assessment.   She has been experiencing new onset vaginal bleeding since Sunday, which she describes as similar to a normal menstrual cycle despite having gone through menopause. No associated abdominal cramps, dizziness, lightheadedness, chest pain, or breathing difficulties.  She has a history of liver cancer and is awaiting a Y90 radioembolization procedure, which involves interventional radiology. Her last abdominal paracentesis was a couple of weeks ago, prior to her first treatment, and her abdomen remains distended.  She reports improved eating habits, consuming protein shakes when she does not feel like eating solid food. No constipation and regular bowel movements. She has been experiencing dehydration and is aiming to drink at least two liters of water daily.  Her current medications include spironolactone, which she has not been taking recently, and she is advised to verify her current dosage. She also takes amlodipine, which she is instructed to take only if her systolic blood pressure is above 130 mmHg. She is currently on antibiotics for diverticulitis, taking  metronidazole 500 mg twice a day, as adjusted for her kidney function. Recent lab results show a high potassium level and elevated creatinine, indicating dehydration. Her hemoglobin is stable at 8.7, and her platelet count is 102,000. Blood sugar levels are  well-controlled, with a recent reading of 101.  I have reviewed the past medical history, past surgical history, social history and family history with the patient and they are unchanged from previous note.  HISTORY OF PRESENT ILLNESS:   ONCOLOGY HISTORY:   She presented to the ED on 10/10/2023 due to abdominal pain that was radiating to her back and abnormal findings on the CT scan that was ordered by her primary physician.  CT of abdomen pelvis on 10/10/2023 showed heterogeneously enhancing 4 x 4 cm right hepatic lobe mass in segment 6, highly suspicious for hepatoma.  MRI recommended for further evaluation.  Also noted was heterogeneously enhancing 3.8 x 2.2 cm right adrenal gland mass concerning for metastatic disease.  Therefore oncology consult was requested when the patient was hospitalized.  Patient was evaluated by Dr. Al Pimple.   AFP was significantly elevated at 458 ng/mL on 10/10/2023.   Medical history includes hypertension, diabetes, history of gallstones and cirrhosis for which she sees her primary GI Dr. Bosie Clos.   Patient has underlying history of cirrhosis however never has been decompensated and did not require paracentesis, variceal ligation or had history of encephalopathy.  She follows up with Dr. Bosie Clos in gastroenterology.   On 10/13/2023, MRI abdomen with and without contrast was obtained for further evaluation.  Examination was limited by breath motion artifact.  There was subtly hyperenhancing mass of inferior right lobe of liver, hepatic segment 6 with clear evidence of washout but without capsular enhancement, measuring 3.4 x 3.3 cm.  This was consistent with hepatocellular carcinoma, LI-RADS category 5.  Also noted was heterogeneously enhancing mass of the right adrenal gland measuring 4.1 x 2.3 cm, new compared to March 2019 and highly concerning for malignancy.  Isolated adrenal metastatic hepatocellular carcinoma is rare but has been reported in the literature.  Cirrhosis  and small volume ascites was noted.  Anasarca noted.   On 10/14/2023, CT chest without contrast showed no evidence of intrathoracic metastatic disease.   Her case was presented at GI tumor conference by Dr. Al Pimple on 10/16/2023.  It was determined that she is not a candidate for resection.  Plan was to obtain PET/CT following which a referral for liver directed therapy including Y90.  Also radiation oncology referral was to be considered for possible SBRT to adrenal lesion.   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   24-hour urine metanephrine analysis on 11/19/2023 showed 176 mcg of metanephrines in 24-hour urine, which is within normal range, not indicative of pheochromocytoma.  She was determined to be not a candidate for liver transplant because of the adrenal lesion.   Plan made for systemic treatment with tremelimumab plus durvalumab.  Started from 11/19/2023.    Her history of autoimmune hepatitis could complicate immunotherapy and we will closely monitor her for this.    Oncology History  Hepatocellular carcinoma (HCC)  10/29/2023 Initial Diagnosis   Hepatocellular carcinoma (HCC)   10/29/2023 Cancer Staging   Staging form: Liver, AJCC 8th Edition - Clinical: Stage IVB (cT1b, cN0, cM1) - Signed by [name], MD on 10/29/2023 Stage prefix: Initial diagnosis   11/19/2023 -  Chemotherapy   Patient is on Treatment Plan : Hepatocellular Carcinoma Tremelimumab-actl C1 D1 + Durvalumab q28d  REVIEW OF SYSTEMS:   Review of Systems - Oncology  All other pertinent systems were reviewed with the patient and are negative.  ALLERGIES: She is allergic to ace inhibitors, codeine, and statins.  MEDICATIONS:  Current Outpatient Medications  Medication Sig Dispense Refill   ALEVE 220 MG tablet Take 220-440 mg by mouth 2 (two) times daily as needed (for pain or headaches).     amLODipine (NORVASC) 10 MG tablet Take 1 tablet (10 mg total) by mouth daily. 30 tablet 1    carvedilol (COREG) 25 MG tablet Take 25 mg by mouth 2 (two) times daily with a meal.     ezetimibe (ZETIA) 10 MG tablet Take 10 mg by mouth daily.     furosemide (LASIX) 40 MG tablet Take 40 mg by mouth daily as needed for fluid or edema.     insulin lispro (HUMALOG KWIKPEN) 100 UNIT/ML KwikPen Inject 10 Units into the skin 3 (three) times daily with meals as needed (AS DIRECTED FOR AN ELEVATED BGL).     levothyroxine (SYNTHROID) 300 MCG tablet Take 150-300 mcg by mouth See admin instructions. Take 150 mcg by mouth 30 minutes before breakfast on Sunday and Saturday and 300 mcg on Mon/Tues/Wed/Thurs/Fri     losartan (COZAAR) 100 MG tablet Take 100 mg by mouth daily.     mercaptopurine (PURINETHOL) 50 MG tablet Take 50 mg by mouth daily. Give on an empty stomach 1 hour before or 2 hours after meals. Caution: Chemotherapy.     metroNIDAZOLE (FLAGYL) 500 MG tablet Take 1 tablet (500 mg total) by mouth 3 (three) times daily. 42 tablet 0   ondansetron (ZOFRAN) 4 MG tablet Take 4 mg by mouth every 6 (six) hours as needed for nausea or vomiting.     ondansetron (ZOFRAN) 8 MG tablet Take 1 tablet (8 mg total) by mouth every 8 (eight) hours as needed for nausea or vomiting. 30 tablet 1   predniSONE (DELTASONE) 10 MG tablet Take 2 tablets (20 mg) daily with food for 4 days, then 1 tablet (10 mg) daily with food until seen by your GI doctor (Patient taking differently: Take 10 mg by mouth daily with breakfast.) 30 tablet 1   prochlorperazine (COMPAZINE) 10 MG tablet Take 1 tablet (10 mg total) by mouth every 6 (six) hours as needed for nausea or vomiting. 30 tablet 1   triamcinolone cream (KENALOG) 0.1 % Apply 1 Application topically 2 (two) times daily as needed (for irritation, as directed- affected area).     TRULICITY 0.75 MG/0.5ML SOAJ Inject 0.75 mg into the skin every Sunday.     Vitamin D, Ergocalciferol, (DRISDOL) 50000 units CAPS capsule Take 50,000 Units by mouth every Sunday.     insulin glargine  (LANTUS) 100 UNIT/ML injection 30  Units sq Qhs and 12 units q am (Patient not taking: Reported on 11/12/2023) 10 mL 2   No current facility-administered medications for this visit.   Facility-Administered Medications Ordered in Other Visits  Medication Dose Route Frequency Provider Last Rate Last Admin   0.9 %  sodium chloride infusion   Intravenous Continuous Delorese Sellin, MD   Stopped at 12/17/23 1500     VITALS:   Blood pressure 92/62, pulse (!) 102, temperature 97.6 F (36.4 C), temperature source Temporal, resp. rate 18, height 5\' 2"  (1.575 m), weight 208 lb 8 oz (94.6 kg), SpO2 100%.  Wt Readings from Last 3 Encounters:  12/17/23 208 lb 8 oz (94.6 kg)  11/19/23 199 lb 1.6 oz (90.3  kg)  11/12/23 210 lb 11.2 oz (95.6 kg)    Body mass index is 38.14 kg/m.    Onc Performance Status - 12/17/23 0854       ECOG Perf Status   ECOG Perf Status Restricted in physically strenuous activity but ambulatory and able to carry out work of a light or sedentary nature, e.g., light house work, office work      KPS SCALE   KPS % SCORE Normal activity with effort, some s/s of disease             PHYSICAL EXAM:   Physical Exam Constitutional:      General: She is not in acute distress.    Appearance: Normal appearance.  HENT:     Head: Normocephalic and atraumatic.  Eyes:     General: No scleral icterus.    Conjunctiva/sclera: Conjunctivae normal.  Cardiovascular:     Rate and Rhythm: Normal rate and regular rhythm.     Heart sounds: Normal heart sounds.  Pulmonary:     Effort: Pulmonary effort is normal.     Breath sounds: Normal breath sounds.  Abdominal:     General: There is distension.  Musculoskeletal:     Right lower leg: No edema.     Left lower leg: No edema.  Neurological:     General: No focal deficit present.     Mental Status: She is alert and oriented to person, place, and time.  Psychiatric:        Mood and Affect: Mood normal.        Behavior: Behavior  normal.        Thought Content: Thought content normal.       LABORATORY DATA:   I have reviewed the data as listed.  Results for orders placed or performed in visit on 12/17/23  CMP (Cancer Center only)  Result Value Ref Range   Sodium 128 (L) 135 - 145 mmol/L   Potassium 5.6 (H) 3.5 - 5.1 mmol/L   Chloride 107 98 - 111 mmol/L   CO2 18 (L) 22 - 32 mmol/L   Glucose, Bld 120 (H) 70 - 99 mg/dL   BUN 23 8 - 23 mg/dL   Creatinine 1.61 (H) 0.96 - 1.00 mg/dL   Calcium 8.0 (L) 8.9 - 10.3 mg/dL   Total Protein 9.3 (H) 6.5 - 8.1 g/dL   Albumin 1.5 (L) 3.5 - 5.0 g/dL   AST 045 (HH) 15 - 41 U/L   ALT 75 (H) 0 - 44 U/L   Alkaline Phosphatase 112 38 - 126 U/L   Total Bilirubin 2.9 (H) 0.0 - 1.2 mg/dL   GFR, Estimated 20 (L) >60 mL/min   Anion gap 3 (L) 5 - 15  CBC with Differential (Cancer Center Only)  Result Value Ref Range   WBC Count 7.6 4.0 - 10.5 K/uL   RBC 2.66 (L) 3.87 - 5.11 MIL/uL   Hemoglobin 8.7 (L) 12.0 - 15.0 g/dL   HCT 40.9 (L) 81.1 - 91.4 %   MCV 92.9 80.0 - 100.0 fL   MCH 32.7 26.0 - 34.0 pg   MCHC 35.2 30.0 - 36.0 g/dL   RDW 78.2 (H) 95.6 - 21.3 %   Platelet Count 102 (L) 150 - 400 K/uL   nRBC 0.0 0.0 - 0.2 %   Neutrophils Relative % 65 %   Neutro Abs 4.9 1.7 - 7.7 K/uL   Lymphocytes Relative 24 %   Lymphs Abs 1.8 0.7 - 4.0 K/uL  Monocytes Relative 8 %   Monocytes Absolute 0.6 0.1 - 1.0 K/uL   Eosinophils Relative 2 %   Eosinophils Absolute 0.1 0.0 - 0.5 K/uL   Basophils Relative 1 %   Basophils Absolute 0.1 0.0 - 0.1 K/uL   Immature Granulocytes 0 %   Abs Immature Granulocytes 0.03 0.00 - 0.07 K/uL  Uric acid  Result Value Ref Range   Uric Acid, Serum 11.0 (H) 2.5 - 7.1 mg/dL  Lactate dehydrogenase  Result Value Ref Range   LDH 224 (H) 98 - 192 U/L  Fibrinogen  Result Value Ref Range   Fibrinogen 163 (L) 210 - 475 mg/dL  Protime-INR  Result Value Ref Range   Prothrombin Time 30.2 (H) 11.4 - 15.2 seconds   INR 2.9 (H) 0.8 - 1.2  APTT  Result  Value Ref Range   aPTT 50 (H) 24 - 36 seconds    RADIOGRAPHIC STUDIES:  I have personally reviewed the radiological images as listed and agree with the findings in the report.  MM 3D DIAGNOSTIC MAMMOGRAM BILATERAL BREAST Result Date: 11/26/2023 CLINICAL DATA:  Short-term follow-up for probably benign findings in the left breast, initially assessed on 12/17/2022. EXAM: DIGITAL DIAGNOSTIC BILATERAL MAMMOGRAM WITH TOMOSYNTHESIS AND CAD; ULTRASOUND LEFT BREAST LIMITED TECHNIQUE: Bilateral digital diagnostic mammography and breast tomosynthesis was performed. The images were evaluated with computer-aided detection. ; Targeted ultrasound examination of the left breast was performed. COMPARISON:  Previous exam(s). ACR Breast Density Category b: There are scattered areas of fibroglandular density. FINDINGS: Mildly dilated ducts in the retroareolar left breast are without change. There are no defined masses, areas of significant asymmetry, areas of architectural distortion or suspicious calcifications. Targeted left breast ultrasound is performed, showing mildly ectatic retroareolar ducts, with no intraductal debris or tissue. No mass. Ducts are stable to less prominent than on the prior studies. IMPRESSION: 1. No evidence of breast malignancy. 2. Benign retroareolar left breast duct ectasia. RECOMMENDATION: Screening mammogram in one year.(Code:SM-B-01Y) I have discussed the findings and recommendations with the patient. If applicable, a reminder letter will be sent to the patient regarding the next appointment. BI-RADS CATEGORY  2: Benign. Electronically Signed   By: Amie Portland M.D.   On: 11/26/2023 08:26   [place] LIMITED ULTRASOUND INCLUDING AXILLA LEFT BREAST  Result Date: 11/26/2023 CLINICAL DATA:  Short-term follow-up for probably benign findings in the left breast, initially assessed on 12/17/2022. EXAM: DIGITAL DIAGNOSTIC BILATERAL MAMMOGRAM WITH TOMOSYNTHESIS AND CAD; ULTRASOUND LEFT BREAST LIMITED  TECHNIQUE: Bilateral digital diagnostic mammography and breast tomosynthesis was performed. The images were evaluated with computer-aided detection. ; Targeted ultrasound examination of the left breast was performed. COMPARISON:  Previous exam(s). ACR Breast Density Category b: There are scattered areas of fibroglandular density. FINDINGS: Mildly dilated ducts in the retroareolar left breast are without change. There are no defined masses, areas of significant asymmetry, areas of architectural distortion or suspicious calcifications. Targeted left breast ultrasound is performed, showing mildly ectatic retroareolar ducts, with no intraductal debris or tissue. No mass. Ducts are stable to less prominent than on the prior studies. IMPRESSION: 1. No evidence of breast malignancy. 2. Benign retroareolar left breast duct ectasia. RECOMMENDATION: Screening mammogram in one year.(Code:SM-B-01Y) I have discussed the findings and recommendations with the patient. If applicable, a reminder letter will be sent to the patient regarding the next appointment. BI-RADS CATEGORY  2: Benign. Electronically Signed   By: Amie Portland M.D.   On: 11/26/2023 08:26    CODE STATUS:  Code Status History  Date Active Date Inactive Code Status Order ID Comments User Context   10/10/2023 1942 10/15/2023 1540 Full Code [id number]  Charlsie Quest, MD ED   11/12/2017 1638 11/17/2017 1853 Full Code [id number]  Esperanza Sheets, MD ED   06/27/2017 1007 07/03/2017 1735 Full Code [id number]  Bernadene Person, NP ED   04/09/2015 2033 04/13/2015 1554 Full Code [id number]  Osvaldo Shipper, MD Inpatient   03/29/2015 0300 04/01/2015 1411 Full Code [id number]  Delano Metz, MD Inpatient   12/03/2011 0340 12/10/2011 1759 Full Code [id number]  Deterding, Dorise Hiss, MD Inpatient   12/03/2011 0234 12/03/2011 0340 Full Code [id number]  Siqueiros Guinevere Ferrari, MD ED    Questions for Most Recent Historical Code Status (Order [id number])     Question Answer    By: Consent: discussion documented in EHR                Advance Directive Documentation    Flowsheet Row Most Recent Value  Type of Advance Directive Healthcare Power of Attorney, Living will  Pre-existing out of facility DNR order (yellow form or pink MOST form) --  "MOST" Form in Place? --       Orders Placed This Encounter  Procedures   APTT    Standing Status:   Future    Number of Occurrences:   1    Expiration Date:   12/16/2024   Protime-INR    Standing Status:   Future    Number of Occurrences:   1    Expiration Date:   12/16/2024   Fibrinogen    Standing Status:   Future    Number of Occurrences:   1    Expiration Date:   12/16/2024   Multiple Myeloma Panel (SPEP&IFE w/QIG)    Standing Status:   Future    Number of Occurrences:   1    Expiration Date:   12/16/2024   Kappa/lambda light chains    Standing Status:   Future    Number of Occurrences:   1    Expiration Date:   12/16/2024   Lactate dehydrogenase    Standing Status:   Future    Number of Occurrences:   1    Expiration Date:   12/16/2024   Uric acid    Standing Status:   Future    Number of Occurrences:   1    Expiration Date:   12/16/2024   Beta 2 microglobulin, serum    Standing Status:   Future    Number of Occurrences:   1    Expiration Date:   12/16/2024   Ambulatory referral to Radiation Oncology    Referral Priority:   Routine    Referral Type:   Consultation    Referral Reason:   Specialty Services Required    Requested Specialty:   Radiation Oncology    Number of Visits Requested:   1     Future Appointments  Date Time Provider Department Center  01/01/2024 10:00 AM [facility] PHLEBOTOMIST [facility] None  01/01/2024 10:30 AM Pollyann Samples, NP [facility] None  01/01/2024 11:00 AM [facility] INFUSION [facility] None  01/14/2024  8:15 AM [facility] PHLEBOTOMIST [facility] None  01/14/2024  8:45 AM Truc Winfree, MD [facility] None  01/14/2024  9:30 AM [facility] INFUSION [facility] None  02/11/2024  8:15  AM [facility] PHLEBOTOMIST [facility] None  02/11/2024  8:45 AM Ladale Sherburn, Archie Patten, MD [facility] None  02/11/2024  9:30 AM [facility] INFUSION [facility] None     This document was completed  utilizing speech recognition software. Grammatical errors, random word insertions, pronoun errors, and incomplete sentences are an occasional consequence of this system due to software limitations, ambient noise, and hardware issues. Any formal questions or concerns about the content, text or information contained within the body of this dictation should be directly addressed to the provider for clarification.

## 2023-12-17 NOTE — Progress Notes (Signed)
 Patient seen by Dr. Archie Patten Pasam today  Vitals are within treatment parameters:No (Please specify and give further instructions.) Dr. Arlana Pouch aware hypotension and pt is getting fluids with tx today. Patient having Post menopausal menses since Sunday. Labs are within treatment parameters: No AST 199, Creatinine of 2.8 Dr. Arlana Pouch aware, tx plan updated. Treatment plan has been signed: Yes   Per physician team, Patient is ready for treatment. Please note the following modifications:   "We will proceed with treatment today as scheduled. Can we please also give her 1 liter of NS over 2 hours."  Additional labs to be drawn after visit with Pasam is over.  *Note updated treatment plan.

## 2023-12-18 ENCOUNTER — Telehealth: Payer: Self-pay | Admitting: Radiation Oncology

## 2023-12-18 ENCOUNTER — Telehealth: Payer: Self-pay | Admitting: Emergency Medicine

## 2023-12-18 LAB — KAPPA/LAMBDA LIGHT CHAINS
Kappa free light chain: 459.5 mg/L — ABNORMAL HIGH (ref 3.3–19.4)
Kappa, lambda light chain ratio: 2.25 — ABNORMAL HIGH (ref 0.26–1.65)
Lambda free light chains: 204 mg/L — ABNORMAL HIGH (ref 5.7–26.3)

## 2023-12-18 LAB — BETA 2 MICROGLOBULIN, SERUM: Beta-2 Microglobulin: 7.8 mg/L — ABNORMAL HIGH (ref 0.6–2.4)

## 2023-12-18 NOTE — Telephone Encounter (Signed)
 24 HOUR CALLBACK  24 hour callback post 1st time Imjudo infusion. Patient denies any side effects or problems. Pt reported that radiation oncology called today to set up consultation. Pt advised to call with any questions or concerns.

## 2023-12-18 NOTE — Telephone Encounter (Signed)
 Called patient to schedule a consultation w. Dr. Mitzi Hansen. Patient requested appointment delay due to other appointments.

## 2023-12-19 LAB — MULTIPLE MYELOMA PANEL, SERUM
Albumin SerPl Elph-Mcnc: 1.8 g/dL — ABNORMAL LOW (ref 2.9–4.4)
Albumin/Glob SerPl: 0.3 — ABNORMAL LOW (ref 0.7–1.7)
Alpha 1: 0.1 g/dL (ref 0.0–0.4)
Alpha2 Glob SerPl Elph-Mcnc: 0.2 g/dL — ABNORMAL LOW (ref 0.4–1.0)
B-Globulin SerPl Elph-Mcnc: 1.3 g/dL (ref 0.7–1.3)
Gamma Glob SerPl Elph-Mcnc: 6.1 g/dL — ABNORMAL HIGH (ref 0.4–1.8)
Globulin, Total: 7.7 g/dL — ABNORMAL HIGH (ref 2.2–3.9)
IgA: 1480 mg/dL — ABNORMAL HIGH (ref 87–352)
IgG (Immunoglobin G), Serum: 6559 mg/dL — ABNORMAL HIGH (ref 586–1602)
IgM (Immunoglobulin M), Srm: 123 mg/dL (ref 26–217)
Total Protein ELP: 9.5 g/dL — ABNORMAL HIGH (ref 6.0–8.5)

## 2023-12-24 ENCOUNTER — Encounter: Payer: Self-pay | Admitting: Oncology

## 2023-12-24 ENCOUNTER — Ambulatory Visit: Admitting: Oncology

## 2023-12-24 DIAGNOSIS — C220 Liver cell carcinoma: Secondary | ICD-10-CM | POA: Diagnosis not present

## 2023-12-24 DIAGNOSIS — D684 Acquired coagulation factor deficiency: Secondary | ICD-10-CM

## 2023-12-24 DIAGNOSIS — C7971 Secondary malignant neoplasm of right adrenal gland: Secondary | ICD-10-CM

## 2023-12-24 DIAGNOSIS — N939 Abnormal uterine and vaginal bleeding, unspecified: Secondary | ICD-10-CM

## 2023-12-24 DIAGNOSIS — K759 Inflammatory liver disease, unspecified: Secondary | ICD-10-CM | POA: Diagnosis not present

## 2023-12-24 DIAGNOSIS — E278 Other specified disorders of adrenal gland: Secondary | ICD-10-CM

## 2023-12-24 MED ORDER — VITAMIN K (PHYTONADIONE) 100 MCG PO TABS: 1000.0000 ug | ORAL_TABLET | Freq: Every day | ORAL | 0 refills | Status: DC

## 2023-12-24 NOTE — Progress Notes (Signed)
 [facility]  HEMATOLOGY-ONCOLOGY ELECTRONIC VISIT PROGRESS NOTE  PATIENT NAME: Dana Thornton   MR#: [id number] DOB: 1956/04/30  DATE OF SERVICE: 12/24/2023  Patient Care Team: Ollen Bowl, MD as PCP - General (Internal Medicine) Charlott Rakes, MD as Consulting Physician (Gastroenterology) Jerrell Mylar Jacquelyne Balint, RN as Oncology Nurse Navigator  I connected with the patient via telephone conference and verified that I am speaking with the correct person using two identifiers. The patient's location is at home and I am providing care from the [facility].  I discussed the limitations, risks, security and privacy concerns of performing an evaluation and management service by e-visits and the availability of in person appointments.  I also discussed with the patient that there may be a patient responsible charge related to this service. The patient expressed understanding and agreed to proceed.   ASSESSMENT & PLAN:   Dana Thornton is a 68 y.o.  pleasant lady with a past medical history of autoimmune hepatitis, for which she was seeing gastroenterologist Dr. Bosie Clos, diabetes mellitus, hypertension, dyslipidemia, was referred to our clinic in February 2025 for newly diagnosed hepatocellular carcinoma, noted on MRI abdomen.   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 - Prescribe vitamin K 10 mg orally daily - Arrange for plasma infusion if vitamin K is ineffective - Schedule follow-up appointment on Monday, December 30, 2023 to assess response to treatment and coordinate potential FFP infusion  Abnormal uterine bleeding Persistent abnormal uterine bleeding with anatomical challenges for biopsy due to previous C-sections. Bleeding may be exacerbated by coagulopathy related to liver  dysfunction.  Adrenal lesion Adrenal lesion identified, with pending evaluation by the radiation department for potential radiation therapy. 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 Confirmed via MRI. Complicated by autoimmune hepatitis.  Her case was presented at GI tumor conference by another provider on 10/16/2023.  It was determined that she is not a candidate for resection.  Plan was to obtain PET/CT following which a referral for liver directed therapy including Y90.  Also radiation oncology referral was to be considered for possible SBRT to adrenal lesion.  PET scan performed on 10/25/2023.  It did not show increased FDG activity in the liver lesion or in the adrenal lesion.  Her case was discussed again in tumor conference on 11/06/2023.  Recommendation made to refer her for possible liver transplant evaluation.  [facility] transplantation [facility] at [facility] reviewed her imaging studies and are concerned about clinically picture indicating metastatic disease to adrenal gland.  She was determined to be not a candidate for liver transplant.  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   24-hour urine metanephrine analysis on 11/19/2023 showed 176 mcg of metanephrines in 24-hour urine, which is within normal range, not indicative of pheochromocytoma.   Plan made for systemic treatment with tremelimumab plus durvalumab.  Started from 11/19/2023.  Her history of autoimmune hepatitis could complicate immunotherapy and we will closely monitor her for this.  She was referred to interventional radiology department regarding possibility of Y90 or other liver directed treatments.  Appointment pending.  Proceeded with cycle 2 of immunotherapy with durvalumab on 12/17/2023 and this will be continued every 4 weeks.  We will discuss with radiation oncology  department for possible SBRT to the adrenal lesion, now that it looks like a metastatic lesion and not pheochromocytoma.  Labs did show evidence of coagulopathy on 12/17/2023, likely from hepatocellular carcinoma and her history of autoimmune hepatitis.  However she is not showing any symptoms suggestive of DIC or acute coagulopathy.   Started on vitamin K as mentioned above  Next treatment will be due in 2 weeks from now.   I discussed the assessment and treatment plan with the patient. The patient was provided an opportunity to ask questions and all were answered. The patient agreed with the plan and demonstrated an understanding of the instructions. The patient was advised to call back or seek an in-person evaluation if the symptoms worsen or if the condition fails to improve as anticipated.    I spent 11 minutes over the phone with the patient reviewing test results, discuss management and coordination/planning of care.  Arlo Berber, MD 12/24/2023 5:52 PM [facility] - A DEPT OF [facility] [address] [place] [address] Dept: [phone] Dept Fax: [phone]   INTERVAL HISTORY:  Please see above for problem oriented charting.  The purpose of today's discussion is to discuss ongoing postmenopausal bleeding issues.  Discussed the use of AI scribe software for clinical note transcription with the patient, who gave verbal consent to proceed.  History of Present Illness Dana Thornton is a 68 year old female with liver cancer and hepatitis who presents with ongoing uterine bleeding and clotting issues. She was referred by Dr. Cecille Coffin from [facility] for further evaluation of uterine bleeding.  She has been experiencing ongoing uterine bleeding, which has been difficult to manage due to complications from previous C-section deliveries. Attempts to perform a uterine biopsy were unsuccessful due  to these anatomical challenges.  Her history of liver cancer and hepatitis is contributing to her current issues with blood clotting. Recent blood tests have indicated abnormal clotting mechanisms, likely related to liver dysfunction. She confirms ongoing bleeding issues.   SUMMARY OF ONCOLOGIC HISTORY:  She presented to the ED on 10/10/2023 due to abdominal pain that was radiating to her back and abnormal findings on the CT scan that was ordered by her primary physician.  CT of abdomen pelvis on 10/10/2023 showed heterogeneously enhancing 4 x 4 cm right hepatic lobe mass in segment 6, highly suspicious for hepatoma.  MRI recommended for further evaluation.  Also noted was heterogeneously enhancing 3.8 x 2.2 cm right adrenal gland mass concerning for metastatic disease.  Therefore oncology consult was requested when the patient was hospitalized.  Patient was evaluated by Dr. Arno Bibles.   AFP was significantly elevated at 458 ng/mL on 10/10/2023.   Medical history includes hypertension, diabetes, history of gallstones and cirrhosis for which she sees her primary GI Dr. Honey Lusty.   Patient has underlying history of cirrhosis however never has been decompensated and did not require paracentesis, variceal ligation or had history of encephalopathy.  She follows up with Dr. Honey Lusty in gastroenterology.   On 10/13/2023, MRI abdomen with and without contrast was obtained for further evaluation.  Examination was limited by breath motion artifact.  There was subtly hyperenhancing mass of inferior right lobe of liver, hepatic segment 6 with clear evidence of washout but without capsular enhancement, measuring 3.4 x 3.3 cm.  This was consistent with hepatocellular carcinoma, LI-RADS category 5.  Also noted was heterogeneously enhancing mass of the right adrenal gland measuring 4.1  x 2.3 cm, new compared to March 2019 and highly concerning for malignancy.  Isolated adrenal metastatic hepatocellular carcinoma is rare but  has been reported in the literature.  Cirrhosis and small volume ascites was noted.  Anasarca noted.   On 10/14/2023, CT chest without contrast showed no evidence of intrathoracic metastatic disease.   Her case was presented at GI tumor conference by Dr. Arno Bibles on 10/16/2023.  It was determined that she is not a candidate for resection.  Plan was to obtain PET/CT following which a referral for liver directed therapy including Y90.  Also radiation oncology referral was to be considered for possible SBRT to adrenal lesion.   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    24-hour urine metanephrine analysis on 11/19/2023 showed 176 mcg of metanephrines in 24-hour urine, which is within normal range, not indicative of pheochromocytoma.   She was determined to be not a candidate for liver transplant because of the adrenal lesion.   Plan made for systemic treatment with tremelimumab plus durvalumab.  Started from 11/19/2023.     Her history of autoimmune hepatitis could complicate immunotherapy and we will closely monitor her for this.  Oncology History  Hepatocellular carcinoma (HCC)  10/29/2023 Initial Diagnosis   Hepatocellular carcinoma (HCC)   10/29/2023 Cancer Staging   Staging form: Liver, AJCC 8th Edition - Clinical: Stage IVB (cT1b, cN0, cM1) - Signed by Arlo Berber, MD on 10/29/2023 Stage prefix: Initial diagnosis   11/19/2023 -  Chemotherapy   Patient is on Treatment Plan : Hepatocellular Carcinoma Tremelimumab-actl C1 D1 + Durvalumab q28d        REVIEW OF SYSTEMS:    Review of Systems - Oncology  All other pertinent systems were reviewed with the patient and are negative.  I have reviewed the past medical history, past surgical history, social history and family history with the patient and they are unchanged from previous note.  ALLERGIES:  She is allergic to ace inhibitors, codeine, and statins.  MEDICATIONS:  Current Outpatient Medications  Medication Sig Dispense  Refill   Vitamin K, Phytonadione, 100 MCG TABS Take 10 tablets (1,000 mcg total) by mouth daily at 12 noon. 50 tablet 0   ALEVE 220 MG tablet Take 220-440 mg by mouth 2 (two) times daily as needed (for pain or headaches).     amLODipine (NORVASC) 10 MG tablet Take 1 tablet (10 mg total) by mouth daily. 30 tablet 1   carvedilol (COREG) 25 MG tablet Take 25 mg by mouth 2 (two) times daily with a meal.     ezetimibe (ZETIA) 10 MG tablet Take 10 mg by mouth daily.     furosemide (LASIX) 40 MG tablet Take 40 mg by mouth daily as needed for fluid or edema.     insulin glargine (LANTUS) 100 UNIT/ML injection 30  Units sq Qhs and 12 units q am (Patient not taking: Reported on 11/12/2023) 10 mL 2   insulin lispro (HUMALOG KWIKPEN) 100 UNIT/ML KwikPen Inject 10 Units into the skin 3 (three) times daily with meals as needed (AS DIRECTED FOR AN ELEVATED BGL).     levothyroxine (SYNTHROID) 300 MCG tablet Take 150-300 mcg by mouth See admin instructions. Take 150 mcg by mouth 30 minutes before breakfast on Sunday and Saturday and 300 mcg on Mon/Tues/Wed/Thurs/Fri     losartan (COZAAR) 100 MG tablet Take 100 mg by mouth daily.     mercaptopurine (PURINETHOL) 50 MG tablet Take 50 mg by mouth daily. Give on  an empty stomach 1 hour before or 2 hours after meals. Caution: Chemotherapy.     metroNIDAZOLE (FLAGYL) 500 MG tablet Take 1 tablet (500 mg total) by mouth 3 (three) times daily. 42 tablet 0   ondansetron (ZOFRAN) 4 MG tablet Take 4 mg by mouth every 6 (six) hours as needed for nausea or vomiting.     ondansetron (ZOFRAN) 8 MG tablet Take 1 tablet (8 mg total) by mouth every 8 (eight) hours as needed for nausea or vomiting. 30 tablet 1   predniSONE (DELTASONE) 10 MG tablet Take 2 tablets (20 mg) daily with food for 4 days, then 1 tablet (10 mg) daily with food until seen by your GI doctor (Patient taking differently: Take 10 mg by mouth daily with breakfast.) 30 tablet 1   prochlorperazine (COMPAZINE) 10 MG tablet  Take 1 tablet (10 mg total) by mouth every 6 (six) hours as needed for nausea or vomiting. 30 tablet 1   triamcinolone cream (KENALOG) 0.1 % Apply 1 Application topically 2 (two) times daily as needed (for irritation, as directed- affected area).     TRULICITY 0.75 MG/0.5ML SOAJ Inject 0.75 mg into the skin every Sunday.     Vitamin D, Ergocalciferol, (DRISDOL) 50000 units CAPS capsule Take 50,000 Units by mouth every Sunday.     No current facility-administered medications for this visit.    PHYSICAL EXAMINATION:    Onc Performance Status - 12/24/23 1700       ECOG Perf Status   ECOG Perf Status Ambulatory and capable of all selfcare but unable to carry out any work activities.  Up and about more than 50% of waking hours      KPS SCALE   KPS % SCORE Normal activity with effort, some s/s of disease             LABORATORY DATA:   I have reviewed the data as listed.  Recent Results (from the past 2160 hours)  I-STAT creatinine     Status: Abnormal   Collection Time: 10/10/23  8:30 AM  Result Value Ref Range   Creatinine, Ser 2.10 (H) 0.44 - 1.00 mg/dL  CBC with Differential     Status: Abnormal   Collection Time: 10/10/23  4:42 PM  Result Value Ref Range   WBC 16.4 (H) 4.0 - 10.5 K/uL   RBC 3.65 (L) 3.87 - 5.11 MIL/uL   Hemoglobin 11.4 (L) 12.0 - 15.0 g/dL   HCT 03.4 (L) 74.2 - 59.5 %   MCV 87.4 80.0 - 100.0 fL   MCH 31.2 26.0 - 34.0 pg   MCHC 35.7 30.0 - 36.0 g/dL   RDW 63.8 (H) 75.6 - 43.3 %   Platelets 145 (L) 150 - 400 K/uL   nRBC 0.1 0.0 - 0.2 %   Neutrophils Relative % 71 %   Neutro Abs 11.8 (H) 1.7 - 7.7 K/uL   Lymphocytes Relative 17 %   Lymphs Abs 2.7 0.7 - 4.0 K/uL   Monocytes Relative 8 %   Monocytes Absolute 1.3 (H) 0.1 - 1.0 K/uL   Eosinophils Relative 2 %   Eosinophils Absolute 0.2 0.0 - 0.5 K/uL   Basophils Relative 1 %   Basophils Absolute 0.1 0.0 - 0.1 K/uL   Immature Granulocytes 1 %   Abs Immature Granulocytes 0.22 (H) 0.00 - 0.07 K/uL     Comment: Performed at [facility], [address], [place], [place] [address]  Comprehensive metabolic panel     Status: Abnormal  Collection Time: 10/10/23  4:42 PM  Result Value Ref Range   Sodium 131 (L) 135 - 145 mmol/L   Potassium 4.6 3.5 - 5.1 mmol/L    Comment: HEMOLYSIS AT THIS LEVEL MAY AFFECT RESULT   Chloride 105 98 - 111 mmol/L   CO2 21 (L) 22 - 32 mmol/L   Glucose, Bld 89 70 - 99 mg/dL    Comment: Glucose reference range applies only to samples taken after fasting for at least 8 hours.   BUN 25 (H) 8 - 23 mg/dL   Creatinine, Ser 1.61 (H) 0.44 - 1.00 mg/dL   Calcium 7.6 (L) 8.9 - 10.3 mg/dL   Total Protein 9.3 (H) 6.5 - 8.1 g/dL   Albumin <0.9 (L) 3.5 - 5.0 g/dL   AST 604 (H) 15 - 41 U/L    Comment: HEMOLYSIS AT THIS LEVEL MAY AFFECT RESULT   ALT 109 (H) 0 - 44 U/L    Comment: HEMOLYSIS AT THIS LEVEL MAY AFFECT RESULT   Alkaline Phosphatase 112 38 - 126 U/L   Total Bilirubin 3.7 (H) 0.0 - 1.2 mg/dL    Comment: HEMOLYSIS AT THIS LEVEL MAY AFFECT RESULT   GFR, Estimated 26 (L) >60 mL/min    Comment: (NOTE) Calculated using the CKD-EPI Creatinine Equation (2021)    Anion gap 5 5 - 15    Comment: Performed at [facility], [address] [place], [place] [address]  Lipase, blood     Status: None   Collection Time: 10/10/23  4:42 PM  Result Value Ref Range   Lipase 33 11 - 51 U/L    Comment: Performed at [facility], [address], [place], [place] [address]  Protime-INR     Status: Abnormal   Collection Time: 10/10/23  4:42 PM  Result Value Ref Range   Prothrombin Time 23.7 (H) 11.4 - 15.2 seconds   INR 2.1 (H) 0.8 - 1.2    Comment: (NOTE) INR goal varies based on device and disease states. Performed at [facility], [address] [place], [place] [address]   Urinalysis, Routine w reflex microscopic -Urine, Clean Catch     Status: Abnormal   Collection Time: 10/10/23  4:49 PM   Result Value Ref Range   Color, Urine AMBER (A) YELLOW    Comment: BIOCHEMICALS MAY BE AFFECTED BY COLOR   APPearance HAZY (A) CLEAR   Specific Gravity, Urine 1.045 (H) 1.005 - 1.030   pH 5.0 5.0 - 8.0   Glucose, UA NEGATIVE NEGATIVE mg/dL   Hgb urine dipstick NEGATIVE NEGATIVE   Bilirubin Urine SMALL (A) NEGATIVE   Ketones, ur NEGATIVE NEGATIVE mg/dL   Protein, ur NEGATIVE NEGATIVE mg/dL   Nitrite NEGATIVE NEGATIVE   Leukocytes,Ua NEGATIVE NEGATIVE    Comment: Performed at [facility], [address] [place], [place] [address]  I-Stat CG4 Lactic Acid     Status: Abnormal   Collection Time: 10/10/23  5:21 PM  Result Value Ref Range   Lactic Acid, Venous 2.0 (HH) 0.5 - 1.9 mmol/L  I-Stat CG4 Lactic Acid     Status: Abnormal   Collection Time: 10/10/23  7:21 PM  Result Value Ref Range   Lactic Acid, Venous 2.4 (HH) 0.5 - 1.9 mmol/L   Comment NOTIFIED PHYSICIAN   CBG monitoring, ED     Status: None   Collection Time: 10/10/23  7:21 PM  Result Value Ref Range   Glucose-Capillary 71 70 - 99 mg/dL  Comment: Glucose reference range applies only to samples taken after fasting for at least 8 hours.  AFP tumor marker     Status: Abnormal   Collection Time: 10/10/23  9:11 PM  Result Value Ref Range   AFP, Serum, Tumor Marker 458.0 (H) 0.0 - 9.2 ng/mL    Comment: (NOTE) Roche [employer] Electrochemiluminescence Immunoassay (ECLIA) Values obtained with different assay methods or kits cannot be used interchangeably.  Results cannot be interpreted as absolute evidence of the presence or absence of malignant disease. This test is not interpretable in pregnant females. Performed At: [facility] [address] [place], [place] [id number] Pearlean Botts MD WG:[id number]   Glucose, capillary     Status: None   Collection Time: 10/10/23 11:46 PM  Result Value Ref Range   Glucose-Capillary 91 70 - 99 mg/dL    Comment: Glucose reference range applies only to  samples taken after fasting for at least 8 hours.  HIV Antibody (routine testing w rflx)     Status: None   Collection Time: 10/11/23  3:15 AM  Result Value Ref Range   HIV Screen 4th Generation wRfx Non Reactive Non Reactive    Comment: Performed at [facility], [address], [place], [place] [address]  Comprehensive metabolic panel     Status: Abnormal   Collection Time: 10/11/23  3:15 AM  Result Value Ref Range   Sodium 129 (L) 135 - 145 mmol/L   Potassium 4.6 3.5 - 5.1 mmol/L   Chloride 105 98 - 111 mmol/L   CO2 21 (L) 22 - 32 mmol/L   Glucose, Bld 80 70 - 99 mg/dL    Comment: Glucose reference range applies only to samples taken after fasting for at least 8 hours.   BUN 25 (H) 8 - 23 mg/dL   Creatinine, Ser 6.29 (H) 0.44 - 1.00 mg/dL   Calcium 7.3 (L) 8.9 - 10.3 mg/dL   Total Protein 8.2 (H) 6.5 - 8.1 g/dL   Albumin <5.2 (L) 3.5 - 5.0 g/dL   AST 841 (H) 15 - 41 U/L   ALT 93 (H) 0 - 44 U/L   Alkaline Phosphatase 113 38 - 126 U/L   Total Bilirubin 3.4 (H) 0.0 - 1.2 mg/dL   GFR, Estimated 24 (L) >60 mL/min    Comment: (NOTE) Calculated using the CKD-EPI Creatinine Equation (2021)    Anion gap 3 (L) 5 - 15    Comment: Performed at [facility], [address], [place], [place] [address]  CBC     Status: Abnormal   Collection Time: 10/11/23  3:15 AM  Result Value Ref Range   WBC 14.0 (H) 4.0 - 10.5 K/uL   RBC 3.15 (L) 3.87 - 5.11 MIL/uL   Hemoglobin 9.8 (L) 12.0 - 15.0 g/dL   HCT 10.2 (L) 72.5 - 36.6 %   MCV 88.9 80.0 - 100.0 fL   MCH 31.1 26.0 - 34.0 pg   MCHC 35.0 30.0 - 36.0 g/dL   RDW 44.0 (H) 34.7 - 42.5 %   Platelets 123 (L) 150 - 400 K/uL    Comment: REPEATED TO VERIFY   nRBC 0.0 0.0 - 0.2 %    Comment: Performed at [facility], [address], [place], [place] [address]  Protime-INR     Status: Abnormal   Collection Time: 10/11/23  3:15 AM  Result Value Ref Range   Prothrombin Time 24.9 (H) 11.4 - 15.2 seconds    INR 2.2 (H)  0.8 - 1.2    Comment: (NOTE) INR goal varies based on device and disease states. Performed at [facility], [address], [place], [place] [address]   Glucose, capillary     Status: None   Collection Time: 10/11/23  3:27 AM  Result Value Ref Range   Glucose-Capillary 76 70 - 99 mg/dL    Comment: Glucose reference range applies only to samples taken after fasting for at least 8 hours.  Glucose, capillary     Status: Abnormal   Collection Time: 10/11/23  7:47 AM  Result Value Ref Range   Glucose-Capillary 69 (L) 70 - 99 mg/dL    Comment: Glucose reference range applies only to samples taken after fasting for at least 8 hours.  Glucose, capillary     Status: Abnormal   Collection Time: 10/11/23  8:31 AM  Result Value Ref Range   Glucose-Capillary 118 (H) 70 - 99 mg/dL    Comment: Glucose reference range applies only to samples taken after fasting for at least 8 hours.  Ammonia     Status: None   Collection Time: 10/11/23 11:23 AM  Result Value Ref Range   Ammonia 30 9 - 35 umol/L    Comment: Performed at [facility], [address], [place], [place] [address]  Glucose, capillary     Status: None   Collection Time: 10/11/23 12:13 PM  Result Value Ref Range   Glucose-Capillary 99 70 - 99 mg/dL    Comment: Glucose reference range applies only to samples taken after fasting for at least 8 hours.  Glucose, capillary     Status: None   Collection Time: 10/11/23  4:34 PM  Result Value Ref Range   Glucose-Capillary 96 70 - 99 mg/dL    Comment: Glucose reference range applies only to samples taken after fasting for at least 8 hours.  Glucose, capillary     Status: None   Collection Time: 10/11/23  7:52 PM  Result Value Ref Range   Glucose-Capillary 82 70 - 99 mg/dL    Comment: Glucose reference range applies only to samples taken after fasting for at least 8 hours.  Glucose, capillary     Status: Abnormal   Collection Time: 10/11/23  11:40 PM  Result Value Ref Range   Glucose-Capillary 68 (L) 70 - 99 mg/dL    Comment: Glucose reference range applies only to samples taken after fasting for at least 8 hours.  Glucose, capillary     Status: Abnormal   Collection Time: 10/12/23  1:02 AM  Result Value Ref Range   Glucose-Capillary 136 (H) 70 - 99 mg/dL    Comment: Glucose reference range applies only to samples taken after fasting for at least 8 hours.  CBC     Status: Abnormal   Collection Time: 10/12/23  3:09 AM  Result Value Ref Range   WBC 9.4 4.0 - 10.5 K/uL   RBC 2.71 (L) 3.87 - 5.11 MIL/uL   Hemoglobin 8.6 (L) 12.0 - 15.0 g/dL   HCT 55.7 (L) 32.2 - 02.5 %   MCV 91.1 80.0 - 100.0 fL   MCH 31.7 26.0 - 34.0 pg   MCHC 34.8 30.0 - 36.0 g/dL   RDW 42.7 (H) 06.2 - 37.6 %   Platelets 93 (L) 150 - 400 K/uL    Comment: Immature Platelet Fraction may be clinically indicated, consider ordering this additional test [id number] CONSISTENT WITH PREVIOUS RESULT REPEATED TO VERIFY    nRBC 0.0 0.0 -  0.2 %    Comment: Performed at [facility], [address] [place], [place] [address]  Comprehensive metabolic panel     Status: Abnormal   Collection Time: 10/12/23  3:09 AM  Result Value Ref Range   Sodium 135 135 - 145 mmol/L   Potassium 4.1 3.5 - 5.1 mmol/L   Chloride 108 98 - 111 mmol/L   CO2 20 (L) 22 - 32 mmol/L   Glucose, Bld 101 (H) 70 - 99 mg/dL    Comment: Glucose reference range applies only to samples taken after fasting for at least 8 hours.   BUN 30 (H) 8 - 23 mg/dL   Creatinine, Ser 6.04 (H) 0.44 - 1.00 mg/dL   Calcium 7.6 (L) 8.9 - 10.3 mg/dL   Total Protein 7.5 6.5 - 8.1 g/dL   Albumin 2.1 (L) 3.5 - 5.0 g/dL   AST 540 (H) 15 - 41 U/L   ALT 87 (H) 0 - 44 U/L   Alkaline Phosphatase 72 38 - 126 U/L   Total Bilirubin 3.3 (H) 0.0 - 1.2 mg/dL   GFR, Estimated 19 (L) >60 mL/min    Comment: (NOTE) Calculated using the CKD-EPI Creatinine Equation (2021)    Anion gap 7 5 - 15    Comment:  Performed at [facility], [address], [place], [place] [address]  Phosphorus     Status: None   Collection Time: 10/12/23  3:09 AM  Result Value Ref Range   Phosphorus 4.1 2.5 - 4.6 mg/dL    Comment: Performed at [facility], [address] [place], [place] [address]  Magnesium     Status: None   Collection Time: 10/12/23  3:09 AM  Result Value Ref Range   Magnesium 2.0 1.7 - 2.4 mg/dL    Comment: Performed at [facility], [address], [place], [place] [address]  Glucose, capillary     Status: Abnormal   Collection Time: 10/12/23  3:43 AM  Result Value Ref Range   Glucose-Capillary 100 (H) 70 - 99 mg/dL    Comment: Glucose reference range applies only to samples taken after fasting for at least 8 hours.  Glucose, capillary     Status: None   Collection Time: 10/12/23  7:46 AM  Result Value Ref Range   Glucose-Capillary 74 70 - 99 mg/dL    Comment: Glucose reference range applies only to samples taken after fasting for at least 8 hours.  Glucose, capillary     Status: None   Collection Time: 10/12/23 11:50 AM  Result Value Ref Range   Glucose-Capillary 91 70 - 99 mg/dL    Comment: Glucose reference range applies only to samples taken after fasting for at least 8 hours.  Glucose, capillary     Status: None   Collection Time: 10/12/23  4:07 PM  Result Value Ref Range   Glucose-Capillary 88 70 - 99 mg/dL    Comment: Glucose reference range applies only to samples taken after fasting for at least 8 hours.  Glucose, capillary     Status: None   Collection Time: 10/12/23  5:37 PM  Result Value Ref Range   Glucose-Capillary 70 70 - 99 mg/dL    Comment: Glucose reference range applies only to samples taken after fasting for at least 8 hours.  Glucose, capillary     Status: Abnormal   Collection Time: 10/12/23  6:04 PM  Result Value Ref Range   Glucose-Capillary 118 (H) 70 -  99 mg/dL    Comment: Glucose reference range  applies only to samples taken after fasting for at least 8 hours.  Glucose, capillary     Status: Abnormal   Collection Time: 10/12/23 11:29 PM  Result Value Ref Range   Glucose-Capillary 100 (H) 70 - 99 mg/dL    Comment: Glucose reference range applies only to samples taken after fasting for at least 8 hours.  CBC     Status: Abnormal   Collection Time: 10/13/23  3:19 AM  Result Value Ref Range   WBC 7.5 4.0 - 10.5 K/uL   RBC 2.68 (L) 3.87 - 5.11 MIL/uL   Hemoglobin 8.4 (L) 12.0 - 15.0 g/dL   HCT 16.1 (L) 09.6 - 04.5 %   MCV 89.6 80.0 - 100.0 fL   MCH 31.3 26.0 - 34.0 pg   MCHC 35.0 30.0 - 36.0 g/dL   RDW 40.9 (H) 81.1 - 91.4 %   Platelets 88 (L) 150 - 400 K/uL    Comment: Immature Platelet Fraction may be clinically indicated, consider ordering this additional test [id number] CONSISTENT WITH PREVIOUS RESULT REPEATED TO VERIFY    nRBC 0.3 (H) 0.0 - 0.2 %    Comment: Performed at [facility], [address], [place], [place] [address]  Comprehensive metabolic panel     Status: Abnormal   Collection Time: 10/13/23  3:19 AM  Result Value Ref Range   Sodium 137 135 - 145 mmol/L   Potassium 4.2 3.5 - 5.1 mmol/L   Chloride 110 98 - 111 mmol/L   CO2 19 (L) 22 - 32 mmol/L   Glucose, Bld 88 70 - 99 mg/dL    Comment: Glucose reference range applies only to samples taken after fasting for at least 8 hours.   BUN 24 (H) 8 - 23 mg/dL   Creatinine, Ser 7.84 (H) 0.44 - 1.00 mg/dL   Calcium 7.7 (L) 8.9 - 10.3 mg/dL   Total Protein 6.9 6.5 - 8.1 g/dL   Albumin 2.6 (L) 3.5 - 5.0 g/dL   AST 696 (H) 15 - 41 U/L   ALT 68 (H) 0 - 44 U/L   Alkaline Phosphatase 60 38 - 126 U/L   Total Bilirubin 3.4 (H) 0.0 - 1.2 mg/dL   GFR, Estimated 23 (L) >60 mL/min    Comment: (NOTE) Calculated using the CKD-EPI Creatinine Equation (2021)    Anion gap 8 5 - 15    Comment: Performed at [facility] [address], [place], [place] [address]  Magnesium     Status:  None   Collection Time: 10/13/23  3:19 AM  Result Value Ref Range   Magnesium 1.9 1.7 - 2.4 mg/dL    Comment: Performed at [facility], [address], [place], [place] [address]  Glucose, capillary     Status: None   Collection Time: 10/13/23  5:03 AM  Result Value Ref Range   Glucose-Capillary 90 70 - 99 mg/dL    Comment: Glucose reference range applies only to samples taken after fasting for at least 8 hours.  Glucose, capillary     Status: Abnormal   Collection Time: 10/13/23  7:54 AM  Result Value Ref Range   Glucose-Capillary 102 (H) 70 - 99 mg/dL    Comment: Glucose reference range applies only to samples taken after fasting for at least 8 hours.  Glucose, capillary     Status: Abnormal   Collection Time: 10/13/23 11:15 AM  Result Value Ref Range  Glucose-Capillary 153 (H) 70 - 99 mg/dL    Comment: Glucose reference range applies only to samples taken after fasting for at least 8 hours.  Glucose, capillary     Status: Abnormal   Collection Time: 10/13/23  4:06 PM  Result Value Ref Range   Glucose-Capillary 143 (H) 70 - 99 mg/dL    Comment: Glucose reference range applies only to samples taken after fasting for at least 8 hours.  Glucose, capillary     Status: Abnormal   Collection Time: 10/13/23  8:16 PM  Result Value Ref Range   Glucose-Capillary 150 (H) 70 - 99 mg/dL    Comment: Glucose reference range applies only to samples taken after fasting for at least 8 hours.  CBC     Status: Abnormal   Collection Time: 10/14/23  3:03 AM  Result Value Ref Range   WBC 7.6 4.0 - 10.5 K/uL   RBC 2.70 (L) 3.87 - 5.11 MIL/uL   Hemoglobin 8.4 (L) 12.0 - 15.0 g/dL   HCT 60.4 (L) 54.0 - 98.1 %   MCV 88.9 80.0 - 100.0 fL   MCH 31.1 26.0 - 34.0 pg   MCHC 35.0 30.0 - 36.0 g/dL   RDW 19.1 (H) 47.8 - 29.5 %   Platelets 90 (L) 150 - 400 K/uL    Comment: Immature Platelet Fraction may be clinically indicated, consider ordering this additional  test [id number] CONSISTENT WITH PREVIOUS RESULT REPEATED TO VERIFY    nRBC 0.7 (H) 0.0 - 0.2 %    Comment: Performed at [facility], [address] [place], [place] [address]  Comprehensive metabolic panel     Status: Abnormal   Collection Time: 10/14/23  3:03 AM  Result Value Ref Range   Sodium 137 135 - 145 mmol/L   Potassium 4.1 3.5 - 5.1 mmol/L   Chloride 113 (H) 98 - 111 mmol/L   CO2 20 (L) 22 - 32 mmol/L   Glucose, Bld 104 (H) 70 - 99 mg/dL    Comment: Glucose reference range applies only to samples taken after fasting for at least 8 hours.   BUN 18 8 - 23 mg/dL   Creatinine, Ser 6.29 (H) 0.44 - 1.00 mg/dL   Calcium 7.6 (L) 8.9 - 10.3 mg/dL   Total Protein 7.1 6.5 - 8.1 g/dL   Albumin 2.4 (L) 3.5 - 5.0 g/dL   AST 97 (H) 15 - 41 U/L   ALT 64 (H) 0 - 44 U/L   Alkaline Phosphatase 57 38 - 126 U/L   Total Bilirubin 3.0 (H) 0.0 - 1.2 mg/dL   GFR, Estimated 27 (L) >60 mL/min    Comment: (NOTE) Calculated using the CKD-EPI Creatinine Equation (2021)    Anion gap 4 (L) 5 - 15    Comment: Performed at [facility], [address], [place], [place] [address]  Magnesium     Status: None   Collection Time: 10/14/23  3:03 AM  Result Value Ref Range   Magnesium 1.9 1.7 - 2.4 mg/dL    Comment: Performed at [facility], [address], [place], [place] [address]  Glucose, capillary     Status: Abnormal   Collection Time: 10/14/23  4:54 AM  Result Value Ref Range   Glucose-Capillary 100 (H) 70 - 99 mg/dL    Comment: Glucose reference range applies only to samples taken after fasting for at least 8 hours.  Glucose, capillary     Status: Abnormal   Collection Time:  10/14/23  7:22 AM  Result Value Ref Range   Glucose-Capillary 127 (H) 70 - 99 mg/dL    Comment: Glucose reference range applies only to samples taken after fasting for at least 8 hours.  Glucose, capillary     Status: Abnormal   Collection Time: 10/14/23 11:37 AM   Result Value Ref Range   Glucose-Capillary 117 (H) 70 - 99 mg/dL    Comment: Glucose reference range applies only to samples taken after fasting for at least 8 hours.  Glucose, capillary     Status: Abnormal   Collection Time: 10/14/23  5:17 PM  Result Value Ref Range   Glucose-Capillary 140 (H) 70 - 99 mg/dL    Comment: Glucose reference range applies only to samples taken after fasting for at least 8 hours.  Glucose, capillary     Status: Abnormal   Collection Time: 10/14/23  9:51 PM  Result Value Ref Range   Glucose-Capillary 107 (H) 70 - 99 mg/dL    Comment: Glucose reference range applies only to samples taken after fasting for at least 8 hours.  CBC     Status: Abnormal   Collection Time: 10/15/23  3:29 AM  Result Value Ref Range   WBC 7.7 4.0 - 10.5 K/uL   RBC 2.76 (L) 3.87 - 5.11 MIL/uL   Hemoglobin 8.5 (L) 12.0 - 15.0 g/dL   HCT 16.1 (L) 09.6 - 04.5 %   MCV 90.6 80.0 - 100.0 fL   MCH 30.8 26.0 - 34.0 pg   MCHC 34.0 30.0 - 36.0 g/dL   RDW 40.9 (H) 81.1 - 91.4 %   Platelets 82 (L) 150 - 400 K/uL    Comment: Immature Platelet Fraction may be clinically indicated, consider ordering this additional test [id number] CONSISTENT WITH PREVIOUS RESULT    nRBC 0.0 0.0 - 0.2 %    Comment: Performed at [facility], [address] [place], [place] [address]  Comprehensive metabolic panel     Status: Abnormal   Collection Time: 10/15/23  3:29 AM  Result Value Ref Range   Sodium 138 135 - 145 mmol/L   Potassium 4.1 3.5 - 5.1 mmol/L   Chloride 111 98 - 111 mmol/L   CO2 19 (L) 22 - 32 mmol/L   Glucose, Bld 83 70 - 99 mg/dL    Comment: Glucose reference range applies only to samples taken after fasting for at least 8 hours.   BUN 12 8 - 23 mg/dL   Creatinine, Ser 7.84 (H) 0.44 - 1.00 mg/dL   Calcium 7.7 (L) 8.9 - 10.3 mg/dL   Total Protein 7.0 6.5 - 8.1 g/dL   Albumin 2.2 (L) 3.5 - 5.0 g/dL   AST 95 (H) 15 - 41 U/L   ALT 61 (H) 0 - 44 U/L   Alkaline  Phosphatase 59 38 - 126 U/L   Total Bilirubin 2.8 (H) 0.0 - 1.2 mg/dL   GFR, Estimated 37 (L) >60 mL/min    Comment: (NOTE) Calculated using the CKD-EPI Creatinine Equation (2021)    Anion gap 8 5 - 15    Comment: Performed at [facility], [address], [place], [place] [address]  Magnesium     Status: None   Collection Time: 10/15/23  3:29 AM  Result Value Ref Range   Magnesium 1.8 1.7 - 2.4 mg/dL    Comment: Performed at [facility], [address] [place], [place] [address]  Glucose, capillary     Status: None  Collection Time: 10/15/23  4:16 AM  Result Value Ref Range   Glucose-Capillary 97 70 - 99 mg/dL    Comment: Glucose reference range applies only to samples taken after fasting for at least 8 hours.  Glucose, capillary     Status: Abnormal   Collection Time: 10/15/23  7:38 AM  Result Value Ref Range   Glucose-Capillary 100 (H) 70 - 99 mg/dL    Comment: Glucose reference range applies only to samples taken after fasting for at least 8 hours.  Glucose, capillary     Status: None   Collection Time: 10/25/23  7:41 AM  Result Value Ref Range   Glucose-Capillary 88 70 - 99 mg/dL    Comment: Glucose reference range applies only to samples taken after fasting for at least 8 hours.  Direct antiglobulin test (not at [facility])     Status: None   Collection Time: 10/29/23 10:00 AM  Result Value Ref Range   DAT, complement NEG    DAT, IgG      NEG Performed at [facility], [address], [place], [place] [address]   Haptoglobin     Status: Abnormal   Collection Time: 10/29/23 10:00 AM  Result Value Ref Range   Haptoglobin <10 (L) 37 - 355 mg/dL    Comment: (NOTE) Performed At: [facility] [address] [place], [place] [id number] Jolene Schimke MD [id number]   Folate     Status: None   Collection Time: 10/29/23 10:00 AM  Result Value Ref Range   Folate 7.4 >5.9 ng/mL    Comment: Performed at [facility], [address], [place], [place] [address]  Vitamin B12     Status: Abnormal   Collection Time: 10/29/23 10:00 AM  Result Value Ref Range   Vitamin B-12 1,403 (H) 180 - 914 pg/mL    Comment: (NOTE) This assay is not validated for testing neonatal or myeloproliferative syndrome specimens for Vitamin B12 levels. Performed at [facility], [address] [place], [place] [address]   Ferritin     Status: None   Collection Time: 10/29/23 10:00 AM  Result Value Ref Range   Ferritin 303 11 - 307 ng/mL    Comment: Performed at [facility], [address], [place], [place] [address]  Iron and TIBC     Status: Abnormal   Collection Time: 10/29/23 10:00 AM  Result Value Ref Range   Iron 134 28 - 170 ug/dL   TIBC 841 (L) 324 - 401 ug/dL   Saturation Ratios 92 (H) 10.4 - 31.8 %   UIBC 12 ug/dL    Comment: Performed at [facility], [address], [place], [place] [address]  AFP tumor marker     Status: Abnormal   Collection Time: 10/29/23 10:00 AM  Result Value Ref Range   AFP, Serum, Tumor Marker 629.0 (H) 0.0 - 9.2 ng/mL    Comment: (NOTE) Roche [employer]ostics Electrochemiluminescence Immunoassay (ECLIA) Values obtained with different assay methods or kits cannot be used interchangeably.  Results cannot be interpreted as absolute evidence of the presence or absence of malignant disease. This test is not interpretable in pregnant females. Performed At: [facility] [address] [place], [place] [id number] Jolene Schimke MD [id number]   APTT     Status: Abnormal   Collection Time: 10/29/23 10:00 AM  Result Value Ref Range   aPTT 48 (H) 24 - 36 seconds    Comment:  IF BASELINE aPTT IS ELEVATED, SUGGEST PATIENT RISK ASSESSMENT BE USED TO DETERMINE APPROPRIATE ANTICOAGULANT THERAPY. Performed at [facility], [address] [place], [place] [address]   Protime-INR     Status: Abnormal   Collection  Time: 10/29/23 10:00 AM  Result Value Ref Range   Prothrombin Time 24.9 (H) 11.4 - 15.2 seconds   INR 2.2 (H) 0.8 - 1.2    Comment: (NOTE) INR goal varies based on device and disease states. Performed at [facility], [address] [place], [place] [address]   Lactate dehydrogenase     Status: Abnormal   Collection Time: 10/29/23 10:00 AM  Result Value Ref Range   LDH 193 (H) 98 - 192 U/L    Comment: Performed at [facility], [address], [place], [place] [address]  CMP (Cancer Center only)     Status: Abnormal   Collection Time: 10/29/23 10:00 AM  Result Value Ref Range   Sodium 132 (L) 135 - 145 mmol/L   Potassium 4.4 3.5 - 5.1 mmol/L   Chloride 100 98 - 111 mmol/L   CO2 28 22 - 32 mmol/L   Glucose, Bld 85 70 - 99 mg/dL    Comment: Glucose reference range applies only to samples taken after fasting for at least 8 hours.   BUN 13 8 - 23 mg/dL   Creatinine 2.13 (H) 0.86 - 1.00 mg/dL   Calcium 9.3 8.9 - 57.8 mg/dL   Total Protein 46.9 (H) 6.5 - 8.1 g/dL   Albumin 2.2 (L) 3.5 - 5.0 g/dL   AST 629 (HH) 15 - 41 U/L    Comment: CRITICAL RESULT CALLED TO, READ BACK BY AND VERIFIED WITH: I WOOD, RN    ALT 93 (H) 0 - 44 U/L   Alkaline Phosphatase 82 38 - 126 U/L   Total Bilirubin 4.5 (HH) 0.0 - 1.2 mg/dL    Comment: CRITICAL RESULT CALLED TO, READ BACK BY AND VERIFIED WITH: I WOOD, RN    GFR, Estimated 27 (L) >60 mL/min    Comment: (NOTE) Calculated using the CKD-EPI Creatinine Equation (2021)    Anion gap 4 (L) 5 - 15    Comment: Performed at [facility], [address], [place], [place] [address]  CBC with Differential (Cancer Center Only)     Status: Abnormal   Collection Time: 10/29/23 10:00 AM  Result Value Ref Range   WBC Count 6.5 4.0 - 10.5 K/uL   RBC 3.31 (L) 3.87 - 5.11 MIL/uL   Hemoglobin 10.4 (L) 12.0 - 15.0 g/dL   HCT 32.4 (L) 40.1 - 02.7 %   MCV 89.7 80.0 - 100.0 fL   MCH 31.4 26.0 - 34.0 pg    MCHC 35.0 30.0 - 36.0 g/dL   RDW 25.3 (H) 66.4 - 40.3 %   Platelet Count 132 (L) 150 - 400 K/uL   nRBC 0.0 0.0 - 0.2 %   Neutrophils Relative % 55 %   Neutro Abs 3.6 1.7 - 7.7 K/uL   Lymphocytes Relative 35 %   Lymphs Abs 2.3 0.7 - 4.0 K/uL   Monocytes Relative 6 %   Monocytes Absolute 0.4 0.1 - 1.0 K/uL   Eosinophils Relative 3 %   Eosinophils Absolute 0.2 0.0 - 0.5 K/uL   Basophils Relative 1 %   Basophils Absolute 0.1 0.0 - 0.1 K/uL   Immature Granulocytes 0 %   Abs Immature Granulocytes 0.02 0.00 - 0.07 K/uL    Comment: Performed at [facility], [address], [place], [place] [address]  Glucose, capillary     Status: Abnormal   Collection Time: 11/04/23  9:16 AM  Result Value Ref Range   Glucose-Capillary 124 (H) 70 - 99 mg/dL    Comment: Glucose reference range applies only to samples taken after fasting for at least 8 hours.  CBC with Differential/Platelet     Status: Abnormal   Collection Time: 11/04/23 10:24 AM  Result Value Ref Range   WBC 5.2 4.0 - 10.5 K/uL   RBC 2.63 (L) 3.87 - 5.11 MIL/uL   Hemoglobin 8.7 (L) 12.0 - 15.0 g/dL   HCT 62.1 (L) 30.8 - 65.7 %   MCV 92.8 80.0 - 100.0 fL   MCH 33.1 26.0 - 34.0 pg   MCHC 35.7 30.0 - 36.0 g/dL   RDW 84.6 (H) 96.2 - 95.2 %   Platelets 86 (L) 150 - 400 K/uL    Comment: Immature Platelet Fraction may be clinically indicated, consider ordering this additional test [id number] REPEATED TO VERIFY    nRBC 0.4 (H) 0.0 - 0.2 %   Neutrophils Relative % 43 %   Neutro Abs 2.3 1.7 - 7.7 K/uL   Lymphocytes Relative 37 %   Lymphs Abs 1.9 0.7 - 4.0 K/uL   Monocytes Relative 15 %   Monocytes Absolute 0.8 0.1 - 1.0 K/uL   Eosinophils Relative 4 %   Eosinophils Absolute 0.2 0.0 - 0.5 K/uL   Basophils Relative 1 %   Basophils Absolute 0.0 0.0 - 0.1 K/uL   Immature Granulocytes 0 %   Abs Immature Granulocytes 0.02 0.00 - 0.07 K/uL    Comment: Performed at [facility], [address], [place],  [place] [address]  Comprehensive metabolic panel     Status: Abnormal   Collection Time: 11/04/23 10:24 AM  Result Value Ref Range   Sodium 132 (L) 135 - 145 mmol/L   Potassium 4.8 3.5 - 5.1 mmol/L   Chloride 105 98 - 111 mmol/L   CO2 21 (L) 22 - 32 mmol/L   Glucose, Bld 125 (H) 70 - 99 mg/dL    Comment: Glucose reference range applies only to samples taken after fasting for at least 8 hours.   BUN 17 8 - 23 mg/dL   Creatinine, Ser 5.36 (H) 0.44 - 1.00 mg/dL   Calcium 8.0 (L) 8.9 - 10.3 mg/dL   Total Protein 9.3 (H) 6.5 - 8.1 g/dL   Albumin 1.8 (L) 3.5 - 5.0 g/dL   AST 644 (H) 15 - 41 U/L   ALT 93 (H) 0 - 44 U/L   Alkaline Phosphatase 62 38 - 126 U/L   Total Bilirubin 3.2 (H) 0.0 - 1.2 mg/dL   GFR, Estimated 24 (L) >60 mL/min    Comment: (NOTE) Calculated using the CKD-EPI Creatinine Equation (2021)    Anion gap 6 5 - 15    Comment: Performed at [facility], [address] [place], [place] [address]  Cytology - Non PAP;     Status: None   Collection Time: 11/08/23  2:00 PM  Result Value Ref Range   CYTOLOGY - NON GYN      CYTOLOGY - NON PAP CASE: [id number] PATIENT: Rockey Situ Non-Gynecological Cytology Report     Clinical History: Hepatocellular carcinoma. Specimen Submitted:  A. ASCITES, PARACENTESIS:   FINAL MICROSCOPIC DIAGNOSIS: - No malignant cells identified  SPECIMEN ADEQUACY: Satisfactory for evaluation  GROSS: Received is/are 1200cc's of hazy, golden fluid. (EMH:emh) Prepared:  Smears:  0 Concentration Method (Thin Prep):  1 Cell Block:  1 conventional. Additional Studies:  N/A.     Final Diagnosis performed by Dillard Frame, MD.   Electronically signed 11/11/2023 Technical and / or Professional components performed at [facility]. [facility], [address], [place], [place] [address].  Immunohistochemistry Technical component (if applicable) was performed at [facility] [address], [place], [place]  [address].   IMMUNOHISTOCHEMISTRY DISCLAIMER (if applicable): Some of these immunohistochemical stains may have been deve loped and the performance characteristics determine by [facility]. Some may not have been cleared or approved by the U.S. Food and Drug Administration. The FDA has determined that such clearance or approval is not necessary. This test is used for clinical purposes. It should not be regarded as investigational or for research. This laboratory is certified under the Clinical Laboratory Improvement Amendments of 1988 (CLIA-88) as qualified to perform high complexity clinical laboratory testing.  The controls stained appropriately.   IHC stains are performed on formalin fixed, paraffin embedded tissue using a 3,3"diaminobenzidine (DAB) chromogen and Leica Bond Autostainer System. The staining intensity of the nucleus is score manually and is reported as the percentage of tumor cell nuclei demonstrating specific nuclear staining. The specimens are fixed in 10% Neutral Formalin for at least 6 hours and up to 72hrs. These tests are validated on decalcified tissue.  Results should be interpreted with caution given the possibility of false negative results on decalcified specimens. Antibody Clones are as follows ER-clone 67F, PR-clone 16, Ki67- clone MM1. Some of these immunohistochemical stains may have been developed and the performance characteristics determined by [facility].   AFP tumor marker     Status: Abnormal   Collection Time: 11/12/23  8:08 AM  Result Value Ref Range   AFP, Serum, Tumor Marker 937.0 (H) 0.0 - 9.2 ng/mL    Comment: (NOTE) Roche [employer]ostics Electrochemiluminescence Immunoassay (ECLIA) Values obtained with different assay methods or kits cannot be used interchangeably.  Results cannot be interpreted as absolute evidence of the presence or absence of malignant disease. This test is not interpretable in pregnant females. Performed  At: [facility] [address] [place], [place] [id number] Pearlean Botts MD [id number]   CBC with Differential (Cancer Center Only)     Status: Abnormal   Collection Time: 11/12/23  8:08 AM  Result Value Ref Range   WBC Count 7.4 4.0 - 10.5 K/uL   RBC 3.01 (L) 3.87 - 5.11 MIL/uL   Hemoglobin 9.9 (L) 12.0 - 15.0 g/dL   HCT 78.4 (L) 69.6 - 29.5 %   MCV 90.0 80.0 - 100.0 fL   MCH 32.9 26.0 - 34.0 pg   MCHC 36.5 (H) 30.0 - 36.0 g/dL   RDW 28.4 (H) 13.2 - 44.0 %   Platelet Count 114 (L) 150 - 400 K/uL   nRBC 0.0 0.0 - 0.2 %   Neutrophils Relative % 54 %   Neutro Abs 4.1 1.7 - 7.7 K/uL   Lymphocytes Relative 32 %   Lymphs Abs 2.3 0.7 - 4.0 K/uL   Monocytes Relative 10 %   Monocytes Absolute 0.8 0.1 - 1.0 K/uL   Eosinophils Relative 2 %   Eosinophils Absolute 0.2 0.0 - 0.5 K/uL   Basophils Relative 1 %   Basophils Absolute 0.1 0.0 - 0.1 K/uL   Immature Granulocytes 1 %   Abs Immature Granulocytes 0.04 0.00 - 0.07 K/uL    Comment: Performed at [facility], [address], [place], [place] [address]  Basic Metabolic Panel - Cancer Center Only     Status: Abnormal   Collection Time: 11/12/23  8:08 AM  Result Value Ref Range   Sodium 131 (L) 135 - 145 mmol/L   Potassium 4.3 3.5 - 5.1 mmol/L   Chloride 104 98 - 111 mmol/L   CO2 25 22 - 32 mmol/L   Glucose, Bld 88 70 - 99 mg/dL    Comment: Glucose reference range applies only to samples taken after fasting for at least 8 hours.   BUN 13 8 - 23 mg/dL   Creatinine 9.81 (H) 1.91 - 1.00 mg/dL   Calcium 8.3 (L) 8.9 - 10.3 mg/dL   GFR, Estimated 38 (L) >60 mL/min    Comment: (NOTE) Calculated using the CKD-EPI Creatinine Equation (2021)    Anion gap 2 (L) 5 - 15    Comment: REPEATED TO VERIFY Performed at [facility], [address], [place], [place] [address]   24 Hr Urine, Metanephrine     Status: None   Collection Time: 11/19/23  8:59 AM  Result Value Ref Range    Normetanephrine, Ur 329 Undefined ug/L    Comment: (NOTE) This test was developed and its performance characteristics determined by [facility]. It has not been cleared or approved by the Food and Drug Administration.    Normetanephrine, 24H Ur 592 131 - 612 ug/24 hr   Metaneph Total, Ur 98 Undefined ug/L    Comment: (NOTE) This test was developed and its performance characteristics determined by [facility]. It has not been cleared or approved by the Food and Drug Administration.    Metanephrines, 24H Ur 176 36 - 209 ug/24 hr    Comment: (NOTE) Performed At: [facility] [address] [place], [place] [id number] Jolene Schimke MD [id number]    Total Volume 1,800     Comment: Performed at [facility], [address] [place], [place] [address]  CMP (Cancer Center only)     Status: Abnormal   Collection Time: 12/17/23  8:10 AM  Result Value Ref Range   Sodium 128 (L) 135 - 145 mmol/L   Potassium 5.6 (H) 3.5 - 5.1 mmol/L   Chloride 107 98 - 111 mmol/L   CO2 18 (L) 22 - 32 mmol/L   Glucose, Bld 120 (H) 70 - 99 mg/dL    Comment: Glucose reference range applies only to samples taken after fasting for at least 8 hours.   BUN 23 8 - 23 mg/dL   Creatinine 0.10 (H) 2.72 - 1.00 mg/dL   Calcium 8.0 (L) 8.9 - 10.3 mg/dL   Total Protein 9.3 (H) 6.5 - 8.1 g/dL   Albumin 1.5 (L) 3.5 - 5.0 g/dL   AST 536 (HH) 15 - 41 U/L    Comment: CRITICAL RESULT CALLED TO, READ BACK BY AND VERIFIED WITH: SUSAN COWARD AT 6440 ON 12/17/2023 BY ALA    ALT 75 (H) 0 - 44 U/L   Alkaline Phosphatase 112 38 - 126 U/L   Total Bilirubin 2.9 (H) 0.0 - 1.2 mg/dL   GFR, Estimated 20 (L) >60 mL/min    Comment: (NOTE) Calculated using the CKD-EPI Creatinine Equation (2021)    Anion gap 3 (L) 5 - 15    Comment: Performed at [facility], [address], [place], [place] [address]  CBC with Differential (Cancer Center Only)     Status: Abnormal   Collection Time:  12/17/23  8:10 AM  Result  Value Ref Range   WBC Count 7.6 4.0 - 10.5 K/uL   RBC 2.66 (L) 3.87 - 5.11 MIL/uL   Hemoglobin 8.7 (L) 12.0 - 15.0 g/dL   HCT 46.9 (L) 62.9 - 52.8 %   MCV 92.9 80.0 - 100.0 fL   MCH 32.7 26.0 - 34.0 pg   MCHC 35.2 30.0 - 36.0 g/dL   RDW 41.3 (H) 24.4 - 01.0 %   Platelet Count 102 (L) 150 - 400 K/uL   nRBC 0.0 0.0 - 0.2 %   Neutrophils Relative % 65 %   Neutro Abs 4.9 1.7 - 7.7 K/uL   Lymphocytes Relative 24 %   Lymphs Abs 1.8 0.7 - 4.0 K/uL   Monocytes Relative 8 %   Monocytes Absolute 0.6 0.1 - 1.0 K/uL   Eosinophils Relative 2 %   Eosinophils Absolute 0.1 0.0 - 0.5 K/uL   Basophils Relative 1 %   Basophils Absolute 0.1 0.0 - 0.1 K/uL   Immature Granulocytes 0 %   Abs Immature Granulocytes 0.03 0.00 - 0.07 K/uL    Comment: Performed at [facility], [address] [place], [place] [address]  Beta 2 microglobulin, serum     Status: Abnormal   Collection Time: 12/17/23 10:20 AM  Result Value Ref Range   Beta-2 Microglobulin 7.8 (H) 0.6 - 2.4 mg/L    Comment: (NOTE) Siemens Immulite 2000 Immunochemiluminometric assay (ICMA) Values obtained with different assay methods or kits cannot be used interchangeably. Results cannot be interpreted as absolute evidence of the presence or absence of malignant disease. Performed At: [facility] [address] [place], [place] [id number] Pearlean Botts MD QV:[id number]   Uric acid     Status: Abnormal   Collection Time: 12/17/23 10:20 AM  Result Value Ref Range   Uric Acid, Serum 11.0 (H) 2.5 - 7.1 mg/dL    Comment: Performed at [facility], [address] [place], [place] [address]  Lactate dehydrogenase     Status: Abnormal   Collection Time: 12/17/23 10:20 AM  Result Value Ref Range   LDH 224 (H) 98 - 192 U/L    Comment: Performed at [facility], [address] [place], [place] [address]  Kappa/lambda light chains     Status: Abnormal    Collection Time: 12/17/23 10:20 AM  Result Value Ref Range   Kappa free light chain 459.5 (H) 3.3 - 19.4 mg/L   Lambda free light chains 204.0 (H) 5.7 - 26.3 mg/L   Kappa, lambda light chain ratio 2.25 (H) 0.26 - 1.65    Comment: (NOTE) Performed At: [facility]  [address] [place], [place] [id number] Pearlean Botts MD UX:[id number]   Multiple Myeloma Panel (SPEP&IFE w/QIG)     Status: Abnormal   Collection Time: 12/17/23 10:20 AM  Result Value Ref Range   IgG (Immunoglobin G), Serum 6,559 (H) 586 - 1,602 mg/dL    Comment: (NOTE) Results confirmed on dilution.    IgA 1,480 (H) 87 - 352 mg/dL    Comment: (NOTE) Results confirmed on dilution.    IgM (Immunoglobulin M), Srm 123 26 - 217 mg/dL   Total Protein ELP 9.5 (H) 6.0 - 8.5 g/dL   Albumin SerPl Elph-Mcnc 1.8 (L) 2.9 - 4.4 g/dL   Alpha 1 0.1 0.0 - 0.4 g/dL   Alpha2 Glob SerPl Elph-Mcnc 0.2 (L) 0.4 - 1.0 g/dL   B-Globulin SerPl Elph-Mcnc 1.3 0.7 - 1.3 g/dL   Gamma Glob SerPl Elph-Mcnc 6.1 (H) 0.4 - 1.8 g/dL  M Protein SerPl Elph-Mcnc Not Observed Not Observed g/dL   Globulin, Total 7.7 (H) 2.2 - 3.9 g/dL   Albumin/Glob SerPl 0.3 (L) 0.7 - 1.7   IFE 1 Comment (A)     Comment: Polyclonal increase detected in one or more immunoglobulins.   Please Note Comment     Comment: (NOTE) Protein electrophoresis scan will follow via computer, mail, or courier delivery. Performed At: [facility] [address] [place], [place] [id number] Jolene Schimke MD HY:[id number]   Fibrinogen     Status: Abnormal   Collection Time: 12/17/23 10:20 AM  Result Value Ref Range   Fibrinogen 163 (L) 210 - 475 mg/dL    Comment: (NOTE) Fibrinogen results may be underestimated in patients receiving thrombolytic therapy. Performed at [facility], [address], [place], [place] [address]   Protime-INR     Status: Abnormal   Collection Time: 12/17/23 10:20 AM  Result Value Ref Range   Prothrombin Time 30.2 (H)  11.4 - 15.2 seconds   INR 2.9 (H) 0.8 - 1.2    Comment: (NOTE) INR goal varies based on device and disease states. Performed at [facility], [address] [place], [place] [address]   APTT     Status: Abnormal   Collection Time: 12/17/23 10:20 AM  Result Value Ref Range   aPTT 50 (H) 24 - 36 seconds    Comment:        IF BASELINE aPTT IS ELEVATED, SUGGEST PATIENT RISK ASSESSMENT BE USED TO DETERMINE APPROPRIATE ANTICOAGULANT THERAPY. Performed at [facility], [address], [place], [place] [address]      RADIOGRAPHIC STUDIES:  I have personally reviewed the radiological images as listed and agree with the findings in the report.  MM 3D DIAGNOSTIC MAMMOGRAM BILATERAL BREAST Result Date: 11/26/2023 CLINICAL DATA:  Short-term follow-up for probably benign findings in the left breast, initially assessed on 12/17/2022. EXAM: DIGITAL DIAGNOSTIC BILATERAL MAMMOGRAM WITH TOMOSYNTHESIS AND CAD; ULTRASOUND LEFT BREAST LIMITED TECHNIQUE: Bilateral digital diagnostic mammography and breast tomosynthesis was performed. The images were evaluated with computer-aided detection. ; Targeted ultrasound examination of the left breast was performed. COMPARISON:  Previous exam(s). ACR Breast Density Category b: There are scattered areas of fibroglandular density. FINDINGS: Mildly dilated ducts in the retroareolar left breast are without change. There are no defined masses, areas of significant asymmetry, areas of architectural distortion or suspicious calcifications. Targeted left breast ultrasound is performed, showing mildly ectatic retroareolar ducts, with no intraductal debris or tissue. No mass. Ducts are stable to less prominent than on the prior studies. IMPRESSION: 1. No evidence of breast malignancy. 2. Benign retroareolar left breast duct ectasia. RECOMMENDATION: Screening mammogram in one year.(Code:SM-B-01Y) I have discussed the findings and  recommendations with the patient. If applicable, a reminder letter will be sent to the patient regarding the next appointment. BI-RADS CATEGORY  2: Benign. Electronically Signed   By: Amie Portland M.D.   On: 11/26/2023 08:26   [place] LIMITED ULTRASOUND INCLUDING AXILLA LEFT BREAST  Result Date: 11/26/2023 CLINICAL DATA:  Short-term follow-up for probably benign findings in the left breast, initially assessed on 12/17/2022. EXAM: DIGITAL DIAGNOSTIC BILATERAL MAMMOGRAM WITH TOMOSYNTHESIS AND CAD; ULTRASOUND LEFT BREAST LIMITED TECHNIQUE: Bilateral digital diagnostic mammography and breast tomosynthesis was performed. The images were evaluated with computer-aided detection. ; Targeted ultrasound examination of the left breast was performed. COMPARISON:  Previous exam(s). ACR Breast Density Category b: There are scattered areas of fibroglandular density. FINDINGS: Mildly dilated ducts in the  retroareolar left breast are without change. There are no defined masses, areas of significant asymmetry, areas of architectural distortion or suspicious calcifications. Targeted left breast ultrasound is performed, showing mildly ectatic retroareolar ducts, with no intraductal debris or tissue. No mass. Ducts are stable to less prominent than on the prior studies. IMPRESSION: 1. No evidence of breast malignancy. 2. Benign retroareolar left breast duct ectasia. RECOMMENDATION: Screening mammogram in one year.(Code:SM-B-01Y) I have discussed the findings and recommendations with the patient. If applicable, a reminder letter will be sent to the patient regarding the next appointment. BI-RADS CATEGORY  2: Benign. Electronically Signed   By: Amanda Jungling M.D.   On: 11/26/2023 08:26    Orders Placed This Encounter  Procedures   CBC with Differential (Cancer Center Only)    Standing Status:   Future    Expiration Date:   12/23/2024   CMP (Cancer Center only)    Standing Status:   Future    Expiration Date:   12/23/2024    Protime-INR    Standing Status:   Future    Expiration Date:   12/23/2024   APTT    Standing Status:   Future    Expiration Date:   12/23/2024   Fibrinogen    Standing Status:   Future    Expiration Date:   12/23/2024   Iron and TIBC    Standing Status:   Future    Expiration Date:   12/23/2024   Ferritin    Standing Status:   Future    Expiration Date:   12/23/2024     Future Appointments  Date Time Provider Department Center  12/25/2023 10:00 AM [facility] 4 [facility]  12/31/2023  8:30 AM [facility] NURSE [facility] None  12/31/2023  9:00 AM Johna Myers, MD [facility] None  01/14/2024  8:15 AM [facility] PHLEBOTOMIST [facility] None  01/14/2024  8:45 AM Delrose Rohwer, Gale Jude, MD [facility] None  01/14/2024  9:30 AM [facility] INFUSION [facility] None  02/11/2024  8:15 AM [facility] PHLEBOTOMIST [facility] None  02/11/2024  8:45 AM Alona Danford, Gale Jude, MD [facility] None  02/11/2024  9:30 AM [facility] INFUSION [facility] None    This document was completed utilizing speech recognition software. Grammatical errors, random word insertions, pronoun errors, and incomplete sentences are an occasional consequence of this system due to software limitations, ambient noise, and hardware issues. Any formal questions or concerns about the content, text or information contained within the body of this dictation should be directly addressed to the provider for clarification.

## 2023-12-24 NOTE — Assessment & Plan Note (Addendum)
 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 Confirmed via MRI. Complicated by autoimmune hepatitis.  Her case was presented at GI tumor conference by another provider on 10/16/2023.  It was determined that she is not a candidate for resection.  Plan was to obtain PET/CT following which a referral for liver directed therapy including Y90.  Also radiation oncology referral was to be considered for possible SBRT to adrenal lesion.  PET scan performed on 10/25/2023.  It did not show increased FDG activity in the liver lesion or in the adrenal lesion.  Her case was discussed again in tumor conference on 11/06/2023.  Recommendation made to refer her for possible liver transplant evaluation.  Liver transplantation team at [facility] reviewed her imaging studies and are concerned about clinically picture indicating metastatic disease to adrenal gland.  She was determined to be not a candidate for liver transplant.  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   24-hour urine metanephrine analysis on 11/19/2023 showed 176 mcg of metanephrines in 24-hour urine, which is within normal range, not indicative of pheochromocytoma.   Plan made for systemic treatment with tremelimumab plus durvalumab.  Started from 11/19/2023.  Her history of autoimmune hepatitis could complicate immunotherapy and we will closely monitor her for this.  She was referred to interventional radiology department regarding possibility of Y90 or other liver directed treatments.  Appointment pending.  Proceeded with cycle 2 of immunotherapy with durvalumab on 12/17/2023 and this will be continued every 4 weeks.  We will discuss with radiation oncology department for possible SBRT to the adrenal lesion, now that it looks like a metastatic lesion and not pheochromocytoma.  Labs did show evidence of coagulopathy on 12/17/2023, likely from hepatocellular carcinoma and her history of autoimmune  hepatitis.  However she is not showing any symptoms suggestive of DIC or acute coagulopathy.   Started on vitamin K as mentioned above  Next treatment will be due in 2 weeks from now.

## 2023-12-25 ENCOUNTER — Ambulatory Visit (HOSPITAL_COMMUNITY)
Admission: RE | Admit: 2023-12-25 | Discharge: 2023-12-25 | Disposition: A | Discharge status: Home / Self Care | Source: Ambulatory Visit | Attending: Oncology | Admitting: Oncology

## 2023-12-25 ENCOUNTER — Other Ambulatory Visit: Payer: Self-pay | Admitting: *Deleted

## 2023-12-25 DIAGNOSIS — K7460 Unspecified cirrhosis of liver: Secondary | ICD-10-CM | POA: Diagnosis present

## 2023-12-25 DIAGNOSIS — C220 Liver cell carcinoma: Secondary | ICD-10-CM

## 2023-12-25 DIAGNOSIS — R188 Other ascites: Secondary | ICD-10-CM | POA: Insufficient documentation

## 2023-12-25 DIAGNOSIS — N939 Abnormal uterine and vaginal bleeding, unspecified: Secondary | ICD-10-CM

## 2023-12-25 HISTORY — PX: IR PARACENTESIS: IMG2679

## 2023-12-25 MED ORDER — LIDOCAINE HCL 1 % IJ SOLN
INTRAMUSCULAR | Status: AC
  Filled 2023-12-25: qty 20

## 2023-12-25 MED ORDER — LIDOCAINE HCL 1 % IJ SOLN
20.0000 mL | Freq: Once | INTRAMUSCULAR | Status: AC
  Administered 2023-12-25: 10 mL

## 2023-12-25 NOTE — Procedures (Signed)
 PROCEDURE SUMMARY:  Successful image-guided paracentesis from the lrft abdomen.  Yielded 3.0 liters of clear, straw-colored peritoneal fluid.  No immediate complications.  EBL: zero Patient tolerated well.    Please see imaging section of Epic for full dictation.  Gordy Lauber Domenick Quebedeaux PA-C 12/25/2023 11:31 AM

## 2023-12-27 ENCOUNTER — Other Ambulatory Visit: Payer: Self-pay | Admitting: *Deleted

## 2023-12-27 DIAGNOSIS — N939 Abnormal uterine and vaginal bleeding, unspecified: Secondary | ICD-10-CM

## 2023-12-27 DIAGNOSIS — C220 Liver cell carcinoma: Secondary | ICD-10-CM

## 2023-12-30 ENCOUNTER — Inpatient Hospital Stay (HOSPITAL_BASED_OUTPATIENT_CLINIC_OR_DEPARTMENT_OTHER): Admitting: Oncology

## 2023-12-30 ENCOUNTER — Emergency Department (HOSPITAL_COMMUNITY)

## 2023-12-30 ENCOUNTER — Other Ambulatory Visit: Payer: Self-pay

## 2023-12-30 ENCOUNTER — Encounter: Payer: Self-pay | Admitting: Oncology

## 2023-12-30 ENCOUNTER — Inpatient Hospital Stay

## 2023-12-30 ENCOUNTER — Encounter (HOSPITAL_COMMUNITY): Payer: Self-pay | Admitting: Pulmonary Disease

## 2023-12-30 ENCOUNTER — Inpatient Hospital Stay (HOSPITAL_COMMUNITY)
Admission: EM | Admit: 2023-12-30 | Discharge: 2024-02-09 | DRG: 871 | Disposition: E | Attending: Internal Medicine | Admitting: Internal Medicine

## 2023-12-30 VITALS — BP 94/65 | HR 103 | Temp 98.0°F | Resp 20 | Ht 62.0 in | Wt 188.7 lb

## 2023-12-30 DIAGNOSIS — Z87442 Personal history of urinary calculi: Secondary | ICD-10-CM

## 2023-12-30 DIAGNOSIS — Z794 Long term (current) use of insulin: Secondary | ICD-10-CM

## 2023-12-30 DIAGNOSIS — N939 Abnormal uterine and vaginal bleeding, unspecified: Secondary | ICD-10-CM

## 2023-12-30 DIAGNOSIS — A419 Sepsis, unspecified organism: Secondary | ICD-10-CM | POA: Diagnosis present

## 2023-12-30 DIAGNOSIS — D689 Coagulation defect, unspecified: Secondary | ICD-10-CM | POA: Diagnosis present

## 2023-12-30 DIAGNOSIS — E8721 Acute metabolic acidosis: Secondary | ICD-10-CM | POA: Diagnosis present

## 2023-12-30 DIAGNOSIS — Z515 Encounter for palliative care: Secondary | ICD-10-CM | POA: Diagnosis not present

## 2023-12-30 DIAGNOSIS — E871 Hypo-osmolality and hyponatremia: Secondary | ICD-10-CM | POA: Diagnosis present

## 2023-12-30 DIAGNOSIS — N170 Acute kidney failure with tubular necrosis: Secondary | ICD-10-CM | POA: Diagnosis present

## 2023-12-30 DIAGNOSIS — K5720 Diverticulitis of large intestine with perforation and abscess without bleeding: Secondary | ICD-10-CM | POA: Diagnosis present

## 2023-12-30 DIAGNOSIS — E1122 Type 2 diabetes mellitus with diabetic chronic kidney disease: Secondary | ICD-10-CM | POA: Diagnosis present

## 2023-12-30 DIAGNOSIS — G9340 Encephalopathy, unspecified: Secondary | ICD-10-CM

## 2023-12-30 DIAGNOSIS — E66811 Obesity, class 1: Secondary | ICD-10-CM | POA: Diagnosis present

## 2023-12-30 DIAGNOSIS — R579 Shock, unspecified: Secondary | ICD-10-CM | POA: Diagnosis not present

## 2023-12-30 DIAGNOSIS — R6521 Severe sepsis with septic shock: Secondary | ICD-10-CM | POA: Diagnosis present

## 2023-12-30 DIAGNOSIS — R188 Other ascites: Secondary | ICD-10-CM | POA: Diagnosis present

## 2023-12-30 DIAGNOSIS — Z841 Family history of disorders of kidney and ureter: Secondary | ICD-10-CM

## 2023-12-30 DIAGNOSIS — N1832 Chronic kidney disease, stage 3b: Secondary | ICD-10-CM | POA: Diagnosis present

## 2023-12-30 DIAGNOSIS — K7460 Unspecified cirrhosis of liver: Secondary | ICD-10-CM | POA: Diagnosis present

## 2023-12-30 DIAGNOSIS — K7290 Hepatic failure, unspecified without coma: Secondary | ICD-10-CM | POA: Diagnosis present

## 2023-12-30 DIAGNOSIS — E870 Hyperosmolality and hypernatremia: Secondary | ICD-10-CM | POA: Diagnosis not present

## 2023-12-30 DIAGNOSIS — C7971 Secondary malignant neoplasm of right adrenal gland: Secondary | ICD-10-CM | POA: Diagnosis present

## 2023-12-30 DIAGNOSIS — E8770 Fluid overload, unspecified: Secondary | ICD-10-CM | POA: Diagnosis present

## 2023-12-30 DIAGNOSIS — E43 Unspecified severe protein-calorie malnutrition: Secondary | ICD-10-CM | POA: Diagnosis present

## 2023-12-30 DIAGNOSIS — D630 Anemia in neoplastic disease: Secondary | ICD-10-CM | POA: Diagnosis present

## 2023-12-30 DIAGNOSIS — Z6839 Body mass index (BMI) 39.0-39.9, adult: Secondary | ICD-10-CM

## 2023-12-30 DIAGNOSIS — Z885 Allergy status to narcotic agent status: Secondary | ICD-10-CM

## 2023-12-30 DIAGNOSIS — N179 Acute kidney failure, unspecified: Secondary | ICD-10-CM

## 2023-12-30 DIAGNOSIS — Z8249 Family history of ischemic heart disease and other diseases of the circulatory system: Secondary | ICD-10-CM

## 2023-12-30 DIAGNOSIS — I129 Hypertensive chronic kidney disease with stage 1 through stage 4 chronic kidney disease, or unspecified chronic kidney disease: Secondary | ICD-10-CM | POA: Diagnosis present

## 2023-12-30 DIAGNOSIS — E7800 Pure hypercholesterolemia, unspecified: Secondary | ICD-10-CM | POA: Diagnosis present

## 2023-12-30 DIAGNOSIS — G9341 Metabolic encephalopathy: Secondary | ICD-10-CM | POA: Diagnosis present

## 2023-12-30 DIAGNOSIS — C220 Liver cell carcinoma: Secondary | ICD-10-CM | POA: Diagnosis present

## 2023-12-30 DIAGNOSIS — Z79890 Hormone replacement therapy (postmenopausal): Secondary | ICD-10-CM

## 2023-12-30 DIAGNOSIS — R180 Malignant ascites: Secondary | ICD-10-CM | POA: Diagnosis not present

## 2023-12-30 DIAGNOSIS — K7689 Other specified diseases of liver: Secondary | ICD-10-CM | POA: Diagnosis not present

## 2023-12-30 DIAGNOSIS — K5792 Diverticulitis of intestine, part unspecified, without perforation or abscess without bleeding: Secondary | ICD-10-CM | POA: Diagnosis not present

## 2023-12-30 DIAGNOSIS — E861 Hypovolemia: Secondary | ICD-10-CM | POA: Diagnosis present

## 2023-12-30 DIAGNOSIS — K766 Portal hypertension: Secondary | ICD-10-CM | POA: Diagnosis present

## 2023-12-30 DIAGNOSIS — R059 Cough, unspecified: Secondary | ICD-10-CM | POA: Diagnosis not present

## 2023-12-30 DIAGNOSIS — D649 Anemia, unspecified: Secondary | ICD-10-CM

## 2023-12-30 DIAGNOSIS — Z888 Allergy status to other drugs, medicaments and biological substances status: Secondary | ICD-10-CM

## 2023-12-30 DIAGNOSIS — E875 Hyperkalemia: Secondary | ICD-10-CM | POA: Diagnosis present

## 2023-12-30 DIAGNOSIS — D696 Thrombocytopenia, unspecified: Secondary | ICD-10-CM | POA: Diagnosis not present

## 2023-12-30 DIAGNOSIS — E039 Hypothyroidism, unspecified: Secondary | ICD-10-CM | POA: Diagnosis present

## 2023-12-30 DIAGNOSIS — K7682 Hepatic encephalopathy: Secondary | ICD-10-CM | POA: Diagnosis present

## 2023-12-30 DIAGNOSIS — Z66 Do not resuscitate: Secondary | ICD-10-CM | POA: Diagnosis not present

## 2023-12-30 DIAGNOSIS — E1165 Type 2 diabetes mellitus with hyperglycemia: Secondary | ICD-10-CM | POA: Diagnosis present

## 2023-12-30 DIAGNOSIS — Z7985 Long-term (current) use of injectable non-insulin antidiabetic drugs: Secondary | ICD-10-CM

## 2023-12-30 DIAGNOSIS — K754 Autoimmune hepatitis: Secondary | ICD-10-CM | POA: Diagnosis present

## 2023-12-30 DIAGNOSIS — N189 Chronic kidney disease, unspecified: Secondary | ICD-10-CM | POA: Diagnosis not present

## 2023-12-30 DIAGNOSIS — Z79899 Other long term (current) drug therapy: Secondary | ICD-10-CM

## 2023-12-30 DIAGNOSIS — Z7952 Long term (current) use of systemic steroids: Secondary | ICD-10-CM

## 2023-12-30 DIAGNOSIS — Z7960 Long term (current) use of unspecified immunomodulators and immunosuppressants: Secondary | ICD-10-CM

## 2023-12-30 DIAGNOSIS — E8809 Other disorders of plasma-protein metabolism, not elsewhere classified: Secondary | ICD-10-CM | POA: Diagnosis present

## 2023-12-30 LAB — CMP (CANCER CENTER ONLY)
ALT: 124 U/L — ABNORMAL HIGH (ref 0–44)
AST: 294 U/L (ref 15–41)
Albumin: <1.5 g/dL — ABNORMAL LOW (ref 3.5–5.0)
Alkaline Phosphatase: 101 U/L (ref 38–126)
Anion gap: 5 (ref 5–15)
BUN: 56 mg/dL — ABNORMAL HIGH (ref 8–23)
CO2: 16 mmol/L — ABNORMAL LOW (ref 22–32)
Calcium: 8.5 mg/dL — ABNORMAL LOW (ref 8.9–10.3)
Chloride: 106 mmol/L (ref 98–111)
Creatinine: 4.73 mg/dL — ABNORMAL HIGH (ref 0.44–1.00)
GFR, Estimated: 10 mL/min — ABNORMAL LOW (ref >60)
Glucose, Bld: 91 mg/dL (ref 70–99)
Potassium: 6.3 mmol/L (ref 3.5–5.1)
Sodium: 127 mmol/L — ABNORMAL LOW (ref 135–145)
Total Bilirubin: 3.6 mg/dL (ref 0.0–1.2)
Total Protein: 9.5 g/dL — ABNORMAL HIGH (ref 6.5–8.1)

## 2023-12-30 LAB — CBC WITH DIFFERENTIAL/PLATELET
Abs Immature Granulocytes: 0.26 10*3/uL — ABNORMAL HIGH (ref 0.00–0.07)
Basophils Absolute: 0 10*3/uL (ref 0.0–0.1)
Basophils Relative: 0 %
Eosinophils Absolute: 0.1 10*3/uL (ref 0.0–0.5)
Eosinophils Relative: 1 %
HCT: 20.8 % — ABNORMAL LOW (ref 36.0–46.0)
Hemoglobin: 7.3 g/dL — ABNORMAL LOW (ref 12.0–15.0)
Immature Granulocytes: 3 %
Lymphocytes Relative: 23 %
Lymphs Abs: 2.2 10*3/uL (ref 0.7–4.0)
MCH: 32.9 pg (ref 26.0–34.0)
MCHC: 35.1 g/dL (ref 30.0–36.0)
MCV: 93.7 fL (ref 80.0–100.0)
Monocytes Absolute: 1.1 10*3/uL — ABNORMAL HIGH (ref 0.1–1.0)
Monocytes Relative: 11 %
Neutro Abs: 6 10*3/uL (ref 1.7–7.7)
Neutrophils Relative %: 62 %
Platelets: 80 10*3/uL — ABNORMAL LOW (ref 150–400)
RBC: 2.22 MIL/uL — ABNORMAL LOW (ref 3.87–5.11)
RDW: 17.6 % — ABNORMAL HIGH (ref 11.5–15.5)
WBC: 9.7 10*3/uL (ref 4.0–10.5)
nRBC: 0.3 % — ABNORMAL HIGH (ref 0.0–0.2)

## 2023-12-30 LAB — MAGNESIUM: Magnesium: 2.1 mg/dL (ref 1.7–2.4)

## 2023-12-30 LAB — COMPREHENSIVE METABOLIC PANEL WITH GFR
ALT: 124 U/L — ABNORMAL HIGH (ref 0–44)
AST: 307 U/L — ABNORMAL HIGH (ref 15–41)
Albumin: <1.5 g/dL — ABNORMAL LOW (ref 3.5–5.0)
Alkaline Phosphatase: 89 U/L (ref 38–126)
Anion gap: 7 (ref 5–15)
BUN: 57 mg/dL — ABNORMAL HIGH (ref 8–23)
CO2: 14 mmol/L — ABNORMAL LOW (ref 22–32)
Calcium: 8 mg/dL — ABNORMAL LOW (ref 8.9–10.3)
Chloride: 107 mmol/L (ref 98–111)
Creatinine, Ser: 4.82 mg/dL — ABNORMAL HIGH (ref 0.44–1.00)
GFR, Estimated: 9 mL/min — ABNORMAL LOW (ref >60)
Glucose, Bld: 97 mg/dL (ref 70–99)
Potassium: 6.5 mmol/L (ref 3.5–5.1)
Sodium: 128 mmol/L — ABNORMAL LOW (ref 135–145)
Total Bilirubin: 3.3 mg/dL — ABNORMAL HIGH (ref 0.0–1.2)
Total Protein: 8.7 g/dL — ABNORMAL HIGH (ref 6.5–8.1)

## 2023-12-30 LAB — PROTIME-INR
INR: 2.8 — ABNORMAL HIGH (ref 0.8–1.2)
INR: 2.7 — ABNORMAL HIGH (ref 0.8–1.2)
Prothrombin Time: 30 s — ABNORMAL HIGH (ref 11.4–15.2)
Prothrombin Time: 29.3 s — ABNORMAL HIGH (ref 11.4–15.2)

## 2023-12-30 LAB — CBC
HCT: 23.6 % — ABNORMAL LOW (ref 36.0–46.0)
Hemoglobin: 7.7 g/dL — ABNORMAL LOW (ref 12.0–15.0)
MCH: 32.1 pg (ref 26.0–34.0)
MCHC: 32.6 g/dL (ref 30.0–36.0)
MCV: 98.3 fL (ref 80.0–100.0)
Platelets: 81 10*3/uL — ABNORMAL LOW (ref 150–400)
RBC: 2.4 MIL/uL — ABNORMAL LOW (ref 3.87–5.11)
RDW: 16.6 % — ABNORMAL HIGH (ref 11.5–15.5)
WBC: 10.7 10*3/uL — ABNORMAL HIGH (ref 4.0–10.5)
nRBC: 0 % (ref 0.0–0.2)

## 2023-12-30 LAB — MRSA NEXT GEN BY PCR, NASAL: MRSA by PCR Next Gen: NOT DETECTED

## 2023-12-30 LAB — CBC WITH DIFFERENTIAL (CANCER CENTER ONLY)
Abs Immature Granulocytes: 0.1 10*3/uL — ABNORMAL HIGH (ref 0.00–0.07)
Basophils Absolute: 0.1 10*3/uL (ref 0.0–0.1)
Basophils Relative: 1 %
Eosinophils Absolute: 0.2 10*3/uL (ref 0.0–0.5)
Eosinophils Relative: 2 %
HCT: 21.5 % — ABNORMAL LOW (ref 36.0–46.0)
Hemoglobin: 7.8 g/dL — ABNORMAL LOW (ref 12.0–15.0)
Immature Granulocytes: 1 %
Lymphocytes Relative: 30 %
Lymphs Abs: 2.7 10*3/uL (ref 0.7–4.0)
MCH: 32.8 pg (ref 26.0–34.0)
MCHC: 36.3 g/dL — ABNORMAL HIGH (ref 30.0–36.0)
MCV: 90.3 fL (ref 80.0–100.0)
Monocytes Absolute: 0.9 10*3/uL (ref 0.1–1.0)
Monocytes Relative: 10 %
Neutro Abs: 5 10*3/uL (ref 1.7–7.7)
Neutrophils Relative %: 56 %
Platelet Count: 104 10*3/uL — ABNORMAL LOW (ref 150–400)
RBC: 2.38 MIL/uL — ABNORMAL LOW (ref 3.87–5.11)
RDW: 17.4 % — ABNORMAL HIGH (ref 11.5–15.5)
WBC Count: 8.9 10*3/uL (ref 4.0–10.5)
nRBC: 0 % (ref 0.0–0.2)

## 2023-12-30 LAB — RESP PANEL BY RT-PCR (RSV, FLU A&B, COVID)  RVPGX2
Influenza A by PCR: NEGATIVE
Influenza B by PCR: NEGATIVE
Resp Syncytial Virus by PCR: NEGATIVE
SARS Coronavirus 2 by RT PCR: NEGATIVE

## 2023-12-30 LAB — HEMOGLOBIN A1C
Hgb A1c MFr Bld: 4.7 % — ABNORMAL LOW (ref 4.8–5.6)
Mean Plasma Glucose: 88.19 mg/dL

## 2023-12-30 LAB — LACTIC ACID, PLASMA
Lactic Acid, Venous: 6.3 mmol/L (ref 0.5–1.9)
Lactic Acid, Venous: 7.5 mmol/L (ref 0.5–1.9)

## 2023-12-30 LAB — FIBRINOGEN: Fibrinogen: 133 mg/dL — ABNORMAL LOW (ref 210–475)

## 2023-12-30 LAB — PREPARE RBC (CROSSMATCH)

## 2023-12-30 LAB — GLUCOSE, CAPILLARY
Glucose-Capillary: 114 mg/dL — ABNORMAL HIGH (ref 70–99)
Glucose-Capillary: 107 mg/dL — ABNORMAL HIGH (ref 70–99)

## 2023-12-30 LAB — IRON AND TIBC
Iron: 114 ug/dL (ref 28–170)
Saturation Ratios: 98 % — ABNORMAL HIGH (ref 10.4–31.8)
TIBC: 116 ug/dL — ABNORMAL LOW (ref 250–450)
UIBC: 2 ug/dL

## 2023-12-30 LAB — LACTATE DEHYDROGENASE: LDH: 218 U/L — ABNORMAL HIGH (ref 98–192)

## 2023-12-30 LAB — I-STAT CG4 LACTIC ACID, ED: Lactic Acid, Venous: 4.2 mmol/L (ref 0.5–1.9)

## 2023-12-30 LAB — POTASSIUM
Potassium: 4.5 mmol/L (ref 3.5–5.1)
Potassium: 5.6 mmol/L — ABNORMAL HIGH (ref 3.5–5.1)

## 2023-12-30 LAB — FERRITIN: Ferritin: 342 ng/mL — ABNORMAL HIGH (ref 11–307)

## 2023-12-30 LAB — AMMONIA: Ammonia: 83 umol/L — ABNORMAL HIGH (ref 9–35)

## 2023-12-30 LAB — CBG MONITORING, ED: Glucose-Capillary: 137 mg/dL — ABNORMAL HIGH (ref 70–99)

## 2023-12-30 LAB — SAMPLE TO BLOOD BANK

## 2023-12-30 LAB — LIPASE, BLOOD: Lipase: 49 U/L (ref 11–51)

## 2023-12-30 LAB — PHOSPHORUS: Phosphorus: 5 mg/dL — ABNORMAL HIGH (ref 2.5–4.6)

## 2023-12-30 LAB — URIC ACID: Uric Acid, Serum: 11.2 mg/dL — ABNORMAL HIGH (ref 2.5–7.1)

## 2023-12-30 LAB — ABO/RH: ABO/RH(D): A POS

## 2023-12-30 LAB — APTT: aPTT: 52 s — ABNORMAL HIGH (ref 24–36)

## 2023-12-30 MED ORDER — HYDROMORPHONE HCL 1 MG/ML IJ SOLN
0.5000 mg | INTRAMUSCULAR | Status: DC | PRN
  Administered 2024-01-02 – 2024-01-07 (×4): 0.5 mg via INTRAVENOUS
  Filled 2023-12-30 (×4): qty 1

## 2023-12-30 MED ORDER — PHYTONADIONE 5 MG PO TABS: 2.5000 mg | ORAL_TABLET | Freq: Every day | ORAL | Status: DC

## 2023-12-30 MED ORDER — PIPERACILLIN-TAZOBACTAM 3.375 G IVPB
3.3750 g | Freq: Two times a day (BID) | INTRAVENOUS | Status: DC
  Administered 2023-12-31 – 2024-01-09 (×19): 3.375 g via INTRAVENOUS
  Filled 2023-12-30 (×19): qty 50

## 2023-12-30 MED ORDER — SODIUM CHLORIDE 0.9% IV SOLUTION: Freq: Once | INTRAVENOUS | Status: AC

## 2023-12-30 MED ORDER — ALBUTEROL SULFATE (2.5 MG/3ML) 0.083% IN NEBU
INHALATION_SOLUTION | RESPIRATORY_TRACT | Status: AC
  Filled 2023-12-30: qty 12

## 2023-12-30 MED ORDER — SODIUM CHLORIDE 0.9 % IV SOLN
2.0000 g | Freq: Once | INTRAVENOUS | Status: AC
  Administered 2023-12-30: 2 g via INTRAVENOUS
  Filled 2023-12-30: qty 12.5

## 2023-12-30 MED ORDER — DOCUSATE SODIUM 100 MG PO CAPS: 100.0000 mg | ORAL_CAPSULE | Freq: Two times a day (BID) | ORAL | Status: DC | PRN

## 2023-12-30 MED ORDER — LACTATED RINGERS IV BOLUS
1000.0000 mL | Freq: Once | INTRAVENOUS | Status: AC
  Administered 2023-12-30: 1000 mL via INTRAVENOUS

## 2023-12-30 MED ORDER — SODIUM ZIRCONIUM CYCLOSILICATE 10 G PO PACK
10.0000 g | PACK | Freq: Once | ORAL | Status: AC
  Administered 2023-12-30: 10 g via ORAL
  Filled 2023-12-30: qty 1

## 2023-12-30 MED ORDER — LACTATED RINGERS IV SOLN: INTRAVENOUS | Status: AC

## 2023-12-30 MED ORDER — SODIUM CHLORIDE 0.9 % IV SOLN
6.0000 mg | Freq: Once | INTRAVENOUS | Status: AC
  Administered 2023-12-30: 6 mg via INTRAVENOUS
  Filled 2023-12-30: qty 4

## 2023-12-30 MED ORDER — INSULIN ASPART 100 UNIT/ML IV SOLN
5.0000 [IU] | Freq: Once | INTRAVENOUS | Status: AC
  Administered 2023-12-30: 5 [IU] via INTRAVENOUS
  Filled 2023-12-30: qty 0.05

## 2023-12-30 MED ORDER — SODIUM CHLORIDE 0.9 % IV BOLUS
1000.0000 mL | Freq: Once | INTRAVENOUS | Status: AC
  Administered 2023-12-30: 1000 mL via INTRAVENOUS

## 2023-12-30 MED ORDER — HYDROMORPHONE HCL 1 MG/ML IJ SOLN
0.5000 mg | Freq: Once | INTRAMUSCULAR | Status: AC
  Administered 2023-12-30: 0.5 mg via INTRAVENOUS
  Filled 2023-12-30: qty 1

## 2023-12-30 MED ORDER — ONDANSETRON HCL 4 MG/2ML IJ SOLN
4.0000 mg | Freq: Four times a day (QID) | INTRAMUSCULAR | Status: DC | PRN
  Administered 2023-12-30 – 2024-01-05 (×2): 4 mg via INTRAVENOUS
  Filled 2023-12-30 (×2): qty 2

## 2023-12-30 MED ORDER — LIDOCAINE HCL 1 % IJ SOLN
INTRAMUSCULAR | Status: AC
  Filled 2023-12-30: qty 20

## 2023-12-30 MED ORDER — NOREPINEPHRINE 4 MG/250ML-% IV SOLN
2.0000 ug/min | INTRAVENOUS | Status: DC
  Administered 2024-01-01: 2 ug/min via INTRAVENOUS
  Administered 2024-01-02: 3 ug/min via INTRAVENOUS
  Administered 2024-01-03: 4 ug/min via INTRAVENOUS
  Filled 2023-12-30 (×3): qty 250

## 2023-12-30 MED ORDER — CALCIUM GLUCONATE-NACL 1-0.675 GM/50ML-% IV SOLN
1.0000 g | Freq: Once | INTRAVENOUS | Status: AC
  Administered 2023-12-30: 1000 mg via INTRAVENOUS
  Filled 2023-12-30: qty 50

## 2023-12-30 MED ORDER — NOREPINEPHRINE 4 MG/250ML-% IV SOLN
0.0000 ug/min | INTRAVENOUS | Status: DC
  Administered 2023-12-30: 2 ug/min via INTRAVENOUS
  Filled 2023-12-30: qty 250

## 2023-12-30 MED ORDER — METRONIDAZOLE 500 MG/100ML IV SOLN
500.0000 mg | Freq: Once | INTRAVENOUS | Status: AC
  Administered 2023-12-30: 500 mg via INTRAVENOUS
  Filled 2023-12-30: qty 100

## 2023-12-30 MED ORDER — INSULIN ASPART 100 UNIT/ML IJ SOLN
0.0000 [IU] | INTRAMUSCULAR | Status: DC
  Administered 2023-12-30: 1 [IU] via SUBCUTANEOUS
  Administered 2024-01-02: 2 [IU] via SUBCUTANEOUS
  Administered 2024-01-02 (×3): 1 [IU] via SUBCUTANEOUS
  Administered 2024-01-02: 2 [IU] via SUBCUTANEOUS
  Administered 2024-01-02 (×2): 1 [IU] via SUBCUTANEOUS
  Administered 2024-01-03 (×3): 2 [IU] via SUBCUTANEOUS
  Administered 2024-01-03: 1 [IU] via SUBCUTANEOUS
  Administered 2024-01-04 (×5): 2 [IU] via SUBCUTANEOUS
  Administered 2024-01-05: 3 [IU] via SUBCUTANEOUS
  Administered 2024-01-05 (×2): 2 [IU] via SUBCUTANEOUS
  Administered 2024-01-05: 3 [IU] via SUBCUTANEOUS
  Administered 2024-01-05 (×2): 2 [IU] via SUBCUTANEOUS
  Administered 2024-01-06: 3 [IU] via SUBCUTANEOUS
  Administered 2024-01-06: 2 [IU] via SUBCUTANEOUS
  Administered 2024-01-06: 5 [IU] via SUBCUTANEOUS
  Administered 2024-01-06 (×2): 2 [IU] via SUBCUTANEOUS
  Administered 2024-01-06: 5 [IU] via SUBCUTANEOUS
  Administered 2024-01-07: 1 [IU] via SUBCUTANEOUS
  Administered 2024-01-07 (×2): 3 [IU] via SUBCUTANEOUS
  Administered 2024-01-07: 1 [IU] via SUBCUTANEOUS
  Administered 2024-01-07: 2 [IU] via SUBCUTANEOUS
  Administered 2024-01-07 – 2024-01-09 (×5): 1 [IU] via SUBCUTANEOUS
  Administered 2024-01-09: 2 [IU] via SUBCUTANEOUS
  Administered 2024-01-09: 1 [IU] via SUBCUTANEOUS
  Administered 2024-01-10: 3 [IU] via SUBCUTANEOUS
  Administered 2024-01-10 – 2024-01-11 (×9): 2 [IU] via SUBCUTANEOUS
  Filled 2023-12-30: qty 0.09

## 2023-12-30 MED ORDER — SODIUM CHLORIDE 0.9 % IV SOLN
250.0000 mL | INTRAVENOUS | Status: AC
  Administered 2023-12-30: 250 mL via INTRAVENOUS

## 2023-12-30 MED ORDER — HYDROMORPHONE HCL 1 MG/ML IJ SOLN
1.0000 mg | INTRAMUSCULAR | Status: DC | PRN
  Administered 2023-12-31 – 2024-01-08 (×3): 1 mg via INTRAVENOUS
  Filled 2023-12-30 (×4): qty 1

## 2023-12-30 MED ORDER — ALBUTEROL SULFATE (2.5 MG/3ML) 0.083% IN NEBU
10.0000 mg | INHALATION_SOLUTION | Freq: Once | RESPIRATORY_TRACT | Status: AC
  Administered 2023-12-30: 10 mg via RESPIRATORY_TRACT
  Filled 2023-12-30: qty 12

## 2023-12-30 MED ORDER — VANCOMYCIN HCL IN DEXTROSE 1-5 GM/200ML-% IV SOLN
1000.0000 mg | Freq: Once | INTRAVENOUS | Status: AC
  Administered 2023-12-30: 1000 mg via INTRAVENOUS
  Filled 2023-12-30: qty 200

## 2023-12-30 MED ORDER — DEXTROSE 50 % IV SOLN
1.0000 | Freq: Once | INTRAVENOUS | Status: AC
  Administered 2023-12-30: 50 mL via INTRAVENOUS
  Filled 2023-12-30: qty 50

## 2023-12-30 MED ORDER — ALBUMIN HUMAN 25 % IV SOLN
25.0000 g | Freq: Once | INTRAVENOUS | Status: AC
  Administered 2023-12-30: 25 g via INTRAVENOUS
  Filled 2023-12-30: qty 100

## 2023-12-30 MED ORDER — LINEZOLID 600 MG/300ML IV SOLN
600.0000 mg | Freq: Two times a day (BID) | INTRAVENOUS | Status: DC
Start: 2023-12-31 — End: 2023-12-31
  Filled 2023-12-30: qty 300

## 2023-12-30 MED ORDER — POLYETHYLENE GLYCOL 3350 17 G PO PACK: 17.0000 g | PACK | Freq: Every day | ORAL | Status: DC | PRN

## 2023-12-30 NOTE — ED Provider Notes (Signed)
 [place] EMERGENCY DEPARTMENT AT [facility] Provider Note   CSN: [id number] Arrival date & time: 12/30/23  0941     History  Chief Complaint  Patient presents with   abnormal labs    Dana Thornton is a 68 y.o. female.  HPI Patient presents for abnormal outpatient lab results.  Medical history includes DM, HTN, HLD, anemia, cirrhosis, autoimmune hepatitis, metastatic hepatocellular carcinoma.  She is followed by [facility] health heme-onc, Dr. Randye Buttner.  She is on tremelimumab  and Durvalumab  for her cancer therapy.  She underwent therapeutic paracentesis 5 days ago.  The following day, she had recurrence of ascites with associated generalized abdominal pain, greater on the right side.  She has known coagulopathy and is on daily vitamin K  supplement.  She previously had ongoing abnormal uterine bleeding, which she states has resolved.  She was seen by oncology today.  Patient was advised to come to the ED for drop in hemoglobin.  Per chart review, today's lab work also concerning for renal failure and hyperkalemia.     Home Medications Prior to Admission medications   Medication Sig Start Date End Date Taking? Authorizing Provider  ALEVE 220 MG tablet Take 220-440 mg by mouth 2 (two) times daily as needed (for pain or headaches).    [provider]  amLODipine  (NORVASC ) 10 MG tablet Take 1 tablet (10 mg total) by mouth daily. 11/17/17   Colin Dawley, MD  carvedilol  (COREG ) 25 MG tablet Take 25 mg by mouth 2 (two) times daily with a meal.    [provider]  ezetimibe  (ZETIA ) 10 MG tablet Take 10 mg by mouth daily.    [provider]  furosemide (LASIX) 40 MG tablet Take 40 mg by mouth daily as needed for fluid or edema.    [provider]  insulin  glargine (LANTUS ) 100 UNIT/ML injection 30  Units sq Qhs and 12 units q am Patient not taking: Reported on 11/12/2023 11/17/17   Colin Dawley, MD  insulin  lispro (HUMALOG KWIKPEN) 100 UNIT/ML  KwikPen Inject 10 Units into the skin 3 (three) times daily with meals as needed (AS DIRECTED FOR AN ELEVATED BGL).    [provider]  levothyroxine  (SYNTHROID ) 300 MCG tablet Take 150-300 mcg by mouth See admin instructions. Take 150 mcg by mouth 30 minutes before breakfast on Sunday and Saturday and 300 mcg on Mon/Tues/Wed/Thurs/Fri    [provider]  losartan (COZAAR) 100 MG tablet Take 100 mg by mouth daily.    [provider]  mercaptopurine  (PURINETHOL ) 50 MG tablet Take 50 mg by mouth daily. Give on an empty stomach 1 hour before or 2 hours after meals. Caution: Chemotherapy. Patient not taking: Reported on 12/30/2023    [provider]  ondansetron  (ZOFRAN ) 4 MG tablet Take 4 mg by mouth every 6 (six) hours as needed for nausea or vomiting. Patient not taking: Reported on 12/30/2023    [provider]  ondansetron  (ZOFRAN ) 8 MG tablet Take 1 tablet (8 mg total) by mouth every 8 (eight) hours as needed for nausea or vomiting. 11/12/23   Pasam, Gale Jude, MD  predniSONE  (DELTASONE ) 10 MG tablet Take 2 tablets (20 mg) daily with food for 4 days, then 1 tablet (10 mg) daily with food until seen by your GI doctor Patient taking differently: Take 10 mg by mouth daily with breakfast. 11/17/17   Colin Dawley, MD  prochlorperazine  (COMPAZINE ) 10 MG tablet Take 1 tablet (10 mg total) by mouth every 6 (six) hours  as needed for nausea or vomiting. 11/12/23   Pasam, Avinash, MD  triamcinolone cream (KENALOG) 0.1 % Apply 1 Application topically 2 (two) times daily as needed (for irritation, as directed- affected area).    [provider]  TRULICITY 0.75 MG/0.5ML SOAJ Inject 0.75 mg into the skin every Sunday.    [provider]  Vitamin D , Ergocalciferol , (DRISDOL ) 50000 units CAPS capsule Take 50,000 Units by mouth every Sunday.    [provider]  Vitamin K , Phytonadione , 100 MCG TABS Take 10 tablets (1,000 mcg total) by mouth daily at 12  noon. 12/24/23   Pasam, Gale Jude, MD      Allergies    Ace inhibitors, Codeine, and Statins    Review of Systems   Review of Systems  Constitutional:  Positive for fatigue.  Gastrointestinal:  Positive for abdominal distention and abdominal pain.  Neurological:  Positive for weakness (Generalized).  All other systems reviewed and are negative.   Physical Exam Updated Vital Signs BP 132/80   Pulse 90   Temp (!) 97.4 F (36.3 C) (Oral)   Resp 14   Ht 5\' 4"  (1.626 m)   Wt 85.3 kg   SpO2 100%   BMI 32.27 kg/m  Physical Exam Vitals and nursing note reviewed.  Constitutional:      General: She is not in acute distress.    Appearance: She is well-developed. She is ill-appearing (Chronically). She is not toxic-appearing or diaphoretic.  HENT:     Head: Normocephalic and atraumatic.     Right Ear: External ear normal.     Left Ear: External ear normal.     Nose: Nose normal.     Mouth/Throat:     Mouth: Mucous membranes are moist.  Eyes:     Extraocular Movements: Extraocular movements intact.     Conjunctiva/sclera: Conjunctivae normal.  Cardiovascular:     Rate and Rhythm: Normal rate and regular rhythm.  Pulmonary:     Effort: Pulmonary effort is normal. No respiratory distress.     Breath sounds: Normal breath sounds.  Abdominal:     General: There is distension.     Palpations: Abdomen is soft.     Tenderness: There is abdominal tenderness. There is no guarding or rebound.  Musculoskeletal:        General: No swelling.     Cervical back: Normal range of motion and neck supple.  Skin:    General: Skin is warm and dry.  Neurological:     General: No focal deficit present.     Mental Status: She is alert and oriented to person, place, and time.  Psychiatric:        Mood and Affect: Mood normal.        Behavior: Behavior normal.     ED Results / Procedures / Treatments   Labs (all labs ordered are listed, but only abnormal results are displayed) Labs Reviewed   COMPREHENSIVE METABOLIC PANEL WITH GFR - Abnormal; Notable for the following components:      Result Value   Sodium 128 (*)    Potassium 6.5 (*)    CO2 14 (*)    BUN 57 (*)    Creatinine, Ser 4.82 (*)    Calcium  8.0 (*)    Total Protein 8.7 (*)    Albumin  <1.5 (*)    AST 307 (*)    ALT 124 (*)    Total Bilirubin 3.3 (*)    GFR, Estimated 9 (*)    All other  components within normal limits  CBC WITH DIFFERENTIAL/PLATELET - Abnormal; Notable for the following components:   RBC 2.22 (*)    Hemoglobin 7.3 (*)    HCT 20.8 (*)    RDW 17.6 (*)    Platelets 80 (*)    nRBC 0.3 (*)    Monocytes Absolute 1.1 (*)    Abs Immature Granulocytes 0.26 (*)    All other components within normal limits  I-STAT CG4 LACTIC ACID, ED - Abnormal; Notable for the following components:   Lactic Acid, Venous 4.2 (*)    All other components within normal limits  RESP PANEL BY RT-PCR (RSV, FLU A&B, COVID)  RVPGX2  CULTURE, BLOOD (ROUTINE X 2)  CULTURE, BLOOD (ROUTINE X 2)  LIPASE, BLOOD  MAGNESIUM   URINALYSIS, ROUTINE W REFLEX MICROSCOPIC  URINALYSIS, W/ REFLEX TO CULTURE (INFECTION SUSPECTED)  I-STAT CG4 LACTIC ACID, ED  POC OCCULT BLOOD, ED  TYPE AND SCREEN  PREPARE RBC (CROSSMATCH)    EKG None  Radiology DG Chest Port 1 View Result Date: 12/30/2023 CLINICAL DATA:  Questionable sepsis - evaluate for abnormality EXAM: PORTABLE CHEST 1 VIEW COMPARISON:  October 14, 2023 FINDINGS: Markedly low lung volumes with bilateral perihilar interstitial opacities. Borderline cardiomegaly. Tortuous aorta. No pleural effusion or pneumothorax. Multilevel thoracic osteophytosis. IMPRESSION: Markedly low lung volumes. Bilateral perihilar interstitial opacities, which may reflect bronchovascular crowding, atypical/viral infection, or interstitial edema. Electronically Signed   By: Rance Burrows M.D.   On: 12/30/2023 14:07   CT ABDOMEN PELVIS WO CONTRAST Result Date: 12/30/2023 CLINICAL DATA:  Abdominal pain,  acute, nonlocalized EXAM: CT ABDOMEN AND PELVIS WITHOUT CONTRAST TECHNIQUE: Multidetector CT imaging of the abdomen and pelvis was performed following the standard protocol without IV contrast. RADIATION DOSE REDUCTION: This exam was performed according to the departmental dose-optimization program which includes automated exposure control, adjustment of the mA and/or kV according to patient size and/or use of iterative reconstruction technique. COMPARISON:  Multiple, most recently October 10, 2023 FINDINGS: Of note, the lack of intravenous contrast limits evaluation of the solid organ parenchyma and vascularity. Lower chest: Subtle ground-glass opacities in the lung bases. No pleural effusion. No cardiomegaly. Hepatobiliary: Cirrhotic liver. Similarly appearing hypodense mass in the inferior right hepatic tip, measuring 3.9 x 4.1 cm (previously 4 x 4 cm).Small radiopaque gallstones in a decompressed gallbladder.No intrahepatic or extrahepatic biliary ductal dilation. Pancreas: No mass or main ductal dilation.No peripancreatic inflammation or fluid collection. Spleen: Normal size. No mass. Adrenals/Urinary Tract: Similar appearance of the multiple bilateral renal cysts. Bilobed soft tissue mass along the upper pole of the right kidney measuring 3.5 cm, likely an adrenal mass. No hydronephrosis or nephrolithiasis. The urinary bladder is completely decompressed. Stomach/Bowel: The stomach is decompressed without focal abnormality. No small bowel wall thickening or inflammation. No small bowel obstruction. Normal appendix. Descending and sigmoid colonic diverticulosis. Persistent small fluid-filled structure in the sigmoid mesocolon (axial 75-78), measuring 2.2 cm, possibly a small fluid collection or a fluid-filled diverticulum. Vascular/Lymphatic: No aortic aneurysm. Diffuse aortoiliac atherosclerosis. No intraabdominal or pelvic lymphadenopathy. Reproductive: Age-related atrophy of the uterus and ovaries. No  concerning adnexal mass. Other: Diffuse mesenteric edema. Large volume ascites. Diffuse anasarca. Paraesophageal and perigastric varices. 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 IMPRESSION: 1. Descending and sigmoid colonic diverticulosis. Subtle inflammatory stranding about the distal sigmoid colon (axial 73-80), worrisome for acute diverticulitis. Persistent small fluid-filled structure in the sigmoid mesocolon (axial 75-78), measuring 2.2 cm, possibly a small peridiverticular abscess or fluid-filled  diverticulum. 2. Cirrhotic liver with sequelae of portal hypertension. Increased mesenteric edema with worsening large volume ascites. 3. Similarly appearing mass in the inferior right hepatic tip measuring 3.9 x 4.1 cm (previously, 4 x 4 cm, by my measurement). 4. Unchanged bilobed mass in the right adrenal gland measuring 3.5 cm. 5. Cholecystolithiasis. Electronically Signed   By: Rance Burrows M.D.   On: 12/30/2023 14:05    Procedures Procedures    Medications Ordered in ED Medications  ondansetron  (ZOFRAN ) injection 4 mg (4 mg Intravenous Given 12/30/23 1043)  sodium chloride  0.9 % bolus 1,000 mL (has no administration in time range)  norepinephrine  (LEVOPHED ) 4mg  in (0.016 mg/mL) premix infusion (2 mcg/min Intravenous New Bag/Given 12/30/23 1245)  metroNIDAZOLE  (FLAGYL ) IVPB 500 mg (500 mg Intravenous New Bag/Given 12/30/23 1345)  vancomycin  (VANCOCIN ) IVPB 1000 mg/200 mL premix (has no administration in time range)  0.9 %  sodium chloride  infusion (Manually program via Guardrails IV Fluids) (has no administration in time range)  albuterol  (PROVENTIL ) (2.5 MG/3ML) 0.083% nebulizer solution (  Not Given 12/30/23 1359)  lactated ringers  bolus 1,000 mL (1,000 mLs Intravenous New Bag/Given 12/30/23 1054)  HYDROmorphone  (DILAUDID ) injection 0.5 mg (0.5 mg Intravenous Given 12/30/23 1055)  sodium zirconium cyclosilicate  (LOKELMA )  packet 10 g (10 g Oral Given 12/30/23 1308)  albuterol  (PROVENTIL ) (2.5 MG/3ML) 0.083% nebulizer solution 10 mg (10 mg Nebulization Given 12/30/23 1358)  insulin  aspart (novoLOG ) injection 5 Units (5 Units Intravenous Given 12/30/23 1237)    And  dextrose  50 % solution 50 mL (50 mLs Intravenous Given 12/30/23 1237)  ceFEPIme  (MAXIPIME ) 2 g in sodium chloride  0.9 % 100 mL IVPB (0 g Intravenous Stopped 12/30/23 1337)  calcium  gluconate 1 g/ 50 mL sodium chloride  IVPB (0 mg Intravenous Stopped 12/30/23 1341)    ED Course/ Medical Decision Making/ A&P                                 Medical Decision Making Amount and/or Complexity of Data Reviewed Labs: ordered. Radiology: ordered.  Risk OTC drugs. Prescription drug management. Decision regarding hospitalization.   This patient presents to the ED for concern of abdominal pain, abnormal lab results, this involves an extensive number of treatment options, and is a complaint that carries with it a high risk of complications and morbidity.  The differential diagnosis includes worsening cancer, SBP, diverticulitis, other intra-abdominal infection, symptomatic anemia   Co morbidities that complicate the patient evaluation  DM, HTN, HLD, anemia, cirrhosis, autoimmune hepatitis, metastatic hepatocellular carcinoma   Additional history obtained:  Additional history obtained from patient's family External records from outside source obtained and reviewed including EMR   Lab Tests:  I Ordered, and personally interpreted labs.  The pertinent results include: Acute on chronic anemia, no leukocytosis, slight worsening of thrombocytopenia, acute on chronic renal failure, worsened acidosis, persistent elevations in transaminases, hyperkalemia, hyponatremia, hypocalcemia   Imaging Studies ordered:  I ordered imaging studies including x-ray, CT of abdomen and pelvis I independently visualized and interpreted imaging which showed findings concerning  for acute diverticulitis of distal sigmoid colon, 2.2 cm peridiverticular abscess or fluid-filled diverticulum, large volume ascites I agree with the radiologist interpretation   Cardiac Monitoring: / EKG:  The patient was maintained on a cardiac monitor.  I personally viewed and interpreted the cardiac monitored which showed an underlying rhythm of: Sinus rhythm   Problem List / ED Course / Critical interventions / Medication management  Patient presents for abnormal lab work.  She was seen by oncology today and had outpatient lab work done.  She was advised to come for her drop in hemoglobin, which is now at 7.8 from a baseline of 8.5-10.  Patient states that she has never had a blood transfusion before.  She was having ongoing abnormal uterine bleeding which has resolved following initiation of vitamin K  supplements.  She denies any other recent known blood loss.  Per chart review, lab work also concerning for acute on chronic renal failure with hyperkalemia.  Will repeat BMP for confirmation.  In the meantime, IV fluids ordered.  Dilaudid  was ordered for analgesia.  Given her worsened abdominal pain, will obtain CT imaging.  Lab work confirms hyperkalemia.  Temporizing medications were ordered.  Hemoglobin is also worsened since earlier today.  Given her cephalexin sugars, 1 unit PRBCs was ordered.  Lactate was elevated concerning for possible sepsis.  Broad-spectrum antibiotics were ordered.  On reassessment, pain has improved.  She continues to deny any nausea.  CT scan shows findings consistent with diverticulitis.  Ultrasound-guided paracentesis was ordered with orders for ascitic fluid analysis.  Dose of albumin  was ordered.  She was started on 2 mcg of Levophed .  I suspect her blood pressure will improve with further oncotic pressure support with albumin  and PRBCs.  I spoke with hospitalist who did not feel comfortable admitting.  I spoke with [facility] with critical care who will admit. I ordered  medication including IV fluids and broad-spectrum antibiotics for empiric treatment of possible sepsis; Lokelma , calcium  gluconate, albuterol , insulin /dextrose  for hyperkalemia; Dilaudid  for analgesia; PRBCs for acute on chronic anemia; Levophed  for hypotension; albumin  for hypoalbuminemia Reevaluation of the patient after these medicines showed that the patient improved I have reviewed the patients home medicines and have made adjustments as needed   Consultations Obtained:  I requested consultation with the intensivist,  and discussed lab and imaging findings as well as pertinent plan - they recommend: Admission to ICU   Social Determinants of Health:  Has access to outpatient care   CRITICAL CARE Performed by: Iva Mariner   Total critical care time: 34 minutes  Critical care time was exclusive of separately billable procedures and treating other patients.  Critical care was necessary to treat or prevent imminent or life-threatening deterioration.  Critical care was time spent personally by me on the following activities: development of treatment plan with patient and/or surrogate as well as nursing, discussions with consultants, evaluation of patient's response to treatment, examination of patient, obtaining history from patient or surrogate, ordering and performing treatments and interventions, ordering and review of laboratory studies, ordering and review of radiographic studies, pulse oximetry and re-evaluation of patient's condition.         Final Clinical Impression(s) / ED Diagnoses Final diagnoses:  Diverticulitis  Hyperkalemia  Acute renal failure superimposed on chronic kidney disease, unspecified acute renal failure type, unspecified CKD stage (HCC)  Symptomatic anemia    Rx / DC Orders ED Discharge Orders     None         Iva Mariner, MD 12/30/23 [phone]

## 2023-12-30 NOTE — ED Notes (Signed)
 Pt admitted, called ICU and made aware of the critical lactic.

## 2023-12-30 NOTE — ED Triage Notes (Signed)
 Pt arrives POV from clinic at [facility]. Elevated K, bili. Hgb 7.8. Transfer d/t uncontrolled pai, possible plasma and blood transfusion, paracentesis. Last paracentesis Weds, last chemo 2 weeks ago

## 2023-12-30 NOTE — Assessment & Plan Note (Addendum)
 She recently had postmenopausal bleeding. No associated abdominal pain, dizziness, or lightheadedness.  She had GYN evaluation but hysteroscopy could not be performed because of narrow os.  Her previous child deliveries were via C-section.  -Labs did show evidence of coagulopathy on 12/17/2023, likely from hepatocellular carcinoma and her history of autoimmune hepatitis.  Hence she was started on vitamin K  supplementation and uterine bleeding has stopped now.

## 2023-12-30 NOTE — Assessment & Plan Note (Addendum)
 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 Confirmed via MRI. Complicated by autoimmune hepatitis.  Her case was presented at GI tumor conference by another provider on 10/16/2023.  It was determined that she is not a candidate for resection.  Plan was to obtain PET/CT following which a referral for liver directed therapy including Y90.  Also radiation oncology referral was to be considered for possible SBRT to adrenal lesion.  PET scan performed on 10/25/2023.  It did not show increased FDG activity in the liver lesion or in the adrenal lesion.  Her case was discussed again in tumor conference on 11/06/2023.  Recommendation made to refer her for possible liver transplant evaluation.  Liver transplantation team at [facility] reviewed her imaging studies and are concerned about clinically picture indicating metastatic disease to adrenal gland.  She was determined to be not a candidate for liver transplant.  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   24-hour urine metanephrine analysis on 11/19/2023 showed 176 mcg of metanephrines in 24-hour urine, which is within normal range, not indicative of pheochromocytoma.   Plan made for systemic treatment with tremelimumab  plus durvalumab .  Started from 11/19/2023.  Her history of autoimmune hepatitis could complicate immunotherapy and we will closely monitor her for this.  She was referred to interventional radiology department regarding possibility of Y90 or other liver directed treatments.  Appointment pending.  Proceeded with cycle 2 of immunotherapy with durvalumab  on 12/17/2023 and this will be continued every 4 weeks.  She has an appointment coming up with radiation oncology department for possible SBRT to the adrenal lesion, now that it looks like a metastatic lesion and not pheochromocytoma.  Labs did show evidence of coagulopathy on 12/17/2023 and again today, likely from hepatocellular carcinoma  and her history of autoimmune hepatitis.  Started on vitamin K  last week, which has helped stop her uterine bleeding.  Her next treatment with durvalumab  is due approximately in a week.  Will monitor her progress during the hospital stay and adjust dates as needed

## 2023-12-30 NOTE — Progress Notes (Addendum)
 [facility]  ONCOLOGY CLINIC PROGRESS NOTE   Patient Care Team: Elester Grim, MD as PCP - General (Internal Medicine) Baldo Bonds, MD as Consulting Physician (Gastroenterology) Lutricia Salts Jordis Nevins, RN as Oncology Nurse Navigator  PATIENT NAME: Dana Thornton   MR#: [id number] DOB: June 26, 1956  Date of visit: 12/30/2023   ASSESSMENT & PLAN:   Dana Thornton is a 68 y.o. pleasant lady with a past medical history of autoimmune hepatitis, for which she was seeing gastroenterologist Dr. Honey Lusty, diabetes mellitus, hypertension, dyslipidemia, was referred to our clinic in February 2025 for newly diagnosed hepatocellular carcinoma, noted on MRI abdomen.    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 Confirmed via MRI. Complicated by autoimmune hepatitis.  Her case was presented at GI tumor conference by another provider on 10/16/2023.  It was determined that she is not a candidate for resection.  Plan was to obtain PET/CT following which a referral for liver directed therapy including Y90.  Also radiation oncology referral was to be considered for possible SBRT to adrenal lesion.  PET scan performed on 10/25/2023.  It did not show increased FDG activity in the liver lesion or in the adrenal lesion.  Her case was discussed again in tumor conference on 11/06/2023.  Recommendation made to refer her for possible liver transplant evaluation.  Liver transplantation team at [facility] reviewed her imaging studies and are concerned about clinically picture indicating metastatic disease to adrenal gland.  She was determined to be not a candidate for liver transplant.  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   24-hour urine metanephrine analysis on 11/19/2023 showed 176 mcg of metanephrines in 24-hour urine, which is within normal range, not indicative of pheochromocytoma.   Plan made  for systemic treatment with tremelimumab  plus durvalumab .  Started from 11/19/2023.  Her history of autoimmune hepatitis could complicate immunotherapy and we will closely monitor her for this.  She was referred to interventional radiology department regarding possibility of Y90 or other liver directed treatments.  Appointment pending.  Proceeded with cycle 2 of immunotherapy with durvalumab  on 12/17/2023 and this will be continued every 4 weeks.  She has an appointment coming up with radiation oncology department for possible SBRT to the adrenal lesion, now that it looks like a metastatic lesion and not pheochromocytoma.  Labs did show evidence of coagulopathy on 12/17/2023 and again today, likely from hepatocellular carcinoma and her history of autoimmune hepatitis.  Started on vitamin K  last week, which has helped stop her uterine bleeding.  Her next treatment with durvalumab  is due approximately in a week.  Will monitor her progress during the hospital stay and adjust dates as needed  Abnormal uterine bleeding She recently had postmenopausal bleeding. No associated abdominal pain, dizziness, or lightheadedness.  She had GYN evaluation but hysteroscopy could not be performed because of narrow os.  Her previous child deliveries were via C-section.  -Labs did show evidence of coagulopathy on 12/17/2023, likely from hepatocellular carcinoma and her history of autoimmune hepatitis.  Hence she was started on vitamin K  supplementation and uterine bleeding has stopped now.  Ascites Recurrent ascites likely secondary to liver cirrhosis and also malignancy driven.  She had paracentesis on 11/08/2023 with removal of 1.8 L of ascitic fluid.  Cytology was negative for malignant cells.  She had another paracentesis on 12/25/2023 and 3 L of ascitic fluid was drained.    Recurrent ascites  with abdominal distention and discomfort, rapidly reaccumulating post-paracentesis, likely due to liver cancer. Relief noted  when supine. Concerns include potential hepatic circulation clot and need for imaging to rule out other pain causes. Persistent ascites may require PleurX catheter for home drainage, with infection risk necessitating careful management.  - We directed her to the ED for abdominal pain evaluation, paracentesis and further imaging.  -Given rapid reaccumulation of ascites, consider PleurX catheter placement for home drainage  Coagulopathy (HCC) Coagulopathy likely secondary to liver dysfunction and/or cancer, with recent uterine bleeding controlled by vitamin K  supplementation. Liver pathology may exacerbate coagulation issues. - Continue vitamin K  supplementation - Monitor coagulation parameters - Administer FFP if any recurrence of bleeding or especially if undergoing procedures like paracentesis or Pleurx catheter placement  Normocytic anemia Anemia with hemoglobin at 7.8 g/dL, decreased from 8.7 g/dL, likely multifactorial due to chronic disease and recent uterine bleeding. Reports shortness of breath, potentially related to anemia or ascites. Current hemoglobin level is borderline for transfusion, but symptoms and further drop may necessitate intervention. - Monitor hemoglobin levels and transfuse to keep hemoglobin closer to 8. - Consider red blood cell transfusion if hemoglobin drops further or if significant symptoms occur  ADDENDUM: AKI, Elevated LFTs Her CMP resulted after we sent her to the ED.  Creatinine was elevated and LFTs were all abnormal also, raising concern for sepsis, shock or dehydration.  This will need to be addressed during hospitalization.   I reviewed lab results and outside records for this visit and discussed relevant results with the patient. Diagnosis, plan of care and treatment options were also discussed in detail with the patient. Opportunity provided to ask questions and answers provided to her apparent satisfaction. Provided instructions to call our clinic with  any problems, questions or concerns prior to return visit. I recommended to continue follow-up with PCP and sub-specialists. She verbalized understanding and agreed with the plan.   NCCN guidelines have been consulted in the planning of this patient's care.  I spent a total of 45 minutes during this encounter with the patient including review of chart and various tests results, discussions about plan of care and coordination of care plan.   Arlo Berber, MD  12/30/2023 11:01 AM  [facility] - A DEPT OF [facility] [address] [place] [address] Dept: [phone] Dept Fax: [phone]    CHIEF COMPLAINT/ REASON FOR VISIT:   Hepatocellular carcinoma with possible metastatic disease to right adrenal gland.  Current Treatment: Plan made for systemic treatment with tremelimumab  plus durvalumab .  Started from 11/19/2023.  Also referred for liver directed therapy with Y90.  INTERVAL HISTORY:   Discussed the use of AI scribe software for clinical note transcription with the patient, who gave verbal consent to proceed.  History of Present Illness Dana Thornton is a 68 year old female with liver cancer who presents with abdominal distention and discomfort.  After a paracentesis on Tuesday where approximately three liters of fluid were removed, she began experiencing abdominal distention again by Friday. The distention became visibly apparent by Saturday, accompanied by discomfort and pain, particularly when not lying down.  She has a history of uterine bleeding issues, which have been managed with vitamin K  pills. She started taking these pills two to three days ago, with a dosage of ten pills a day, which has helped stop the bleeding.  Her hemoglobin level has dropped from 8.7 to 7.8, indicating a decrease  of about a gram. She experiences shortness of breath, which she describes as 'it's her heart', and finds  some relief when lying down.  She has undergone three previous abdominal taps, but the fluid accumulation recurs quickly. There is concern about the possibility of a clot in the liver area contributing to her symptoms.  Her platelet count is stable at 104, but her blood is not clotting well, potentially due to her liver cancer and hepatitis. She has been experiencing increased discomfort and pain due to the abdominal distention.   I have reviewed the past medical history, past surgical history, social history and family history with the patient and they are unchanged from previous note.  HISTORY OF PRESENT ILLNESS:   ONCOLOGY HISTORY:   She presented to the ED on 10/10/2023 due to abdominal pain that was radiating to her back and abnormal findings on the CT scan that was ordered by her primary physician.  CT of abdomen pelvis on 10/10/2023 showed heterogeneously enhancing 4 x 4 cm right hepatic lobe mass in segment 6, highly suspicious for hepatoma.  MRI recommended for further evaluation.  Also noted was heterogeneously enhancing 3.8 x 2.2 cm right adrenal gland mass concerning for metastatic disease.  Therefore oncology consult was requested when the patient was hospitalized.  Patient was evaluated by Dr. Arno Bibles.   AFP was significantly elevated at 458 ng/mL on 10/10/2023.   Medical history includes hypertension, diabetes, history of gallstones and cirrhosis for which she sees her primary GI Dr. Honey Lusty.   Patient has underlying history of cirrhosis however never has been decompensated and did not require paracentesis, variceal ligation or had history of encephalopathy.  She follows up with Dr. Honey Lusty in gastroenterology.   On 10/13/2023, MRI abdomen with and without contrast was obtained for further evaluation.  Examination was limited by breath motion artifact.  There was subtly hyperenhancing mass of inferior right lobe of liver, hepatic segment 6 with clear evidence of washout but without  capsular enhancement, measuring 3.4 x 3.3 cm.  This was consistent with hepatocellular carcinoma, LI-RADS category 5.  Also noted was heterogeneously enhancing mass of the right adrenal gland measuring 4.1 x 2.3 cm, new compared to March 2019 and highly concerning for malignancy.  Isolated adrenal metastatic hepatocellular carcinoma is rare but has been reported in the literature.  Cirrhosis and small volume ascites was noted.  Anasarca noted.   On 10/14/2023, CT chest without contrast showed no evidence of intrathoracic metastatic disease.   Her case was presented at GI tumor conference by Dr. Arno Bibles on 10/16/2023.  It was determined that she is not a candidate for resection.  Plan was to obtain PET/CT following which a referral for liver directed therapy including Y90.  Also radiation oncology referral was to be considered for possible SBRT to adrenal lesion.   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   24-hour urine metanephrine analysis on 11/19/2023 showed 176 mcg of metanephrines in 24-hour urine, which is within normal range, not indicative of pheochromocytoma.  She was determined to be not a candidate for liver transplant because of the adrenal lesion.   Plan made for systemic treatment with tremelimumab  plus durvalumab .  Started from 11/19/2023.    Her history of autoimmune hepatitis could complicate immunotherapy and we will closely monitor her for this.    Oncology History  Hepatocellular carcinoma (HCC)  10/29/2023 Initial Diagnosis   Hepatocellular carcinoma (HCC)   10/29/2023 Cancer Staging   Staging form: Liver, AJCC 8th  Edition - Clinical: Stage IVB (cT1b, cN0, cM1) - Signed by Arlo Berber, MD on 10/29/2023 Stage prefix: Initial diagnosis   11/19/2023 -  Chemotherapy   Patient is on Treatment Plan : Hepatocellular Carcinoma Tremelimumab -actl C1 D1 + Durvalumab  q28d          REVIEW OF SYSTEMS:   Review of Systems - Oncology  All other pertinent systems were  reviewed with the patient and are negative.  ALLERGIES: She is allergic to ace inhibitors, codeine, and statins.  MEDICATIONS:  No current facility-administered medications for this visit.   Current Outpatient Medications  Medication Sig Dispense Refill   ALEVE 220 MG tablet Take 220-440 mg by mouth 2 (two) times daily as needed (for pain or headaches).     amLODipine  (NORVASC ) 10 MG tablet Take 1 tablet (10 mg total) by mouth daily. 30 tablet 1   carvedilol  (COREG ) 25 MG tablet Take 25 mg by mouth 2 (two) times daily with a meal.     ezetimibe  (ZETIA ) 10 MG tablet Take 10 mg by mouth daily.     furosemide (LASIX) 40 MG tablet Take 40 mg by mouth daily as needed for fluid or edema.     insulin  lispro (HUMALOG KWIKPEN) 100 UNIT/ML KwikPen Inject 10 Units into the skin 3 (three) times daily with meals as needed (AS DIRECTED FOR AN ELEVATED BGL).     levothyroxine  (SYNTHROID ) 300 MCG tablet Take 150-300 mcg by mouth See admin instructions. Take 150 mcg by mouth 30 minutes before breakfast on Sunday and Saturday and 300 mcg on Mon/Tues/Wed/Thurs/Fri     losartan (COZAAR) 100 MG tablet Take 100 mg by mouth daily.     ondansetron  (ZOFRAN ) 8 MG tablet Take 1 tablet (8 mg total) by mouth every 8 (eight) hours as needed for nausea or vomiting. 30 tablet 1   predniSONE  (DELTASONE ) 10 MG tablet Take 2 tablets (20 mg) daily with food for 4 days, then 1 tablet (10 mg) daily with food until seen by your GI doctor (Patient taking differently: Take 10 mg by mouth daily with breakfast.) 30 tablet 1   prochlorperazine  (COMPAZINE ) 10 MG tablet Take 1 tablet (10 mg total) by mouth every 6 (six) hours as needed for nausea or vomiting. 30 tablet 1   triamcinolone cream (KENALOG) 0.1 % Apply 1 Application topically 2 (two) times daily as needed (for irritation, as directed- affected area).     TRULICITY 0.75 MG/0.5ML SOAJ Inject 0.75 mg into the skin every Sunday.     Vitamin D , Ergocalciferol , (DRISDOL ) 50000 units  CAPS capsule Take 50,000 Units by mouth every Sunday.     Vitamin K , Phytonadione , 100 MCG TABS Take 10 tablets (1,000 mcg total) by mouth daily at 12 noon. 50 tablet 0   insulin  glargine (LANTUS ) 100 UNIT/ML injection 30  Units sq Qhs and 12 units q am (Patient not taking: Reported on 11/12/2023) 10 mL 2   mercaptopurine  (PURINETHOL ) 50 MG tablet Take 50 mg by mouth daily. Give on an empty stomach 1 hour before or 2 hours after meals. Caution: Chemotherapy. (Patient not taking: Reported on 12/30/2023)     ondansetron  (ZOFRAN ) 4 MG tablet Take 4 mg by mouth every 6 (six) hours as needed for nausea or vomiting. (Patient not taking: Reported on 12/30/2023)     Facility-Administered Medications Ordered in Other Visits  Medication Dose Route Frequency Provider Last Rate Last Admin   ondansetron  (ZOFRAN ) injection 4 mg  4 mg Intravenous Q6H PRN Iva Mariner, MD  4 mg at 12/30/23 1043     VITALS:   Blood pressure 94/65, pulse (!) 103, temperature 98 F (36.7 C), temperature source Temporal, resp. rate 20, height 5\' 2"  (1.575 m), weight 188 lb 11.2 oz (85.6 kg), SpO2 100%.  Wt Readings from Last 3 Encounters:  12/30/23 188 lb (85.3 kg)  12/30/23 188 lb 11.2 oz (85.6 kg)  12/17/23 208 lb 8 oz (94.6 kg)    Body mass index is 34.51 kg/m.    Onc Performance Status - 12/30/23 0851       ECOG Perf Status   ECOG Perf Status Restricted in physically strenuous activity but ambulatory and able to carry out work of a light or sedentary nature, e.g., light house work, office work      KPS SCALE   KPS % SCORE Normal activity with effort, some s/s of disease              PHYSICAL EXAM:   Physical Exam Constitutional:      General: She is in acute distress (from abdominal pain).     Appearance: She is ill-appearing.  HENT:     Head: Normocephalic and atraumatic.  Eyes:     General: No scleral icterus.    Conjunctiva/sclera: Conjunctivae normal.  Cardiovascular:     Rate and Rhythm: Normal  rate and regular rhythm.     Heart sounds: Normal heart sounds.  Pulmonary:     Effort: Pulmonary effort is normal.     Breath sounds: Normal breath sounds.  Abdominal:     General: There is distension.  Musculoskeletal:     Right lower leg: No edema.     Left lower leg: No edema.  Neurological:     General: No focal deficit present.     Mental Status: She is alert and oriented to person, place, and time.  Psychiatric:        Mood and Affect: Mood normal.        Behavior: Behavior normal.        Thought Content: Thought content normal.       LABORATORY DATA:   I have reviewed the data as listed.  Results for orders placed or performed in visit on 12/30/23  Ferritin  Result Value Ref Range   Ferritin 342 (H) 11 - 307 ng/mL  Fibrinogen   Result Value Ref Range   Fibrinogen  133 (L) 210 - 475 mg/dL  APTT  Result Value Ref Range   aPTT 52 (H) 24 - 36 seconds  Protime-INR  Result Value Ref Range   Prothrombin Time 30.0 (H) 11.4 - 15.2 seconds   INR 2.8 (H) 0.8 - 1.2  CMP (Cancer Center only)  Result Value Ref Range   Sodium 127 (L) 135 - 145 mmol/L   Potassium 6.3 (HH) 3.5 - 5.1 mmol/L   Chloride 106 98 - 111 mmol/L   CO2 16 (L) 22 - 32 mmol/L   Glucose, Bld 91 70 - 99 mg/dL   BUN 56 (H) 8 - 23 mg/dL   Creatinine 1.91 (H) 4.78 - 1.00 mg/dL   Calcium  8.5 (L) 8.9 - 10.3 mg/dL   Total Protein 9.5 (H) 6.5 - 8.1 g/dL   Albumin  <1.5 (L) 3.5 - 5.0 g/dL   AST 295 (HH) 15 - 41 U/L   ALT 124 (H) 0 - 44 U/L   Alkaline Phosphatase 101 38 - 126 U/L   Total Bilirubin 3.6 (HH) 0.0 - 1.2 mg/dL   GFR, Estimated 10 (L) >60 mL/min  Anion gap 5 5 - 15  CBC with Differential (Cancer Center Only)  Result Value Ref Range   WBC Count 8.9 4.0 - 10.5 K/uL   RBC 2.38 (L) 3.87 - 5.11 MIL/uL   Hemoglobin 7.8 (L) 12.0 - 15.0 g/dL   HCT 16.1 (L) 09.6 - 04.5 %   MCV 90.3 80.0 - 100.0 fL   MCH 32.8 26.0 - 34.0 pg   MCHC 36.3 (H) 30.0 - 36.0 g/dL   RDW 40.9 (H) 81.1 - 91.4 %   Platelet  Count 104 (L) 150 - 400 K/uL   nRBC 0.0 0.0 - 0.2 %   Neutrophils Relative % 56 %   Neutro Abs 5.0 1.7 - 7.7 K/uL   Lymphocytes Relative 30 %   Lymphs Abs 2.7 0.7 - 4.0 K/uL   Monocytes Relative 10 %   Monocytes Absolute 0.9 0.1 - 1.0 K/uL   Eosinophils Relative 2 %   Eosinophils Absolute 0.2 0.0 - 0.5 K/uL   Basophils Relative 1 %   Basophils Absolute 0.1 0.0 - 0.1 K/uL   Immature Granulocytes 1 %   Abs Immature Granulocytes 0.10 (H) 0.00 - 0.07 K/uL  Sample to Blood Bank(Blood Bank Hold)  Result Value Ref Range   Blood Bank Specimen SAMPLE AVAILABLE FOR TESTING    Sample Expiration      12/29/2023,2359 Performed at [facility], [address] [place], [place] [address]   ABO/RH  Result Value Ref Range   ABO/RH(D)      A POS Performed at [facility], [address] [place], [place] [address]     RADIOGRAPHIC STUDIES:  I have personally reviewed the radiological images as listed and agree with the findings in the report.  IR Paracentesis Result Date: 12/25/2023 INDICATION: 38-year-old female with history of hepatocellular carcinoma, with recurrent ascites. Interventional radiology was requested for therapeutic paracentesis. EXAM: ULTRASOUND GUIDED THERAPEUTIC PARACENTESIS MEDICATIONS: 25 cc of 1% lidocaine  COMPLICATIONS: None immediate. PROCEDURE: Informed written consent was obtained from the patient after a discussion of the risks, benefits and alternatives to treatment. A timeout was performed prior to the initiation of the procedure. Initial ultrasound scanning demonstrates a large amount of ascites within the right lower abdominal quadrant. The right lower abdomen was prepped and draped in the usual sterile fashion. 1% lidocaine  was used for local anesthesia. Following this, a 6 Fr Safe-T-Centesis catheter was introduced. An ultrasound image was saved for documentation purposes. The paracentesis was performed. The catheter was removed  and a dressing was applied. The patient tolerated the procedure well without immediate post procedural complication. FINDINGS: A total of approximately 3.0 L of clear, straw-colored peritoneal fluid was removed. IMPRESSION: Successful ultrasound-guided paracentesis yielding 3.0 liters of peritoneal fluid. Procedure performed by Lambert Pillion, PA-C Electronically Signed   By: Nicoletta Barrier M.D.   On: 12/25/2023 17:02    CODE STATUS:  Code Status History     Date Active Date Inactive Code Status Order ID Comments User Context   10/10/2023 1942 10/15/2023 1540 Full Code [id number]  Kenny Peals, MD ED   11/12/2017 1638 11/17/2017 1853 Full Code [id number]  Adrienne Horning, MD ED   06/27/2017 1007 07/03/2017 1735 Full Code [id number]  Carmelo Chock, NP ED   04/09/2015 2033 04/13/2015 1554 Full Code [id number]  Maylene Spear, MD Inpatient   03/29/2015 0300 04/01/2015 1411 Full Code [id number]  Chucky Craver, MD Inpatient   12/03/2011 0340 12/10/2011 1759 Full Code [id number]  Deterding, Leitha Purl,  MD Inpatient   12/03/2011 0234 12/03/2011 0340 Full Code [id number]  Lewis Red, MD ED    Questions for Most Recent Historical Code Status (Order [id number])     Question Answer   By: Consent: discussion documented in EHR                Advance Directive Documentation    Flowsheet Row Most Recent Value  Type of Advance Directive Healthcare Power of Attorney, Living will  Pre-existing out of facility DNR order (yellow form or pink MOST form) --  "MOST" Form in Place? --       No orders of the defined types were placed in this encounter.    Future Appointments  Date Time Provider Department Center  12/30/2023 12:00 PM [facility] INFUSION [facility] None  12/31/2023  8:30 AM [facility] NURSE [facility] None  12/31/2023  9:00 AM Johna Myers, MD [facility] None  01/14/2024  8:15 AM [facility] PHLEBOTOMIST [facility] None  01/14/2024  8:45 AM Havilah Topor, Gale Jude, MD [facility] None  01/14/2024  9:30  AM [facility] INFUSION [facility] None  02/11/2024  8:15 AM [facility] PHLEBOTOMIST [facility] None  02/11/2024  8:45 AM Lafern Brinkley, Gale Jude, MD [facility] None  02/11/2024  9:30 AM [facility] INFUSION [facility] None     This document was completed utilizing speech recognition software. Grammatical errors, random word insertions, pronoun errors, and incomplete sentences are an occasional consequence of this system due to software limitations, ambient noise, and hardware issues. Any formal questions or concerns about the content, text or information contained within the body of this dictation should be directly addressed to the provider for clarification.

## 2023-12-30 NOTE — ED Notes (Signed)
 Provider at bedside

## 2023-12-30 NOTE — Progress Notes (Addendum)
 eLink Physician-Brief Progress Note Patient Name: Dana Thornton DOB: Feb 07, 1956 MRN: [id number]   Date of Service  12/30/2023  HPI/Events of Note  68 y.o. metastatic cellular carcinoma presents with abnormal labs including significant hyperkalemia, renal failure admitted with hypotension on vasopressors.   eICU Interventions  Lactate somewhat uptrending.  Will repeat in AM.  Remaining TLS labs with no concern for worsening.     2340 -patient in extreme pain in setting of liver and kidney dysfunction.  No PRNs are ordered and she is NPO.  Add Dilaudid  sliding scale.  Temperature 96.7-no indication for beta-blocker at this time.  Intervention Category Major Interventions: Shock - evaluation and management  Merdith Boyd 12/30/2023, 9:53 PM

## 2023-12-30 NOTE — Assessment & Plan Note (Addendum)
 Coagulopathy likely secondary to liver dysfunction and/or cancer, with recent uterine bleeding controlled by vitamin K  supplementation. Liver pathology may exacerbate coagulation issues. - Continue vitamin K  supplementation - Monitor coagulation parameters - Administer FFP if any recurrence of bleeding or especially if undergoing procedures like paracentesis or Pleurx catheter placement

## 2023-12-30 NOTE — ED Notes (Signed)
 Patient's husband signed the consent for blood transfusion.

## 2023-12-30 NOTE — ED Notes (Signed)
 Patient appears awake, alert and oriented. No signs of respiratory distress. Breathing even and unlabored.

## 2023-12-30 NOTE — ED Notes (Signed)
 Patient shows no signs of allergic reaction from whole blood.

## 2023-12-30 NOTE — Assessment & Plan Note (Addendum)
 Anemia with hemoglobin at 7.8 g/dL, decreased from 8.7 g/dL, likely multifactorial due to chronic disease and recent uterine bleeding. Reports shortness of breath, potentially related to anemia or ascites. Current hemoglobin level is borderline for transfusion, but symptoms and further drop may necessitate intervention. - Monitor hemoglobin levels and transfuse to keep hemoglobin closer to 8. - Consider red blood cell transfusion if hemoglobin drops further or if significant symptoms occur

## 2023-12-30 NOTE — Progress Notes (Signed)
 Uric acid 11.2, no sign of hyperkalemia known tumor burden concern for tumor lysis.  With renal failure.  Rasburicase  ordered.

## 2023-12-30 NOTE — Progress Notes (Signed)
 CRITICAL VALUE STICKER  CRITICAL VALUE: K+ 6.3 total bilirubin 3.6 AST 294 and HgB 7.8 RECEIVER (on-site recipient of call):  DATE & TIME NOTIFIED: 0910 12/30/23  MESSENGER (representative from lab): Rhesa Celeste  MD NOTIFIED: Pasam  TIME OF NOTIFICATION:0910 12/30/23  RESPONSE:  Patient  planning to visit the [facility] Emergency Department.

## 2023-12-30 NOTE — ED Notes (Signed)
 Critical Lactic acid 6.3. MD Hunsucker made aware.

## 2023-12-30 NOTE — H&P (Signed)
 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 Noted to have recurrence of ascites despite 3L para 4/16. She was started on abx, given IVF and ultimately started on 2mcg NE.  Add'l labs w  LA 4.2, Albumin  <1.5 and repeat CBC w plt drop to 80 from 104 a few hours earlier repeat hgb 7.3 from 7.8  PCCM called for admission in this setting    Pertinent  Medical History  Metastatic hepatocellular carcinoma AI hepatitis Hepatic cirrhosis w ascites Abnormal uterine bleeding   Significant Hospital Events: Including procedures, antibiotic start and stop dates in addition to other pertinent events   4/21   Interim History / Subjective:  To receive 1 PRBC   Objective   Blood pressure 104/77, pulse 91, temperature (!) 97.4 F (36.3 C), temperature source Oral, resp. rate 11, height 5\' 4"  (1.626 m), weight 85.3 kg, SpO2 100%.        Intake/Output Summary (Last 24 hours) at 12/30/2023 1454 Last data filed at 12/30/2023 1445 Gross per 24 hour  Intake 547.36 ml  Output --  Net 547.36 ml   Filed Weights   12/30/23 1059  Weight: 85.3 kg    Examination: General: chronically and acutely ill F moaning in pain  HENT: NCAT  Lungs: CTAb  Cardiovascular: tachycardic, regular  Abdomen: round distended generalized tenderness w/o rebound tenderness. Soft   Extremities: no acute joint deformity  Neuro: Lethargic, disoriented.  GU: no foley   Resolved Hospital Problem list     Assessment & Plan:   Acute encephalopathy -supportive care as below + check ammonia   Advanced stage hepatocellular carcinoma mets to R adrenal gland (on immunotherapy)  Autoimmune hepatitis Hepatic cirrhosis w recurrent ascites -- large volume ascites 4/21 Portal hypertension Coagulopathy Anemia  Thrombocytopenia Hyperbilirubinemia P -EM has ordered IR para  -looks like onc plans to follow inpt  -receiving 1 PRBC  -ordering  1 FFP -restart Vit K (on 1000mcg daily outpt, will be on 2.5mg  daily here given formulary availability)  -CBC + INR 20:00  -outpt notes reviewed -- appreciate consideration re pleurX -- would favor this being addressed when we have r/o problems like sepsis & she is otherwise more stable. Note that pt is not a txp candidate    Hypotension  -hypovolemic v sepsis v acidosis mediated vasodilation  P -periph NE for SBP > 90 -broad abx -- vanc zosyn . If MRSA PCR neg dc vanc.   -cont IVF, albumin    Lactic acidosis -hypotension 2/2 above + hepatic dysfxn P -trend   AKI on CKD Metabolic acidosis Hyperkalemia Hyponatremia  P -cont IVF, agree w albumin   + crystalloid -has rcvd albuterol , 1g Calglu, insulin  + dextrose , Kayexalate  -q6hr K  -check uric acid, LDH   Descending and sigmoid diverticulosis w possible acute diverticulitis  Possible small peridiverticular abscess vs  fluid filled diverticulum  Cholecystolithiasis  P -zosyn   -consider CCS consult in AM-- d/w pccm md   DM -SSI   Hypothyroidism -hold synthroid , consider restart in AM   HTN -holding home meds   Full code -d/w husband, and 2 adult children 4/21   Best Practice (right click and "Reselect all SmartList Selections" daily)   Diet/type: NPO DVT prophylaxis SCD Pressure ulcer(s): N/A GI prophylaxis: N/A Lines: N/A Foley:  N/A Code Status:  full  code Last date of multidisciplinary goals of care discussion [4/21]  Labs   CBC: Recent Labs  Lab 12/30/23 0817 12/30/23 1049  WBC 8.9 9.7  NEUTROABS 5.0 6.0  HGB 7.8* 7.3*  HCT 21.5* 20.8*  MCV 90.3 93.7  PLT 104* 80*    Basic Metabolic Panel: Recent Labs  Lab 12/30/23 0817 12/30/23 1049  NA 127* 128*  K 6.3* 6.5*  CL 106 107  CO2 16* 14*  GLUCOSE 91 97  BUN 56* 57*  CREATININE 4.73* 4.82*  CALCIUM  8.5* 8.0*  MG  --  2.1   GFR: Estimated Creatinine Clearance: 12 mL/min (A) (by C-G formula based on SCr of 4.82 mg/dL (H)). Recent Labs  Lab 12/30/23 0817 12/30/23 1049 12/30/23 1248  WBC 8.9 9.7  --   LATICACIDVEN  --   --  4.2*    Liver Function Tests: Recent Labs  Lab 12/30/23 0817 12/30/23 1049  AST 294* 307*  ALT 124* 124*  ALKPHOS 101 89  BILITOT 3.6* 3.3*  PROT 9.5* 8.7*  ALBUMIN  <1.5* <1.5*   Recent Labs  Lab 12/30/23 1049  LIPASE 49   No results for input(s): "AMMONIA" in the last 168 hours.  ABG    Component Value Date/Time   PHART 7.401 06/30/2017 1055   PCO2ART 33.4 06/30/2017 1055   PO2ART 58.3 (L) 06/30/2017 1055   HCO3 20.3 06/30/2017 1055   TCO2 18 (L) 06/27/2017 0510   ACIDBASEDEF 3.7 (H) 06/30/2017 1055   O2SAT 89.3 06/30/2017 1055     Coagulation Profile: Recent Labs  Lab 12/30/23 0817  INR 2.8*    Cardiac Enzymes: No results for input(s): "CKTOTAL", "CKMB", "CKMBINDEX", "TROPONINI" in the last 168 hours.  HbA1C: Hgb A1c MFr Bld  Date/Time Value Ref Range Status  11/12/2017 05:01 PM 12.0 (H) 4.8 - 5.6 % Final    Comment:    (NOTE) Pre diabetes:          5.7%-6.4% Diabetes:              >6.4% Glycemic control for   <7.0% adults with diabetes   06/27/2017 10:07 AM 13.1 (H) 4.8 - 5.6 % Final    Comment:    (NOTE) Pre diabetes:          5.7%-6.4% Diabetes:              >6.4% Glycemic control for   <7.0% adults with diabetes     CBG: No results for input(s): "GLUCAP" in the last 168 hours.  Review of  Systems:   Unable to obtain due to encephalopathy   Past Medical History:  She,  has a past medical history of Hypercholesterolemia, Hypertension, Kidney stones, and Type II diabetes mellitus (HCC).   Surgical History:   Past Surgical History:  Procedure Laterality Date   BREAST EXCISIONAL BIOPSY Left    CESAREAN SECTION  1999   DILATION AND CURETTAGE OF UTERUS     "I lost a baby"   IR PARACENTESIS  11/08/2023   IR PARACENTESIS  11/13/2023   IR PARACENTESIS  12/25/2023   IR RADIOLOGIST EVAL & MGMT  11/21/2017   IR RADIOLOGIST EVAL & MGMT  12/03/2017   TUBAL LIGATION  1999     Social History:   reports that she has never smoked. She has never used smokeless tobacco. She reports that she does not drink alcohol and does not use drugs.   Family History:  Her family history includes Hypertension in her father; Kidney failure in her father.   Allergies Allergies  Allergen Reactions   Ace Inhibitors Cough   Codeine Nausea And Vomiting and Other (See Comments)    GI upset   Statins Other (See Comments)    contraindicated due to liver disease     Home Medications  Prior to Admission medications   Medication Sig Start Date End Date Taking? Authorizing Provider  ALEVE 220 MG tablet Take 220-440 mg by mouth 2 (two) times daily as needed (for pain or headaches).    [provider]  amLODipine  (NORVASC ) 10 MG tablet Take 1 tablet (10 mg total) by mouth daily. 11/17/17   Colin Dawley, MD  carvedilol  (COREG ) 25 MG tablet Take 25 mg by mouth 2 (two) times daily with a meal.    [provider]  ezetimibe  (ZETIA ) 10 MG tablet Take 10 mg by mouth daily.    [provider]  furosemide (LASIX) 40 MG tablet Take 40 mg by mouth daily as needed for fluid or edema.    [provider]  insulin  glargine (LANTUS ) 100 UNIT/ML injection 30  Units sq Qhs and 12 units q am Patient not taking: Reported on 11/12/2023 11/17/17   Colin Dawley, MD  insulin  lispro (HUMALOG  KWIKPEN) 100 UNIT/ML KwikPen Inject 10 Units into the skin 3 (three) times daily with meals as needed (AS DIRECTED FOR AN ELEVATED BGL).    [provider]  levothyroxine  (SYNTHROID ) 300 MCG tablet Take 150-300 mcg by mouth See admin instructions. Take 150 mcg by mouth 30 minutes before breakfast on Sunday and Saturday and 300 mcg on Mon/Tues/Wed/Thurs/Fri    [provider]  losartan (COZAAR) 100 MG tablet Take 100 mg by mouth daily.    [provider]  mercaptopurine  (PURINETHOL ) 50 MG tablet Take 50 mg by mouth daily. Give on an empty stomach 1 hour before or 2 hours after meals. Caution: Chemotherapy. Patient not taking: Reported on 12/30/2023    [provider]  ondansetron  (ZOFRAN ) 4 MG tablet Take 4 mg by mouth every 6 (six) hours as needed for nausea or vomiting. Patient not taking: Reported on 12/30/2023    [provider]  ondansetron  (ZOFRAN ) 8 MG tablet Take 1 tablet (8 mg total) by mouth every 8 (eight) hours as needed for nausea or vomiting. 11/12/23   Pasam, Gale Jude, MD  predniSONE  (DELTASONE ) 10 MG tablet Take 2 tablets (20 mg) daily with food for 4 days, then 1 tablet (10 mg) daily with food until seen by your GI doctor Patient taking differently: Take 10 mg by mouth daily with breakfast. 11/17/17   Colin Dawley, MD  prochlorperazine  (COMPAZINE ) 10 MG tablet Take 1 tablet (10 mg total) by mouth every 6 (six) hours as needed for nausea or vomiting. 11/12/23   Pasam, Avinash, MD  triamcinolone cream (KENALOG) 0.1 % Apply 1 Application topically 2 (two) times daily as needed (for irritation, as directed- affected area).    [provider]  TRULICITY 0.75 MG/0.5ML SOAJ Inject 0.75 mg into the skin every Sunday.  [provider]  Vitamin D , Ergocalciferol , (DRISDOL ) 50000 units CAPS capsule Take 50,000 Units by mouth every Sunday.    [provider]  Vitamin K , Phytonadione , 100 MCG TABS Take 10 tablets (1,000 mcg total)  by mouth daily at 12 noon. 12/24/23   Arlo Berber, MD     Critical care time: 80 min       CRITICAL CARE Performed by: Delories Fetter    Critical care time was exclusive of separately billable procedures and treating other patients.  Critical care was necessary to treat or prevent imminent or life-threatening deterioration.  Critical care was time spent personally by me on the following activities: development of treatment plan with patient and/or surrogate as well as nursing, discussions with consultants, evaluation of patient's response to treatment, examination of patient, obtaining history from patient or surrogate, ordering and performing treatments and interventions, ordering and review of laboratory studies, ordering and review of radiographic studies, pulse oximetry and re-evaluation of patient's condition.  Eston Hence MSN, AGACNP-BC [facility] Amion for pager  12/30/2023, 4:41 PM

## 2023-12-30 NOTE — Progress Notes (Signed)
 Called report to [employer], Elisabeth Guild. Patient referred to ED due to uncontrolled pain of 9/10 to her very distended abdomen with current hx of ascites. Patient currently in route being driven by her daughter.

## 2023-12-30 NOTE — Assessment & Plan Note (Addendum)
 Recurrent ascites likely secondary to liver cirrhosis and also malignancy driven.  She had paracentesis on 11/08/2023 with removal of 1.8 L of ascitic fluid.  Cytology was negative for malignant cells.  She had another paracentesis on 12/25/2023 and 3 L of ascitic fluid was drained.    Recurrent ascites with abdominal distention and discomfort, rapidly reaccumulating post-paracentesis, likely due to liver cancer. Relief noted when supine. Concerns include potential hepatic circulation clot and need for imaging to rule out other pain causes. Persistent ascites may require PleurX catheter for home drainage, with infection risk necessitating careful management.  - We directed her to the ED for abdominal pain evaluation, paracentesis and further imaging.  -Given rapid reaccumulation of ascites, consider PleurX catheter placement for home drainage

## 2023-12-31 ENCOUNTER — Ambulatory Visit

## 2023-12-31 ENCOUNTER — Telehealth: Payer: Self-pay | Admitting: *Deleted

## 2023-12-31 ENCOUNTER — Ambulatory Visit: Admitting: Oncology

## 2023-12-31 ENCOUNTER — Other Ambulatory Visit

## 2023-12-31 ENCOUNTER — Ambulatory Visit: Admitting: Radiation Oncology

## 2023-12-31 DIAGNOSIS — A419 Sepsis, unspecified organism: Secondary | ICD-10-CM | POA: Diagnosis not present

## 2023-12-31 DIAGNOSIS — R579 Shock, unspecified: Secondary | ICD-10-CM | POA: Diagnosis not present

## 2023-12-31 DIAGNOSIS — E875 Hyperkalemia: Secondary | ICD-10-CM | POA: Diagnosis not present

## 2023-12-31 DIAGNOSIS — N179 Acute kidney failure, unspecified: Secondary | ICD-10-CM | POA: Diagnosis not present

## 2023-12-31 LAB — GLUCOSE, CAPILLARY
Glucose-Capillary: 105 mg/dL — ABNORMAL HIGH (ref 70–99)
Glucose-Capillary: 102 mg/dL — ABNORMAL HIGH (ref 70–99)
Glucose-Capillary: 121 mg/dL — ABNORMAL HIGH (ref 70–99)
Glucose-Capillary: 110 mg/dL — ABNORMAL HIGH (ref 70–99)

## 2023-12-31 LAB — BPAM RBC
Blood Product Expiration Date: 202505162359
ISSUE DATE / TIME: 202504211434
Unit Type and Rh: 6200

## 2023-12-31 LAB — TYPE AND SCREEN
ABO/RH(D): A POS
Antibody Screen: NEGATIVE
Unit division: 0

## 2023-12-31 LAB — COMPREHENSIVE METABOLIC PANEL WITH GFR
ALT: 155 U/L — ABNORMAL HIGH (ref 0–44)
AST: 367 U/L — ABNORMAL HIGH (ref 15–41)
Albumin: 1.9 g/dL — ABNORMAL LOW (ref 3.5–5.0)
Alkaline Phosphatase: 81 U/L (ref 38–126)
Anion gap: 9 (ref 5–15)
BUN: 58 mg/dL — ABNORMAL HIGH (ref 8–23)
CO2: 12 mmol/L — ABNORMAL LOW (ref 22–32)
Calcium: 8.3 mg/dL — ABNORMAL LOW (ref 8.9–10.3)
Chloride: 106 mmol/L (ref 98–111)
Creatinine, Ser: 5.1 mg/dL — ABNORMAL HIGH (ref 0.44–1.00)
GFR, Estimated: 9 mL/min — ABNORMAL LOW (ref >60)
Glucose, Bld: 108 mg/dL — ABNORMAL HIGH (ref 70–99)
Potassium: 6 mmol/L — ABNORMAL HIGH (ref 3.5–5.1)
Sodium: 127 mmol/L — ABNORMAL LOW (ref 135–145)
Total Bilirubin: 5.2 mg/dL — ABNORMAL HIGH (ref 0.0–1.2)
Total Protein: 9.2 g/dL — ABNORMAL HIGH (ref 6.5–8.1)

## 2023-12-31 LAB — PREPARE FRESH FROZEN PLASMA: Unit division: 0

## 2023-12-31 LAB — BPAM FFP
Blood Product Expiration Date: 202504262359
ISSUE DATE / TIME: 202504211808
Unit Type and Rh: 6200

## 2023-12-31 LAB — LACTIC ACID, PLASMA: Lactic Acid, Venous: 6.5 mmol/L (ref 0.5–1.9)

## 2023-12-31 LAB — RASBURICASE - URIC ACID: Uric Acid, Serum: 7.4 mg/dL — ABNORMAL HIGH (ref 2.5–7.1)

## 2023-12-31 LAB — CBC
HCT: 25.5 % — ABNORMAL LOW (ref 36.0–46.0)
Hemoglobin: 8.3 g/dL — ABNORMAL LOW (ref 12.0–15.0)
MCH: 32.9 pg (ref 26.0–34.0)
MCHC: 32.5 g/dL (ref 30.0–36.0)
MCV: 101.2 fL — ABNORMAL HIGH (ref 80.0–100.0)
Platelets: 80 10*3/uL — ABNORMAL LOW (ref 150–400)
RBC: 2.52 MIL/uL — ABNORMAL LOW (ref 3.87–5.11)
RDW: 17.5 % — ABNORMAL HIGH (ref 11.5–15.5)
WBC: 12 10*3/uL — ABNORMAL HIGH (ref 4.0–10.5)
nRBC: 0 % (ref 0.0–0.2)

## 2023-12-31 LAB — POTASSIUM
Potassium: 6.1 mmol/L — ABNORMAL HIGH (ref 3.5–5.1)
Potassium: 6.2 mmol/L — ABNORMAL HIGH (ref 3.5–5.1)

## 2023-12-31 MED ORDER — CHLORHEXIDINE GLUCONATE CLOTH 2 % EX PADS
6.0000 | MEDICATED_PAD | Freq: Every day | CUTANEOUS | Status: DC
  Administered 2023-12-31 – 2024-01-10 (×11): 6 via TOPICAL

## 2023-12-31 MED ORDER — SODIUM BICARBONATE 8.4 % IV SOLN
50.0000 meq | Freq: Once | INTRAVENOUS | Status: AC
  Administered 2023-12-31: 50 meq via INTRAVENOUS
  Filled 2023-12-31: qty 50

## 2023-12-31 MED ORDER — CALCIUM GLUCONATE-NACL 1-0.675 GM/50ML-% IV SOLN
1.0000 g | Freq: Once | INTRAVENOUS | Status: AC
  Administered 2023-12-31: 1000 mg via INTRAVENOUS
  Filled 2023-12-31: qty 50

## 2023-12-31 MED ORDER — INSULIN ASPART 100 UNIT/ML IV SOLN
5.0000 [IU] | Freq: Once | INTRAVENOUS | Status: AC
  Administered 2023-12-31: 5 [IU] via INTRAVENOUS

## 2023-12-31 MED ORDER — LACTULOSE 10 GM/15ML PO SOLN: 10.0000 g | Freq: Three times a day (TID) | ORAL | Status: DC

## 2023-12-31 MED ORDER — SODIUM ZIRCONIUM CYCLOSILICATE 10 G PO PACK: 10.0000 g | PACK | Freq: Three times a day (TID) | ORAL | Status: DC

## 2023-12-31 MED ORDER — DEXTROSE 50 % IV SOLN
1.0000 | Freq: Once | INTRAVENOUS | Status: AC
Start: 2023-12-31 — End: 2023-12-31
  Administered 2023-12-31: 50 mL via INTRAVENOUS
  Filled 2023-12-31: qty 50

## 2023-12-31 MED ORDER — ALBUMIN HUMAN 5 % IV SOLN
25.0000 g | Freq: Once | INTRAVENOUS | Status: AC
  Administered 2023-12-31: 25 g via INTRAVENOUS
  Filled 2023-12-31: qty 500

## 2023-12-31 NOTE — TOC Initial Note (Signed)
 Transition of Care [facility]) - Initial/Assessment Note    Patient Details  Name: Dana Thornton MRN: [id number] Date of Birth: 1956/04/08  [facility]) CM/SW Contact:    Amaryllis Junior, LCSW Phone Number: 12/31/2023, 11:17 AM  Clinical Narrative:                 Pt from home with spouse. Pt continues medical workup. TOC following for dc needs.     Barriers to Discharge: Continued Medical Work up   Patient Goals and CMS Choice Patient states their goals for this hospitalization and ongoing recovery are:: return home CMS Medicare.gov Compare Post Acute Care list provided to::  (NA) Choice offered to / list presented to : NA [place] ownership interest in [facility].provided to::  (NA)    Expected Discharge Plan and Services In-house Referral: NA     Living arrangements for the past 2 months: Single Family Home                 DME Arranged: N/A DME Agency: NA       HH Arranged: NA HH Agency: NA        Prior Living Arrangements/Services Living arrangements for the past 2 months: Single Family Home Lives with:: Spouse Patient language and need for interpreter reviewed:: Yes Do you feel safe going back to the place where you live?: Yes      Need for Family Participation in Patient Care: Yes (Comment) Care giver support system in place?: Yes (comment)   Criminal Activity/Legal Involvement Pertinent to Current Situation/Hospitalization: No - Comment as needed  Activities of Daily Living   ADL Screening (condition at time of admission) Independently performs ADLs?: Yes (appropriate for developmental age) Is the patient deaf or have difficulty hearing?: No Does the patient have difficulty seeing, even when wearing glasses/contacts?: No Does the patient have difficulty concentrating, remembering, or making decisions?: No  Permission Sought/Granted                  Emotional Assessment Appearance:: Appears stated age       Alcohol /  Substance Use: Not Applicable Psych Involvement: No (comment)  Admission diagnosis:  Hyperkalemia [E87.5] Diverticulitis [K57.92] Shock (HCC) [R57.9] Symptomatic anemia [D64.9] Acute renal failure superimposed on chronic kidney disease, unspecified acute renal failure type, unspecified CKD stage (HCC) [N17.9, N18.9] Patient Active Problem List   Diagnosis Date Noted   Coagulopathy (HCC) 12/30/2023   Shock (HCC) 12/30/2023   Abnormal uterine bleeding 12/17/2023   Elevated total protein 12/17/2023   Diverticulitis 11/12/2023   Hepatocellular carcinoma (HCC) 10/29/2023   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) 10/29/2023   Ascites 10/29/2023   Perforation of sigmoid colon due to diverticulitis 10/10/2023   Hepatic cirrhosis (HCC) 10/10/2023   Cholelithiasis 10/10/2023   Hyponatremia 10/10/2023   Normocytic anemia 10/10/2023   Hypothyroidism 10/10/2023   Hyperlipidemia associated with type 2 diabetes mellitus (HCC) 10/10/2023   Hyperkalemia    Hyperglycemia 04/09/2015   Transaminitis 04/09/2015   Protein-calorie malnutrition, severe (HCC) 03/30/2015   Uncontrolled diabetes mellitus type 2 without complications (HCC) 03/29/2015   Insulin  dependent type 2 diabetes mellitus (HCC) 12/07/2011   Hypertension associated with diabetes (HCC) 12/07/2011   Autoimmune hepatitis (HCC) 12/03/2011   PCP:  Elester Grim, MD Pharmacy:   [facility] [phone] - [place], [place] - [address] AT [address] [place] [address] Phone: [phone] Fax: [phone]     Social Drivers of Health (  SDOH) Social History: SDOH Screenings   Food Insecurity: No Food Insecurity (12/30/2023)  Recent Concern: Food Insecurity - Food Insecurity Present (11/13/2023)  Housing: Low Risk  (12/30/2023)  Transportation Needs: No Transportation Needs (12/30/2023)  Utilities: Not At Risk (12/30/2023)  Depression (PHQ2-9): Low Risk  (11/27/2023)  Social Connections:  Socially Integrated (10/10/2023)  Tobacco Use: Low Risk  (12/30/2023)   SDOH Interventions:     Readmission Risk Interventions    12/31/2023   11:15 AM 10/14/2023    9:31 AM  Readmission Risk Prevention Plan  Post Dischage Appt  Complete  Medication Screening  Complete  Transportation Screening Complete Complete  Medication Review (RN Care Manager) Complete   PCP or Specialist appointment within 3-5 days of discharge Complete   HRI or Home Care Consult Complete   SW Recovery Care/Counseling Consult Complete   Palliative Care Screening Not Applicable   Skilled Nursing Facility Not Applicable

## 2023-12-31 NOTE — Progress Notes (Signed)
 I met with Dana Thornton's family (sister, brother-in-law and niece) in the waiting area. They shared that Dana Thornton's son is coming in from out of state and that the family is not planning to make any decisions until that time.  They were appreciative of the support and asked for someone to come check this afternoon.One of the chaplains will plan to follow up, but please also page as needs arise.

## 2023-12-31 NOTE — Telephone Encounter (Signed)
 Error

## 2023-12-31 NOTE — Progress Notes (Signed)
 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 Noted to have recurrence of ascites despite 3L para 4/16. She was started on abx, given IVF and ultimately started on 2mcg NE.  Add'l labs w  LA 4.2, Albumin  <1.5 and repeat CBC w plt drop to 80 from 104 a few hours earlier repeat hgb 7.3 from 7.8  PCCM called for admission in this setting    Pertinent  Medical History  Metastatic hepatocellular carcinoma AI hepatitis Hepatic cirrhosis w ascites Abnormal uterine bleeding   Significant Hospital Events: Including procedures, antibiotic start and stop dates in addition to other pertinent events   4/21 admitted. Broad abx IVF pressors TLS labs. 1 PRBC 1 FFP. Zosyn .  Rasburicase    4/22 worse MSOF. Rec consideration of comfort focussed care-- supportive care while family discusses   Interim History / Subjective:  Overnight labs correct a bit initially but now worse  LA 6.5 K 6 Cr 5.1 Tbili 5 Uric acid is down to 7.4 after rasburicase   Ammonia 83 LDH 218 Objective   Blood pressure 136/77, pulse 96, temperature (!) 97.4 F (36.3 C), temperature source Axillary, resp. rate (!) 8, height 5\' 4"  (1.626 m), weight 99.9 kg, SpO2 100%.        Intake/Output Summary (Last 24 hours) at 12/31/2023 4098 Last data filed at 12/31/2023 1191 Gross per 24 hour  Intake  4572.79 ml  Output --  Net 4572.79 ml   Filed Weights   12/30/23 1059 12/30/23 1836 12/31/23 0506  Weight: 85.3 kg 100.4 kg 99.9 kg    Examination: General: ill appearing F  HENT: NCAT  Lungs: incr RR  Cardiovascular: rr  Abdomen: round, generalized tenderness  Extremities: on acute joint deformity  Neuro: Lethargic, intermittently following simple commands  GU: foley with 25 cc pink urine   Resolved Hospital Problem list     Assessment & Plan:   GOC DNR discussion  Acute encephalopathy  Hepatic encephalopathy  Advanced stage hepatocellular carcinoma, mets to R adrenal gland (on immunotherapy) Autoimmune hepatitis Hepatic cirrhosis w recurrent ascites Portal hypertension Coagulopathy Anemia Thrombocytopenia Hyperbilirubinemia Hyperammonemia  Lactic acidosis  Hypotension AKI on CKD 3b, suspect ATN  Metabolic acidosis  Hyperkalemia Hyperphosphatemia Elevated uric acid  Elevated LDH Possible TLS  Hyponatremia  Descending and sigmoid diverticulosis, possible acute diverticulitis Possible small peridiverticular abscess vs fluid filled diverticulum  Cholecystolithiasis  DM Hypothyroidism  -worsening multisystem organ dysfunction & sequelae of.  -significant renal failure, would not be a dialysis candidate -discussed GOC with the family. Have recommended continuing supportive measures to facilitate son coming into town, then shifting to comfort care.  -Family has requested time to talk this morning, we will revisit in a bit.   Supportive care otherwise: -If she can take POs -- can do lactulose , Vit K in interim but in context of nearing  EOL these can be deferred if mental status precludes (will not place an NGT to give these meds)  -cont supportive care w albumin , NE -Temporizing measures for recurrent hyperK -foley inserted 4/22 - continue  -zosyn . MRSA neg, dc zyvox   -defer CCS consult while family GOC are est  -SSI -defer synthroid     Best Practice  (right click and "Reselect all SmartList Selections" daily)   Diet/type: NPO DVT prophylaxis SCD Pressure ulcer(s): N/A GI prophylaxis: N/A Lines: N/A Foley:  Yes, and it is still needed Code Status:  full code Last date of multidisciplinary goals of care discussion [4/22]  Labs   CBC: Recent Labs  Lab 12/30/23 0817 12/30/23 1049 12/30/23 1921 12/31/23 0433  WBC 8.9 9.7 10.7* 12.0*  NEUTROABS 5.0 6.0  --   --   HGB 7.8* 7.3* 7.7* 8.3*  HCT 21.5* 20.8* 23.6* 25.5*  MCV 90.3 93.7 98.3 101.2*  PLT 104* 80* 81* 80*    Basic Metabolic Panel: Recent Labs  Lab 12/30/23 0817 12/30/23 1049 12/30/23 1703 12/30/23 2317 12/31/23 0433  NA 127* 128*  --   --  127*  K 6.3* 6.5* 4.5 5.6* 6.0*  CL 106 107  --   --  106  CO2 16* 14*  --   --  12*  GLUCOSE 91 97  --   --  108*  BUN 56* 57*  --   --  58*  CREATININE 4.73* 4.82*  --   --  5.10*  CALCIUM  8.5* 8.0*  --   --  8.3*  MG  --  2.1  --   --   --   PHOS  --   --  5.0*  --   --    GFR: Estimated Creatinine Clearance: 12.3 mL/min (A) (by C-G formula based on SCr of 5.1 mg/dL (H)). Recent Labs  Lab 12/30/23 0817 12/30/23 1049 12/30/23 1248 12/30/23 1703 12/30/23 1921 12/31/23 0433  WBC 8.9 9.7  --   --  10.7* 12.0*  LATICACIDVEN  --   --  4.2* 6.3* 7.5* 6.5*    Liver Function Tests: Recent Labs  Lab 12/30/23 0817 12/30/23 1049 12/31/23 0433  AST 294* 307* 367*  ALT 124* 124* 155*  ALKPHOS 101 89 81  BILITOT 3.6* 3.3* 5.2*  PROT 9.5* 8.7* 9.2*  ALBUMIN  <1.5* <1.5* 1.9*   Recent Labs  Lab 12/30/23 1049  LIPASE 49   Recent Labs  Lab 12/30/23 1703  AMMONIA 83*    ABG    Component Value Date/Time   PHART 7.401 06/30/2017 1055   PCO2ART 33.4 06/30/2017 1055   PO2ART 58.3 (L) 06/30/2017 1055   HCO3 20.3 06/30/2017 1055   TCO2 18 (L) 06/27/2017 0510   ACIDBASEDEF 3.7 (H) 06/30/2017 1055   O2SAT 89.3 06/30/2017 1055     Coagulation Profile: Recent Labs  Lab 12/30/23 0817 12/30/23 1921  INR  2.8* 2.7*    Cardiac Enzymes: No results for input(s): "CKTOTAL", "CKMB", "CKMBINDEX", "TROPONINI" in the last 168 hours.  HbA1C: Hgb A1c MFr Bld  Date/Time Value Ref Range Status  12/30/2023 05:03 PM 4.7 (L) 4.8 - 5.6 % Final    Comment:    (NOTE) Pre diabetes:          5.7%-6.4%  Diabetes:              >6.4%  Glycemic control for   <7.0% adults with diabetes   11/12/2017 05:01 PM 12.0 (H) 4.8 - 5.6 % Final    Comment:    (  NOTE) Pre diabetes:          5.7%-6.4% Diabetes:              >6.4% Glycemic control for   <7.0% adults with diabetes     CBG: Recent Labs  Lab 12/30/23 1728 12/30/23 2013 12/30/23 2316 12/31/23 0337 12/31/23 0735  GLUCAP 137* 114* 107* 105* 102*   CRITICAL CARE Performed by: Delories Fetter   Total critical care time: 38  minutes  Critical care time was exclusive of separately billable procedures and treating other patients. Critical care was necessary to treat or prevent imminent or life-threatening deterioration.  Critical care was time spent personally by me on the following activities: development of treatment plan with patient and/or surrogate as well as nursing, discussions with consultants, evaluation of patient's response to treatment, examination of patient, obtaining history from patient or surrogate, ordering and performing treatments and interventions, ordering and review of laboratory studies, ordering and review of radiographic studies, pulse oximetry and re-evaluation of patient's condition.  Eston Hence MSN, AGACNP-BC  Pulmonary/Critical Care Medicine Amion for pager  12/31/2023, 8:52 AM

## 2023-12-31 NOTE — Progress Notes (Signed)
   12/31/23 1418  Spiritual Encounters  Type of Visit Follow up  Care provided to: Pt and family  Reason for visit Routine spiritual support  OnCall Visit No   Visited with patient and family, provided prayer. Other family members expected.

## 2023-12-31 NOTE — Plan of Care (Signed)
  Problem: Coping: Goal: Ability to adjust to condition or change in health will improve Outcome: Progressing   Problem: Skin Integrity: Goal: Risk for impaired skin integrity will decrease Outcome: Progressing   Problem: Tissue Perfusion: Goal: Adequacy of tissue perfusion will improve Outcome: Progressing   Problem: Clinical Measurements: Goal: Diagnostic test results will improve Outcome: Progressing Goal: Respiratory complications will improve Outcome: Progressing Goal: Cardiovascular complication will be avoided Outcome: Progressing   Problem: Activity: Goal: Risk for activity intolerance will decrease Outcome: Progressing   Problem: Coping: Goal: Level of anxiety will decrease Outcome: Progressing   Problem: Pain Managment: Goal: General experience of comfort will improve and/or be controlled Outcome: Progressing   Problem: Safety: Goal: Ability to remain free from injury will improve Outcome: Progressing   Problem: Skin Integrity: Goal: Risk for impaired skin integrity will decrease Outcome: Progressing

## 2023-12-31 NOTE — Progress Notes (Signed)
   12/31/23 1601  Spiritual Encounters  Type of Visit Follow up  Care provided to: Pt and family  Conversation partners present during encounter Nurse  Referral source Family  Reason for visit End-of-life  OnCall Visit No   Requested by family to return to patient's room as son had arrived from [place]. Provided continued spiritual care as other family members continued to arrive.

## 2023-12-31 NOTE — Plan of Care (Signed)
 Unit of plasma complete, family at bedside, plan of care and goals reviewed, time given for questions and answers, patient remains levophed  at this time, patient temp 96.7, warm blankets applied.  Problem: Education: Goal: Ability to describe self-care measures that may prevent or decrease complications (Diabetes Survival Skills Education) will improve Outcome: Progressing Goal: Individualized Educational Video(s) Outcome: Progressing   Problem: Coping: Goal: Ability to adjust to condition or change in health will improve Outcome: Progressing   Problem: Fluid Volume: Goal: Ability to maintain a balanced intake and output will improve Outcome: Progressing   Problem: Health Behavior/Discharge Planning: Goal: Ability to identify and utilize available resources and services will improve Outcome: Progressing Goal: Ability to manage health-related needs will improve Outcome: Progressing   Problem: Metabolic: Goal: Ability to maintain appropriate glucose levels will improve Outcome: Progressing   Problem: Nutritional: Goal: Maintenance of adequate nutrition will improve Outcome: Progressing Goal: Progress toward achieving an optimal weight will improve Outcome: Progressing   Problem: Skin Integrity: Goal: Risk for impaired skin integrity will decrease Outcome: Progressing   Problem: Tissue Perfusion: Goal: Adequacy of tissue perfusion will improve Outcome: Progressing   Problem: Education: Goal: Knowledge of General Education information will improve Description: Including pain rating scale, medication(s)/side effects and non-pharmacologic comfort measures Outcome: Progressing   Problem: Health Behavior/Discharge Planning: Goal: Ability to manage health-related needs will improve Outcome: Progressing   Problem: Clinical Measurements: Goal: Ability to maintain clinical measurements within normal limits will improve Outcome: Progressing Goal: Will remain free from  infection Outcome: Progressing Goal: Diagnostic test results will improve Outcome: Progressing Goal: Respiratory complications will improve Outcome: Progressing Goal: Cardiovascular complication will be avoided Outcome: Progressing   Problem: Activity: Goal: Risk for activity intolerance will decrease Outcome: Progressing   Problem: Nutrition: Goal: Adequate nutrition will be maintained Outcome: Progressing   Problem: Coping: Goal: Level of anxiety will decrease Outcome: Progressing   Problem: Elimination: Goal: Will not experience complications related to bowel motility Outcome: Progressing Goal: Will not experience complications related to urinary retention Outcome: Progressing   Problem: Pain Managment: Goal: General experience of comfort will improve and/or be controlled Outcome: Progressing   Problem: Safety: Goal: Ability to remain free from injury will improve Outcome: Progressing   Problem: Skin Integrity: Goal: Risk for impaired skin integrity will decrease Outcome: Progressing

## 2024-01-01 ENCOUNTER — Ambulatory Visit

## 2024-01-01 ENCOUNTER — Ambulatory Visit: Admitting: Nurse Practitioner

## 2024-01-01 ENCOUNTER — Other Ambulatory Visit

## 2024-01-01 ENCOUNTER — Inpatient Hospital Stay (HOSPITAL_COMMUNITY)

## 2024-01-01 DIAGNOSIS — R579 Shock, unspecified: Secondary | ICD-10-CM | POA: Diagnosis not present

## 2024-01-01 DIAGNOSIS — K7689 Other specified diseases of liver: Secondary | ICD-10-CM

## 2024-01-01 DIAGNOSIS — A419 Sepsis, unspecified organism: Secondary | ICD-10-CM | POA: Diagnosis not present

## 2024-01-01 DIAGNOSIS — D689 Coagulation defect, unspecified: Secondary | ICD-10-CM | POA: Diagnosis not present

## 2024-01-01 DIAGNOSIS — K5792 Diverticulitis of intestine, part unspecified, without perforation or abscess without bleeding: Secondary | ICD-10-CM

## 2024-01-01 DIAGNOSIS — N189 Chronic kidney disease, unspecified: Secondary | ICD-10-CM

## 2024-01-01 DIAGNOSIS — N179 Acute kidney failure, unspecified: Secondary | ICD-10-CM | POA: Diagnosis not present

## 2024-01-01 DIAGNOSIS — E875 Hyperkalemia: Secondary | ICD-10-CM | POA: Diagnosis not present

## 2024-01-01 LAB — POTASSIUM
Potassium: 5.9 mmol/L — ABNORMAL HIGH (ref 3.5–5.1)
Potassium: 4.9 mmol/L (ref 3.5–5.1)
Potassium: 5.1 mmol/L (ref 3.5–5.1)
Potassium: 5 mmol/L (ref 3.5–5.1)
Potassium: 4.8 mmol/L (ref 3.5–5.1)

## 2024-01-01 LAB — CBC
HCT: 22.8 % — ABNORMAL LOW (ref 36.0–46.0)
Hemoglobin: 7.9 g/dL — ABNORMAL LOW (ref 12.0–15.0)
MCH: 33.2 pg (ref 26.0–34.0)
MCHC: 34.6 g/dL (ref 30.0–36.0)
MCV: 95.8 fL (ref 80.0–100.0)
Platelets: 70 10*3/uL — ABNORMAL LOW (ref 150–400)
RBC: 2.38 MIL/uL — ABNORMAL LOW (ref 3.87–5.11)
RDW: 16.8 % — ABNORMAL HIGH (ref 11.5–15.5)
WBC: 8.6 10*3/uL (ref 4.0–10.5)
nRBC: 0 % (ref 0.0–0.2)

## 2024-01-01 LAB — BASIC METABOLIC PANEL WITH GFR
Anion gap: 7 (ref 5–15)
BUN: 65 mg/dL — ABNORMAL HIGH (ref 8–23)
CO2: 17 mmol/L — ABNORMAL LOW (ref 22–32)
Calcium: 8 mg/dL — ABNORMAL LOW (ref 8.9–10.3)
Chloride: 107 mmol/L (ref 98–111)
Creatinine, Ser: 6.4 mg/dL — ABNORMAL HIGH (ref 0.44–1.00)
GFR, Estimated: 7 mL/min — ABNORMAL LOW (ref >60)
Glucose, Bld: 96 mg/dL (ref 70–99)
Potassium: 5.7 mmol/L — ABNORMAL HIGH (ref 3.5–5.1)
Sodium: 131 mmol/L — ABNORMAL LOW (ref 135–145)

## 2024-01-01 LAB — COMPREHENSIVE METABOLIC PANEL WITH GFR
ALT: 252 U/L — ABNORMAL HIGH (ref 0–44)
AST: 490 U/L — ABNORMAL HIGH (ref 15–41)
Albumin: 1.8 g/dL — ABNORMAL LOW (ref 3.5–5.0)
Alkaline Phosphatase: 72 U/L (ref 38–126)
Anion gap: 13 (ref 5–15)
BUN: 64 mg/dL — ABNORMAL HIGH (ref 8–23)
CO2: 14 mmol/L — ABNORMAL LOW (ref 22–32)
Calcium: 8.2 mg/dL — ABNORMAL LOW (ref 8.9–10.3)
Chloride: 106 mmol/L (ref 98–111)
Creatinine, Ser: 6.22 mg/dL — ABNORMAL HIGH (ref 0.44–1.00)
GFR, Estimated: 7 mL/min — ABNORMAL LOW (ref >60)
Glucose, Bld: 88 mg/dL (ref 70–99)
Potassium: 4.7 mmol/L (ref 3.5–5.1)
Sodium: 133 mmol/L — ABNORMAL LOW (ref 135–145)
Total Bilirubin: 5.1 mg/dL — ABNORMAL HIGH (ref 0.0–1.2)
Total Protein: 8 g/dL (ref 6.5–8.1)

## 2024-01-01 LAB — GLUCOSE, CAPILLARY
Glucose-Capillary: 115 mg/dL — ABNORMAL HIGH (ref 70–99)
Glucose-Capillary: 83 mg/dL (ref 70–99)
Glucose-Capillary: 89 mg/dL (ref 70–99)
Glucose-Capillary: 97 mg/dL (ref 70–99)
Glucose-Capillary: 98 mg/dL (ref 70–99)
Glucose-Capillary: 98 mg/dL (ref 70–99)

## 2024-01-01 LAB — MAGNESIUM: Magnesium: 2.2 mg/dL (ref 1.7–2.4)

## 2024-01-01 LAB — AMMONIA: Ammonia: 55 umol/L — ABNORMAL HIGH (ref 9–35)

## 2024-01-01 LAB — PHOSPHORUS: Phosphorus: 6.7 mg/dL — ABNORMAL HIGH (ref 2.5–4.6)

## 2024-01-01 LAB — APTT: aPTT: 55 s — ABNORMAL HIGH (ref 24–36)

## 2024-01-01 MED ORDER — PROSOURCE TF20 ENFIT COMPATIBL EN LIQD
60.0000 mL | Freq: Every day | ENTERAL | Status: DC
  Administered 2024-01-02 – 2024-01-11 (×10): 60 mL
  Filled 2024-01-01 (×11): qty 60

## 2024-01-01 MED ORDER — ALBUMIN HUMAN 25 % IV SOLN
12.5000 g | Freq: Once | INTRAVENOUS | Status: AC
  Administered 2024-01-01: 12.5 g via INTRAVENOUS
  Filled 2024-01-01: qty 50

## 2024-01-01 MED ORDER — SODIUM POLYSTYRENE SULFONATE 15 GM/60ML CO SUSP: 30.0000 g | Freq: Once | Status: DC

## 2024-01-01 MED ORDER — SODIUM ZIRCONIUM CYCLOSILICATE 10 G PO PACK
10.0000 g | PACK | Freq: Three times a day (TID) | ORAL | Status: DC
  Administered 2024-01-01 – 2024-01-02 (×5): 10 g
  Filled 2024-01-01 (×5): qty 1

## 2024-01-01 MED ORDER — VITAL 1.5 CAL PO LIQD
1000.0000 mL | ORAL | Status: DC
  Administered 2024-01-01 – 2024-01-11 (×7): 1000 mL
  Filled 2024-01-01 (×16): qty 1000

## 2024-01-01 MED ORDER — DOCUSATE SODIUM 50 MG/5ML PO LIQD: 100.0000 mg | Freq: Two times a day (BID) | ORAL | Status: DC | PRN

## 2024-01-01 MED ORDER — PHYTONADIONE 5 MG PO TABS
2.5000 mg | ORAL_TABLET | Freq: Every day | ORAL | Status: DC
  Administered 2024-01-01: 2.5 mg
  Filled 2024-01-01: qty 1

## 2024-01-01 MED ORDER — LEVOTHYROXINE SODIUM 75 MCG PO TABS
150.0000 ug | ORAL_TABLET | ORAL | Status: DC
  Administered 2024-01-04 – 2024-01-11 (×3): 150 ug
  Filled 2024-01-01 (×3): qty 2

## 2024-01-01 MED ORDER — LACTULOSE 10 GM/15ML PO SOLN
10.0000 g | Freq: Three times a day (TID) | ORAL | Status: DC
  Administered 2024-01-01 – 2024-01-02 (×6): 10 g
  Filled 2024-01-01 (×6): qty 15

## 2024-01-01 MED ORDER — LEVOTHYROXINE SODIUM 100 MCG PO TABS
300.0000 ug | ORAL_TABLET | ORAL | Status: DC
  Administered 2024-01-02 – 2024-01-10 (×7): 300 ug
  Filled 2024-01-01 (×7): qty 3

## 2024-01-01 MED ORDER — SODIUM ZIRCONIUM CYCLOSILICATE 10 G PO PACK: 10.0000 g | PACK | Freq: Once | ORAL | Status: DC

## 2024-01-01 MED ORDER — POLYETHYLENE GLYCOL 3350 17 G PO PACK: 17.0000 g | PACK | Freq: Every day | ORAL | Status: DC | PRN

## 2024-01-01 MED ORDER — ALBUTEROL SULFATE (2.5 MG/3ML) 0.083% IN NEBU
10.0000 mg | INHALATION_SOLUTION | Freq: Once | RESPIRATORY_TRACT | Status: AC
  Administered 2024-01-01: 10 mg via RESPIRATORY_TRACT
  Filled 2024-01-01: qty 12

## 2024-01-01 MED ORDER — SODIUM BICARBONATE 8.4 % IV SOLN
50.0000 meq | Freq: Once | INTRAVENOUS | Status: AC
  Administered 2024-01-01: 50 meq via INTRAVENOUS
  Filled 2024-01-01: qty 50

## 2024-01-01 NOTE — Progress Notes (Addendum)
 Initial Nutrition Assessment  DOCUMENTATION CODES:   Obesity unspecified  INTERVENTION:  - Initiate tube feeding below via Cortrak (tip in stomach) Vital 1.5 at 60 ml/h (1440 ml per day) *Start at 11mL/hr and advance by 10mL Q12H Prosource TF20 60 ml daily Provides 2240 kcal, 117 gm protein, 1100 ml free water  daily  - Monitor magnesium , potassium, and phosphorus BID for at least 3 days, MD to replete as needed, as pt is at risk for refeeding syndrome given likely inadequate oral intake for ~1 month.   - FWF per CCM/MD.  - Monitor weights.   NUTRITION DIAGNOSIS:   Inadequate oral intake related to inability to eat as evidenced by NPO status.  GOAL:   Patient will meet greater than or equal to 90% of their needs  MONITOR:   Diet advancement, Labs, Weight trends, TF tolerance  REASON FOR ASSESSMENT:   Consult Enteral/tube feeding initiation and management  ASSESSMENT:   68 y.o. 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 Admitted for hepatic encephalopathy and septic shock.   4/21 Admit; NPO 4/23 Cortrak placed (tip in stomach)  Patient in bed at time of visit. Husband and sister at bedside.   They report a UBW of 208# with fluid, without fluid they note patient is closer to 190#. Both share that patient has been up and down in her weight recently due to fluid related changes. No major changes in actual body weight over the past few months.  Over the past 2-4 weeks, they endorse patient had been eating less with smaller meals. Does drink [employer] protein supplements at home as had continued to do so even when eating less.   Patient has been NPO since admit. Cortrak placed today with plan to start tube feeds. Discussed with husband and sister. Addressed all  questions and concerns.   Per CCM, can initiate tube feed and begin advancing towards goal today. Patient is at risk of refeeding syndrome due to likely inadequate oral intake for 2-4 weeks and NPO since admission. Discussed plan to advance slowly with CCM and RN.  Will plan to start with Vital but will need to monitor renal function for need to adjust formula/rate.  Of note, patient was made DNR/DNI today. Patient's oncologist and CCM has recommended comfort care. Plan to monitor clinical course over the next 1-2 days.    Medications reviewed and include: Lactulose  TID, 2.5mg  vitamin K  daily, Lokelma  TID  Labs reviewed:  Na 133 Creatinine 6.22 HA1C 4.7 Blood Glucose 83-121 x24 hours   NUTRITION - FOCUSED PHYSICAL EXAM:  Flowsheet Row Most Recent Value  Orbital Region No depletion  Upper Arm Region No depletion  Thoracic and Lumbar Region No depletion  Buccal Region No depletion  Temple Region No depletion  Clavicle Bone Region No depletion  Clavicle and Acromion Bone Region No depletion  Scapular Bone Region Unable to assess  Dorsal Hand No depletion  Patellar Region No depletion  Anterior Thigh Region No depletion  Posterior Calf Region No depletion  Edema (RD Assessment) Mild  Hair Reviewed  Eyes Unable to assess  Mouth Unable to assess  Skin Reviewed  Nails Reviewed       Diet Order:   Diet Order             Diet NPO time specified Except for:  Sips with Meds  Diet effective now                   EDUCATION NEEDS:  Education needs have been addressed  Skin:  Skin Assessment: Reviewed RN Assessment  Last BM:  PTA  Height:  Ht Readings from Last 1 Encounters:  12/30/23 5\' 4"  (1.626 m)   Weight:  Wt Readings from Last 1 Encounters:  01/01/24 103.9 kg   Ideal Body Weight:  54.55 kg  BMI:  Body mass index is 39.32 kg/m.  Estimated Nutritional Needs:  Kcal:  2150-2300 kcals Protein:  100-125 grams Fluid:  >/= 2.1L (or per MD)    Scheryl Cushing RD, LDN Contact via Secure Chat.

## 2024-01-01 NOTE — Procedures (Signed)
 CORTRAK TUBE INSERTION   A 10 F Cortrak tube has been placed. The tube was secured at the right nare at 60 cm by a member of the Cortrak tube team. The tube tip is Stomach  X-ray is required, abdominal x-ray has been ordered. Please confirm tube placement before using the Cortrak tube.   If the tube becomes dislodged please keep the tube and contact the Rapid Response team   Lacretia Piccolo, RN 01/01/2024 9:37 AM Procedures

## 2024-01-01 NOTE — Plan of Care (Signed)

## 2024-01-01 NOTE — Progress Notes (Signed)
   01/01/24 1600  Spiritual Encounters  Type of Visit Follow up  Care provided to: Pt and family  Referral source Family  Reason for visit Routine spiritual support  OnCall Visit No   Follow up visit with patient and family. Provided spiritual care and prayer to all present. Patient was able to follow me with her eyes and make facial expression. Son reports patient has gotten stronger. Family at bedside. Invited to visit again tomorrow.

## 2024-01-01 NOTE — Progress Notes (Signed)
 eLink Physician-Brief Progress Note Patient Name: Dana Thornton DOB: 1956/05/09 MRN: [id number]   Date of Service  01/01/2024  HPI/Events of Note  [name]  eICU Interventions  Modified hyperkalemia treatment orders entered.        [name] 01/01/2024, 6:17 AM

## 2024-01-01 NOTE — IPAL (Signed)
  Interdisciplinary Goals of Care Family Meeting   Date carried out: 01/01/2024  Location of the meeting: Bedside  Member's involved: Physician, Bedside Registered Nurse, and Family Member or next of kin  Durable Power of Attorney or acting medical decision maker: mulitple family members present    Discussion: We discussed goals of care for Dana Thornton .  Hepatocellular carcinoma, cirrhosis.  Admitted with altered mental status, presumed sepsis intra-abdominal source based on CT scan.  Worsening renal function over time on chronic kidney disease.  And renal failure.  Hyperkalemia.  Despite best efforts worsening.  Suspect will continue to worsen and ultimately lead to patient death.  Communicated the admit and discussed the family agreed on DNR.  This was explained this in detail and they agreed to description of DNR/DNI.  We discussed wrote for intensive continued aggressive medical therapy which is unlikely to yield results versus focusing on comfort care.  Currently, they feel the best course of action is to continue aggressive care with the limitation of DNR/DNI.  Code status:   Code Status: Limited: Do not attempt resuscitation (DNR) -DNR-LIMITED -Do Not Intubate/DNI    Disposition: Continue current acute care  Time spent for the meeting: 10 minutes    Guerry Leek, MD  01/01/2024, 9:12 AM

## 2024-01-01 NOTE — Plan of Care (Signed)
 Dana Thornton is a 68 y.o. 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 Noted to have recurrence of ascites despite 3L para 4/16. She was started on abx, given IVF and ultimately started on 2mcg NE. Add'l labs w  LA 4.2, Albumin  <1.5 and repeat CBC w plt drop to 80 from 104 a few hours earlier repeat hgb 7.3 from 7.8.   Current issues include acute encephalopathy, hyperammonemia, coagulopathy, anemia, thrombocytopenia, septic shock, diverticulitis, worsening renal dysfunction, metabolic acidosis, hyponatremia, worsening liver enzymes.  Patient briefly visited.  Her husband was by the bedside.  Patient remained confused.  NG tube in place.  Reviewed recent developments.  Discussed plan of care with her husband.  She has several ongoing acute issues that are complicating the clinical picture.  She was made DNR/DNI earlier today.  Recommended comfort care given poor prognosis with multiorgan dysfunction in the setting of septic shock, stage IV hepatocellular carcinoma with worsening liver function.  Her husband verbalized understanding and will make further decision if her clinical course does not improve in the next day or 2.  I will be available to answer any questions or concerns.  I sincerely thank all the specialties involved for providing excellent care for her.

## 2024-01-01 NOTE — Progress Notes (Signed)
 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 Noted to have recurrence of ascites despite 3L para 4/16. She was started on abx, given IVF and ultimately started on 2mcg NE.  Add'l labs w  LA 4.2, Albumin  <1.5 and repeat CBC w plt drop to 80 from 104 a few hours earlier repeat hgb 7.3 from 7.8  PCCM called for admission in this setting    Pertinent  Medical History  Metastatic hepatocellular carcinoma AI hepatitis Hepatic cirrhosis w ascites Abnormal uterine bleeding   Significant Hospital Events: Including procedures, antibiotic start and stop dates in addition to other pertinent events   4/21 admitted. Broad abx IVF pressors TLS labs. 1 PRBC 1 FFP. Zosyn .  Rasburicase    4/22 worse MSOF. Rec consideration of comfort focussed care-- supportive care while family discusses   Interim History / Subjective:  Labs today pending Patient altered; opens eyes to voice; not following commands; perrl Off levo since yesterday  Objective   Blood pressure (!) 102/47, pulse 95, temperature 98.1 F (36.7 C), temperature source Oral, resp. rate 13, height 5\' 4"  (1.626 m), weight 103.9 kg, SpO2 100%.        Intake/Output Summary (Last 24 hours) at 01/01/2024 1053 Last data filed at 01/01/2024 4098 Gross per 24 hour  Intake 343.67 ml  Output 490 ml  Net  -146.33 ml   Filed Weights   12/31/23 0506 12/31/23 2200 01/01/24 0600  Weight: 99.9 kg 102 kg 103.9 kg    Examination:  General: NAD HEENT: MM pink/moist Neuro: altered not following commands for me today; per nurse was answering yes/no questions earlier but confused; eyes open to voice; perrl CV: s1s2, RRR, no m/r/g PULM:  dim clear BS bilaterally GI: soft, bsx4 active  Extremities: warm/dry, no edema  Skin: no rashes or lesions   Resolved Hospital Problem list     Assessment & Plan:    Acute encephalopathy  Hepatic encephalopathy  Advanced stage hepatocellular carcinoma, mets to R adrenal gland (on immunotherapy) Autoimmune hepatitis Hepatic cirrhosis w recurrent ascites Portal hypertension Hyperbilirubinemia Hyperammonemia  Plan: -limit sedating meds -trend LFTs -cont lactulose ; trend ammonia -trend coags -hold lasix and aldactone w/ soft bp  Coagulopathy Anemia Thrombocytopenia Plan: -trend cbc and coags -cont vit K  Septic shock: possible intra-abdominal 2/2 to diverticulitis/possible abscess Plan: -off levo since yesterday -map goal >65 -consider albumin  -zosyn  -follow cultures -consider CCS consult  AKI on CKD 3b, suspect ATN  Metabolic acidosis  Hyperkalemia Hyperphosphatemia Hyponatremia  Plan: -not a dialysis candidate -place cor track and give lokelma  -Trend BMP / urinary output -Replace electrolytes as indicated -Avoid nephrotoxic agents, ensure adequate renal perfusion  Elevated uric acid  Elevated LDH Possible TLS  Plan: -trend uric acid, bmp, phosph, and LDH  Descending and sigmoid diverticulosis, possible acute diverticulitis Possible small peridiverticular abscess vs  fluid filled diverticulum  Cholecystolithiasis  DM Plan: -ssi and cbg monitoring  Hypothyroidism  Plan: -resume home synthroid   HTN Plan: -hold anti-htn meds while bp soft   Best Practice (right click and "Reselect all SmartList Selections" daily)    Diet/type: NPO DVT prophylaxis SCD Pressure ulcer(s): N/A GI prophylaxis: N/A Lines: N/A Foley:  Yes, and it is still needed Code Status:  DNR Last date of multidisciplinary goals of care discussion [4/23 Dr. Marygrace Snellen spoke with family and decision made to be DNR. Continue current care for now.]  Labs   CBC: Recent Labs  Lab 12/30/23 0817 12/30/23 1049 12/30/23 1921 12/31/23 0433  WBC 8.9 9.7 10.7* 12.0*  NEUTROABS 5.0 6.0  --   --   HGB 7.8* 7.3* 7.7* 8.3*  HCT 21.5* 20.8* 23.6* 25.5*  MCV 90.3 93.7 98.3 101.2*  PLT 104* 80* 81* 80*    Basic Metabolic Panel: Recent Labs  Lab 12/30/23 0817 12/30/23 1049 12/30/23 1703 12/30/23 2317 12/31/23 0433 12/31/23 1804 12/31/23 2245 01/01/24 0414 01/01/24 0711  NA 127* 128*  --   --  127*  --   --   --  131*  K 6.3* 6.5* 4.5   < > 6.0* 6.1* 6.2* 5.9* 5.7*  CL 106 107  --   --  106  --   --   --  107  CO2 16* 14*  --   --  12*  --   --   --  17*  GLUCOSE 91 97  --   --  108*  --   --   --  96  BUN 56* 57*  --   --  58*  --   --   --  65*  CREATININE 4.73* 4.82*  --   --  5.10*  --   --   --  6.40*  CALCIUM  8.5* 8.0*  --   --  8.3*  --   --   --  8.0*  MG  --  2.1  --   --   --   --   --   --   --   PHOS  --   --  5.0*  --   --   --   --   --   --    < > = values in this interval not displayed.   GFR: Estimated Creatinine Clearance: 10 mL/min (A) (by C-G formula based on SCr of 6.4 mg/dL (H)). Recent Labs  Lab 12/30/23 0817 12/30/23 1049 12/30/23 1248 12/30/23 1703 12/30/23 1921 12/31/23 0433  WBC 8.9 9.7  --   --  10.7* 12.0*  LATICACIDVEN  --   --  4.2* 6.3* 7.5* 6.5*    Liver Function Tests: Recent Labs  Lab 12/30/23 0817 12/30/23 1049 12/31/23 0433  AST 294* 307* 367*  ALT 124* 124* 155*  ALKPHOS 101 89 81  BILITOT 3.6* 3.3* 5.2*  PROT 9.5* 8.7* 9.2*  ALBUMIN  <1.5* <1.5* 1.9*   Recent Labs  Lab 12/30/23 1049  LIPASE 49   Recent Labs  Lab 12/30/23 1703  AMMONIA 83*    ABG     Component Value Date/Time   PHART 7.401 06/30/2017 1055   PCO2ART 33.4 06/30/2017 1055   PO2ART 58.3 (L) 06/30/2017 1055   HCO3 20.3 06/30/2017 1055   TCO2 18 (L) 06/27/2017 0510   ACIDBASEDEF 3.7 (H) 06/30/2017 1055   O2SAT 89.3 06/30/2017 1055     Coagulation Profile: Recent Labs  Lab 12/30/23 0817 12/30/23  1921  INR 2.8* 2.7*    Cardiac Enzymes: No results for input(s): "CKTOTAL", "CKMB", "CKMBINDEX", "TROPONINI" in the last 168 hours.  HbA1C: Hgb A1c MFr Bld  Date/Time Value Ref Range Status  12/30/2023 05:03 PM 4.7 (L) 4.8 - 5.6 % Final    Comment:    (NOTE) Pre diabetes:          5.7%-6.4%  Diabetes:              >6.4%  Glycemic control for   <7.0% adults with diabetes   11/12/2017 05:01 PM 12.0 (H) 4.8 - 5.6 % Final    Comment:    (NOTE) Pre diabetes:          5.7%-6.4% Diabetes:              >6.4% Glycemic control for   <7.0% adults with diabetes     CBG: Recent Labs  Lab 12/31/23 1206 12/31/23 1940 12/31/23 2346 01/01/24 0336 01/01/24 0738  GLUCAP 121* 110* 98 98 97   CRITICAL CARE Performed by: Casimiro Cleaves   Total critical care time: 35  minutes  JD Vira Grieves [facility]monary [facility] 01/01/2024, 11:14 AM  Please see [email or web address] for pager details.  From 7A-7P if no response, please call [phone]. After hours, please call ELink [phone].

## 2024-01-02 ENCOUNTER — Other Ambulatory Visit: Payer: Self-pay

## 2024-01-02 DIAGNOSIS — R6521 Severe sepsis with septic shock: Secondary | ICD-10-CM

## 2024-01-02 DIAGNOSIS — N179 Acute kidney failure, unspecified: Secondary | ICD-10-CM

## 2024-01-02 DIAGNOSIS — A419 Sepsis, unspecified organism: Secondary | ICD-10-CM | POA: Diagnosis not present

## 2024-01-02 DIAGNOSIS — K7689 Other specified diseases of liver: Secondary | ICD-10-CM

## 2024-01-02 DIAGNOSIS — E875 Hyperkalemia: Secondary | ICD-10-CM | POA: Diagnosis not present

## 2024-01-02 LAB — GLUCOSE, CAPILLARY
Glucose-Capillary: 122 mg/dL — ABNORMAL HIGH (ref 70–99)
Glucose-Capillary: 134 mg/dL — ABNORMAL HIGH (ref 70–99)
Glucose-Capillary: 139 mg/dL — ABNORMAL HIGH (ref 70–99)
Glucose-Capillary: 140 mg/dL — ABNORMAL HIGH (ref 70–99)
Glucose-Capillary: 141 mg/dL — ABNORMAL HIGH (ref 70–99)
Glucose-Capillary: 144 mg/dL — ABNORMAL HIGH (ref 70–99)
Glucose-Capillary: 145 mg/dL — ABNORMAL HIGH (ref 70–99)

## 2024-01-02 LAB — CBC
HCT: 23.3 % — ABNORMAL LOW (ref 36.0–46.0)
Hemoglobin: 7.7 g/dL — ABNORMAL LOW (ref 12.0–15.0)
MCH: 32.4 pg (ref 26.0–34.0)
MCHC: 33 g/dL (ref 30.0–36.0)
MCV: 97.9 fL (ref 80.0–100.0)
Platelets: 83 10*3/uL — ABNORMAL LOW (ref 150–400)
RBC: 2.38 MIL/uL — ABNORMAL LOW (ref 3.87–5.11)
RDW: 16.9 % — ABNORMAL HIGH (ref 11.5–15.5)
WBC: 11.4 10*3/uL — ABNORMAL HIGH (ref 4.0–10.5)
nRBC: 0.4 % — ABNORMAL HIGH (ref 0.0–0.2)

## 2024-01-02 LAB — COMPREHENSIVE METABOLIC PANEL WITH GFR
ALT: 227 U/L — ABNORMAL HIGH (ref 0–44)
AST: 417 U/L — ABNORMAL HIGH (ref 15–41)
Albumin: 1.9 g/dL — ABNORMAL LOW (ref 3.5–5.0)
Alkaline Phosphatase: 69 U/L (ref 38–126)
Anion gap: 9 (ref 5–15)
BUN: 65 mg/dL — ABNORMAL HIGH (ref 8–23)
CO2: 18 mmol/L — ABNORMAL LOW (ref 22–32)
Calcium: 8.1 mg/dL — ABNORMAL LOW (ref 8.9–10.3)
Chloride: 108 mmol/L (ref 98–111)
Creatinine, Ser: 6.26 mg/dL — ABNORMAL HIGH (ref 0.44–1.00)
GFR, Estimated: 7 mL/min — ABNORMAL LOW (ref >60)
Glucose, Bld: 141 mg/dL — ABNORMAL HIGH (ref 70–99)
Potassium: 5.1 mmol/L (ref 3.5–5.1)
Sodium: 135 mmol/L (ref 135–145)
Total Bilirubin: 5.9 mg/dL — ABNORMAL HIGH (ref 0.0–1.2)
Total Protein: 8.6 g/dL — ABNORMAL HIGH (ref 6.5–8.1)

## 2024-01-02 LAB — POTASSIUM
Potassium: 4.9 mmol/L (ref 3.5–5.1)
Potassium: 5.2 mmol/L — ABNORMAL HIGH (ref 3.5–5.1)
Potassium: 4.8 mmol/L (ref 3.5–5.1)
Potassium: 4.6 mmol/L (ref 3.5–5.1)
Potassium: 4.5 mmol/L (ref 3.5–5.1)

## 2024-01-02 LAB — PROTIME-INR
INR: 3.4 — ABNORMAL HIGH (ref 0.8–1.2)
Prothrombin Time: 34.9 s — ABNORMAL HIGH (ref 11.4–15.2)

## 2024-01-02 LAB — URIC ACID: Uric Acid, Serum: <1.5 mg/dL — ABNORMAL LOW (ref 2.5–7.1)

## 2024-01-02 LAB — PHOSPHORUS
Phosphorus: 7.1 mg/dL — ABNORMAL HIGH (ref 2.5–4.6)
Phosphorus: 7.1 mg/dL — ABNORMAL HIGH (ref 2.5–4.6)

## 2024-01-02 LAB — MAGNESIUM
Magnesium: 2.3 mg/dL (ref 1.7–2.4)
Magnesium: 2.3 mg/dL (ref 1.7–2.4)

## 2024-01-02 MED ORDER — PHYTONADIONE 5 MG PO TABS
5.0000 mg | ORAL_TABLET | Freq: Every day | ORAL | Status: DC
  Administered 2024-01-02 – 2024-01-03 (×2): 5 mg
  Filled 2024-01-02 (×2): qty 1

## 2024-01-02 MED ORDER — GUAIFENESIN 100 MG/5ML PO LIQD
10.0000 mL | Freq: Four times a day (QID) | ORAL | Status: AC
  Administered 2024-01-03 – 2024-01-06 (×15): 10 mL
  Filled 2024-01-02 (×16): qty 10

## 2024-01-02 MED ORDER — SODIUM CHLORIDE 0.9% FLUSH
10.0000 mL | Freq: Two times a day (BID) | INTRAVENOUS | Status: DC
  Administered 2024-01-02: 40 mL
  Administered 2024-01-02 – 2024-01-03 (×2): 10 mL
  Administered 2024-01-03: 20 mL
  Administered 2024-01-04: 10 mL
  Administered 2024-01-04: 30 mL
  Administered 2024-01-05: 20 mL
  Administered 2024-01-05: 10 mL
  Administered 2024-01-06: 20 mL
  Administered 2024-01-06 – 2024-01-07 (×3): 10 mL
  Administered 2024-01-08: 20 mL
  Administered 2024-01-08: 10 mL
  Administered 2024-01-09: 20 mL
  Administered 2024-01-09 – 2024-01-11 (×4): 10 mL

## 2024-01-02 MED ORDER — SODIUM CHLORIDE 0.9% FLUSH: 10.0000 mL | INTRAVENOUS | Status: DC | PRN

## 2024-01-02 MED ORDER — MIDODRINE HCL 5 MG PO TABS
10.0000 mg | ORAL_TABLET | Freq: Three times a day (TID) | ORAL | Status: DC
  Administered 2024-01-02 – 2024-01-09 (×21): 10 mg
  Filled 2024-01-02 (×21): qty 2

## 2024-01-02 MED ORDER — MIDODRINE HCL 5 MG PO TABS: 10.0000 mg | ORAL_TABLET | Freq: Three times a day (TID) | ORAL | Status: DC

## 2024-01-02 MED ORDER — ALBUMIN HUMAN 25 % IV SOLN
12.5000 g | Freq: Once | INTRAVENOUS | Status: AC
  Administered 2024-01-02: 12.5 g via INTRAVENOUS
  Filled 2024-01-02: qty 50

## 2024-01-02 NOTE — Progress Notes (Signed)
 Peripherally Inserted Central Catheter Placement  The IV Nurse has discussed with the patient and/or persons authorized to consent for the patient, the purpose of this procedure and the potential benefits and risks involved with this procedure.  The benefits include less needle sticks, lab draws from the catheter, and the patient may be discharged home with the catheter. Risks include, but not limited to, infection, bleeding, blood clot (thrombus formation), and puncture of an artery; nerve damage and irregular heartbeat and possibility to perform a PICC exchange if needed/ordered by physician.  Alternatives to this procedure were also discussed.  Bard Power PICC patient education guide, fact sheet on infection prevention and patient information card has been provided to patient /or left at bedside.    PICC Placement Documentation  PICC Triple Lumen 01/02/24 Right Brachial 36 cm 1 cm (Active)  Indication for Insertion or Continuance of Line Vasoactive infusions 01/02/24 1029  Exposed Catheter (cm) 1 cm 01/02/24 1029  Site Assessment Clean, Dry, Intact 01/02/24 1029  Lumen #1 Status Flushed;Blood return noted;Saline locked 01/02/24 1029  Lumen #2 Status Flushed;Blood return noted;Saline locked 01/02/24 1029  Lumen #3 Status Flushed;Blood return noted;Saline locked 01/02/24 1029  Dressing Type Transparent 01/02/24 1029  Dressing Status Antimicrobial disc/dressing in place 01/02/24 1029  Line Care Connections checked and tightened 01/02/24 1029  Line Adjustment (NICU/IV Team Only) No 01/02/24 1029  Dressing Intervention New dressing 01/02/24 1029  Dressing Change Due 01/09/24 01/02/24 1029       Dana Thornton 01/02/2024, 10:30 AM

## 2024-01-02 NOTE — Plan of Care (Signed)
PICC placed today.

## 2024-01-02 NOTE — Progress Notes (Addendum)
 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 Noted to have recurrence of ascites despite 3L para 4/16. She was started on abx, given IVF and ultimately started on 2mcg NE.  Add'l labs w  LA 4.2, Albumin  <1.5 and repeat CBC w plt drop to 80 from 104 a few hours earlier repeat hgb 7.3 from 7.8  PCCM called for admission in this setting    Pertinent  Medical History  Metastatic hepatocellular carcinoma AI hepatitis Hepatic cirrhosis w ascites Abnormal uterine bleeding   Significant Hospital Events: Including procedures, antibiotic start and stop dates in addition to other pertinent events   4/21 admitted. Broad abx IVF pressors TLS labs. 1 PRBC 1 FFP. Zosyn .  Rasburicase    4/22 worse MSOF. Rec consideration of comfort focussed care-- supportive care while family discusses  4/24 back on levo  Interim History / Subjective:  On 4 mcg levo UOP 150 last 24 hours; creat 6.26  Objective   Blood pressure 90/66, pulse 91, temperature 97.7 F (36.5 C), temperature source Axillary, resp. rate 11, height 5\' 4"  (1.626 m), weight 103.4 kg, SpO2 100%.        Intake/Output Summary (Last 24 hours) at 01/02/2024 8295 Last data filed at 01/02/2024 0500 Gross per 24 hour  Intake 698.68 ml  Output 150 ml  Net 548.68 ml   Filed Weights   12/31/23 2200  01/01/24 0600 01/02/24 0500  Weight: 102 kg 103.9 kg 103.4 kg    Examination:  General: NAD HEENT: MM pink/moist Neuro: altered not following commands; eyes open to voice; perrl CV: s1s2, RRR, no m/r/g PULM:  dim clear BS bilaterally GI: soft, bsx4 active  Extremities: warm/dry, no edema  Skin: no rashes or lesions   Resolved Hospital Problem list     Assessment & Plan:    Acute encephalopathy  Hepatic encephalopathy  Advanced stage hepatocellular carcinoma, mets to R adrenal gland (on immunotherapy) Autoimmune hepatitis Hepatic cirrhosis w recurrent ascites Portal hypertension Hyperbilirubinemia Hyperammonemia  Plan: -limit sedating meds -trend LFTs -cont lactulose ; trend ammonia -trend coags; increase vit K -hold lasix and aldactone w/ soft bp  Coagulopathy Anemia Thrombocytopenia Plan: -trend cbc and coags -cont vit K  Septic shock: possible intra-abdominal 2/2 to diverticulitis/possible abscess Plan: -levo for map goal >65 -give albumin  -zosyn  -follow cultures  AKI on CKD 3b, suspect ATN  Metabolic acidosis  Hyperkalemia Hyperphosphatemia Hyponatremia  Plan: -not a dialysis candidate -Trend BMP / urinary output -Replace electrolytes as indicated -Avoid nephrotoxic agents, ensure adequate renal perfusion  Elevated uric acid  Elevated LDH Possible TLS  Plan: -trend uric acid, bmp, phosph, and LDH  Descending and sigmoid diverticulosis, possible acute diverticulitis Possible small peridiverticular abscess vs fluid filled diverticulum  Cholecystolithiasis  DM Plan: -ssi and cbg monitoring  Hypothyroidism  Plan: -resume home synthroid   HTN Plan: -hold  anti-htn meds while bp soft   Best Practice (right click and "Reselect all SmartList Selections" daily)   Diet/type: tubefeeds DVT prophylaxis SCD Pressure ulcer(s): N/A GI prophylaxis: N/A Lines: N/A Foley:  Yes, and it is still needed Code Status:  DNR Last date of  multidisciplinary goals of care discussion [4/23 Dr. Marygrace Snellen spoke with family and decision made to be DNR. 4/24 updated family at bedside and want to continue current care but family agrees if patient worsens would likely want to transition to comfort care at that time.]  Labs   CBC: Recent Labs  Lab 12/30/23 0817 12/30/23 1049 12/30/23 1921 12/31/23 0433 01/01/24 0952 01/02/24 0301  WBC 8.9 9.7 10.7* 12.0* 8.6 11.4*  NEUTROABS 5.0 6.0  --   --   --   --   HGB 7.8* 7.3* 7.7* 8.3* 7.9* 7.7*  HCT 21.5* 20.8* 23.6* 25.5* 22.8* 23.3*  MCV 90.3 93.7 98.3 101.2* 95.8 97.9  PLT 104* 80* 81* 80* 70* 83*    Basic Metabolic Panel: Recent Labs  Lab 12/30/23 1049 12/30/23 1703 12/30/23 2317 12/31/23 0433 12/31/23 1804 01/01/24 0711 01/01/24 0952 01/01/24 1232 01/01/24 1712 01/01/24 2200 01/02/24 0301 01/02/24 0620  NA 128*  --   --  127*  --  131* 133*  --   --   --  135  --   K 6.5* 4.5   < > 6.0*   < > 5.7* 4.7  4.9 5.1 5.0 4.8 5.1 4.9  CL 107  --   --  106  --  107 106  --   --   --  108  --   CO2 14*  --   --  12*  --  17* 14*  --   --   --  18*  --   GLUCOSE 97  --   --  108*  --  96 88  --   --   --  141*  --   BUN 57*  --   --  58*  --  65* 64*  --   --   --  65*  --   CREATININE 4.82*  --   --  5.10*  --  6.40* 6.22*  --   --   --  6.26*  --   CALCIUM  8.0*  --   --  8.3*  --  8.0* 8.2*  --   --   --  8.1*  --   MG 2.1  --   --   --   --   --   --   --  2.2  --  2.3  --   PHOS  --  5.0*  --   --   --   --   --   --  6.7*  --  7.1*  --    < > = values in this interval not displayed.   GFR: Estimated Creatinine Clearance: 10.2 mL/min (A) (by C-G formula based on SCr of 6.26 mg/dL (H)). Recent Labs  Lab 12/30/23 1248 12/30/23 1703 12/30/23 1921 12/31/23 0433 01/01/24 0952 01/02/24 0301  WBC  --   --  10.7* 12.0* 8.6 11.4*  LATICACIDVEN 4.2* 6.3* 7.5* 6.5*  --   --     Liver Function Tests: Recent Labs  Lab 12/30/23 0817 12/30/23 1049 12/31/23 0433  01/01/24 0952 01/02/24 0301  AST 294* 307* 367* 490* 417*  ALT 124* 124* 155* 252* 227*  ALKPHOS 101 89 81 72 69  BILITOT  3.6* 3.3* 5.2* 5.1* 5.9*  PROT 9.5* 8.7* 9.2* 8.0 8.6*  ALBUMIN  <1.5* <1.5* 1.9* 1.8* 1.9*   Recent Labs  Lab 12/30/23 1049  LIPASE 49   Recent Labs  Lab 12/30/23 1703 01/01/24 1232  AMMONIA 83* 55*    ABG    Component Value Date/Time   PHART 7.401 06/30/2017 1055   PCO2ART 33.4 06/30/2017 1055   PO2ART 58.3 (L) 06/30/2017 1055   HCO3 20.3 06/30/2017 1055   TCO2 18 (L) 06/27/2017 0510   ACIDBASEDEF 3.7 (H) 06/30/2017 1055   O2SAT 89.3 06/30/2017 1055     Coagulation Profile: Recent Labs  Lab 12/30/23 0817 12/30/23 1921 01/02/24 0301  INR 2.8* 2.7* 3.4*    Cardiac Enzymes: No results for input(s): "CKTOTAL", "CKMB", "CKMBINDEX", "TROPONINI" in the last 168 hours.  HbA1C: Hgb A1c MFr Bld  Date/Time Value Ref Range Status  12/30/2023 05:03 PM 4.7 (L) 4.8 - 5.6 % Final    Comment:    (NOTE) Pre diabetes:          5.7%-6.4%  Diabetes:              >6.4%  Glycemic control for   <7.0% adults with diabetes   11/12/2017 05:01 PM 12.0 (H) 4.8 - 5.6 % Final    Comment:    (NOTE) Pre diabetes:          5.7%-6.4% Diabetes:              >6.4% Glycemic control for   <7.0% adults with diabetes     CBG: Recent Labs  Lab 01/01/24 1138 01/01/24 1701 01/01/24 1952 01/02/24 0000 01/02/24 0332  GLUCAP 83 89 115* 122* 134*   CRITICAL CARE Performed by: Casimiro Cleaves   Total critical care time: 35  minutes  JD Vira Grieves [facility]monary [facility] 01/02/2024, 7:14 AM  Please see [email or web address] for pager details.  From 7A-7P if no response, please call [phone]. After hours, please call ELink [phone].

## 2024-01-02 NOTE — Progress Notes (Signed)
   01/02/24 1017  Spiritual Encounters  Type of Visit Follow up  Care provided to: [facility] partners present during encounter Nurse  Reason for visit Routine spiritual support  OnCall Visit No   Attempted to revisited with patient, however the interdisciplinary team where performing a sterile procedure at the time. Family sent to waiting area. Went to waiting area and joined family, sons and in Social worker,. Provided spiritual in the form of support to waiting family members in waiting area.

## 2024-01-03 DIAGNOSIS — N170 Acute kidney failure with tubular necrosis: Secondary | ICD-10-CM | POA: Diagnosis not present

## 2024-01-03 DIAGNOSIS — R6521 Severe sepsis with septic shock: Secondary | ICD-10-CM | POA: Diagnosis not present

## 2024-01-03 DIAGNOSIS — A419 Sepsis, unspecified organism: Secondary | ICD-10-CM | POA: Diagnosis not present

## 2024-01-03 DIAGNOSIS — N179 Acute kidney failure, unspecified: Secondary | ICD-10-CM | POA: Diagnosis not present

## 2024-01-03 LAB — POTASSIUM
Potassium: 4.3 mmol/L (ref 3.5–5.1)
Potassium: 4.2 mmol/L (ref 3.5–5.1)
Potassium: 4.2 mmol/L (ref 3.5–5.1)

## 2024-01-03 LAB — COMPREHENSIVE METABOLIC PANEL WITH GFR
ALT: 163 U/L — ABNORMAL HIGH (ref 0–44)
AST: 267 U/L — ABNORMAL HIGH (ref 15–41)
Albumin: 1.7 g/dL — ABNORMAL LOW (ref 3.5–5.0)
Alkaline Phosphatase: 63 U/L (ref 38–126)
Anion gap: 9 (ref 5–15)
BUN: 63 mg/dL — ABNORMAL HIGH (ref 8–23)
CO2: 19 mmol/L — ABNORMAL LOW (ref 22–32)
Calcium: 7.2 mg/dL — ABNORMAL LOW (ref 8.9–10.3)
Chloride: 104 mmol/L (ref 98–111)
Creatinine, Ser: 6.26 mg/dL — ABNORMAL HIGH (ref 0.44–1.00)
GFR, Estimated: 7 mL/min — ABNORMAL LOW (ref >60)
Glucose, Bld: 218 mg/dL — ABNORMAL HIGH (ref 70–99)
Potassium: 4.3 mmol/L (ref 3.5–5.1)
Sodium: 132 mmol/L — ABNORMAL LOW (ref 135–145)
Total Bilirubin: 4.5 mg/dL — ABNORMAL HIGH (ref 0.0–1.2)
Total Protein: 7.6 g/dL (ref 6.5–8.1)

## 2024-01-03 LAB — CBC
HCT: 19.5 % — ABNORMAL LOW (ref 36.0–46.0)
Hemoglobin: 6.6 g/dL — CL (ref 12.0–15.0)
MCH: 33.3 pg (ref 26.0–34.0)
MCHC: 33.8 g/dL (ref 30.0–36.0)
MCV: 98.5 fL (ref 80.0–100.0)
Platelets: 67 10*3/uL — ABNORMAL LOW (ref 150–400)
RBC: 1.98 MIL/uL — ABNORMAL LOW (ref 3.87–5.11)
RDW: 16.6 % — ABNORMAL HIGH (ref 11.5–15.5)
WBC: 10.1 10*3/uL (ref 4.0–10.5)
nRBC: 0.2 % (ref 0.0–0.2)

## 2024-01-03 LAB — GLUCOSE, CAPILLARY
Glucose-Capillary: 131 mg/dL — ABNORMAL HIGH (ref 70–99)
Glucose-Capillary: 146 mg/dL — ABNORMAL HIGH (ref 70–99)
Glucose-Capillary: 154 mg/dL — ABNORMAL HIGH (ref 70–99)
Glucose-Capillary: 161 mg/dL — ABNORMAL HIGH (ref 70–99)
Glucose-Capillary: 162 mg/dL — ABNORMAL HIGH (ref 70–99)
Glucose-Capillary: 173 mg/dL — ABNORMAL HIGH (ref 70–99)

## 2024-01-03 LAB — LACTATE DEHYDROGENASE: LDH: 199 U/L — ABNORMAL HIGH (ref 98–192)

## 2024-01-03 LAB — HEMOGLOBIN AND HEMATOCRIT, BLOOD
HCT: 23.4 % — ABNORMAL LOW (ref 36.0–46.0)
Hemoglobin: 7.7 g/dL — ABNORMAL LOW (ref 12.0–15.0)

## 2024-01-03 LAB — MAGNESIUM: Magnesium: 2.2 mg/dL (ref 1.7–2.4)

## 2024-01-03 LAB — PHOSPHORUS: Phosphorus: 6.1 mg/dL — ABNORMAL HIGH (ref 2.5–4.6)

## 2024-01-03 LAB — PREPARE RBC (CROSSMATCH)

## 2024-01-03 LAB — AMMONIA: Ammonia: 96 umol/L — ABNORMAL HIGH (ref 9–35)

## 2024-01-03 MED ORDER — PANTOPRAZOLE SODIUM 40 MG IV SOLR
40.0000 mg | Freq: Two times a day (BID) | INTRAVENOUS | Status: DC
  Administered 2024-01-03 – 2024-01-11 (×17): 40 mg via INTRAVENOUS
  Filled 2024-01-03 (×17): qty 10

## 2024-01-03 MED ORDER — DEXTROMETHORPHAN POLISTIREX ER 30 MG/5ML PO SUER
30.0000 mg | Freq: Two times a day (BID) | ORAL | Status: DC
  Administered 2024-01-03 – 2024-01-06 (×7): 30 mg
  Filled 2024-01-03 (×7): qty 5

## 2024-01-03 MED ORDER — SODIUM CHLORIDE 0.9% IV SOLUTION: Freq: Once | INTRAVENOUS | Status: AC

## 2024-01-03 MED ORDER — ALBUMIN HUMAN 25 % IV SOLN
12.5000 g | Freq: Four times a day (QID) | INTRAVENOUS | Status: AC
  Administered 2024-01-03 (×4): 12.5 g via INTRAVENOUS
  Filled 2024-01-03 (×4): qty 50

## 2024-01-03 MED ORDER — LACTULOSE 10 GM/15ML PO SOLN
20.0000 g | Freq: Three times a day (TID) | ORAL | Status: DC
  Administered 2024-01-03 – 2024-01-11 (×25): 20 g
  Filled 2024-01-03 (×24): qty 30

## 2024-01-03 NOTE — Progress Notes (Signed)
 eLink Physician-Brief Progress Note Patient Name: Dana Thornton DOB: 07/13/1956 MRN: [id number]   Date of Service  01/03/2024  HPI/Events of Note  Hemoglobin 6.6 gm / dl.  eICU Interventions  One unit PRBC ordered transfused.        Willene Holian U Clarine Elrod 01/03/2024, 6:34 AM

## 2024-01-03 NOTE — Plan of Care (Signed)
  Problem: Nutrition: Goal: Adequate nutrition will be maintained Outcome: Progressing   Problem: Education: Goal: Ability to describe self-care measures that may prevent or decrease complications (Diabetes Survival Skills Education) will improve Outcome: Not Progressing Goal: Individualized Educational Video(s) Outcome: Not Progressing   Problem: Coping: Goal: Ability to adjust to condition or change in health will improve Outcome: Not Progressing   Problem: Fluid Volume: Goal: Ability to maintain a balanced intake and output will improve Outcome: Not Progressing   Problem: Health Behavior/Discharge Planning: Goal: Ability to identify and utilize available resources and services will improve Outcome: Not Progressing Goal: Ability to manage health-related needs will improve Outcome: Not Progressing   Problem: Metabolic: Goal: Ability to maintain appropriate glucose levels will improve Outcome: Not Progressing   Problem: Nutritional: Goal: Maintenance of adequate nutrition will improve Outcome: Not Progressing Goal: Progress toward achieving an optimal weight will improve Outcome: Not Progressing   Problem: Skin Integrity: Goal: Risk for impaired skin integrity will decrease Outcome: Not Progressing   Problem: Tissue Perfusion: Goal: Adequacy of tissue perfusion will improve Outcome: Not Progressing   Problem: Education: Goal: Knowledge of General Education information will improve Description: Including pain rating scale, medication(s)/side effects and non-pharmacologic comfort measures Outcome: Not Progressing   Problem: Health Behavior/Discharge Planning: Goal: Ability to manage health-related needs will improve Outcome: Not Progressing   Problem: Clinical Measurements: Goal: Ability to maintain clinical measurements within normal limits will improve Outcome: Not Progressing Goal: Will remain free from infection Outcome: Not Progressing Goal: Diagnostic test  results will improve Outcome: Not Progressing Goal: Respiratory complications will improve Outcome: Not Progressing Goal: Cardiovascular complication will be avoided Outcome: Not Progressing   Problem: Activity: Goal: Risk for activity intolerance will decrease Outcome: Not Progressing   Problem: Coping: Goal: Level of anxiety will decrease Outcome: Not Progressing   Problem: Elimination: Goal: Will not experience complications related to bowel motility Outcome: Not Progressing Goal: Will not experience complications related to urinary retention Outcome: Not Progressing   Problem: Pain Managment: Goal: General experience of comfort will improve and/or be controlled Outcome: Not Progressing   Problem: Safety: Goal: Ability to remain free from injury will improve Outcome: Not Progressing   Problem: Skin Integrity: Goal: Risk for impaired skin integrity will decrease Outcome: Not Progressing

## 2024-01-03 NOTE — Progress Notes (Signed)
 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 Noted to have recurrence of ascites despite 3L para 4/16. She was started on abx, given IVF and ultimately started on 2mcg NE.  Add'l labs w  LA 4.2, Albumin  <1.5 and repeat CBC w plt drop to 80 from 104 a few hours earlier repeat hgb 7.3 from 7.8  PCCM called for admission in this setting    Pertinent  Medical History  Metastatic hepatocellular carcinoma AI hepatitis Hepatic cirrhosis w ascites Abnormal uterine bleeding   Significant Hospital Events: Including procedures, antibiotic start and stop dates in addition to other pertinent events   4/21 admitted. Broad abx IVF pressors TLS labs. 1 PRBC 1 FFP. Zosyn .  Rasburicase    4/22 worse MSOF. Rec consideration of comfort focussed care-- supportive care while family discusses  4/24 back on levo  Interim History / Subjective:  On 3 mcg levo Hgb drop to 6.6; receiving 1 unit PRBC; no evidence of bleeding UOP midly improved to 620 last 24 hours; only 150 uop the day prior  Objective   Blood pressure (!) 165/91, pulse 99, temperature 98.5 F (36.9 C), temperature source Axillary, resp. rate 17, height 5\' 4"  (1.626 m), weight 103.9 kg, SpO2 99%.        Intake/Output Summary (Last 24 hours) at 01/03/2024 0710 Last data filed at 01/03/2024 0700 Gross  per 24 hour  Intake 1292.58 ml  Output 620 ml  Net 672.58 ml   Filed Weights   01/01/24 0600 01/02/24 0500 01/03/24 0500  Weight: 103.9 kg 103.4 kg 103.9 kg    Examination:  General: NAD HEENT: MM pink/moist Neuro: altered not following commands; eyes open to voice; perrl CV: s1s2, RRR, no m/r/g PULM:  dim clear BS bilaterally GI: soft, distended; bsx4 active  Extremities: warm/dry, trace edema  Skin: no rashes or lesions   Resolved Hospital Problem list     Assessment & Plan:    Acute encephalopathy  Hepatic encephalopathy  Advanced stage hepatocellular carcinoma, mets to R adrenal gland (on immunotherapy) Autoimmune hepatitis Hepatic cirrhosis w recurrent ascites Portal hypertension Hyperbilirubinemia Hyperammonemia  Plan: -limit sedating meds -trend LFTs -increase lactulose ; trend ammonia -trend coags; cont vit K -hold lasix and aldactone w/ soft bp -albumin  as needed  Coagulopathy Anemia Thrombocytopenia Plan: -4/25 hgb dropped getting 1 unit PRBC; no evidence of bleeding -will start on PPI BID -trend cbc and coags -cont vit K  Septic shock: possible intra-abdominal 2/2 to diverticulitis/possible abscess Plan: -levo for map goal >65 -albumin  -zosyn  -follow cultures  AKI on CKD 3b, suspect ATN  Metabolic acidosis  Hyperkalemia Hyperphosphatemia Hyponatremia  Plan: -not a dialysis candidate -Trend BMP / urinary output -Replace electrolytes as indicated -Avoid nephrotoxic agents, ensure adequate renal perfusion  Elevated uric acid  Elevated LDH Possible TLS  Plan: -improving -trend uric acid,  bmp, phosph, and LDH  Descending and sigmoid diverticulosis, possible acute diverticulitis Possible small peridiverticular abscess vs fluid filled diverticulum  Cholecystolithiasis  DM Plan: -ssi and cbg monitoring  Hypothyroidism  Plan: -resume home synthroid   HTN Plan: -hold anti-htn meds while bp soft   Best Practice (right click  and "Reselect all SmartList Selections" daily)   Diet/type: tubefeeds DVT prophylaxis SCD Pressure ulcer(s): N/A GI prophylaxis: N/A Lines: N/A Foley:  Yes, and it is still needed Code Status:  DNR Last date of multidisciplinary goals of care discussion [4/23 Dr. Marygrace Snellen spoke with family and decision made to be DNR. 4/24 updated family at bedside and want to continue current care but family agrees if patient worsens would likely want to transition to comfort care at that time. 4/25 updated husband at bedside]  Labs   CBC: Recent Labs  Lab 12/30/23 0817 12/30/23 0817 12/30/23 1049 12/30/23 1921 12/31/23 0433 01/01/24 0952 01/02/24 0301 01/03/24 0444  WBC 8.9   < > 9.7 10.7* 12.0* 8.6 11.4* 10.1  NEUTROABS 5.0  --  6.0  --   --   --   --   --   HGB 7.8*   < > 7.3* 7.7* 8.3* 7.9* 7.7* 6.6*  HCT 21.5*  --  20.8* 23.6* 25.5* 22.8* 23.3* 19.5*  MCV 90.3  --  93.7 98.3 101.2* 95.8 97.9 98.5  PLT 104*   < > 80* 81* 80* 70* 83* 67*   < > = values in this interval not displayed.    Basic Metabolic Panel: Recent Labs  Lab 12/30/23 1049 12/30/23 1703 12/30/23 2317 12/31/23 0433 12/31/23 1804 01/01/24 0711 01/01/24 1610 01/01/24 1232 01/01/24 1712 01/01/24 2200 01/02/24 0301 01/02/24 0620 01/02/24 1900 01/02/24 2206 01/03/24 0244 01/03/24 0444 01/03/24 0629  NA 128*  --   --  127*  --  131* 133*  --   --   --  135  --   --   --   --  132*  --   K 6.5* 4.5   < > 6.0*   < > 5.7* 4.7  4.9   < > 5.0   < > 5.1   < > 4.6 4.5 4.3 4.3 4.2  CL 107  --   --  106  --  107 106  --   --   --  108  --   --   --   --  104  --   CO2 14*  --   --  12*  --  17* 14*  --   --   --  18*  --   --   --   --  19*  --   GLUCOSE 97  --   --  108*  --  96 88  --   --   --  141*  --   --   --   --  218*  --   BUN 57*  --   --  58*  --  65* 64*  --   --   --  65*  --   --   --   --  63*  --   CREATININE 4.82*  --   --  5.10*  --  6.40* 6.22*  --   --   --  6.26*  --   --   --   --  6.26*  --    CALCIUM  8.0*  --   --  8.3*  --  8.0*  8.2*  --   --   --  8.1*  --   --   --   --  7.2*  --   MG 2.1  --   --   --   --   --   --   --  2.2  --  2.3  --  2.3  --   --  2.2  --   PHOS  --  5.0*  --   --   --   --   --   --  6.7*  --  7.1*  --  7.1*  --   --  6.1*  --    < > = values in this interval not displayed.   GFR: Estimated Creatinine Clearance: 10.2 mL/min (A) (by C-G formula based on SCr of 6.26 mg/dL (H)). Recent Labs  Lab 12/30/23 1248 12/30/23 1703 12/30/23 1921 12/31/23 0433 01/01/24 0952 01/02/24 0301 01/03/24 0444  WBC  --   --  10.7* 12.0* 8.6 11.4* 10.1  LATICACIDVEN 4.2* 6.3* 7.5* 6.5*  --   --   --     Liver Function Tests: Recent Labs  Lab 12/30/23 1049 12/31/23 0433 01/01/24 0952 01/02/24 0301 01/03/24 0444  AST 307* 367* 490* 417* 267*  ALT 124* 155* 252* 227* 163*  ALKPHOS 89 81 72 69 63  BILITOT 3.3* 5.2* 5.1* 5.9* 4.5*  PROT 8.7* 9.2* 8.0 8.6* 7.6  ALBUMIN  <1.5* 1.9* 1.8* 1.9* 1.7*   Recent Labs  Lab 12/30/23 1049  LIPASE 49   Recent Labs  Lab 12/30/23 1703 01/01/24 1232 01/03/24 0444  AMMONIA 83* 55* 96*    ABG    Component Value Date/Time   PHART 7.401 06/30/2017 1055   PCO2ART 33.4 06/30/2017 1055   PO2ART 58.3 (L) 06/30/2017 1055   HCO3 20.3 06/30/2017 1055   TCO2 18 (L) 06/27/2017 0510   ACIDBASEDEF 3.7 (H) 06/30/2017 1055   O2SAT 89.3 06/30/2017 1055     Coagulation Profile: Recent Labs  Lab 12/30/23 0817 12/30/23 1921 01/02/24 0301  INR 2.8* 2.7* 3.4*    Cardiac Enzymes: No results for input(s): "CKTOTAL", "CKMB", "CKMBINDEX", "TROPONINI" in the last 168 hours.  HbA1C: Hgb A1c MFr Bld  Date/Time Value Ref Range Status  12/30/2023 05:03 PM 4.7 (L) 4.8 - 5.6 % Final    Comment:    (NOTE) Pre diabetes:          5.7%-6.4%  Diabetes:              >6.4%  Glycemic control for   <7.0% adults with diabetes   11/12/2017 05:01 PM 12.0 (H) 4.8 - 5.6 % Final    Comment:    (NOTE) Pre diabetes:           5.7%-6.4% Diabetes:              >6.4% Glycemic control for   <7.0% adults with diabetes     CBG: Recent Labs  Lab 01/02/24 1141 01/02/24 1639 01/02/24 1951 01/02/24 2312 01/03/24 0336  GLUCAP 145* 140* 139* 144* 146*   CRITICAL CARE Performed by: Casimiro Cleaves   Total critical care time: 35  minutes  JD Vir[facility] 01/03/2024, 7:10 AM  Please see [email or web address] for pager details.  From 7A-7P if no response, please call [phone]. After hours, please call ELink [phone].

## 2024-01-04 DIAGNOSIS — N170 Acute kidney failure with tubular necrosis: Secondary | ICD-10-CM | POA: Diagnosis not present

## 2024-01-04 DIAGNOSIS — N179 Acute kidney failure, unspecified: Secondary | ICD-10-CM | POA: Diagnosis not present

## 2024-01-04 DIAGNOSIS — R6521 Severe sepsis with septic shock: Secondary | ICD-10-CM | POA: Diagnosis not present

## 2024-01-04 DIAGNOSIS — A419 Sepsis, unspecified organism: Secondary | ICD-10-CM | POA: Diagnosis not present

## 2024-01-04 LAB — CBC
HCT: 23.9 % — ABNORMAL LOW (ref 36.0–46.0)
Hemoglobin: 7.9 g/dL — ABNORMAL LOW (ref 12.0–15.0)
MCH: 32.2 pg (ref 26.0–34.0)
MCHC: 33.1 g/dL (ref 30.0–36.0)
MCV: 97.6 fL (ref 80.0–100.0)
Platelets: 58 10*3/uL — ABNORMAL LOW (ref 150–400)
RBC: 2.45 MIL/uL — ABNORMAL LOW (ref 3.87–5.11)
RDW: 16.7 % — ABNORMAL HIGH (ref 11.5–15.5)
WBC: 10.9 10*3/uL — ABNORMAL HIGH (ref 4.0–10.5)
nRBC: 0.2 % (ref 0.0–0.2)

## 2024-01-04 LAB — CULTURE, BLOOD (ROUTINE X 2)
Culture: NO GROWTH
Culture: NO GROWTH
Special Requests: ADEQUATE

## 2024-01-04 LAB — GLUCOSE, CAPILLARY
Glucose-Capillary: 173 mg/dL — ABNORMAL HIGH (ref 70–99)
Glucose-Capillary: 161 mg/dL — ABNORMAL HIGH (ref 70–99)
Glucose-Capillary: 161 mg/dL — ABNORMAL HIGH (ref 70–99)
Glucose-Capillary: 152 mg/dL — ABNORMAL HIGH (ref 70–99)
Glucose-Capillary: 162 mg/dL — ABNORMAL HIGH (ref 70–99)

## 2024-01-04 LAB — COMPREHENSIVE METABOLIC PANEL WITH GFR
ALT: 126 U/L — ABNORMAL HIGH (ref 0–44)
AST: 208 U/L — ABNORMAL HIGH (ref 15–41)
Albumin: 2.2 g/dL — ABNORMAL LOW (ref 3.5–5.0)
Alkaline Phosphatase: 74 U/L (ref 38–126)
Anion gap: 11 (ref 5–15)
BUN: 68 mg/dL — ABNORMAL HIGH (ref 8–23)
CO2: 19 mmol/L — ABNORMAL LOW (ref 22–32)
Calcium: 8 mg/dL — ABNORMAL LOW (ref 8.9–10.3)
Chloride: 112 mmol/L — ABNORMAL HIGH (ref 98–111)
Creatinine, Ser: 5.83 mg/dL — ABNORMAL HIGH (ref 0.44–1.00)
GFR, Estimated: 7 mL/min — ABNORMAL LOW (ref >60)
Glucose, Bld: 163 mg/dL — ABNORMAL HIGH (ref 70–99)
Potassium: 4.1 mmol/L (ref 3.5–5.1)
Sodium: 142 mmol/L (ref 135–145)
Total Bilirubin: 4.4 mg/dL — ABNORMAL HIGH (ref 0.0–1.2)
Total Protein: 7.9 g/dL (ref 6.5–8.1)

## 2024-01-04 LAB — AMMONIA: Ammonia: 35 umol/L (ref 9–35)

## 2024-01-04 LAB — PHOSPHORUS: Phosphorus: 5.3 mg/dL — ABNORMAL HIGH (ref 2.5–4.6)

## 2024-01-04 LAB — APTT: aPTT: 56 s — ABNORMAL HIGH (ref 24–36)

## 2024-01-04 LAB — PROTIME-INR
INR: 4 — ABNORMAL HIGH (ref 0.8–1.2)
Prothrombin Time: 38.9 s — ABNORMAL HIGH (ref 11.4–15.2)

## 2024-01-04 MED ORDER — PHYTONADIONE 5 MG PO TABS
5.0000 mg | ORAL_TABLET | Freq: Every day | ORAL | Status: AC
  Administered 2024-01-04: 5 mg
  Filled 2024-01-04: qty 1

## 2024-01-04 NOTE — Progress Notes (Signed)
 eLink Physician-Brief Progress Note Patient Name: Dana Thornton DOB: 04-28-1956 MRN: [id number]   Date of Service  01/04/2024  HPI/Events of Note  Large loose stool. RN request for flexiseal  eICU Interventions  Order placed     Intervention Category Minor Interventions: Other:  Constance Whittle Genetta Kenning 01/04/2024, 4:18 AM

## 2024-01-04 NOTE — Progress Notes (Signed)
 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 Noted to have recurrence of ascites despite 3L para 4/16. She was started on abx, given IVF and ultimately started on 2mcg NE.  Add'l labs w  LA 4.2, Albumin  <1.5 and repeat CBC w plt drop to 80 from 104 a few hours earlier repeat hgb 7.3 from 7.8  PCCM called for admission in this setting    Pertinent  Medical History  Metastatic hepatocellular carcinoma AI hepatitis Hepatic cirrhosis w ascites Abnormal uterine bleeding   Significant Hospital Events: Including procedures, antibiotic start and stop dates in addition to other pertinent events   4/21 admitted. Broad abx IVF pressors TLS labs. 1 PRBC 1 FFP. Zosyn .  Rasburicase    4/22 worse MSOF. Rec consideration of comfort focussed care-- supportive care while family discusses  4/24 back on levo  Interim History / Subjective:  No longer on pressors Urine output 300 cc last 24 hours Serum creatinine 6.26  Objective   Blood pressure (!) 140/78, pulse 92, temperature 98 F (36.7 C), temperature source Axillary, resp. rate 20, height 5\' 4"  (1.626 m), weight 103.9 kg, SpO2 96%.        Intake/Output Summary (Last 24 hours) at 01/04/2024 0726 Last data filed at 01/03/2024 1821 Gross per 24 hour  Intake 1392.2 ml  Output 300 ml  Net 1092.2 ml   Filed  Weights   01/02/24 0500 01/03/24 0500 01/04/24 0500  Weight: 103.4 kg 103.9 kg 103.9 kg    Examination:  General: Obese woman, coughing but no distress HEENT: Cough, strong voice, NG tube in place Neuro: Awake, tracks, able to answer simple questions, follows commands CV: Distant, regular, no murmur PULM: Decreased to both bases, no wheezes or crackles GI: Obese, nondistended with positive bowel sounds Extremities: Trace to 1+ lower extremity edema Skin: No rash  Resolved Hospital Problem list     Assessment & Plan:    Acute encephalopathy  Hepatic encephalopathy  Advanced stage hepatocellular carcinoma, mets to R adrenal gland (on immunotherapy) Autoimmune hepatitis Hepatic cirrhosis w recurrent ascites Portal hypertension Hyperbilirubinemia Hyperammonemia  Plan: -Intermittent follow LFT, coags -1 more day of vitamin K  - Holding Aldactone and Lasix given relative hypotension - Form resuscitation with albumin  if needed based on hemodynamics -Limit sedating medications  Coagulopathy Anemia Thrombocytopenia Plan: -Hemoglobin 7.7 on 4/25, following intermittently.  No evidence of active bleeding - Empiric PPI twice daily - Vitamin K  as above  Septic shock: possible intra-abdominal 2/2 to diverticulitis/possible abscess Plan: -Norepinephrine  weaned off - Volume resuscitation as above - Day 5 Zosyn  on 4/26 -Follow culture data  AKI on CKD 3b, suspect ATN  Metabolic acidosis  Hyperkalemia Hyperphosphatemia Hyponatremia  Plan: -Not a dialysis candidate, remains oliguric - Following BMP, urine output - Follow electrolytes, replete as indicated - Avoid nephrotoxins, ensure adequate renal perfusion  Elevated uric acid  Elevated LDH Possible TLS  Plan: -Follow intermittent uric acid, LDH  Descending and sigmoid diverticulosis, possible acute diverticulitis Possible small peridiverticular abscess vs fluid filled diverticulum  Cholecystolithiasis  Plan: -  Following, not a surgical candidate  DM Plan: -Continue sliding scale insulin   Hypothyroidism  Plan: -Continue home Synthroid   HTN Plan: -Home antihypertensive medications on hold   Best Practice (right click and "Reselect all SmartList Selections" daily)   Diet/type: tubefeeds DVT prophylaxis SCD Pressure ulcer(s): N/A GI prophylaxis: N/A Lines: N/A Foley:  Yes, and it is still needed Code Status:  DNR Last date of multidisciplinary goals of care discussion [4/23 Dr. Marygrace Snellen spoke with family and decision made to be DNR. 4/24 updated family at bedside and want to continue current care but family agrees if patient worsens would likely want to transition to comfort care at that time. 4/25 updated husband and children at bedside.]  Labs   CBC: Recent Labs  Lab 12/30/23 0817 12/30/23 0817 12/30/23 1049 12/30/23 1921 12/31/23 0433 01/01/24 0952 01/02/24 0301 01/03/24 0444 01/03/24 1451  WBC 8.9   < > 9.7 10.7* 12.0* 8.6 11.4* 10.1  --   NEUTROABS 5.0  --  6.0  --   --   --   --   --   --   HGB 7.8*   < > 7.3* 7.7* 8.3* 7.9* 7.7* 6.6* 7.7*  HCT 21.5*  --  20.8* 23.6* 25.5* 22.8* 23.3* 19.5* 23.4*  MCV 90.3  --  93.7 98.3 101.2* 95.8 97.9 98.5  --   PLT 104*   < > 80* 81* 80* 70* 83* 67*  --    < > = values in this interval not displayed.    Basic Metabolic Panel: Recent Labs  Lab 12/30/23 1049 12/30/23 1703 12/30/23 2317 12/31/23 0433 12/31/23 1804 01/01/24 0711 01/01/24 1610 01/01/24 1232 01/01/24 1712 01/01/24 2200 01/02/24 0301 01/02/24 0620 01/02/24 1900 01/02/24 2206 01/03/24 0244 01/03/24 0444 01/03/24 0629 01/03/24 0825  NA 128*  --   --  127*  --  131* 133*  --   --   --  135  --   --   --   --  132*  --   --   K 6.5* 4.5   < > 6.0*   < > 5.7* 4.7  4.9   < > 5.0   < > 5.1   < > 4.6 4.5 4.3 4.3 4.2 4.2  CL 107  --   --  106  --  107 106  --   --   --  108  --   --   --   --  104  --   --   CO2 14*  --   --  12*  --  17* 14*  --   --   --   18*  --   --   --   --  19*  --   --   GLUCOSE 97  --   --  108*  --  96 88  --   --   --  141*  --   --   --   --  218*  --   --   BUN 57*  --   --  58*  --  65* 64*  --   --   --  65*  --   --   --   --  63*  --   --  CREATININE 4.82*  --   --  5.10*  --  6.40* 6.22*  --   --   --  6.26*  --   --   --   --  6.26*  --   --   CALCIUM  8.0*  --   --  8.3*  --  8.0* 8.2*  --   --   --  8.1*  --   --   --   --  7.2*  --   --   MG 2.1  --   --   --   --   --   --   --  2.2  --  2.3  --  2.3  --   --  2.2  --   --   PHOS  --  5.0*  --   --   --   --   --   --  6.7*  --  7.1*  --  7.1*  --   --  6.1*  --   --    < > = values in this interval not displayed.   GFR: Estimated Creatinine Clearance: 10.2 mL/min (A) (by C-G formula based on SCr of 6.26 mg/dL (H)). Recent Labs  Lab 12/30/23 1248 12/30/23 1703 12/30/23 1921 12/31/23 0433 01/01/24 0952 01/02/24 0301 01/03/24 0444  WBC  --   --  10.7* 12.0* 8.6 11.4* 10.1  LATICACIDVEN 4.2* 6.3* 7.5* 6.5*  --   --   --     Liver Function Tests: Recent Labs  Lab 12/30/23 1049 12/31/23 0433 01/01/24 0952 01/02/24 0301 01/03/24 0444  AST 307* 367* 490* 417* 267*  ALT 124* 155* 252* 227* 163*  ALKPHOS 89 81 72 69 63  BILITOT 3.3* 5.2* 5.1* 5.9* 4.5*  PROT 8.7* 9.2* 8.0 8.6* 7.6  ALBUMIN  <1.5* 1.9* 1.8* 1.9* 1.7*   Recent Labs  Lab 12/30/23 1049  LIPASE 49   Recent Labs  Lab 12/30/23 1703 01/01/24 1232 01/03/24 0444  AMMONIA 83* 55* 96*    ABG    Component Value Date/Time   PHART 7.401 06/30/2017 1055   PCO2ART 33.4 06/30/2017 1055   PO2ART 58.3 (L) 06/30/2017 1055   HCO3 20.3 06/30/2017 1055   TCO2 18 (L) 06/27/2017 0510   ACIDBASEDEF 3.7 (H) 06/30/2017 1055   O2SAT 89.3 06/30/2017 1055     Coagulation Profile: Recent Labs  Lab 12/30/23 0817 12/30/23 1921 01/02/24 0301  INR 2.8* 2.7* 3.4*    Cardiac Enzymes: No results for input(s): "CKTOTAL", "CKMB", "CKMBINDEX", "TROPONINI" in the last 168 hours.  HbA1C: Hgb  A1c MFr Bld  Date/Time Value Ref Range Status  12/30/2023 05:03 PM 4.7 (L) 4.8 - 5.6 % Final    Comment:    (NOTE) Pre diabetes:          5.7%-6.4%  Diabetes:              >6.4%  Glycemic control for   <7.0% adults with diabetes   11/12/2017 05:01 PM 12.0 (H) 4.8 - 5.6 % Final    Comment:    (NOTE) Pre diabetes:          5.7%-6.4% Diabetes:              >6.4% Glycemic control for   <7.0% adults with diabetes     CBG: Recent Labs  Lab 01/03/24 1126 01/03/24 1505 01/03/24 1945 01/03/24 2331 01/04/24 0318  GLUCAP 154* 131* 161* 173* 173*      Racheal Buddle, MD, PhD  01/04/2024, 7:26 AM [facility] [phone] or if no answer before 7:00PM call [phone] For any issues after 7:00PM please call eLink [phone]

## 2024-01-05 DIAGNOSIS — R6521 Severe sepsis with septic shock: Secondary | ICD-10-CM | POA: Diagnosis not present

## 2024-01-05 DIAGNOSIS — N179 Acute kidney failure, unspecified: Secondary | ICD-10-CM | POA: Diagnosis not present

## 2024-01-05 DIAGNOSIS — A419 Sepsis, unspecified organism: Secondary | ICD-10-CM | POA: Diagnosis not present

## 2024-01-05 DIAGNOSIS — N170 Acute kidney failure with tubular necrosis: Secondary | ICD-10-CM | POA: Diagnosis not present

## 2024-01-05 LAB — GLUCOSE, CAPILLARY
Glucose-Capillary: 189 mg/dL — ABNORMAL HIGH (ref 70–99)
Glucose-Capillary: 183 mg/dL — ABNORMAL HIGH (ref 70–99)
Glucose-Capillary: 197 mg/dL — ABNORMAL HIGH (ref 70–99)
Glucose-Capillary: 201 mg/dL — ABNORMAL HIGH (ref 70–99)
Glucose-Capillary: 196 mg/dL — ABNORMAL HIGH (ref 70–99)

## 2024-01-05 LAB — CBC
HCT: 21.9 % — ABNORMAL LOW (ref 36.0–46.0)
Hemoglobin: 7.4 g/dL — ABNORMAL LOW (ref 12.0–15.0)
MCH: 32.6 pg (ref 26.0–34.0)
MCHC: 33.8 g/dL (ref 30.0–36.0)
MCV: 96.5 fL (ref 80.0–100.0)
Platelets: 55 10*3/uL — ABNORMAL LOW (ref 150–400)
RBC: 2.27 MIL/uL — ABNORMAL LOW (ref 3.87–5.11)
RDW: 17.1 % — ABNORMAL HIGH (ref 11.5–15.5)
WBC: 11.2 10*3/uL — ABNORMAL HIGH (ref 4.0–10.5)
nRBC: 0.3 % — ABNORMAL HIGH (ref 0.0–0.2)

## 2024-01-05 LAB — COMPREHENSIVE METABOLIC PANEL WITH GFR
ALT: 105 U/L — ABNORMAL HIGH (ref 0–44)
AST: 181 U/L — ABNORMAL HIGH (ref 15–41)
Albumin: 1.7 g/dL — ABNORMAL LOW (ref 3.5–5.0)
Alkaline Phosphatase: 75 U/L (ref 38–126)
Anion gap: 8 (ref 5–15)
BUN: 74 mg/dL — ABNORMAL HIGH (ref 8–23)
CO2: 19 mmol/L — ABNORMAL LOW (ref 22–32)
Calcium: 7.8 mg/dL — ABNORMAL LOW (ref 8.9–10.3)
Chloride: 115 mmol/L — ABNORMAL HIGH (ref 98–111)
Creatinine, Ser: 5.52 mg/dL — ABNORMAL HIGH (ref 0.44–1.00)
GFR, Estimated: 8 mL/min — ABNORMAL LOW (ref >60)
Glucose, Bld: 200 mg/dL — ABNORMAL HIGH (ref 70–99)
Potassium: 4.1 mmol/L (ref 3.5–5.1)
Sodium: 142 mmol/L (ref 135–145)
Total Bilirubin: 3.9 mg/dL — ABNORMAL HIGH (ref 0.0–1.2)
Total Protein: 7.5 g/dL (ref 6.5–8.1)

## 2024-01-05 LAB — PHOSPHORUS: Phosphorus: 4.6 mg/dL (ref 2.5–4.6)

## 2024-01-05 NOTE — Progress Notes (Signed)
 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 Noted to have recurrence of ascites despite 3L para 4/16. She was started on abx, given IVF and ultimately started on 2mcg NE.  Add'l labs w  LA 4.2, Albumin  <1.5 and repeat CBC w plt drop to 80 from 104 a few hours earlier repeat hgb 7.3 from 7.8  PCCM called for admission in this setting    Pertinent  Medical History  Metastatic hepatocellular carcinoma AI hepatitis Hepatic cirrhosis w ascites Abnormal uterine bleeding   Significant Hospital Events: Including procedures, antibiotic start and stop dates in addition to other pertinent events   4/21 admitted. Broad abx IVF pressors TLS labs. 1 PRBC 1 FFP. Zosyn .  Rasburicase    4/22 worse MSOF. Rec consideration of comfort focussed care-- supportive care while family discusses  4/24 back on levo  Interim History / Subjective:  Remains off pressors Serum creatinine 5.52 < 5.83 <6.26 Urine output improving, 850 cc / 24 hours  Objective   Blood pressure 128/60, pulse 90, temperature 97.7 F (36.5 C), temperature source Oral, resp. rate 10, height 5\' 4"  (1.626 m), weight 103.9 kg, SpO2 100%.        Intake/Output Summary (Last 24 hours) at 01/05/2024 0724 Last data filed at 01/05/2024 0655 Gross per 24 hour  Intake 1741.43 ml  Output 1450 ml  Net  291.43 ml   Filed Weights   01/03/24 0500 01/04/24 0500 01/05/24 0500  Weight: 103.9 kg 103.9 kg 103.9 kg    Examination:  General: Obese woman, more comfortable today HEENT: Stronger voice, NG tube in place, no longer coughing Neuro: Awake, interacts, answers questions, follows commands.  Significantly improved compared with 4/26 CV: Distant, no murmur PULM: No wheezing, no crackles, decreased bibasilar breath sounds GI: Obese, nondistended with positive bowel sounds Extremities: Trace lower extremity edema Skin: No rash  Resolved Hospital Problem list     Assessment & Plan:    Acute encephalopathy  Hepatic encephalopathy  Advanced stage hepatocellular carcinoma, mets to R adrenal gland (on immunotherapy) Autoimmune hepatitis Hepatic cirrhosis w recurrent ascites Portal hypertension Hyperbilirubinemia Hyperammonemia  Plan: -LFTs improving, following intermittent CMP -Follow INR - Completed several days vitamin K  - Aldactone and Lasix remain on hold in the setting of her renal failure, hypotension.  Consider adding back and as renal function improves - Volume resuscitation with albumin  if needed based on hemodynamics - Limit any sedating medications  Coagulopathy Anemia Thrombocytopenia Plan: -Following intermittent CBC, remained stable.  No evidence of active bleeding - Empiric PPI twice daily - Completed vitamin K  as above  Septic shock: possible intra-abdominal 2/2 to diverticulitis/possible abscess Plan: -Norepinephrine  weaned to off - Volume resuscitation if needed - Day 6 zosyn  on 4/27 - follow culture data  AKI on CKD 3b, suspect ATN.  Appears to be slowly improving Metabolic  acidosis  Hyperkalemia Hyperphosphatemia Hyponatremia  Plan: -Serum creatinine plateaued and urine output slowly improving - Continue to follow BMP, urine output - Follow electrolytes and replete as indicated - Avoid nephrotoxins, ensure adequate renal perfusion  Elevated  uric acid  Elevated LDH Possible TLS  Plan: -Follow intermittent uric acid, LDH  Descending and sigmoid diverticulosis, possible acute diverticulitis Possible small peridiverticular abscess vs fluid filled diverticulum  Cholecystolithiasis  Plan: -Following, not a surgical candidate  DM Plan: -Continue sliding scale insulin   Hypothyroidism  Plan: -Continue Synthroid   HTN Plan: -Home antihypertensive medications remain on hold  Disposition Plan: - Transition to stepdown on 4/27, to TRH on 4/28   Best Practice (right click and "Reselect all SmartList Selections" daily)   Diet/type: tubefeeds DVT prophylaxis SCD Pressure ulcer(s): N/A GI prophylaxis: N/A Lines: N/A Foley:  Yes, and it is still needed Code Status:  DNR Last date of multidisciplinary goals of care discussion [4/23 Dr. Marygrace Snellen spoke with family and decision made to be DNR. 4/24 updated family at bedside and want to continue current care but family agrees if patient worsens would likely want to transition to comfort care at that time. 4/25 updated husband and children at bedside.]  Labs   CBC: Recent Labs  Lab 12/30/23 0817 12/30/23 1049 12/30/23 1921 01/01/24 0952 01/02/24 0301 01/03/24 0444 01/03/24 1451 01/04/24 0634 01/05/24 0559  WBC 8.9 9.7   < > 8.6 11.4* 10.1  --  10.9* 11.2*  NEUTROABS 5.0 6.0  --   --   --   --   --   --   --   HGB 7.8* 7.3*   < > 7.9* 7.7* 6.6* 7.7* 7.9* 7.4*  HCT 21.5* 20.8*   < > 22.8* 23.3* 19.5* 23.4* 23.9* 21.9*  MCV 90.3 93.7   < > 95.8 97.9 98.5  --  97.6 96.5  PLT 104* 80*   < > 70* 83* 67*  --  58* 55*   < > = values in this interval not displayed.    Basic Metabolic Panel: Recent Labs  Lab 12/30/23 1049 12/30/23 1703 01/01/24 0952 01/01/24 1232 01/01/24 1712 01/01/24 2200 01/02/24 0301 01/02/24 0620 01/02/24 1900 01/02/24 2206 01/03/24 0444 01/03/24 0629 01/03/24 0825 01/04/24 0634 01/05/24 0559  NA 128*   < > 133*  --   --   --  135  --    --   --  132*  --   --  142 142  K 6.5*   < > 4.7  4.9   < > 5.0   < > 5.1   < > 4.6   < > 4.3 4.2 4.2 4.1 4.1  CL 107   < > 106  --   --   --  108  --   --   --  104  --   --  112* 115*  CO2 14*   < > 14*  --   --   --  18*  --   --   --  19*  --   --  19* 19*  GLUCOSE 97   < > 88  --   --   --  141*  --   --   --  218*  --   --  163* 200*  BUN 57*   < > 64*  --   --   --  65*  --   --   --  63*  --   --  68*  74*  CREATININE 4.82*   < > 6.22*  --   --   --  6.26*  --   --   --  6.26*  --   --  5.83* 5.52*  CALCIUM  8.0*   < > 8.2*  --   --   --  8.1*  --   --   --  7.2*  --   --  8.0* 7.8*  MG 2.1  --   --   --  2.2  --  2.3  --  2.3  --  2.2  --   --   --   --   PHOS  --    < >  --   --  6.7*  --  7.1*  --  7.1*  --  6.1*  --   --  5.3* 4.6   < > = values in this interval not displayed.   GFR: Estimated Creatinine Clearance: 11.6 mL/min (A) (by C-G formula based on SCr of 5.52 mg/dL (H)). Recent Labs  Lab 12/30/23 1248 12/30/23 1703 12/30/23 1921 12/31/23 0433 01/01/24 0952 01/02/24 0301 01/03/24 0444 01/04/24 0634 01/05/24 0559  WBC  --   --  10.7* 12.0*   < > 11.4* 10.1 10.9* 11.2*  LATICACIDVEN 4.2* 6.3* 7.5* 6.5*  --   --   --   --   --    < > = values in this interval not displayed.    Liver Function Tests: Recent Labs  Lab 01/01/24 0952 01/02/24 0301 01/03/24 0444 01/04/24 0634 01/05/24 0559  AST 490* 417* 267* 208* 181*  ALT 252* 227* 163* 126* 105*  ALKPHOS 72 69 63 74 75  BILITOT 5.1* 5.9* 4.5* 4.4* 3.9*  PROT 8.0 8.6* 7.6 7.9 7.5  ALBUMIN  1.8* 1.9* 1.7* 2.2* 1.7*   Recent Labs  Lab 12/30/23 1049  LIPASE 49   Recent Labs  Lab 12/30/23 1703 01/01/24 1232 01/03/24 0444 01/04/24 1023  AMMONIA 83* 55* 96* 35    ABG    Component Value Date/Time   PHART 7.401 06/30/2017 1055   PCO2ART 33.4 06/30/2017 1055   PO2ART 58.3 (L) 06/30/2017 1055   HCO3 20.3 06/30/2017 1055   TCO2 18 (L) 06/27/2017 0510   ACIDBASEDEF 3.7 (H) 06/30/2017 1055   O2SAT  89.3 06/30/2017 1055     Coagulation Profile: Recent Labs  Lab 12/30/23 0817 12/30/23 1921 01/02/24 0301 01/04/24 0634  INR 2.8* 2.7* 3.4* 4.0*    Cardiac Enzymes: No results for input(s): "CKTOTAL", "CKMB", "CKMBINDEX", "TROPONINI" in the last 168 hours.  HbA1C: Hgb A1c MFr Bld  Date/Time Value Ref Range Status  12/30/2023 05:03 PM 4.7 (L) 4.8 - 5.6 % Final    Comment:    (NOTE) Pre diabetes:          5.7%-6.4%  Diabetes:              >6.4%  Glycemic control for   <7.0% adults with diabetes   11/12/2017 05:01 PM 12.0 (H) 4.8 - 5.6 % Final    Comment:    (NOTE) Pre diabetes:          5.7%-6.4% Diabetes:              >6.4% Glycemic control for   <7.0% adults with diabetes     CBG: Recent Labs  Lab 01/04/24 0751 01/04/24 1054 01/04/24 1622 01/04/24 1954 01/05/24 0402  GLUCAP 161* 161* 152* 162* 189*      Racheal Buddle, MD, PhD 01/05/2024,  7:24 AM [facility] [phone] or if no answer before 7:00PM call [phone] For any issues after 7:00PM please call eLink [phone]

## 2024-01-05 NOTE — Progress Notes (Signed)
 Family requested nursing staff due to patient vomiting. Upon arrival patient was still vomiting and bed had been lowered to 20 degrees by patient for comfort. Tube feed was paused, emesis bag was provided, and bed raised to 45 degrees. Zofran  was given and patient was cleaned up and complete linen set was changed. While speaking to the family, son admitted to giving patient a cup of ice chips while patient was laying down on her side. I explained that this could have precipitated her nausea due to laying down flat while getting continuous tube feeds. And that she is not supposed to be getting ice. Family stated that they didn't know and it wont happen again. MD made aware.    Lelon Putty

## 2024-01-06 ENCOUNTER — Encounter (HOSPITAL_COMMUNITY): Payer: Self-pay

## 2024-01-06 DIAGNOSIS — N179 Acute kidney failure, unspecified: Secondary | ICD-10-CM | POA: Diagnosis not present

## 2024-01-06 DIAGNOSIS — D649 Anemia, unspecified: Secondary | ICD-10-CM

## 2024-01-06 DIAGNOSIS — R579 Shock, unspecified: Secondary | ICD-10-CM | POA: Diagnosis not present

## 2024-01-06 DIAGNOSIS — E875 Hyperkalemia: Secondary | ICD-10-CM | POA: Diagnosis not present

## 2024-01-06 LAB — GLUCOSE, CAPILLARY
Glucose-Capillary: 172 mg/dL — ABNORMAL HIGH (ref 70–99)
Glucose-Capillary: 203 mg/dL — ABNORMAL HIGH (ref 70–99)
Glucose-Capillary: 270 mg/dL — ABNORMAL HIGH (ref 70–99)
Glucose-Capillary: 264 mg/dL — ABNORMAL HIGH (ref 70–99)

## 2024-01-06 LAB — TYPE AND SCREEN
ABO/RH(D): A POS
Antibody Screen: NEGATIVE
Unit division: 0

## 2024-01-06 LAB — BPAM RBC
Blood Product Expiration Date: 202505272359
ISSUE DATE / TIME: 202504251005
Unit Type and Rh: 6200

## 2024-01-06 MED ORDER — GUAIFENESIN-DM 100-10 MG/5ML PO SYRP
10.0000 mL | ORAL_SOLUTION | Freq: Four times a day (QID) | ORAL | Status: DC
  Administered 2024-01-07 – 2024-01-11 (×19): 10 mL
  Filled 2024-01-06 (×19): qty 10

## 2024-01-06 MED ORDER — FREE WATER
200.0000 mL | Freq: Three times a day (TID) | Status: AC
  Administered 2024-01-06 (×2): 200 mL

## 2024-01-06 MED ORDER — ORAL CARE MOUTH RINSE: 15.0000 mL | OROMUCOSAL | Status: DC | PRN

## 2024-01-06 MED ORDER — ORAL CARE MOUTH RINSE
15.0000 mL | OROMUCOSAL | Status: DC
  Administered 2024-01-06 – 2024-01-10 (×16): 15 mL via OROMUCOSAL

## 2024-01-06 NOTE — Progress Notes (Signed)
 [facility] PROGRESS NOTE    Progress Note  [name]  XBJ:[id number] DOB: Feb 02, 1956 DOA: 12/30/2023 PCP: Elester Grim, MD     Brief Narrative:   Dana Thornton is an 68 y.o. 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 bili of 3.6 hemoglobin of 7.8 PT/INR 104/2.8.  In the ED BP was soft, was also noted to have significant ascites despite 3 L paracentesis on 12/25/2023.  Empirically on antibiotics and started on pressors   Assessment/Plan:   Acute hepatic encephalopathy/autoimmune hepatitis/hepatic cirrhosis with recurrent ascites: LFTs and T. bili have improved she completed several days of vitamin K . Aldactone and Lasix were held on admission due to acute kidney injury and hypotension. Can consider restarting them once renal function continues to improve She was volume resuscitated with IV albumin  volume and pressors. Currently lactulose  3 times a day. She is confused this morning, as per her son she was able to carry out a conversation yesterday.  Coagulopathy/anemia/thrombocytopenia: No evidence of active bleeding, on empirically Protonix  twice a day. She completed course of vitamin K . Platelets continue to slowly drift down  Septic shock secondary to acute diverticulitis with possible diverticular abscess: CT scan showed acute diverticulitis with a small fluid collection in the sigmoid colon measuring 2.2 cm. Was temporarily on norepinephrine  which has now been weaned off. She was volume resuscitated and start empirically on IV Zosyn . Blood cultures remain negative to date.  ATN on chronic kidney disease stage III b/metabolic acidosis/hyperkalemia/hyperphosphatemia and  hyponatremia: With baseline creatinine around 1.9-2.2. ATN in the setting of septic shock and diuretic use. Creatinine peaked 2.6. Was treated with IV fluids IV albumin  and pressors, creatinine has plateaued and is slowly trending down.  Elevated uric acid/elevated pH: Uric acid is now less than 1.5. LDH is slowly improving.  Diabetes mellitus type 2: On sliding scale insulin  blood glucose fairly controlled.  Hypothyroidism: Continue Synthroid .  Essential hypertension: On antihypertensive medication been held due to septic shock.  Nutrition: Currently on tube feeds. Currently has a rectal tube.  Stage IV hepatocellular carcinoma: Oncology was consulted on 01/01/2024 and discussed with the patient the clinical picture and worrisome deterioration. She was made DNR/DNI on 12/12/2023. Oncology recommended comfort measures due to poor prognosis and multiorgan failure in the setting of septic shock with stage IV parasellar carcinoma. The husband related that wanted to see how she does before moving to comfort measures.  DVT prophylaxis: SCD Family Communication:son and father Status is: Inpatient Remains inpatient appropriate because: Acute diverticulitis with septic shock    Code Status:     Code Status Orders  (From admission, onward)           Start     Ordered   01/01/24 0912  Do not attempt resuscitation (DNR)- Limited -Do Not Intubate (DNI)  (Code Status)  Continuous       Question Answer Comment  If pulseless and not breathing No CPR or chest compressions.   In Pre-Arrest Conditions (Patient Is Breathing and Has A Pulse) Do not intubate. Provide all appropriate non-invasive medical interventions. Avoid ICU transfer unless indicated or required.   Consent: Discussion documented in EHR or advanced directives reviewed      01/01/24 0911  Code Status History     Date Active Date Inactive Code Status Order ID Comments User Context   12/30/2023 1621  01/01/2024 0911 Full Code [id number]  Delories Fetter, NP ED   10/10/2023 1942 10/15/2023 1540 Full Code [id number]  Kenny Peals, MD ED   11/12/2017 1638 11/17/2017 1853 Full Code [id number]  Adrienne Horning, MD ED   06/27/2017 1007 07/03/2017 1735 Full Code [id number]  Carmelo Chock, NP ED   04/09/2015 2033 04/13/2015 1554 Full Code [id number]  Maylene Spear, MD Inpatient   03/29/2015 0300 04/01/2015 1411 Full Code [id number]  Chucky Craver, MD Inpatient   12/03/2011 0340 12/10/2011 1759 Full Code [id number]  Deterding, Leitha Purl, MD Inpatient   12/03/2011 0234 12/03/2011 0340 Full Code [id number]  Lewis Red, MD ED         IV Access:   Peripheral IV   Procedures and diagnostic studies:   No results found.   Medical Consultants:   None.   Subjective:    VERNIQUE Thornton is grunting  Objective:    Vitals:   01/05/24 1500 01/05/24 1600 01/05/24 1742 01/05/24 1900  BP: 121/70 135/69  (!) 120/51  Pulse: 86 87  78  Resp: 11 10  12   Temp:   (!) 97.5 F (36.4 C)   TempSrc:   Axillary   SpO2: 100% 100%  98%  Weight:      Height:       SpO2: 98 %   Intake/Output Summary (Last 24 hours) at 01/06/2024 0535 Last data filed at 01/05/2024 1800 Gross per 24 hour  Intake 1515.89 ml  Output 1150 ml  Net 365.89 ml   Filed Weights   01/03/24 0500 01/04/24 0500 01/05/24 0500  Weight: 103.9 kg 103.9 kg 103.9 kg    Exam: General exam: In no acute distress. Respiratory system: Good air movement and clear to auscultation. Cardiovascular system: S1 & S2 heard, RRR. No JVD. Gastrointestinal system: Abdomen is nondistended, soft and nontender.  Central nervous system: Lethargic Extremities: No pedal edema. Skin: No rashes, lesions or ulcers Psychiatry: No insight to medical condition   Data Reviewed:    Labs: Basic Metabolic Panel: Recent Labs  Lab 12/30/23 1049 12/30/23 1703 01/01/24 0952 01/01/24 1232 01/01/24 1712 01/01/24 2200 01/02/24 0301  01/02/24 0620 01/02/24 1900 01/02/24 2206 01/03/24 0444 01/03/24 0629 01/04/24 0634 01/05/24 0559  NA 128*   < > 133*  --   --   --  135  --   --   --  132*  --  142 142  K 6.5*   < > 4.7  4.9   < > 5.0   < > 5.1   < > 4.6   < > 4.3   < > 4.1 4.1  CL 107   < > 106  --   --   --  108  --   --   --  104  --  112* 115*  CO2 14*   < > 14*  --   --   --  18*  --   --   --  19*  --  19* 19*  GLUCOSE 97   < > 88  --   --   --  141*  --   --   --  218*  --  163* 200*  BUN 57*   < > 64*  --   --   --  65*  --   --   --  63*  --  68* 74*  CREATININE 4.82*   < > 6.22*  --   --   --  6.26*  --   --   --  6.26*  --  5.83* 5.52*  CALCIUM  8.0*   < > 8.2*  --   --   --  8.1*  --   --   --  7.2*  --  8.0* 7.8*  MG 2.1  --   --   --  2.2  --  2.3  --  2.3  --  2.2  --   --   --   PHOS  --    < >  --   --  6.7*  --  7.1*  --  7.1*  --  6.1*  --  5.3* 4.6   < > = values in this interval not displayed.   GFR Estimated Creatinine Clearance: 11.6 mL/min (A) (by C-G formula based on SCr of 5.52 mg/dL (H)). Liver Function Tests: Recent Labs  Lab 01/01/24 0952 01/02/24 0301 01/03/24 0444 01/04/24 0634 01/05/24 0559  AST 490* 417* 267* 208* 181*  ALT 252* 227* 163* 126* 105*  ALKPHOS 72 69 63 74 75  BILITOT 5.1* 5.9* 4.5* 4.4* 3.9*  PROT 8.0 8.6* 7.6 7.9 7.5  ALBUMIN  1.8* 1.9* 1.7* 2.2* 1.7*   Recent Labs  Lab 12/30/23 1049  LIPASE 49   Recent Labs  Lab 12/30/23 1703 01/01/24 1232 01/03/24 0444 01/04/24 1023  AMMONIA 83* 55* 96* 35   Coagulation profile Recent Labs  Lab 12/30/23 0817 12/30/23 1921 01/02/24 0301 01/04/24 0634  INR 2.8* 2.7* 3.4* 4.0*   COVID-19 Labs  No results for input(s): "DDIMER", "FERRITIN", "LDH", "CRP" in the last 72 hours.  Lab Results  Component Value Date   SARSCOV2NAA NEGATIVE 12/30/2023    CBC: Recent Labs  Lab 12/30/23 0817 12/30/23 1049 12/30/23 1921 01/01/24 0952 01/02/24 0301 01/03/24 0444 01/03/24 1451 01/04/24 0634 01/05/24 0559   WBC 8.9 9.7   < > 8.6 11.4* 10.1  --  10.9* 11.2*  NEUTROABS 5.0 6.0  --   --   --   --   --   --   --   HGB 7.8* 7.3*   < > 7.9* 7.7* 6.6* 7.7* 7.9* 7.4*  HCT 21.5* 20.8*   < > 22.8* 23.3* 19.5* 23.4* 23.9* 21.9*  MCV 90.3 93.7   < > 95.8 97.9 98.5  --  97.6 96.5  PLT 104* 80*   < > 70* 83* 67*  --  58* 55*   < > = values in this interval not displayed.   Cardiac Enzymes: No results for input(s): "CKTOTAL", "CKMB", "CKMBINDEX", "TROPONINI" in the last 168 hours. BNP (last 3 results) No results for input(s): "PROBNP" in the last 8760 hours. CBG: Recent Labs  Lab 01/05/24 0402 01/05/24 0755 01/05/24 1149 01/05/24 1618 01/05/24 1954  GLUCAP 189* 183* 197* 201* 196*   D-Dimer: No results for input(s): "DDIMER" in the last 72 hours. Hgb A1c: No results for input(s): "HGBA1C" in the last 72 hours. Lipid Profile: No results for input(s): "CHOL", "HDL", "LDLCALC", "TRIG", "CHOLHDL", "LDLDIRECT" in the last 72 hours. Thyroid function studies: No results for input(s): "TSH", "T4TOTAL", "T3FREE", "THYROIDAB" in the last 72 hours.  Invalid input(s): "FREET3" Anemia work up: No results for input(s): "VITAMINB12", "FOLATE", "FERRITIN", "TIBC", "IRON", "RETICCTPCT" in the last 72 hours. Sepsis Labs: Recent Labs  Lab 12/30/23 1248 12/30/23 1703 12/30/23 1921 12/31/23 0433 01/01/24 9604 01/02/24  0301 01/03/24 0444 01/04/24 0634 01/05/24 0559  WBC  --   --  10.7* 12.0*   < > 11.4* 10.1 10.9* 11.2*  LATICACIDVEN 4.2* 6.3* 7.5* 6.5*  --   --   --   --   --    < > = values in this interval not displayed.   Microbiology Recent Results (from the past 240 hours)  Blood Culture (routine x 2)     Status: None   Collection Time: 12/30/23 12:21 PM   Specimen: BLOOD  Result Value Ref Range Status   Specimen Description   Final    BLOOD BLOOD RIGHT HAND Performed at [facility], [address], [place], [place] [address]    Special Requests   Final    BOTTLES  DRAWN AEROBIC ONLY Blood Culture results may not be optimal due to an inadequate volume of blood received in culture bottles Performed at [facility], [address] [place], [place] [address]    Culture   Final    NO GROWTH 5 DAYS Performed at [facility], [address], [place], [place] [address]    Report Status 01/04/2024 FINAL  Final  Blood Culture (routine x 2)     Status: None   Collection Time: 12/30/23 12:35 PM   Specimen: BLOOD  Result Value Ref Range Status   Specimen Description   Final    BLOOD LEFT ANTECUBITAL Performed at [facility], [address] [place], [place] [address]    Special Requests   Final    BOTTLES DRAWN AEROBIC AND ANAEROBIC Blood Culture adequate volume Performed at [facility], [address], [place], [place] [address]    Culture   Final    NO GROWTH 5 DAYS Performed at [facility], [address] [place], [place] [address]    Report Status 01/04/2024 FINAL  Final  Resp panel by RT-PCR (RSV, Flu A&B, Covid) Anterior Nasal Swab     Status: None   Collection Time: 12/30/23  3:01 PM   Specimen: Anterior Nasal Swab  Result Value Ref Range Status   SARS Coronavirus 2 by RT PCR NEGATIVE NEGATIVE Final    Comment: (NOTE) SARS-CoV-2 target nucleic acids are NOT DETECTED.  The SARS-CoV-2 RNA is generally detectable in upper respiratory specimens during the acute phase of infection. The lowest concentration of SARS-CoV-2 viral copies this assay can detect is 138 copies/mL. A negative result does not preclude SARS-Cov-2 infection and should not be used as the sole basis for treatment or other patient management decisions. A negative result may occur with  improper specimen collection/handling, submission of specimen other than nasopharyngeal swab, presence of viral mutation(s) within the areas targeted by this assay, and inadequate number of viral copies(<138 copies/mL). A  negative result must be combined with clinical observations, patient history, and epidemiological information. The expected result is Negative.  Fact Sheet for Patients:  BloggerCourse.com  Fact Sheet for Healthcare Providers:  SeriousBroker.it  This test is no t yet approved or cleared by the [place]  FDA and  has been authorized for detection and/or diagnosis of SARS-CoV-2 by FDA under an Emergency Use Authorization (EUA). This EUA will remain  in effect (meaning this test can be used) for the duration of the COVID-19 declaration under Section 564(b)(1) of the Act, 21 U.S.C.section 360bbb-3(b)(1), unless the authorization is terminated  or revoked sooner.       Influenza A by PCR NEGATIVE NEGATIVE  Final   Influenza B by PCR NEGATIVE NEGATIVE Final    Comment: (NOTE) The Xpert Xpress SARS-CoV-2/FLU/RSV plus assay is intended as an aid in the diagnosis of influenza from Nasopharyngeal swab specimens and should not be used as a sole basis for treatment. Nasal washings and aspirates are unacceptable for Xpert Xpress SARS-CoV-2/FLU/RSV testing.  Fact Sheet for Patients: BloggerCourse.com  Fact Sheet for Healthcare Providers: SeriousBroker.it  This test is not yet approved or cleared by the [place]  FDA and has been authorized for detection and/or diagnosis of SARS-CoV-2 by FDA under an Emergency Use Authorization (EUA). This EUA will remain in effect (meaning this test can be used) for the duration of the COVID-19 declaration under Section 564(b)(1) of the Act, 21 U.S.C. section 360bbb-3(b)(1), unless the authorization is terminated or revoked.     Resp Syncytial Virus by PCR NEGATIVE NEGATIVE Final    Comment: (NOTE) Fact Sheet for Patients: BloggerCourse.com  Fact Sheet for Healthcare  Providers: SeriousBroker.it  This test is not yet approved or cleared by the [place]  FDA and has been authorized for detection and/or diagnosis of SARS-CoV-2 by FDA under an Emergency Use Authorization (EUA). This EUA will remain in effect (meaning this test can be used) for the duration of the COVID-19 declaration under Section 564(b)(1) of the Act, 21 U.S.C. section 360bbb-3(b)(1), unless the authorization is terminated or revoked.  Performed at [facility], [address], [place], [place] [address]   MRSA Next Gen by PCR, Nasal     Status: None   Collection Time: 12/30/23  5:16 PM   Specimen: Nasal Mucosa; Nasal Swab  Result Value Ref Range Status   MRSA by PCR Next Gen NOT DETECTED NOT DETECTED Final    Comment: (NOTE) The GeneXpert MRSA Assay (FDA approved for NASAL specimens only), is one component of a comprehensive MRSA colonization surveillance program. It is not intended to diagnose MRSA infection nor to guide or monitor treatment for MRSA infections. Test performance is not FDA approved in patients less than 57 years old. Performed at [facility], [address], [place], [place] [address]      Medications:    Chlorhexidine  Gluconate Cloth  6 each Topical Daily   dextromethorphan   30 mg Per Tube BID   feeding supplement (PROSource TF20)  60 mL Per Tube Daily   guaiFENesin   10 mL Per Tube Q6H   insulin  aspart  0-9 Units Subcutaneous Q4H   lactulose   20 g Per Tube TID   levothyroxine   150 mcg Per Tube Once per day on Sunday Saturday   levothyroxine   300 mcg Per Tube Once per day on Monday Tuesday Wednesday Thursday Friday   midodrine   10 mg Per Tube TID WC   pantoprazole  (PROTONIX ) IV  40 mg Intravenous Q12H   sodium chloride  flush  10-40 mL Intracatheter Q12H   Continuous Infusions:  feeding supplement (VITAL 1.5 CAL) 60 mL/hr at 01/05/24 1400   piperacillin -tazobactam (ZOSYN )  IV Stopped  (01/06/24 0103)      LOS: 7 days   Macdonald Savoy  [facility]  01/06/2024, 5:35 AM

## 2024-01-07 DIAGNOSIS — D649 Anemia, unspecified: Secondary | ICD-10-CM | POA: Diagnosis not present

## 2024-01-07 DIAGNOSIS — E875 Hyperkalemia: Secondary | ICD-10-CM | POA: Diagnosis not present

## 2024-01-07 DIAGNOSIS — R579 Shock, unspecified: Secondary | ICD-10-CM | POA: Diagnosis not present

## 2024-01-07 DIAGNOSIS — N179 Acute kidney failure, unspecified: Secondary | ICD-10-CM | POA: Diagnosis not present

## 2024-01-07 LAB — GLUCOSE, CAPILLARY
Glucose-Capillary: 116 mg/dL — ABNORMAL HIGH (ref 70–99)
Glucose-Capillary: 132 mg/dL — ABNORMAL HIGH (ref 70–99)
Glucose-Capillary: 133 mg/dL — ABNORMAL HIGH (ref 70–99)
Glucose-Capillary: 146 mg/dL — ABNORMAL HIGH (ref 70–99)
Glucose-Capillary: 169 mg/dL — ABNORMAL HIGH (ref 70–99)
Glucose-Capillary: 204 mg/dL — ABNORMAL HIGH (ref 70–99)
Glucose-Capillary: 224 mg/dL — ABNORMAL HIGH (ref 70–99)

## 2024-01-07 LAB — CBC
HCT: 19.1 % — ABNORMAL LOW (ref 36.0–46.0)
Hemoglobin: 6.2 g/dL — CL (ref 12.0–15.0)
MCH: 32.8 pg (ref 26.0–34.0)
MCHC: 32.5 g/dL (ref 30.0–36.0)
MCV: 101.1 fL — ABNORMAL HIGH (ref 80.0–100.0)
Platelets: 54 10*3/uL — ABNORMAL LOW (ref 150–400)
RBC: 1.89 MIL/uL — ABNORMAL LOW (ref 3.87–5.11)
RDW: 17.8 % — ABNORMAL HIGH (ref 11.5–15.5)
WBC: 16.4 10*3/uL — ABNORMAL HIGH (ref 4.0–10.5)
nRBC: 0.5 % — ABNORMAL HIGH (ref 0.0–0.2)

## 2024-01-07 LAB — COMPREHENSIVE METABOLIC PANEL WITH GFR
ALT: 95 U/L — ABNORMAL HIGH (ref 0–44)
AST: 177 U/L — ABNORMAL HIGH (ref 15–41)
Albumin: 1.5 g/dL — ABNORMAL LOW (ref 3.5–5.0)
Alkaline Phosphatase: 78 U/L (ref 38–126)
Anion gap: 9 (ref 5–15)
BUN: 94 mg/dL — ABNORMAL HIGH (ref 8–23)
CO2: 19 mmol/L — ABNORMAL LOW (ref 22–32)
Calcium: 8.2 mg/dL — ABNORMAL LOW (ref 8.9–10.3)
Chloride: 118 mmol/L — ABNORMAL HIGH (ref 98–111)
Creatinine, Ser: 6.78 mg/dL — ABNORMAL HIGH (ref 0.44–1.00)
GFR, Estimated: 6 mL/min — ABNORMAL LOW (ref >60)
Glucose, Bld: 237 mg/dL — ABNORMAL HIGH (ref 70–99)
Potassium: 4.5 mmol/L (ref 3.5–5.1)
Sodium: 146 mmol/L — ABNORMAL HIGH (ref 135–145)
Total Bilirubin: 4.5 mg/dL — ABNORMAL HIGH (ref 0.0–1.2)
Total Protein: 7.7 g/dL (ref 6.5–8.1)

## 2024-01-07 LAB — HEMOGLOBIN AND HEMATOCRIT, BLOOD
HCT: 22.9 % — ABNORMAL LOW (ref 36.0–46.0)
Hemoglobin: 7.3 g/dL — ABNORMAL LOW (ref 12.0–15.0)

## 2024-01-07 LAB — PREPARE RBC (CROSSMATCH)

## 2024-01-07 MED ORDER — SODIUM CHLORIDE 0.9% IV SOLUTION: Freq: Once | INTRAVENOUS | Status: AC

## 2024-01-07 MED ORDER — FREE WATER
200.0000 mL | Freq: Three times a day (TID) | Status: DC
  Administered 2024-01-07 – 2024-01-11 (×13): 200 mL

## 2024-01-07 MED ORDER — ALBUMIN HUMAN 5 % IV SOLN
12.5000 g | Freq: Once | INTRAVENOUS | Status: AC
  Administered 2024-01-07: 12.5 g via INTRAVENOUS
  Filled 2024-01-07: qty 250

## 2024-01-07 NOTE — Progress Notes (Signed)
       Overnight   NAME: Dana Thornton MRN: [id number] DOB : [birth year]    Date of Service   01/07/2024   HPI/Events of Note     Latest Reference Range & Units 01/07/24 04:44  WBC 4.0 - 10.5 K/uL 16.4 (H)  RBC 3.87 - 5.11 MIL/uL 1.89 (L)  Hemoglobin 12.0 - 15.0 g/dL 6.2 (LL)  HCT 78.2 - 95.6 % 19.1 (L)  MCV 80.0 - 100.0 fL 101.1 (H)  MCH 26.0 - 34.0 pg 32.8  MCHC 30.0 - 36.0 g/dL 21.3  RDW 08.6 - 57.8 % 17.8 (H)  Platelets 150 - 400 K/uL 54 (L)  nRBC 0.0 - 0.2 % 0.5 (H)  (LL): Data is critically low (H): Data is abnormally high (L): Data is abnormally low  Transfusion is accepted.   Interventions/ Plan    1 unit PRBCs  Post transfusion H&H per timing of RN.      Denece Finger BSN MSNA MSN ACNPC-AG Acute Care Nurse Practitioner [facility]

## 2024-01-07 NOTE — Plan of Care (Signed)
  Problem: Coping: Goal: Ability to adjust to condition or change in health will improve Outcome: Progressing   Problem: Fluid Volume: Goal: Ability to maintain a balanced intake and output will improve Outcome: Progressing   Problem: Health Behavior/Discharge Planning: Goal: Ability to manage health-related needs will improve Outcome: Progressing   Problem: Metabolic: Goal: Ability to maintain appropriate glucose levels will improve Outcome: Progressing   Problem: Skin Integrity: Goal: Risk for impaired skin integrity will decrease Outcome: Progressing   Problem: Tissue Perfusion: Goal: Adequacy of tissue perfusion will improve Outcome: Progressing   Problem: Clinical Measurements: Goal: Ability to maintain clinical measurements within normal limits will improve Outcome: Progressing

## 2024-01-07 NOTE — Progress Notes (Addendum)
 [facility] PROGRESS NOTE    Progress Note  Dana Thornton  ZOX:[id number] DOB: 1956/09/06 DOA: 12/30/2023 PCP: Elester Grim, MD     Brief Narrative:   Dana Thornton is an 68 y.o. 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 bili of 3.6 hemoglobin of 7.8 PT/INR 104/2.8.  In the ED BP was soft, was also noted to have significant ascites despite 3 L paracentesis on 12/25/2023.  Empirically on antibiotics and started on pressors. Assessment/Plan:  Acute hepatic encephalopathy/autoimmune hepatitis/hepatic cirrhosis with recurrent ascites: LFTs and T. bili have worsened this morning Aldactone and Lasix were held on admission due to acute kidney injury and hypotension. She was volume resuscitated with IV albumin  volume and pressors. Currently lactulose  3 times a day. She continues to be slow to respond this morning confused not able to carry on a conversation. She has an extremely poor prognosis I try to explain this to the daughter this morning she was hesitant. She called her dad is coming in this morning will have conversation again this morning with the family about her extremely poor prognosis and how she will eventually pass away in the hospital.  Coagulopathy/normocytic anemia/thrombocytopenia: No evidence of active bleeding, on empirically Protonix  twice a day. She completed course of vitamin K . Platelet this morning lower than yesterday at 54. Significant drop in hemoglobin to 6.2 will go ahead and transfuse 1 units of packed red blood cells. Check hemoglobin post transfusional.  Septic shock secondary to acute diverticulitis with possible diverticular abscess: CT scan showed acute  diverticulitis with a small fluid collection in the sigmoid colon measuring 2.2 cm. Was temporarily on norepinephrine  which has now been weaned off. She was volume resuscitated and started on empiric antibiotics, continue IV Zosyn . She has a worsening leukocytosis this morning. Blood cultures remain negative to date.  Hypernatremia: Increase free water  flushes per tube.  ATN on chronic kidney disease stage III b/metabolic acidosis/hyperkalemia/hyperphosphatemia and hyponatremia: With baseline creatinine around 1.9-2.2. ATN in the setting of septic shock and diuretic use. Creatinine continues to worsen and she is not a candidate for dialysis. White blood cell count is rising.  Elevated uric acid/elevated pH: Uric acid is now less than 1.5. LDH is slowly improving.  Diabetes mellitus type 2: On sliding scale insulin  blood glucose fairly controlled.  Hypothyroidism: Continue IV Synthroid .  Essential hypertension: On antihypertensive medication been held due to septic shock.  Nutrition: Currently on tube feeds. Currently has a rectal tube.  Stage IV hepatocellular carcinoma: Oncology was consulted on 01/01/2024 and discussed with the patient the clinical picture and worrisome deterioration. She was made DNR/DNI on 12/12/2023. Oncology recommended comfort measures due to poor prognosis and multiorgan failure in the setting of septic shock with stage IV pedal cellular carcinoma Family has unrealistic expectations about the outcome, have relates to them that her prognosis is extremely poor and she will probably pass away in the hospital. They want to continue aggressive treatment.  DVT prophylaxis: SCD Family Communication:son and father Status is: Inpatient Remains inpatient appropriate because: Acute diverticulitis with septic shock    Code Status:     Code Status Orders  (From admission, onward)           Start  Ordered   01/01/24 0912  Do not attempt  resuscitation (DNR)- Limited -Do Not Intubate (DNI)  (Code Status)  Continuous       Question Answer Comment  If pulseless and not breathing No CPR or chest compressions.   In Pre-Arrest Conditions (Patient Is Breathing and Has A Pulse) Do not intubate. Provide all appropriate non-invasive medical interventions. Avoid ICU transfer unless indicated or required.   Consent: Discussion documented in EHR or advanced directives reviewed      01/01/24 0911           Code Status History     Date Active Date Inactive Code Status Order ID Comments User Context   12/30/2023 1621 01/01/2024 0911 Full Code [id number]  Delories Fetter, NP ED   10/10/2023 1942 10/15/2023 1540 Full Code [id number]  Kenny Peals, MD ED   11/12/2017 1638 11/17/2017 1853 Full Code [id number]  Adrienne Horning, MD ED   06/27/2017 1007 07/03/2017 1735 Full Code [id number]  Carmelo Chock, NP ED   04/09/2015 2033 04/13/2015 1554 Full Code [id number]  Maylene Spear, MD Inpatient   03/29/2015 0300 04/01/2015 1411 Full Code [id number]  Chucky Craver, MD Inpatient   12/03/2011 0340 12/10/2011 1759 Full Code [id number]  Deterding, Leitha Purl, MD Inpatient   12/03/2011 0234 12/03/2011 0340 Full Code [id number]  Lewis Red, MD ED         IV Access:   Peripheral IV   Procedures and diagnostic studies:   No results found.   Medical Consultants:   None.   Subjective:    HALYNN POLINSKY moaning and groaning.  Objective:    Vitals:   01/07/24 0300 01/07/24 0347 01/07/24 0400 01/07/24 0500  BP: (!) 114/56  (!) 104/59   Pulse: 87  80   Resp: 11  11   Temp:  (!) 96.9 F (36.1 C)    TempSrc:  Axillary    SpO2: 100%  100%   Weight:    104.3 kg  Height:       SpO2: 100 %   Intake/Output Summary (Last 24 hours) at 01/07/2024 0623 Last data filed at 01/07/2024 0155 Gross per 24 hour  Intake 3136.02 ml  Output 1415 ml  Net 1721.02 ml   Filed Weights   01/05/24 0500 01/06/24 0500 01/07/24 0500   Weight: 103.9 kg 103.8 kg 104.3 kg    Exam: General exam: In no acute distress. Respiratory system: Good air movement and clear to auscultation. Cardiovascular system: S1 & S2 heard, RRR. No JVD. Gastrointestinal system: Abdomen is nondistended, soft and nontender.  Central nervous system: Responsive to verbal and outside stimuli Extremities: 3+ edema Skin: No rashes, lesions or ulcers Psychiatry: No judgment or insight in medical condition.  Data Reviewed:    Labs: Basic Metabolic Panel: Recent Labs  Lab 01/01/24 0952 01/01/24 1232 01/01/24 1712 01/01/24 2200 01/02/24 0301 01/02/24 0620 01/02/24 1900 01/02/24 2206 01/03/24 0444 01/03/24 0629 01/04/24 0634 01/05/24 0559  NA 133*  --   --   --  135  --   --   --  132*  --  142 142  K 4.7  4.9   < > 5.0   < > 5.1   < > 4.6   < > 4.3   < > 4.1 4.1  CL 106  --   --   --  108  --   --   --  104  --  112* 115*  CO2 14*  --   --   --  18*  --   --   --  19*  --  19* 19*  GLUCOSE 88  --   --   --  141*  --   --   --  218*  --  163* 200*  BUN 64*  --   --   --  65*  --   --   --  63*  --  68* 74*  CREATININE 6.22*  --   --   --  6.26*  --   --   --  6.26*  --  5.83* 5.52*  CALCIUM  8.2*  --   --   --  8.1*  --   --   --  7.2*  --  8.0* 7.8*  MG  --   --  2.2  --  2.3  --  2.3  --  2.2  --   --   --   PHOS  --    < > 6.7*  --  7.1*  --  7.1*  --  6.1*  --  5.3* 4.6   < > = values in this interval not displayed.   GFR Estimated Creatinine Clearance: 11.6 mL/min (A) (by C-G formula based on SCr of 5.52 mg/dL (H)). Liver Function Tests: Recent Labs  Lab 01/01/24 0952 01/02/24 0301 01/03/24 0444 01/04/24 0634 01/05/24 0559  AST 490* 417* 267* 208* 181*  ALT 252* 227* 163* 126* 105*  ALKPHOS 72 69 63 74 75  BILITOT 5.1* 5.9* 4.5* 4.4* 3.9*  PROT 8.0 8.6* 7.6 7.9 7.5  ALBUMIN  1.8* 1.9* 1.7* 2.2* 1.7*   No results for input(s): "LIPASE", "AMYLASE" in the last 168 hours.  Recent Labs  Lab 01/01/24 1232 01/03/24 0444  01/04/24 1023  AMMONIA 55* 96* 35   Coagulation profile Recent Labs  Lab 01/02/24 0301 01/04/24 0634  INR 3.4* 4.0*   COVID-19 Labs  No results for input(s): "DDIMER", "FERRITIN", "LDH", "CRP" in the last 72 hours.  Lab Results  Component Value Date   SARSCOV2NAA NEGATIVE 12/30/2023    CBC: Recent Labs  Lab 01/02/24 0301 01/03/24 0444 01/03/24 1451 01/04/24 0634 01/05/24 0559 01/07/24 0444  WBC 11.4* 10.1  --  10.9* 11.2* 16.4*  HGB 7.7* 6.6* 7.7* 7.9* 7.4* 6.2*  HCT 23.3* 19.5* 23.4* 23.9* 21.9* 19.1*  MCV 97.9 98.5  --  97.6 96.5 101.1*  PLT 83* 67*  --  58* 55* 54*   Cardiac Enzymes: No results for input(s): "CKTOTAL", "CKMB", "CKMBINDEX", "TROPONINI" in the last 168 hours. BNP (last 3 results) No results for input(s): "PROBNP" in the last 8760 hours. CBG: Recent Labs  Lab 01/06/24 1230 01/06/24 1555 01/06/24 1940 01/07/24 0009 01/07/24 0347  GLUCAP 203* 270* 264* 224* 204*   D-Dimer: No results for input(s): "DDIMER" in the last 72 hours. Hgb A1c: No results for input(s): "HGBA1C" in the last 72 hours. Lipid Profile: No results for input(s): "CHOL", "HDL", "LDLCALC", "TRIG", "CHOLHDL", "LDLDIRECT" in the last 72 hours. Thyroid function studies: No results for input(s): "TSH", "T4TOTAL", "T3FREE", "THYROIDAB" in the last 72 hours.  Invalid input(s): "FREET3" Anemia work up: No results for input(s): "VITAMINB12", "FOLATE", "FERRITIN", "TIBC", "IRON", "RETICCTPCT" in the last 72 hours. Sepsis Labs: Recent Labs  Lab 01/03/24 0444 01/04/24 0634 01/05/24 0559 01/07/24 0444  WBC 10.1 10.9* 11.2* 16.4*   Microbiology Recent Results (from the past 240 hours)  Blood Culture (routine x 2)     Status: None   Collection Time: 12/30/23 12:21 PM  Specimen: BLOOD  Result Value Ref Range Status   Specimen Description   Final    BLOOD BLOOD RIGHT HAND Performed at [facility], [address], [place], [place] [address]    Special  Requests   Final    BOTTLES DRAWN AEROBIC ONLY Blood Culture results may not be optimal due to an inadequate volume of blood received in culture bottles Performed at [facility], [address] [place] [address] [place], [place] [address]    Culture   Final    NO GROWTH 5 DAYS Performed at [facility], [address], [place], [place] [address]    Report Status 01/04/2024 FINAL  Final  Blood Culture (routine x 2)     Status: None   Collection Time: 12/30/23 12:35 PM   Specimen: BLOOD  Result Value Ref Range Status   Specimen Description   Final    BLOOD LEFT ANTECUBITAL Performed at [facility], [address] [place], [place] [address]    Special Requests   Final    BOTTLES DRAWN AEROBIC AND ANAEROBIC Blood Culture adequate volume Performed at [facility], [address], [place], [place] [address]    Culture   Final    NO GROWTH 5 DAYS Performed at [facility], [address], [place], [place] [address]    Report Status 01/04/2024 FINAL  Final  Resp panel by RT-PCR (RSV, Flu A&B, Covid) Anterior Nasal Swab     Status: None   Collection Time: 12/30/23  3:01 PM   Specimen: Anterior Nasal Swab  Result Value Ref Range Status   SARS Coronavirus 2 by RT PCR NEGATIVE NEGATIVE Final    Comment: (NOTE) SARS-CoV-2 target nucleic acids are NOT DETECTED.  The SARS-CoV-2 RNA is generally detectable in upper respiratory specimens during the acute phase of infection. The lowest concentration of SARS-CoV-2 viral copies this assay can detect is 138 copies/mL. A negative result does not preclude SARS-Cov-2 infection and should not be used as the sole basis for treatment or other patient management decisions. A negative result may occur with  improper specimen collection/handling, submission of specimen other than nasopharyngeal swab, presence of viral mutation(s) within the areas targeted by this assay, and inadequate number of  viral copies(<138 copies/mL). A negative result must be combined with clinical observations, patient history, and epidemiological information. The expected result is Negative.  Fact Sheet for Patients:  BloggerCourse.com  Fact Sheet for Healthcare Providers:  SeriousBroker.it  This test is no t yet approved or cleared by the [place]  FDA and  has been authorized for detection and/or diagnosis of SARS-CoV-2 by FDA under an Emergency Use Authorization (EUA). This EUA will remain  in effect (meaning this test can be used) for the duration of the COVID-19 declaration under Section 564(b)(1) of the Act, 21 U.S.C.section 360bbb-3(b)(1), unless the authorization is terminated  or revoked sooner.       Influenza A by PCR NEGATIVE NEGATIVE Final   Influenza B by PCR NEGATIVE NEGATIVE Final    Comment: (NOTE) The Xpert Xpress SARS-CoV-2/FLU/RSV plus assay is intended as an aid in the diagnosis of influenza from Nasopharyngeal swab specimens and should not be used as a sole basis for treatment. Nasal washings and aspirates are unacceptable for Xpert Xpress SARS-CoV-2/FLU/RSV testing.  Fact Sheet for Patients: BloggerCourse.com  Fact Sheet for Healthcare Providers: SeriousBroker.it  This test is not yet approved or cleared by the [place]  FDA and has been authorized  for detection and/or diagnosis of SARS-CoV-2 by FDA under an Emergency Use Authorization (EUA). This EUA will remain in effect (meaning this test can be used) for the duration of the COVID-19 declaration under Section 564(b)(1) of the Act, 21 U.S.C. section 360bbb-3(b)(1), unless the authorization is terminated or revoked.     Resp Syncytial Virus by PCR NEGATIVE NEGATIVE Final    Comment: (NOTE) Fact Sheet for Patients: BloggerCourse.com  Fact Sheet for Healthcare  Providers: SeriousBroker.it  This test is not yet approved or cleared by the [place]  FDA and has been authorized for detection and/or diagnosis of SARS-CoV-2 by FDA under an Emergency Use Authorization (EUA). This EUA will remain in effect (meaning this test can be used) for the duration of the COVID-19 declaration under Section 564(b)(1) of the Act, 21 U.S.C. section 360bbb-3(b)(1), unless the authorization is terminated or revoked.  Performed at [facility], [address], [place], [place] [address]   MRSA Next Gen by PCR, Nasal     Status: None   Collection Time: 12/30/23  5:16 PM   Specimen: Nasal Mucosa; Nasal Swab  Result Value Ref Range Status   MRSA by PCR Next Gen NOT DETECTED NOT DETECTED Final    Comment: (NOTE) The GeneXpert MRSA Assay (FDA approved for NASAL specimens only), is one component of a comprehensive MRSA colonization surveillance program. It is not intended to diagnose MRSA infection nor to guide or monitor treatment for MRSA infections. Test performance is not FDA approved in patients less than 18 years old. Performed at [facility], [address], [place], [place] [address]      Medications:    sodium chloride    Intravenous Once   Chlorhexidine  Gluconate Cloth  6 each Topical Daily   feeding supplement (PROSource TF20)  60 mL Per Tube Daily   guaiFENesin -dextromethorphan   10 mL Per Tube Q6H   insulin  aspart  0-9 Units Subcutaneous Q4H   lactulose   20 g Per Tube TID   levothyroxine   150 mcg Per Tube Once per day on Sunday Saturday   levothyroxine   300 mcg Per Tube Once per day on Monday Tuesday Wednesday Thursday Friday   midodrine   10 mg Per Tube TID WC   mouth rinse  15 mL Mouth Rinse 4 times per day   pantoprazole  (PROTONIX ) IV  40 mg Intravenous Q12H   sodium chloride  flush  10-40 mL Intracatheter Q12H   Continuous Infusions:  feeding supplement (VITAL 1.5 CAL) 60 mL/hr  at 01/07/24 0155   piperacillin -tazobactam (ZOSYN )  IV Stopped (01/07/24 0059)      LOS: 8 days   Macdonald Savoy  [facility]  01/07/2024, 6:23 AM

## 2024-01-07 NOTE — TOC Progression Note (Signed)
 Transition of Care [facility]) - Progression Note    Patient Details  Name: Dana Thornton MRN: [id number] Date of Birth: 05-02-1956  Transition of Care [facility]) CM/SW Contact  Delilah Fend, LCSW Phone Number: 01/07/2024, 3:00 PM  Clinical Narrative:     TOC continuing to follow for any dc planning needs.      Barriers to Discharge: Continued Medical Work up  Expected Discharge Plan and Services In-house Referral: NA     Living arrangements for the past 2 months: Single Family Home                 DME Arranged: N/A DME Agency: NA       HH Arranged: NA HH Agency: NA         Social Determinants of Health (SDOH) Interventions SDOH Screenings   Food Insecurity: No Food Insecurity (12/30/2023)  Recent Concern: Food Insecurity - Food Insecurity Present (11/13/2023)  Housing: Low Risk  (12/30/2023)  Transportation Needs: No Transportation Needs (12/30/2023)  Utilities: Not At Risk (12/30/2023)  Depression (PHQ2-9): Low Risk  (11/27/2023)  Social Connections: Socially Integrated (10/10/2023)  Tobacco Use: Low Risk  (12/30/2023)    Readmission Risk Interventions    12/31/2023   11:15 AM 10/14/2023    9:31 AM  Readmission Risk Prevention Plan  Post Dischage Appt  Complete  Medication Screening  Complete  Transportation Screening Complete Complete  Medication Review (RN Care Manager) Complete   PCP or Specialist appointment within 3-5 days of discharge Complete   HRI or Home Care Consult Complete   SW Recovery Care/Counseling Consult Complete   Palliative Care Screening Not Applicable   Skilled Nursing Facility Not Applicable

## 2024-01-07 NOTE — Progress Notes (Signed)
 Nutrition Follow-up  DOCUMENTATION CODES:   Obesity unspecified  INTERVENTION:  - Continue tube feeding below via Cortrak: Vital 1.5 at 60 ml/h (1440 ml per day) Prosource TF20 60 ml daily Provides 2240 kcal, 117 gm protein, 1100 ml free water  daily   - FWF per CCM/MD. Currently ordered Q8H which in addition to tube feeds provides 1770mL/day   - Monitor weights.    NUTRITION DIAGNOSIS:   Inadequate oral intake related to inability to eat as evidenced by NPO status. *ongoing  GOAL:   Patient will meet greater than or equal to 90% of their needs *met with TF  MONITOR:   Diet advancement, Labs, Weight trends, TF tolerance  REASON FOR ASSESSMENT:   Consult Enteral/tube feeding initiation and management  ASSESSMENT:   68 y.o. 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 Admitted for hepatic encephalopathy and septic shock.  4/21 Admit; NPO 4/23 Cortrak placed (tip in stomach); TF initiated  Patient remains on goal TF, tolerating well.  Patient remains confused this AM.  She continues to have a poor prognosis per MD notes and goals of care remain ongoing.    Admit weight: 221# Current weight: 229#  Medications reviewed and include: Q8H FWF, Lactulose  TID  Labs reviewed:  Na 146 Creatinine 6.78 Blood Glucose 169-270 x24 hours   Diet Order:   Diet Order             Diet NPO time specified Except for: Sips with Meds  Diet effective now                   EDUCATION NEEDS:  Education needs have been addressed  Skin:  Skin Assessment: Reviewed RN Assessment  Last BM:  4/28 - rectal tube  Height:  Ht Readings from Last 1 Encounters:  12/30/23 5\' 4"  (1.626 m)   Weight:  Wt Readings from Last 1 Encounters:  01/07/24 104.3 kg   Ideal  Body Weight:  54.55 kg  BMI:  Body mass index is 39.47 kg/m.  Estimated Nutritional Needs:  Kcal:  2150-2300 kcals Protein:  100-125 grams Fluid:  >/= 2.1L (or per MD)    Scheryl Cushing RD, LDN Contact via Secure Chat.

## 2024-01-08 DIAGNOSIS — R579 Shock, unspecified: Secondary | ICD-10-CM | POA: Diagnosis not present

## 2024-01-08 LAB — COMPREHENSIVE METABOLIC PANEL WITH GFR
ALT: 89 U/L — ABNORMAL HIGH (ref 0–44)
AST: 180 U/L — ABNORMAL HIGH (ref 15–41)
Albumin: 1.6 g/dL — ABNORMAL LOW (ref 3.5–5.0)
Alkaline Phosphatase: 64 U/L (ref 38–126)
Anion gap: 11 (ref 5–15)
BUN: 100 mg/dL — ABNORMAL HIGH (ref 8–23)
CO2: 16 mmol/L — ABNORMAL LOW (ref 22–32)
Calcium: 8 mg/dL — ABNORMAL LOW (ref 8.9–10.3)
Chloride: 119 mmol/L — ABNORMAL HIGH (ref 98–111)
Creatinine, Ser: 7.4 mg/dL — ABNORMAL HIGH (ref 0.44–1.00)
GFR, Estimated: 6 mL/min — ABNORMAL LOW (ref >60)
Glucose, Bld: 114 mg/dL — ABNORMAL HIGH (ref 70–99)
Potassium: 4.5 mmol/L (ref 3.5–5.1)
Sodium: 146 mmol/L — ABNORMAL HIGH (ref 135–145)
Total Bilirubin: 5.6 mg/dL — ABNORMAL HIGH (ref 0.0–1.2)
Total Protein: 7.3 g/dL (ref 6.5–8.1)

## 2024-01-08 LAB — CBC
HCT: 20.1 % — ABNORMAL LOW (ref 36.0–46.0)
Hemoglobin: 6.5 g/dL — CL (ref 12.0–15.0)
MCH: 32.3 pg (ref 26.0–34.0)
MCHC: 32.3 g/dL (ref 30.0–36.0)
MCV: 100 fL (ref 80.0–100.0)
Platelets: 47 10*3/uL — ABNORMAL LOW (ref 150–400)
RBC: 2.01 MIL/uL — ABNORMAL LOW (ref 3.87–5.11)
RDW: 19.2 % — ABNORMAL HIGH (ref 11.5–15.5)
WBC: 17.4 10*3/uL — ABNORMAL HIGH (ref 4.0–10.5)
nRBC: 2.4 % — ABNORMAL HIGH (ref 0.0–0.2)

## 2024-01-08 LAB — HEMOGLOBIN AND HEMATOCRIT, BLOOD
HCT: 24 % — ABNORMAL LOW (ref 36.0–46.0)
HCT: 24.3 % — ABNORMAL LOW (ref 36.0–46.0)
Hemoglobin: 7.5 g/dL — ABNORMAL LOW (ref 12.0–15.0)
Hemoglobin: 7.7 g/dL — ABNORMAL LOW (ref 12.0–15.0)

## 2024-01-08 LAB — GLUCOSE, CAPILLARY
Glucose-Capillary: 107 mg/dL — ABNORMAL HIGH (ref 70–99)
Glucose-Capillary: 108 mg/dL — ABNORMAL HIGH (ref 70–99)
Glucose-Capillary: 112 mg/dL — ABNORMAL HIGH (ref 70–99)
Glucose-Capillary: 111 mg/dL — ABNORMAL HIGH (ref 70–99)
Glucose-Capillary: 126 mg/dL — ABNORMAL HIGH (ref 70–99)
Glucose-Capillary: 141 mg/dL — ABNORMAL HIGH (ref 70–99)

## 2024-01-08 LAB — PREPARE RBC (CROSSMATCH)

## 2024-01-08 MED ORDER — SODIUM CHLORIDE 0.9% IV SOLUTION: Freq: Once | INTRAVENOUS | Status: AC

## 2024-01-08 MED ORDER — SODIUM CHLORIDE 0.9 % IV BOLUS
250.0000 mL | Freq: Once | INTRAVENOUS | Status: AC
  Administered 2024-01-08: 250 mL via INTRAVENOUS

## 2024-01-08 MED ORDER — SODIUM CHLORIDE 0.9 % IV SOLN: INTRAVENOUS | Status: AC

## 2024-01-08 NOTE — Plan of Care (Signed)
  Problem: Education: Goal: Individualized Educational Video(s) Outcome: Not Progressing   Problem: Coping: Goal: Ability to adjust to condition or change in health will improve Outcome: Not Progressing   Problem: Fluid Volume: Goal: Ability to maintain a balanced intake and output will improve Outcome: Not Progressing

## 2024-01-08 NOTE — Progress Notes (Signed)
 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  She was also treated with lactulose ; she required packed red cell transfusion for drop in hemoglobin.  Hospitalization complicated by worsening renal function.  Patient is not a dialysis candidate.  Oncology recommended hospice/comfort measures.  Patient was changed to DNR.  Care transferred to [facility] service from 01/06/2024 onwards.  Assessment & Plan:   Acute hepatic encephalopathy/hyperammonemia Autoimmune hepatitis Hepatic cirrhosis with recurrent ascites Stage IV hepatocellular carcinoma Portal hypertension Hyperbilirubinemia Elevated LFTs Hypoalbuminemia - AST and ALT are almost the same compared to 01/07/2024 but bili total continues to rise.  Aldactone and Lasix continue to remain on hold because of hypotension. -Continue lactulose .  Slow to respond but responds appropriately as per family at bedside - Prognosis remains very poor.  Oncology recommended hospice/comfort measures.  Family understand this but want to continue current course of action including daily blood works and transfusion if needed.  I have explained to the family regarding the futility of the same.  I will consult palliative care.  Coagulopathy/anemia of chronic disease/thrombocytopenia - Patient has received multiple packed red cells transfusion during this hospitalization.  Hemoglobin 6.5 this morning.  1 unit of packed red cell has been ordered  for transfusion again this morning.  Family want to continue doing daily blood work for now.  Platelets trending downwards as well.  Patient also received fresh frozen plasma during this hospitalization.  Septic shock: Present on admission Acute diverticulitis with possible diverticular abscess - Shock is resolved.  Off pressors.  Care transferred to [facility] service from 01/06/2024 onwards.  Blood pressure on the lower side but stable.  Continue midodrine .  Remains on Zosyn .  Acute kidney injury on chronic kidney disease stage IIIb Acute metabolic acidosis - Baseline creatinine of 1.9-2.2.  Possibly from ATN in the setting of septic shock and diuretic use.  Creatinine continues to worsen and is 7.4 today along with worsening acidosis.  Urine output is almost none.  Not a dialysis candidate.  Family aware.  Want to continue checking kidney function.  Leukocytosis - Worsening.  Monitor.  Blood cultures negative so far.  Hypernatremia - Continue free water  flushes per tube  Diabetes mellitus type 2 with hyperglycemia -continue CBGs with SSI  Hypothyroidism -Continue levothyroxine  per tube  Nutrition -Continue tube feeds per dietary recommendations  Obesity class I - Outpatient follow-up   DVT prophylaxis: SCDs Code Status: DNR Family Communication: Husband, son, daughter Disposition Plan: Status is: Inpatient Remains inpatient appropriate because: Of severity of illness    Consultants: PCCM/oncology.  Consult palliative care  Antimicrobials:  Anti-infectives (From admission, onward)    Start     Dose/Rate Route Frequency Ordered Stop   12/31/23 1000  piperacillin -tazobactam (ZOSYN ) IVPB 3.375 g        3.375 g 12.5 mL/hr over 240 Minutes Intravenous 2 times daily 12/30/23 1558     12/31/23 1000  linezolid  (ZYVOX ) IVPB 600 mg  Status:  Discontinued        600 mg 300 mL/hr over 60 Minutes Intravenous Every 12 hours 12/30/23 1723 12/31/23 0708  12/30/23 1230  ceFEPIme   (MAXIPIME ) 2 g in sodium chloride  0.9 % 100 mL IVPB        2 g 200 mL/hr over 30 Minutes Intravenous  Once 12/30/23 1218 12/30/23 1337   12/30/23 1230  metroNIDAZOLE  (FLAGYL ) IVPB 500 mg        500 mg 100 mL/hr over 60 Minutes Intravenous  Once 12/30/23 1218 12/30/23 1445   12/30/23 1230  vancomycin  (VANCOCIN ) IVPB 1000 mg/200 mL premix        1,000 mg 200 mL/hr over 60 Minutes Intravenous  Once 12/30/23 1218 12/30/23 1658        Subjective: Patient seen and examined at bedside.  Wakes up sightly, slow to respond.  No fever, seizures, agitation reported.  Objective: Vitals:   01/08/24 0612 01/08/24 0800 01/08/24 0915 01/08/24 1000  BP: (!) 93/43   (!) 103/47  Pulse: 83  82 87  Resp: 13  12 11   Temp: 97.7 F (36.5 C) (!) 97.1 F (36.2 C) 98.1 F (36.7 C)   TempSrc: Axillary Axillary Oral   SpO2: 100%  100% 100%  Weight:      Height:        Intake/Output Summary (Last 24 hours) at 01/08/2024 1101 Last data filed at 01/08/2024 0916 Gross per 24 hour  Intake 1158.41 ml  Output 530 ml  Net 628.41 ml   Filed Weights   01/06/24 0500 01/07/24 0500 01/08/24 0500  Weight: 103.8 kg 104.3 kg 104.3 kg    Examination:  General exam: Appears calm and comfortable.  Looks chronically ill and deconditioned.  On room air.  Looks jaundiced. ENT: Feeding tube present. Respiratory system: Bilateral decreased breath sounds at bases with scattered crackles Cardiovascular system: S1 & S2 heard, Rate controlled Gastrointestinal system: Abdomen is obese, distended, soft and nontender. Normal bowel sounds heard. Extremities: No cyanosis, clubbing, edema  Central nervous system: Wakes up slightly, slow to respond.  Poor historian.  No focal neurological deficits. Moving extremities Skin: No rashes, lesions or ulcers Psychiatry: Flat affect.  Not agitated.  Data Reviewed: I have personally reviewed following labs and imaging studies  CBC: Recent Labs  Lab 01/03/24 0444 01/03/24 1451  01/04/24 0634 01/05/24 0559 01/07/24 0444 01/07/24 1150 01/08/24 0406  WBC 10.1  --  10.9* 11.2* 16.4*  --  17.4*  HGB 6.6*   < > 7.9* 7.4* 6.2* 7.3* 6.5*  HCT 19.5*   < > 23.9* 21.9* 19.1* 22.9* 20.1*  MCV 98.5  --  97.6 96.5 101.1*  --  100.0  PLT 67*  --  58* 55* 54*  --  47*   < > = values in this interval not displayed.   Basic Metabolic Panel: Recent Labs  Lab 01/01/24 1712 01/01/24 2200 01/02/24 0301 01/02/24 0620 01/02/24 1900 01/02/24 2206 01/03/24 0444 01/03/24 0629 01/03/24 0825 01/04/24 1610 01/05/24 0559 01/07/24 0444 01/08/24 0406  NA  --    < > 135  --   --   --  132*  --   --  142 142 146* 146*  K 5.0   < > 5.1   < > 4.6   < > 4.3   < > 4.2 4.1 4.1 4.5 4.5  CL  --    < > 108  --   --   --  104  --   --  112* 115* 118* 119*  CO2  --    < > 18*  --   --   --  19*  --   --  19* 19* 19* 16*  GLUCOSE  --    < > 141*  --   --   --  218*  --   --  163* 200* 237* 114*  BUN  --    < > 65*  --   --   --  63*  --   --  68* 74* 94* 100*  CREATININE  --    < > 6.26*  --   --   --  6.26*  --   --  5.83* 5.52* 6.78* 7.40*  CALCIUM   --    < > 8.1*  --   --   --  7.2*  --   --  8.0* 7.8* 8.2* 8.0*  MG 2.2  --  2.3  --  2.3  --  2.2  --   --   --   --   --   --   PHOS 6.7*  --  7.1*  --  7.1*  --  6.1*  --   --  5.3* 4.6  --   --    < > = values in this interval not displayed.   GFR: Estimated Creatinine Clearance: 8.7 mL/min (A) (by C-G formula based on SCr of 7.4 mg/dL (H)). Liver Function Tests: Recent Labs  Lab 01/03/24 0444 01/04/24 0634 01/05/24 0559 01/07/24 0444 01/08/24 0406  AST 267* 208* 181* 177* 180*  ALT 163* 126* 105* 95* 89*  ALKPHOS 63 74 75 78 64  BILITOT 4.5* 4.4* 3.9* 4.5* 5.6*  PROT 7.6 7.9 7.5 7.7 7.3  ALBUMIN  1.7* 2.2* 1.7* 1.5* 1.6*   No results for input(s): "LIPASE", "AMYLASE" in the last 168 hours. Recent Labs  Lab 01/01/24 1232 01/03/24 0444 01/04/24 1023  AMMONIA 55* 96* 35   Coagulation Profile: Recent Labs  Lab  01/02/24 0301 01/04/24 0634  INR 3.4* 4.0*   Cardiac Enzymes: No results for input(s): "CKTOTAL", "CKMB", "CKMBINDEX", "TROPONINI" in the last 168 hours. BNP (last 3 results) No results for input(s): "PROBNP" in the last 8760 hours. HbA1C: No results for input(s): "HGBA1C" in the last 72 hours. CBG: Recent Labs  Lab 01/07/24 1559 01/07/24 1945 01/07/24 2346 01/08/24 0355 01/08/24 0731  GLUCAP 132* 133* 116* 107* 108*   Lipid Profile: No results for input(s): "CHOL", "HDL", "LDLCALC", "TRIG", "CHOLHDL", "LDLDIRECT" in the last 72 hours. Thyroid Function Tests: No results for input(s): "TSH", "T4TOTAL", "FREET4", "T3FREE", "THYROIDAB" in the last 72 hours. Anemia Panel: No results for input(s): "VITAMINB12", "FOLATE", "FERRITIN", "TIBC", "IRON", "RETICCTPCT" in the last 72 hours. Sepsis Labs: No results for input(s): "PROCALCITON", "LATICACIDVEN" in the last 168 hours.  Recent Results (from the past 240 hours)  Blood Culture (routine x 2)     Status: None   Collection Time: 12/30/23 12:21 PM   Specimen: BLOOD  Result Value Ref Range Status   Specimen Description   Final    BLOOD BLOOD RIGHT HAND Performed at [facility], [address], [place], [place] [address]    Special Requests   Final    BOTTLES DRAWN AEROBIC ONLY Blood Culture results may not be optimal due to an inadequate volume of blood received in culture bottles Performed at [facility], [address] [place], [place] [address]    Culture   Final    NO GROWTH 5 DAYS Performed at [facility], [address] [place], [place] [address]    Report Status 01/04/2024 FINAL  Final  Blood Culture (routine  x 2)     Status: None   Collection Time: 12/30/23 12:35 PM   Specimen: BLOOD  Result Value Ref Range Status   Specimen Description   Final    BLOOD LEFT ANTECUBITAL Performed at [facility], [address], [place], [place] [address]     Special Requests   Final    BOTTLES DRAWN AEROBIC AND ANAEROBIC Blood Culture adequate volume Performed at [facility], [address], [place], [place] [address]    Culture   Final    NO GROWTH 5 DAYS Performed at [facility], [address], [place], [place] [address]    Report Status 01/04/2024 FINAL  Final  Resp panel by RT-PCR (RSV, Flu A&B, Covid) Anterior Nasal Swab     Status: None   Collection Time: 12/30/23  3:01 PM   Specimen: Anterior Nasal Swab  Result Value Ref Range Status   SARS Coronavirus 2 by RT PCR NEGATIVE NEGATIVE Final    Comment: (NOTE) SARS-CoV-2 target nucleic acids are NOT DETECTED.  The SARS-CoV-2 RNA is generally detectable in upper respiratory specimens during the acute phase of infection. The lowest concentration of SARS-CoV-2 viral copies this assay can detect is 138 copies/mL. A negative result does not preclude SARS-Cov-2 infection and should not be used as the sole basis for treatment or other patient management decisions. A negative result may occur with  improper specimen collection/handling, submission of specimen other than nasopharyngeal swab, presence of viral mutation(s) within the areas targeted by this assay, and inadequate number of viral copies(<138 copies/mL). A negative result must be combined with clinical observations, patient history, and epidemiological information. The expected result is Negative.  Fact Sheet for Patients:  BloggerCourse.com  Fact Sheet for Healthcare Providers:  SeriousBroker.it  This test is no t yet approved or cleared by the [place]  FDA and  has been authorized for detection and/or diagnosis of SARS-CoV-2 by FDA under an Emergency Use Authorization (EUA). This EUA will remain  in effect (meaning this test can be used) for the duration of the COVID-19 declaration under Section 564(b)(1) of the Act, 21 U.S.C.section  360bbb-3(b)(1), unless the authorization is terminated  or revoked sooner.       Influenza A by PCR NEGATIVE NEGATIVE Final   Influenza B by PCR NEGATIVE NEGATIVE Final    Comment: (NOTE) The Xpert Xpress SARS-CoV-2/FLU/RSV plus assay is intended as an aid in the diagnosis of influenza from Nasopharyngeal swab specimens and should not be used as a sole basis for treatment. Nasal washings and aspirates are unacceptable for Xpert Xpress SARS-CoV-2/FLU/RSV testing.  Fact Sheet for Patients: BloggerCourse.com  Fact Sheet for Healthcare Providers: SeriousBroker.it  This test is not yet approved or cleared by the [place]  FDA and has been authorized for detection and/or diagnosis of SARS-CoV-2 by FDA under an Emergency Use Authorization (EUA). This EUA will remain in effect (meaning this test can be used) for the duration of the COVID-19 declaration under Section 564(b)(1) of the Act, 21 U.S.C. section 360bbb-3(b)(1), unless the authorization is terminated or revoked.     Resp Syncytial Virus by PCR NEGATIVE NEGATIVE Final    Comment: (NOTE) Fact Sheet for Patients: BloggerCourse.com  Fact Sheet for Healthcare Providers: SeriousBroker.it  This test is not yet approved or cleared by the [place]  FDA and has been authorized for detection and/or diagnosis of SARS-CoV-2 by FDA under an Emergency Use Authorization (EUA). This EUA will remain in effect (meaning this test can  be used) for the duration of the COVID-19 declaration under Section 564(b)(1) of the Act, 21 U.S.C. section 360bbb-3(b)(1), unless the authorization is terminated or revoked.  Performed at [facility], [address], [place], [place] [address]   MRSA Next Gen by PCR, Nasal     Status: None   Collection Time: 12/30/23  5:16 PM   Specimen: Nasal Mucosa; Nasal Swab  Result Value Ref  Range Status   MRSA by PCR Next Gen NOT DETECTED NOT DETECTED Final    Comment: (NOTE) The GeneXpert MRSA Assay (FDA approved for NASAL specimens only), is one component of a comprehensive MRSA colonization surveillance program. It is not intended to diagnose MRSA infection nor to guide or monitor treatment for MRSA infections. Test performance is not FDA approved in patients less than 65 years old. Performed at [facility], [address], [place], [place] [address]          Radiology Studies: No results found.      Scheduled Meds:  Chlorhexidine  Gluconate Cloth  6 each Topical Daily   feeding supplement (PROSource TF20)  60 mL Per Tube Daily   free water   200 mL Per Tube Q8H   guaiFENesin -dextromethorphan   10 mL Per Tube Q6H   insulin  aspart  0-9 Units Subcutaneous Q4H   lactulose   20 g Per Tube TID   levothyroxine   150 mcg Per Tube Once per day on Sunday Saturday   levothyroxine   300 mcg Per Tube Once per day on Monday Tuesday Wednesday Thursday Friday   midodrine   10 mg Per Tube TID WC   mouth rinse  15 mL Mouth Rinse 4 times per day   pantoprazole  (PROTONIX ) IV  40 mg Intravenous Q12H   sodium chloride  flush  10-40 mL Intracatheter Q12H   Continuous Infusions:  sodium chloride  40 mL/hr at 01/08/24 0701   feeding supplement (VITAL 1.5 CAL) Stopped (01/07/24 0500)   piperacillin -tazobactam (ZOSYN )  IV 3.375 g (01/08/24 0819)          Audria Leather, MD [facility] 01/08/2024, 11:01 AM

## 2024-01-08 NOTE — Plan of Care (Signed)
   Problem: Metabolic: Goal: Ability to maintain appropriate glucose levels will improve Outcome: Progressing   Problem: Skin Integrity: Goal: Risk for impaired skin integrity will decrease Outcome: Progressing

## 2024-01-08 NOTE — Progress Notes (Signed)
   01/08/24 1425  Spiritual Encounters  Type of Visit Follow up  Care provided to: Pt and family  Conversation partners present during encounter Nurse  OnCall Visit No   Checked in on patient and family. Family report no current needs at this time. Declined spiritual support for now.

## 2024-01-08 NOTE — Progress Notes (Signed)
       Overnight   NAME: Dana Thornton MRN: [id number] DOB : 1956/02/14    Date of Service   01/08/2024   HPI/Events of Note    Notified by RN for hemoglobin.   Latest Reference Range & Units 01/08/24 04:06  WBC 4.0 - 10.5 K/uL 17.4 (H)  RBC 3.87 - 5.11 MIL/uL 2.01 (L)  Hemoglobin 12.0 - 15.0 g/dL 6.5 (LL)  HCT 86.5 - 78.4 % 20.1 (L)  MCV 80.0 - 100.0 fL 100.0  MCH 26.0 - 34.0 pg 32.3  MCHC 30.0 - 36.0 g/dL 69.6  RDW 29.5 - 28.4 % 19.2 (H)  Platelets 150 - 400 K/uL 47 (L)  nRBC 0.0 - 0.2 % 2.4 (H)  (LL): Data is critically low (H): Data is abnormally high (L): Data is abnormally low  Family aware at bedside. Transfusion is accepted/requested .   Interventions/ Plan   (One) unit PRBCs ordered. Post transfusion H&H per timing of RN.       Denece Finger BSN MSNA MSN ACNPC-AG Acute Care Nurse Practitioner [facility]

## 2024-01-09 DIAGNOSIS — R579 Shock, unspecified: Secondary | ICD-10-CM | POA: Diagnosis not present

## 2024-01-09 LAB — GLUCOSE, CAPILLARY
Glucose-Capillary: 122 mg/dL — ABNORMAL HIGH (ref 70–99)
Glucose-Capillary: 109 mg/dL — ABNORMAL HIGH (ref 70–99)
Glucose-Capillary: 114 mg/dL — ABNORMAL HIGH (ref 70–99)
Glucose-Capillary: 137 mg/dL — ABNORMAL HIGH (ref 70–99)
Glucose-Capillary: 151 mg/dL — ABNORMAL HIGH (ref 70–99)

## 2024-01-09 LAB — TYPE AND SCREEN
ABO/RH(D): A POS
Antibody Screen: NEGATIVE
Unit division: 0
Unit division: 0

## 2024-01-09 LAB — COMPREHENSIVE METABOLIC PANEL WITH GFR
ALT: 92 U/L — ABNORMAL HIGH (ref 0–44)
AST: 192 U/L — ABNORMAL HIGH (ref 15–41)
Albumin: 1.6 g/dL — ABNORMAL LOW (ref 3.5–5.0)
Alkaline Phosphatase: 66 U/L (ref 38–126)
Anion gap: 14 (ref 5–15)
BUN: 117 mg/dL — ABNORMAL HIGH (ref 8–23)
CO2: 15 mmol/L — ABNORMAL LOW (ref 22–32)
Calcium: 8.2 mg/dL — ABNORMAL LOW (ref 8.9–10.3)
Chloride: 115 mmol/L — ABNORMAL HIGH (ref 98–111)
Creatinine, Ser: 9.04 mg/dL — ABNORMAL HIGH (ref 0.44–1.00)
GFR, Estimated: 4 mL/min — ABNORMAL LOW (ref >60)
Glucose, Bld: 121 mg/dL — ABNORMAL HIGH (ref 70–99)
Potassium: 4.8 mmol/L (ref 3.5–5.1)
Sodium: 144 mmol/L (ref 135–145)
Total Bilirubin: 6.4 mg/dL — ABNORMAL HIGH (ref 0.0–1.2)
Total Protein: 7.7 g/dL (ref 6.5–8.1)

## 2024-01-09 LAB — BPAM RBC
Blood Product Expiration Date: 202505272359
Blood Product Expiration Date: 202505272359
ISSUE DATE / TIME: 202504290640
ISSUE DATE / TIME: 202504300545
Unit Type and Rh: 6200
Unit Type and Rh: 6200

## 2024-01-09 LAB — CBC
HCT: 24.7 % — ABNORMAL LOW (ref 36.0–46.0)
Hemoglobin: 7.9 g/dL — ABNORMAL LOW (ref 12.0–15.0)
MCH: 31.6 pg (ref 26.0–34.0)
MCHC: 32 g/dL (ref 30.0–36.0)
MCV: 98.8 fL (ref 80.0–100.0)
Platelets: 51 10*3/uL — ABNORMAL LOW (ref 150–400)
RBC: 2.5 MIL/uL — ABNORMAL LOW (ref 3.87–5.11)
RDW: 20.3 % — ABNORMAL HIGH (ref 11.5–15.5)
WBC: 17.7 10*3/uL — ABNORMAL HIGH (ref 4.0–10.5)
nRBC: 3.2 % — ABNORMAL HIGH (ref 0.0–0.2)

## 2024-01-09 MED ORDER — FENTANYL CITRATE PF 50 MCG/ML IJ SOSY
12.5000 ug | PREFILLED_SYRINGE | INTRAMUSCULAR | Status: DC | PRN
  Administered 2024-01-09 – 2024-01-11 (×3): 12.5 ug via INTRAVENOUS
  Filled 2024-01-09 (×3): qty 1

## 2024-01-09 MED ORDER — MIDODRINE HCL 5 MG PO TABS
15.0000 mg | ORAL_TABLET | Freq: Three times a day (TID) | ORAL | Status: DC
  Administered 2024-01-09 – 2024-01-10 (×5): 15 mg
  Filled 2024-01-09 (×5): qty 3

## 2024-01-09 MED ORDER — OXYCODONE HCL 5 MG/5ML PO SOLN
5.0000 mg | ORAL | Status: DC | PRN
  Administered 2024-01-09 – 2024-01-11 (×3): 5 mg
  Filled 2024-01-09 (×3): qty 5

## 2024-01-09 MED ORDER — ORAL CARE MOUTH RINSE: 15.0000 mL | OROMUCOSAL | Status: DC | PRN

## 2024-01-09 MED ORDER — AMOXICILLIN-POT CLAVULANATE 400-57 MG/5ML PO SUSR
500.0000 mg | ORAL | Status: DC
  Administered 2024-01-09 – 2024-01-10 (×2): 500 mg
  Filled 2024-01-09 (×3): qty 10

## 2024-01-09 NOTE — Progress Notes (Signed)
 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  She was also treated with lactulose ; she required packed red cell transfusion for drop in hemoglobin.  Hospitalization complicated by worsening renal function.  Patient is not a dialysis candidate.  Oncology recommended hospice/comfort measures.  Patient was changed to DNR.  Care transferred to TR[facility] service from 01/06/2024 onwards.  Assessment & Plan:   Acute hepatic encephalopathy/hyperammonemia Autoimmune hepatitis Hepatic cirrhosis with recurrent ascites Stage IV hepatocellular carcinoma Portal hypertension Hyperbilirubinemia Elevated LFTs Hypoalbuminemia - AST and ALT are almost the same over the last few days but bili total continues to rise and is at 6.4 today.  Aldactone and Lasix continue to remain on hold because of hypotension. -Continue lactulose .  Slow to respond but responds appropriately as per family at bedside - Prognosis remains very poor.  Oncology recommended hospice/comfort measures.  Family understand this but want to continue current course of action including daily blood works and transfusion if needed.  I have explained to the family regarding the futility of the same.  Palliative care consultation is pending  Coagulopathy/anemia of chronic disease/thrombocytopenia - Patient has received multiple packed red cells transfusion during this hospitalization.  Hemoglobin 6.5 on 01/08/2024.  Patient  received another unit of packed red cells.  Hemoglobin 7.9 this morning family want to continue doing daily blood work for now.  Platelets trending downwards as well.  Patient also received fresh frozen plasma during this hospitalization.  Septic shock: Present on admission Acute diverticulitis with possible diverticular abscess - Shock is resolved.  Off pressors.  Care transferred to TRH service from 01/06/2024 onwards.  Blood pressure on the lower side but stable.  Continue midodrine .  Remains on Zosyn .  Acute kidney injury on chronic kidney disease stage IIIb Acute metabolic acidosis - Baseline creatinine of 1.9-2.2.  Possibly from ATN in the setting of septic shock and diuretic use.  Creatinine continues to worsen and is 9.04 today.  Bicarb is 15 today.  Urine output is almost none.  Not a dialysis candidate.  Family aware.  Want to continue checking kidney function.  Leukocytosis - Worsening.  Monitor.  Blood cultures negative so far.  Hypernatremia - Improved  Diabetes mellitus type 2 with hyperglycemia -continue CBGs with SSI  Hypothyroidism -Continue levothyroxine  per tube  Nutrition -Continue tube feeds per dietary recommendations  Obesity class I - Outpatient follow-up   DVT prophylaxis: SCDs Code Status: DNR Family Communication: Husband and daughter at bedside Disposition Plan: Status is: Inpatient Remains inpatient appropriate because: Of severity of illness    Consultants: PCCM/oncology.  palliative care  Antimicrobials:  Anti-infectives (From admission, onward)    Start     Dose/Rate Route Frequency Ordered Stop   12/31/23 1000  piperacillin -tazobactam (ZOSYN ) IVPB 3.375 g        3.375 g 12.5 mL/hr over 240 Minutes Intravenous 2 times daily 12/30/23 1558     12/31/23 1000  linezolid  (ZYVOX ) IVPB 600 mg  Status:  Discontinued        600 mg 300 mL/hr over 60 Minutes Intravenous Every 12 hours 12/30/23 1723 12/31/23 0708  12/30/23 1230  ceFEPIme   (MAXIPIME ) 2 g in sodium chloride  0.9 % 100 mL IVPB        2 g 200 mL/hr over 30 Minutes Intravenous  Once 12/30/23 1218 12/30/23 1337   12/30/23 1230  metroNIDAZOLE  (FLAGYL ) IVPB 500 mg        500 mg 100 mL/hr over 60 Minutes Intravenous  Once 12/30/23 1218 12/30/23 1445   12/30/23 1230  vancomycin  (VANCOCIN ) IVPB 1000 mg/200 mL premix        1,000 mg 200 mL/hr over 60 Minutes Intravenous  Once 12/30/23 1218 12/30/23 1658        Subjective: Patient seen and examined at bedside.  No seizures, fever, vomiting or agitation reported. Objective: Vitals:   01/09/24 0300 01/09/24 0400 01/09/24 0500 01/09/24 0600  BP: (!) 95/45 (!) 101/45 (!) 99/51 (!) 105/47  Pulse: 79 85 79 89  Resp: (!) 9 10 10 10   Temp:  (!) 97.5 F (36.4 C)    TempSrc:  Oral    SpO2: 100% 100% 99% 100%  Weight:   104.3 kg   Height:        Intake/Output Summary (Last 24 hours) at 01/09/2024 0720 Last data filed at 01/09/2024 0655 Gross per 24 hour  Intake 1110.87 ml  Output 1060 ml  Net 50.87 ml   Filed Weights   01/07/24 0500 01/08/24 0500 01/09/24 0500  Weight: 104.3 kg 104.3 kg 104.3 kg    Examination:  General: On room air.  No distress.  Chronically ill and deconditioned looking.  Jaundiced. ENT/neck: No thyromegaly.  JVD is not elevated.  Has a feeding tube. respiratory: Decreased breath sounds at bases bilaterally with some crackles; no wheezing CVS: S1-S2 heard, rate controlled currently Abdominal: Soft, obese, nontender, slightly distended; no organomegaly, bowel sounds are heard Extremities: Trace lower extremity edema; no cyanosis  CNS: Wakes up slightly, remains slow to respond.  No focal neurologic deficit.  Moves extremities Lymph: No obvious lymphadenopathy Skin: No obvious ecchymosis/lesions  psych: Extremely flat affect.  Not agitated currently. musculoskeletal: No obvious joint swelling/deformity   Data Reviewed: I have personally reviewed following labs and imaging  studies  CBC: Recent Labs  Lab 01/04/24 0634 01/05/24 0559 01/07/24 0444 01/07/24 1150 01/08/24 0406 01/08/24 1118 01/08/24 1817 01/09/24 0356  WBC 10.9* 11.2* 16.4*  --  17.4*  --   --  17.7*  HGB 7.9* 7.4* 6.2* 7.3* 6.5* 7.5* 7.7* 7.9*  HCT 23.9* 21.9* 19.1* 22.9* 20.1* 24.0* 24.3* 24.7*  MCV 97.6 96.5 101.1*  --  100.0  --   --  98.8  PLT 58* 55* 54*  --  47*  --   --  51*   Basic Metabolic Panel: Recent Labs  Lab 01/02/24 1900 01/02/24 2206 01/03/24 0444 01/03/24 0629 01/04/24 0634 01/05/24 0559 01/07/24 0444 01/08/24 0406 01/09/24 0356  NA  --    < > 132*  --  142 142 146* 146* 144  K 4.6   < > 4.3   < > 4.1 4.1 4.5 4.5 4.8  CL  --    < > 104  --  112* 115* 118* 119* 115*  CO2  --    < > 19*  --  19* 19* 19* 16* 15*  GLUCOSE  --    < > 218*  --  163* 200* 237* 114* 121*  BUN  --    < > 63*  --  68* 74* 94* 100* 117*  CREATININE  --    < >  6.26*  --  5.83* 5.52* 6.78* 7.40* 9.04*  CALCIUM   --    < > 7.2*  --  8.0* 7.8* 8.2* 8.0* 8.2*  MG 2.3  --  2.2  --   --   --   --   --   --   PHOS 7.1*  --  6.1*  --  5.3* 4.6  --   --   --    < > = values in this interval not displayed.   GFR: Estimated Creatinine Clearance: 7.1 mL/min (A) (by C-G formula based on SCr of 9.04 mg/dL (H)). Liver Function Tests: Recent Labs  Lab 01/04/24 0634 01/05/24 0559 01/07/24 0444 01/08/24 0406 01/09/24 0356  AST 208* 181* 177* 180* 192*  ALT 126* 105* 95* 89* 92*  ALKPHOS 74 75 78 64 66  BILITOT 4.4* 3.9* 4.5* 5.6* 6.4*  PROT 7.9 7.5 7.7 7.3 7.7  ALBUMIN  2.2* 1.7* 1.5* 1.6* 1.6*   No results for input(s): "LIPASE", "AMYLASE" in the last 168 hours. Recent Labs  Lab 01/03/24 0444 01/04/24 1023  AMMONIA 96* 35   Coagulation Profile: Recent Labs  Lab 01/04/24 0634  INR 4.0*   Cardiac Enzymes: No results for input(s): "CKTOTAL", "CKMB", "CKMBINDEX", "TROPONINI" in the last 168 hours. BNP (last 3 results) No results for input(s): "PROBNP" in the last 8760  hours. HbA1C: No results for input(s): "HGBA1C" in the last 72 hours. CBG: Recent Labs  Lab 01/08/24 1122 01/08/24 1547 01/08/24 1939 01/08/24 2321 01/09/24 0342  GLUCAP 112* 111* 126* 141* 122*   Lipid Profile: No results for input(s): "CHOL", "HDL", "LDLCALC", "TRIG", "CHOLHDL", "LDLDIRECT" in the last 72 hours. Thyroid Function Tests: No results for input(s): "TSH", "T4TOTAL", "FREET4", "T3FREE", "THYROIDAB" in the last 72 hours. Anemia Panel: No results for input(s): "VITAMINB12", "FOLATE", "FERRITIN", "TIBC", "IRON", "RETICCTPCT" in the last 72 hours. Sepsis Labs: No results for input(s): "PROCALCITON", "LATICACIDVEN" in the last 168 hours.  Recent Results (from the past 240 hours)  Blood Culture (routine x 2)     Status: None   Collection Time: 12/30/23 12:21 PM   Specimen: BLOOD  Result Value Ref Range Status   Specimen Description   Final    BLOOD BLOOD RIGHT HAND Performed at [facility], [address], [place], [place] [address]    Special Requests   Final    BOTTLES DRAWN AEROBIC ONLY Blood Culture results may not be optimal due to an inadequate volume of blood received in culture bottles Performed at [facility], [address], [place], [place] [address]    Culture   Final    NO GROWTH 5 DAYS Performed at [facility], [address] [place], [place] [address]    Report Status 01/04/2024 FINAL  Final  Blood Culture (routine x 2)     Status: None   Collection Time: 12/30/23 12:35 PM   Specimen: BLOOD  Result Value Ref Range Status   Specimen Description   Final    BLOOD LEFT ANTECUBITAL Performed at [facility], [address], [place], [place] [address]    Special Requests   Final    BOTTLES DRAWN AEROBIC AND ANAEROBIC Blood Culture adequate volume Performed at [facility], [address], [place], [place] [address]    Culture   Final    NO GROWTH 5 DAYS Performed  at [facility], [address] [place], [place] [address]    Report Status 01/04/2024 FINAL  Final  Resp panel by RT-PCR (RSV, Flu A&B, Covid) Anterior Nasal Swab     Status: None   Collection Time: 12/30/23  3:01 PM   Specimen: Anterior Nasal Swab  Result Value Ref Range Status   SARS Coronavirus 2 by RT PCR NEGATIVE NEGATIVE Final    Comment: (NOTE) SARS-CoV-2 target nucleic acids are NOT DETECTED.  The SARS-CoV-2 RNA is generally detectable in upper respiratory specimens during the acute phase of infection. The lowest concentration of SARS-CoV-2 viral copies this assay can detect is 138 copies/mL. A negative result does not preclude SARS-Cov-2 infection and should not be used as the sole basis for treatment or other patient management decisions. A negative result may occur with  improper specimen collection/handling, submission of specimen other than nasopharyngeal swab, presence of viral mutation(s) within the areas targeted by this assay, and inadequate number of viral copies(<138 copies/mL). A negative result must be combined with clinical observations, patient history, and epidemiological information. The expected result is Negative.  Fact Sheet for Patients:  BloggerCourse.com  Fact Sheet for Healthcare Providers:  SeriousBroker.it  This test is no t yet approved or cleared by the [place]  FDA and  has been authorized for detection and/or diagnosis of SARS-CoV-2 by FDA under an Emergency Use Authorization (EUA). This EUA will remain  in effect (meaning this test can be used) for the duration of the COVID-19 declaration under Section 564(b)(1) of the Act, 21 U.S.C.section 360bbb-3(b)(1), unless the authorization is terminated  or revoked sooner.       Influenza A by PCR NEGATIVE NEGATIVE Final   Influenza B by PCR NEGATIVE NEGATIVE Final    Comment: (NOTE) The Xpert Xpress SARS-CoV-2/FLU/RSV plus assay is  intended as an aid in the diagnosis of influenza from Nasopharyngeal swab specimens and should not be used as a sole basis for treatment. Nasal washings and aspirates are unacceptable for Xpert Xpress SARS-CoV-2/FLU/RSV testing.  Fact Sheet for Patients: BloggerCourse.com  Fact Sheet for Healthcare Providers: SeriousBroker.it  This test is not yet approved or cleared by the [place]  FDA and has been authorized for detection and/or diagnosis of SARS-CoV-2 by FDA under an Emergency Use Authorization (EUA). This EUA will remain in effect (meaning this test can be used) for the duration of the COVID-19 declaration under Section 564(b)(1) of the Act, 21 U.S.C. section 360bbb-3(b)(1), unless the authorization is terminated or revoked.     Resp Syncytial Virus by PCR NEGATIVE NEGATIVE Final    Comment: (NOTE) Fact Sheet for Patients: BloggerCourse.com  Fact Sheet for Healthcare Providers: SeriousBroker.it  This test is not yet approved or cleared by the [place]  FDA and has been authorized for detection and/or diagnosis of SARS-CoV-2 by FDA under an Emergency Use Authorization (EUA). This EUA will remain in effect (meaning this test can be used) for the duration of the COVID-19 declaration under Section 564(b)(1) of the Act, 21 U.S.C. section 360bbb-3(b)(1), unless the authorization is terminated or revoked.  Performed at [facility], [address] [place], [place] [address]   MRSA Next Gen by PCR, Nasal     Status: None   Collection Time: 12/30/23  5:16 PM   Specimen: Nasal Mucosa; Nasal Swab  Result Value Ref Range Status   MRSA by PCR Next Gen NOT DETECTED NOT DETECTED Final    Comment: (NOTE) The GeneXpert MRSA Assay (FDA approved for NASAL specimens only), is one component of a comprehensive MRSA colonization surveillance program. It is not  intended to diagnose MRSA  infection nor to guide or monitor treatment for MRSA infections. Test performance is not FDA approved in patients less than 39 years old. Performed at [facility], [address] [place], [place] [address]          Radiology Studies: No results found.      Scheduled Meds:  Chlorhexidine  Gluconate Cloth  6 each Topical Daily   feeding supplement (PROSource TF20)  60 mL Per Tube Daily   free water   200 mL Per Tube Q8H   guaiFENesin -dextromethorphan   10 mL Per Tube Q6H   insulin  aspart  0-9 Units Subcutaneous Q4H   lactulose   20 g Per Tube TID   levothyroxine   150 mcg Per Tube Once per day on Sunday Saturday   levothyroxine   300 mcg Per Tube Once per day on Monday Tuesday Wednesday Thursday Friday   midodrine   10 mg Per Tube TID WC   mouth rinse  15 mL Mouth Rinse 4 times per day   pantoprazole  (PROTONIX ) IV  40 mg Intravenous Q12H   sodium chloride  flush  10-40 mL Intracatheter Q12H   Continuous Infusions:  feeding supplement (VITAL 1.5 CAL) Stopped (01/07/24 0500)   piperacillin -tazobactam (ZOSYN )  IV Stopped (01/09/24 0114)          Audria Leather, MD [facility] 01/09/2024, 7:20 AM

## 2024-01-09 NOTE — Progress Notes (Signed)
 Nutrition Follow-up  DOCUMENTATION CODES:   Obesity unspecified  INTERVENTION:  - Restart tube feeding below via Cortrak: Vital 1.5 at 60 ml/h (1440 ml per day) Prosource TF20 60 ml daily Provides 2240 kcal, 117 gm protein, 1100 ml free water  daily   - FWF per CCM/MD. Currently ordered Q8H which in addition to tube feeds provides 1773mL/day   - Monitor weights.   NUTRITION DIAGNOSIS:   Inadequate oral intake related to inability to eat as evidenced by NPO status. *ongoing  GOAL:   Patient will meet greater than or equal to 90% of their needs *progressing, restarting tube feeds  MONITOR:   Diet advancement, Labs, Weight trends, TF tolerance  REASON FOR ASSESSMENT:   Consult Enteral/tube feeding initiation and management  ASSESSMENT:   68 y.o. 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 Admitted for hepatic encephalopathy and septic shock.  4/21 Admit; NPO 4/23 Cortrak placed (tip in stomach); TF initiated 4/26 Reached goal TF 4/29 TF held after patient vomited  Tube feeds noted to be held on 4/29 per [facility] documentation. No documentation as to why.  Discussed with RN who reports family stated the patient had some vomiting on 4/29 so decision was made to hold TF for 24 hours but it has now been 48 hours.   Per discussion with MD and RN, can restart tube feeds today.  TF restarted at 24mL/hr at time of visit. Husband at bedside. Discussed plan to restart and increase back to goal. No questions or concerns.  She continues to have a poor prognosis per MD notes. Goals of care discussions remain ongoing.     Admit weight: 221# Current weight: 229# I&O's: +9.5L since admit + for mild pitting RLE/LLE edema   Medications reviewed and include: Q8H FWF,  Lactulose  TID   Labs reviewed:  Creatinine 9.04 HA1C 4.7 Blood Glucose 108-141 x24 hours   Diet Order:   Diet Order             Diet NPO time specified Except for: Sips with Meds  Diet effective now                   EDUCATION NEEDS:  Education needs have been addressed  Skin:  Skin Assessment: Reviewed RN Assessment  Last BM:  4/30 - rectal tube  Height:  Ht Readings from Last 1 Encounters:  12/30/23 5\' 4"  (1.626 m)   Weight:  Wt Readings from Last 1 Encounters:  01/09/24 104.3 kg   Ideal Body Weight:  54.55 kg  BMI:  Body mass index is 39.47 kg/m.  Estimated Nutritional Needs:  Kcal:  2150-2300 kcals Protein:  100-125 grams Fluid:  >/= 2.1L (or per MD)    Scheryl Cushing RD, LDN Contact via Secure Chat.

## 2024-01-09 NOTE — Consult Note (Signed)
 Consultation Note Date: 01/09/2024   Patient Name: Dana Thornton  DOB: Jun 11, 1956  MRN: [id number]  Age / Sex: 68 y.o., female  PCP: Elester Grim, MD Referring Physician: Audria Leather, MD  Reason for Consultation: Establishing goals of care  HPI/Patient Profile: 68 y.o. 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 Patient admitted to the hospital with multiorgan dysfunction with encephalopathy acute renal failure, decompensation of her liver disease additionally with suspected septic shock possibly from diverticulitis.  Patient already given broad-spectrum antibiotics vasopressors and intermittent albumin  and lactulose  PRBCs in this hospitalization.  Hospital course complicated by worsening renal function, patient did not dialysis candidate, oncology consulted who recommended hospice/comfort measures CODE STATUS changed to DO NOT RESUSCITATE Palliative consult for ongoing goals of care discussions has been requested. Chart reviewed, patient seen, discussed with nursing colleagues, discussed with husband and daughter. Palliative medicine is specialized medical care for people living with serious illness. It focuses on providing relief from the symptoms and stress of a serious illness. The goal is to improve quality of life for both the patient and the family. Goals of care: Broad aims of medical therapy in relation to the patient's values and preferences. Our aim is to provide medical care aimed at enabling patients to achieve the goals that matter most to them, given the circumstances of their particular medical situation and their constraints.    NEXT OF KIN Husband, son and  daughter.  SUMMARY OF RECOMMENDATIONS   Goals of care discussions undertaken with the patient's family present at bedside which included her husband as well as her daughter.  They are well aware of the serious and irreversible nature of the patient's overall condition.  Patient remains critically ill with high risk of ongoing decline and decompensation in spite of current interventions.  She is irreversibly ill and has a high chance of not surviving this hospitalization.  Patient's family is aware.  At present, they wish to cautiously provide you for some symptom management and wish to continue current mode of care such as checking blood work, PRBCs as needed and some routine medical treatments such as tracking renal function etc. are also to be continued at present. Overall, we discussed about opioid rotating from hydromorphone  to fentanyl  with regards to the patient's renal dysfunction and her low blood pressures.  Offered active listening and supportive empathic presence.  Recommend chaplain consult for additional support.  Recommended continuing to support the family and monitoring the patient's symptoms.  Discussed frankly with compassionately with patient's family that at some point in the near future, should the patient start displaying end-of-life signs and symptoms, at that point, patient will benefit from full scope of comfort measures which will include a judicious continued use of opioids for symptom management and that comfort care would also include discontinuation of routine blood work and routine measures not contributing to  comfort measures.  Offered active listening and supportive care, answered all of their questions to the best of my ability.  Palliative will continue to monitor. Thank you for the consult.  Code Status/Advance Care Planning: DNR   Symptom Management:     Palliative Prophylaxis:  Delirium Protocol    Psycho-social/Spiritual:  Desire for further Chaplaincy  support:yes Additional Recommendations: Education on Hospice  Prognosis:  Hours - Days  Discharge Planning: Anticipated Hospital Death      Primary Diagnoses: Present on Admission:  Shock (HCC)   I have reviewed the medical record, interviewed the patient and family, and examined the patient. The following aspects are pertinent.  Past Medical History:  Diagnosis Date   Hypercholesterolemia    Hypertension    Kidney stones    Type II diabetes mellitus (HCC)    Social History   Socioeconomic History   Marital status: Married    Spouse name: Not on file   Number of children: Not on file   Years of education: Not on file   Highest education level: Not on file  Occupational History   Not on file  Tobacco Use   Smoking status: Never   Smokeless tobacco: Never  Substance and Sexual Activity   Alcohol use: No   Drug use: No   Sexual activity: Yes    Partners: Male  Other Topics Concern   Not on file  Social History Narrative   Not on file   Social Drivers of Health   Financial Resource Strain: Not on file  Food Insecurity: No Food Insecurity (12/30/2023)   Hunger Vital Sign    Worried About Running Out of Food in the Last Year: Never true    Ran Out of Food in the Last Year: Never true  Recent Concern: Food Insecurity - Food Insecurity Present (11/13/2023)   Hunger Vital Sign    Worried About Running Out of Food in the Last Year: Sometimes true    Ran Out of Food in the Last Year: Sometimes true  Transportation Needs: No Transportation Needs (12/30/2023)   PRAPARE - Administrator, Civil Service (Medical): No    Lack of Transportation (Non-Medical): No  Physical Activity: Not on file  Stress: Not on file  Social Connections: Socially Integrated (10/10/2023)   Social Connection and Isolation Panel [NHANES]    Frequency of Communication with Friends and Family: More than three times a week    Frequency of Social Gatherings with Friends and Family: Three  times a week    Attends Religious Services: More than 4 times per year    Active Member of Clubs or Organizations: Yes    Attends Engineer, structural: More than 4 times per year    Marital Status: Married   Family History  Problem Relation Age of Onset   Kidney failure Father    Hypertension Father    Scheduled Meds:  amoxicillin -clavulanate  500 mg Per Tube Q24H   Chlorhexidine  Gluconate Cloth  6 each Topical Daily   feeding supplement (PROSource TF20)  60 mL Per Tube Daily   free water   200 mL Per Tube Q8H   guaiFENesin -dextromethorphan   10 mL Per Tube Q6H   insulin  aspart  0-9 Units Subcutaneous Q4H   lactulose   20 g Per Tube TID   levothyroxine   150 mcg Per Tube Once per day on Sunday Saturday   levothyroxine   300 mcg Per Tube Once per day on Monday Tuesday Wednesday Thursday Friday   midodrine   15 mg Per Tube TID WC   mouth rinse  15 mL Mouth Rinse 4 times per day   pantoprazole  (PROTONIX ) IV  40 mg Intravenous Q12H   sodium chloride  flush  10-40 mL Intracatheter Q12H   Continuous Infusions:  feeding supplement (VITAL 1.5 CAL) 20 mL/hr at 01/09/24 1025   PRN Meds:.docusate, fentaNYL  (SUBLIMAZE ) injection, ondansetron  (ZOFRAN ) IV, mouth rinse, oxyCODONE , polyethylene glycol, sodium chloride  flush Medications Prior to Admission:  Prior to Admission medications   Medication Sig Start Date End Date Taking? Authorizing Provider  amLODipine  (NORVASC ) 10 MG tablet Take 1 tablet (10 mg total) by mouth daily. 11/17/17  Yes Emokpae, Courage, MD  carvedilol  (COREG ) 25 MG tablet Take 25 mg by mouth 2 (two) times daily with a meal.   Yes [provider]  ezetimibe  (ZETIA ) 10 MG tablet Take 10 mg by mouth daily.   Yes [provider]  furosemide (LASIX) 40 MG tablet Take 40 mg by mouth daily as needed for fluid or edema.   Yes [provider]  levothyroxine  (SYNTHROID ) 300 MCG tablet Take 150-300 mcg by mouth See admin instructions. Take 150 mcg by  mouth 30 minutes before breakfast on Sunday and Saturday and 300 mcg on Mon/Tues/Wed/Thurs/Fri   Yes [provider]  losartan (COZAAR) 100 MG tablet Take 100 mg by mouth daily.   Yes [provider]  mercaptopurine  (PURINETHOL ) 50 MG tablet Take 50 mg by mouth daily. Give on an empty stomach 1 hour before or 2 hours after meals. Caution: Chemotherapy.   Yes [provider]  ondansetron  (ZOFRAN ) 8 MG tablet Take 1 tablet (8 mg total) by mouth every 8 (eight) hours as needed for nausea or vomiting. 11/12/23  Yes Pasam, Avinash, MD  prochlorperazine  (COMPAZINE ) 10 MG tablet Take 1 tablet (10 mg total) by mouth every 6 (six) hours as needed for nausea or vomiting. 11/12/23  Yes Pasam, Avinash, MD  spironolactone (ALDACTONE) 50 MG tablet Take 50 mg by mouth daily as needed (if BP is high - over 120 top).   Yes [provider]  triamcinolone cream (KENALOG) 0.1 % Apply 1 Application topically 2 (two) times daily as needed (for irritation, as directed- affected area).   Yes [provider]  Vitamin D , Ergocalciferol , (DRISDOL ) 50000 units CAPS capsule Take 50,000 Units by mouth every Sunday.   Yes [provider]  Vitamin K , Phytonadione , 100 MCG TABS Take 10 tablets (1,000 mcg total) by mouth daily at 12 noon. 12/24/23  Yes Pasam, Avinash, MD  insulin  glargine (LANTUS ) 100 UNIT/ML injection 30  Units sq Qhs and 12 units q am Patient not taking: Reported on 12/31/2023 11/17/17   Colin Dawley, MD  insulin  lispro (HUMALOG KWIKPEN) 100 UNIT/ML KwikPen Inject 10 Units into the skin 3 (three) times daily with meals as needed (AS DIRECTED FOR AN ELEVATED BGL). Patient not taking: Reported on 12/31/2023    [provider]  TRULICITY 0.75 MG/0.5ML SOAJ Inject 0.75 mg into the skin every Sunday. Patient not taking: Reported on 12/31/2023    [provider]   Allergies  Allergen Reactions   Ace Inhibitors Cough   Codeine Nausea And Vomiting and  Other (See Comments)    GI upset   Statins Other (See Comments)    Contraindicated due to liver disease   Review of Systems Moans and appears to be in distress Physical Exam Chronically ill-appearing lady resting in bed Deconditioned appearing Appears icteric Has feeding tube Abdomen distended Monitor noted Diminished breath  sounds Moans and appears to display some nonverbal gestures of distress/discomfort  Vital Signs: BP (!) 82/30   Pulse 80   Temp (!) 97.5 F (36.4 C) (Oral)   Resp 10   Ht 5\' 4"  (1.626 m)   Wt 104.3 kg   SpO2 100%   BMI 39.47 kg/m  Pain Scale: CPOT POSS *See Group Information*: S-Acceptable,Sleep, easy to arouse Pain Score: Asleep   SpO2: SpO2: 100 % O2 Device:SpO2: 100 % O2 Flow Rate: .   IO: Intake/output summary:  Intake/Output Summary (Last 24 hours) at 01/09/2024 1311 Last data filed at 01/09/2024 1610 Gross per 24 hour  Intake 727.64 ml  Output 1060 ml  Net -332.36 ml    LBM: Last BM Date : 01/08/24 Baseline Weight: Weight: 85.3 kg Most recent weight: Weight: 104.3 kg     Palliative Assessment/Data:   Palliative performance scale 20%  Time In: 12 Time Out: 1315 Time Total:  75 Greater than 50%  of this time was spent counseling and coordinating care related to the above assessment and plan.  Signed by: Lujean Sake, MD   Please contact Palliative Medicine Team phone at [phone] for questions and concerns.  For individual provider: See Tilford Foley

## 2024-01-09 DEATH — deceased

## 2024-01-10 DIAGNOSIS — R579 Shock, unspecified: Secondary | ICD-10-CM | POA: Diagnosis not present

## 2024-01-10 LAB — CBC
HCT: 23.1 % — ABNORMAL LOW (ref 36.0–46.0)
Hemoglobin: 7.3 g/dL — ABNORMAL LOW (ref 12.0–15.0)
MCH: 32 pg (ref 26.0–34.0)
MCHC: 31.6 g/dL (ref 30.0–36.0)
MCV: 101.3 fL — ABNORMAL HIGH (ref 80.0–100.0)
Platelets: 54 10*3/uL — ABNORMAL LOW (ref 150–400)
RBC: 2.28 MIL/uL — ABNORMAL LOW (ref 3.87–5.11)
RDW: 20.9 % — ABNORMAL HIGH (ref 11.5–15.5)
WBC: 20.8 10*3/uL — ABNORMAL HIGH (ref 4.0–10.5)
nRBC: 6.1 % — ABNORMAL HIGH (ref 0.0–0.2)

## 2024-01-10 LAB — GLUCOSE, CAPILLARY
Glucose-Capillary: 155 mg/dL — ABNORMAL HIGH (ref 70–99)
Glucose-Capillary: 183 mg/dL — ABNORMAL HIGH (ref 70–99)
Glucose-Capillary: 194 mg/dL — ABNORMAL HIGH (ref 70–99)
Glucose-Capillary: 194 mg/dL — ABNORMAL HIGH (ref 70–99)
Glucose-Capillary: 196 mg/dL — ABNORMAL HIGH (ref 70–99)
Glucose-Capillary: 206 mg/dL — ABNORMAL HIGH (ref 70–99)

## 2024-01-10 LAB — COMPREHENSIVE METABOLIC PANEL WITH GFR
ALT: 93 U/L — ABNORMAL HIGH (ref 0–44)
AST: 192 U/L — ABNORMAL HIGH (ref 15–41)
Albumin: <1.5 g/dL — ABNORMAL LOW (ref 3.5–5.0)
Alkaline Phosphatase: 71 U/L (ref 38–126)
Anion gap: 14 (ref 5–15)
BUN: 128 mg/dL — ABNORMAL HIGH (ref 8–23)
CO2: 14 mmol/L — ABNORMAL LOW (ref 22–32)
Calcium: 8.5 mg/dL — ABNORMAL LOW (ref 8.9–10.3)
Chloride: 116 mmol/L — ABNORMAL HIGH (ref 98–111)
Creatinine, Ser: 10.1 mg/dL — ABNORMAL HIGH (ref 0.44–1.00)
GFR, Estimated: 4 mL/min — ABNORMAL LOW (ref >60)
Glucose, Bld: 195 mg/dL — ABNORMAL HIGH (ref 70–99)
Potassium: 5.1 mmol/L (ref 3.5–5.1)
Sodium: 144 mmol/L (ref 135–145)
Total Bilirubin: 6.1 mg/dL — ABNORMAL HIGH (ref 0.0–1.2)
Total Protein: 7.9 g/dL (ref 6.5–8.1)

## 2024-01-10 LAB — CBC WITH DIFFERENTIAL/PLATELET
Abs Immature Granulocytes: 1.2 10*3/uL — ABNORMAL HIGH (ref 0.00–0.07)
Basophils Absolute: 0 10*3/uL (ref 0.0–0.1)
Basophils Relative: 0 %
Eosinophils Absolute: 0.8 10*3/uL — ABNORMAL HIGH (ref 0.0–0.5)
Eosinophils Relative: 4 %
HCT: 23.9 % — ABNORMAL LOW (ref 36.0–46.0)
Hemoglobin: 7.6 g/dL — ABNORMAL LOW (ref 12.0–15.0)
Lymphocytes Relative: 8 %
Lymphs Abs: 1.6 10*3/uL (ref 0.7–4.0)
MCH: 31.9 pg (ref 26.0–34.0)
MCHC: 31.8 g/dL (ref 30.0–36.0)
MCV: 100.4 fL — ABNORMAL HIGH (ref 80.0–100.0)
Metamyelocytes Relative: 2 %
Monocytes Absolute: 0.8 10*3/uL (ref 0.1–1.0)
Monocytes Relative: 4 %
Myelocytes: 4 %
Neutro Abs: 15.7 10*3/uL — ABNORMAL HIGH (ref 1.7–7.7)
Neutrophils Relative %: 78 %
Platelets: 55 10*3/uL — ABNORMAL LOW (ref 150–400)
RBC: 2.38 MIL/uL — ABNORMAL LOW (ref 3.87–5.11)
RDW: 21 % — ABNORMAL HIGH (ref 11.5–15.5)
WBC: 20.1 10*3/uL — ABNORMAL HIGH (ref 4.0–10.5)
nRBC: 4.6 % — ABNORMAL HIGH (ref 0.0–0.2)

## 2024-01-10 MED ORDER — ORAL CARE MOUTH RINSE
15.0000 mL | OROMUCOSAL | Status: DC
  Administered 2024-01-10 – 2024-01-11 (×3): 15 mL via OROMUCOSAL

## 2024-01-10 MED ORDER — ALBUMIN HUMAN 5 % IV SOLN
25.0000 g | Freq: Once | INTRAVENOUS | Status: AC
  Administered 2024-01-10: 25 g via INTRAVENOUS
  Filled 2024-01-10: qty 500

## 2024-01-10 MED ORDER — ORAL CARE MOUTH RINSE: 15.0000 mL | OROMUCOSAL | Status: DC | PRN

## 2024-01-10 NOTE — TOC Progression Note (Signed)
 Transition of Care [facility]) - Progression Note    Patient Details  Name: Dana Thornton MRN: [id number] Date of Birth: 04-Nov-1955  Transition of [facility]) CM/SW Contact  Amaryllis Junior, [place] Phone Number: 01/10/2024, 10:06 AM  Clinical Narrative:    Palliative care consulted and following, pt is now DNR. TOC continuing to follow for dc needs.      Barriers to Discharge: Continued Medical Work up  Expected Discharge Plan and Services In-house Referral: NA     Living arrangements for the past 2 months: Single Family Home                 DME Arranged: N/A DME Agency: NA       HH Arranged: NA HH Agency: NA         Social Determinants of Health (SDOH) Interventions SDOH Screenings   Food Insecurity: No Food Insecurity (12/30/2023)  Recent Concern: Food Insecurity - Food Insecurity Present (11/13/2023)  Housing: Low Risk  (12/30/2023)  Transportation Needs: No Transportation Needs (12/30/2023)  Utilities: Not At Risk (12/30/2023)  Depression (PHQ2-9): Low Risk  (11/27/2023)  Social Connections: Socially Integrated (10/10/2023)  Tobacco Use: Low Risk  (12/30/2023)    Readmission Risk Interventions    12/31/2023   11:15 AM 10/14/2023    9:31 AM  Readmission Risk Prevention Plan  Post Dischage Appt  Complete  Medication Screening  Complete  Transportation Screening Complete Complete  Medication Review (RN Care Manager) Complete   PCP or Specialist appointment within 3-5 days of discharge Complete   HRI or Home Care Consult Complete   SW Recovery Care/Counseling Consult Complete   Palliative Care Screening Not Applicable   Skilled Nursing Facility Not Applicable

## 2024-01-10 NOTE — Progress Notes (Signed)
 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  She was also treated with lactulose ; she required packed red cell transfusion for drop in hemoglobin.  Hospitalization complicated by worsening renal function.  Patient is not a dialysis candidate.  Oncology recommended hospice/comfort measures.  Patient was changed to DNR.  Care transferred to TRH service from 01/06/2024 onwards.  Assessment & Plan:   Acute hepatic encephalopathy/hyperammonemia Autoimmune hepatitis Hepatic cirrhosis with recurrent ascites Stage IV hepatocellular carcinoma Portal hypertension Hyperbilirubinemia Elevated LFTs Hypoalbuminemia - AST and ALT are almost the same over the last few days but bili total is at 6.4 today.  Aldactone and Lasix continue to remain on hold because of hypotension. -Continue lactulose .   - Prognosis remains very poor.  Oncology recommended hospice/comfort measures.  Family understand this but want to continue current course of action including daily blood works and transfusion if needed.  I have explained to the family regarding the futility of the same.  Palliative care evaluation appreciated  Coagulopathy/anemia of chronic disease/thrombocytopenia - Patient has received multiple packed red cells transfusion during this hospitalization.  Hemoglobin 6.5 on 01/08/2024.  Patient received another unit of packed red cells.  Hemoglobin 7.6 this morning; family want to  continue doing daily blood work for now.  Platelets trending downwards as well.  Patient also received fresh frozen plasma during this hospitalization.  Septic shock: Present on admission Acute diverticulitis with possible diverticular abscess - Shock is resolved.  Off pressors.  Care transferred to TRH service from 01/06/2024 onwards.  Blood pressure on the lower side but stable.  Continue midodrine .  Patient received 10 days of Zosyn ; switched to Augmentin  on 01/09/2024.  Completed 14-day course of therapy  Acute kidney injury on chronic kidney disease stage IIIb Acute metabolic acidosis - Baseline creatinine of 1.9-2.2.  Possibly from ATN in the setting of septic shock and diuretic use.  Creatinine continues to worsen and is 10.10 today.  Bicarb is 14 today.  Urine output is almost none.  Not a dialysis candidate.  Family aware.  Want to continue checking kidney function.  Leukocytosis - Worsening.  Monitor.  Blood cultures negative so far.  Hypernatremia - Improved  Diabetes mellitus type 2 with hyperglycemia -continue CBGs with SSI  Hypothyroidism -Continue levothyroxine  per tube  Nutrition -Continue tube feeds per dietary recommendations  Obesity class I - Outpatient follow-up   DVT prophylaxis: SCDs Code Status: DNR Family Communication: Husband and son at bedside Disposition Plan: Status is: Inpatient Remains inpatient appropriate because: Of severity of illness    Consultants: PCCM/oncology.  palliative care  Antimicrobials:  Anti-infectives (From admission, onward)    Start     Dose/Rate Route Frequency Ordered Stop   01/09/24 2200  amoxicillin -clavulanate (AUGMENTIN ) 400-57 MG/5ML suspension 500 mg        500 mg Per Tube Every 24 hours 01/09/24 1252     12/31/23 1000  piperacillin -tazobactam (ZOSYN ) IVPB 3.375 g  Status:  Discontinued        3.375 g 12.5 mL/hr over 240 Minutes Intravenous 2 times daily 12/30/23 1558 01/09/24 1252   12/31/23  1000  linezolid   (ZYVOX ) IVPB 600 mg  Status:  Discontinued        600 mg 300 mL/hr over 60 Minutes Intravenous Every 12 hours 12/30/23 1723 12/31/23 0708   12/30/23 1230  ceFEPIme  (MAXIPIME ) 2 g in sodium chloride  0.9 % 100 mL IVPB        2 g 200 mL/hr over 30 Minutes Intravenous  Once 12/30/23 1218 12/30/23 1337   12/30/23 1230  metroNIDAZOLE  (FLAGYL ) IVPB 500 mg        500 mg 100 mL/hr over 60 Minutes Intravenous  Once 12/30/23 1218 12/30/23 1445   12/30/23 1230  vancomycin  (VANCOCIN ) IVPB 1000 mg/200 mL premix        1,000 mg 200 mL/hr over 60 Minutes Intravenous  Once 12/30/23 1218 12/30/23 1658        Subjective: Patient seen and examined at bedside.  No agitation, fever, vomiting reported. Objective: Vitals:   01/10/24 0400 01/10/24 0500 01/10/24 0600 01/10/24 0700  BP: (!) 100/44 (!) 93/48 (!) 103/52 (!) 96/50  Pulse: 91 91 92 89  Resp: 14 11 14 16   Temp:      TempSrc:      SpO2: 100% 100% 98% 100%  Weight:    104.2 kg  Height:        Intake/Output Summary (Last 24 hours) at 01/10/2024 0728 Last data filed at 01/10/2024 0650 Gross per 24 hour  Intake 290 ml  Output 325 ml  Net -35 ml   Filed Weights   01/08/24 0500 01/09/24 0500 01/10/24 0700  Weight: 104.3 kg 104.3 kg 104.2 kg    Examination:  General: No acute distress.  Remains on room air.  Chronically ill and deconditioned looking.  Looks jaundiced. ENT/neck: Feeding tube present.  No palpable neck masses or elevated JVD noted  respiratory: Bilateral decreased breath sounds at bases with scattered crackles  CVS: Rate mostly controlled; S1 and S2 are heard  abdominal: Soft, obese, nontender, distended mildly; no organomegaly, bowel sounds are heard normally Extremities: No clubbing; bilateral lower extremity edema present  CNS: Still slow to respond.  Poor historian.  No obvious focal deficits noted  lymph: No obvious palpable lymphadenopathy Skin: No obvious petechiae/rashes psych: Showing no signs of agitation.  Flat  affect.   Musculoskeletal: No obvious joint tenderness/erythema  Data Reviewed: I have personally reviewed following labs and imaging studies  CBC: Recent Labs  Lab 01/05/24 0559 01/07/24 0444 01/07/24 1150 01/08/24 0406 01/08/24 1118 01/08/24 1817 01/09/24 0356 01/10/24 0339  WBC 11.2* 16.4*  --  17.4*  --   --  17.7* 20.1*  NEUTROABS  --   --   --   --   --   --   --  15.7*  HGB 7.4* 6.2*   < > 6.5* 7.5* 7.7* 7.9* 7.6*  HCT 21.9* 19.1*   < > 20.1* 24.0* 24.3* 24.7* 23.9*  MCV 96.5 101.1*  --  100.0  --   --  98.8 100.4*  PLT 55* 54*  --  47*  --   --  51* 55*   < > = values in this interval not displayed.   Basic Metabolic Panel: Recent Labs  Lab 01/04/24 0634 01/05/24 0559 01/07/24 0444 01/08/24 0406 01/09/24 0356 01/10/24 0339  NA 142 142 146* 146* 144 144  K 4.1 4.1 4.5 4.5 4.8 5.1  CL 112* 115* 118* 119* 115* 116*  CO2 19* 19* 19* 16* 15* 14*  GLUCOSE 163* 200* 237* 114* 121* 195*  BUN 68* 74* 94* 100* 117* 128*  CREATININE 5.83* 5.52* 6.78* 7.40* 9.04* 10.10*  CALCIUM  8.0* 7.8* 8.2* 8.0* 8.2* 8.5*  PHOS 5.3* 4.6  --   --   --   --    GFR: Estimated Creatinine Clearance: 6.4 mL/min (A) (by C-G formula based on SCr of 10.1 mg/dL (H)). Liver Function Tests: Recent Labs  Lab 01/05/24 0559 01/07/24 0444 01/08/24 0406 01/09/24 0356 01/10/24 0339  AST 181* 177* 180* 192* 192*  ALT 105* 95* 89* 92* 93*  ALKPHOS 75 78 64 66 71  BILITOT 3.9* 4.5* 5.6* 6.4* 6.1*  PROT 7.5 7.7 7.3 7.7 7.9  ALBUMIN  1.7* 1.5* 1.6* 1.6* <1.5*   No results for input(s): "LIPASE", "AMYLASE" in the last 168 hours. Recent Labs  Lab 01/04/24 1023  AMMONIA 35   Coagulation Profile: Recent Labs  Lab 01/04/24 0634  INR 4.0*   Cardiac Enzymes: No results for input(s): "CKTOTAL", "CKMB", "CKMBINDEX", "TROPONINI" in the last 168 hours. BNP (last 3 results) No results for input(s): "PROBNP" in the last 8760 hours. HbA1C: No results for input(s): "HGBA1C" in the last 72  hours. CBG: Recent Labs  Lab 01/09/24 1151 01/09/24 1514 01/09/24 1947 01/09/24 2359 01/10/24 0332  GLUCAP 114* 137* 151* 155* 183*   Lipid Profile: No results for input(s): "CHOL", "HDL", "LDLCALC", "TRIG", "CHOLHDL", "LDLDIRECT" in the last 72 hours. Thyroid Function Tests: No results for input(s): "TSH", "T4TOTAL", "FREET4", "T3FREE", "THYROIDAB" in the last 72 hours. Anemia Panel: No results for input(s): "VITAMINB12", "FOLATE", "FERRITIN", "TIBC", "IRON", "RETICCTPCT" in the last 72 hours. Sepsis Labs: No results for input(s): "PROCALCITON", "LATICACIDVEN" in the last 168 hours.  No results found for this or any previous visit (from the past 240 hours).        Radiology Studies: No results found.      Scheduled Meds:  amoxicillin -clavulanate  500 mg Per Tube Q24H   Chlorhexidine  Gluconate Cloth  6 each Topical Daily   feeding supplement (PROSource TF20)  60 mL Per Tube Daily   free water   200 mL Per Tube Q8H   guaiFENesin -dextromethorphan   10 mL Per Tube Q6H   insulin  aspart  0-9 Units Subcutaneous Q4H   lactulose   20 g Per Tube TID   levothyroxine   150 mcg Per Tube Once per day on Sunday Saturday   levothyroxine   300 mcg Per Tube Once per day on Monday Tuesday Wednesday Thursday Friday   midodrine   15 mg Per Tube TID WC   mouth rinse  15 mL Mouth Rinse 4 times per day   pantoprazole  (PROTONIX ) IV  40 mg Intravenous Q12H   sodium chloride  flush  10-40 mL Intracatheter Q12H   Continuous Infusions:  feeding supplement (VITAL 1.5 CAL) 30 mL/hr at 01/10/24 0600          Audria Leather, MD [facility] 01/10/2024, 7:28 AM

## 2024-01-10 NOTE — Progress Notes (Signed)
 Daily Progress Note   Patient Name: Dana Thornton       Date: 01/10/2024 DOB: 1955-09-22  Age: 68 y.o. MRN#: [id number] Attending Physician: Audria Leather, MD Primary Care Physician: Elester Grim, MD Admit Date: 12/30/2023  Reason for Consultation/Follow-up: Establishing goals of care  Subjective:  Blood Pressure 80s/40s currently, patient episodically at times.  Son and sister present at bedside.  We also updated husband and her daughter.  1 dose of 12.5 mcg off of fentanyl  was given on 5 - 1 - 25 for pain management.  Creatinine 10.   Length of Stay: 11  Current Medications: Scheduled Meds:   amoxicillin -clavulanate  500 mg Per Tube Q24H   Chlorhexidine  Gluconate Cloth  6 each Topical Daily   feeding supplement (PROSource TF20)  60 mL Per Tube Daily   free water   200 mL Per Tube Q8H   guaiFENesin -dextromethorphan   10 mL Per Tube Q6H   insulin  aspart  0-9 Units Subcutaneous Q4H   lactulose   20 g Per Tube TID   levothyroxine   150 mcg Per Tube Once per day on Sunday Saturday   levothyroxine   300 mcg Per Tube Once per day on Monday Tuesday Wednesday Thursday Friday   midodrine   15 mg Per Tube TID WC   mouth rinse  15 mL Mouth Rinse 4 times per day   pantoprazole  (PROTONIX ) IV  40 mg Intravenous Q12H   sodium chloride  flush  10-40 mL Intracatheter Q12H    Continuous Infusions:  feeding supplement (VITAL 1.5 CAL) 1,000 mL (01/10/24 0948)    PRN Meds: docusate, fentaNYL  (SUBLIMAZE ) injection, ondansetron  (ZOFRAN ) IV, mouth rinse, mouth rinse, oxyCODONE , polyethylene glycol, sodium chloride  flush  Physical Exam         Chronically ill and deconditioned appearing Feeding tube present Monitor noted Systolic blood pressure in the 80s, diastolic blood pressure in the 40s Moans  at times Abdomen with mild distention Has bilateral lower extremity edema  Vital Signs: BP (!) 96/50   Pulse 87   Temp (!) 97.5 F (36.4 C) (Axillary)   Resp 15   Ht 5\' 4"  (1.626 m)   Wt 104.2 kg   SpO2 100%   BMI 39.43 kg/m  SpO2: SpO2: 100 % O2 Device: O2 Device: Room Air O2 Flow Rate:    Intake/output summary:  Intake/Output  Summary (Last 24 hours) at 01/10/2024 1316 Last data filed at 01/10/2024 0650 Gross per 24 hour  Intake 290 ml  Output 325 ml  Net -35 ml   LBM: Last BM Date : 01/09/24 Baseline Weight: Weight: 85.3 kg Most recent weight: Weight: 104.2 kg       Palliative Assessment/Data:      Patient Active Problem List   Diagnosis Date Noted   Liver dysfunction 01/02/2024   Acute renal failure superimposed on chronic kidney disease (HCC) 01/02/2024   Coagulopathy (HCC) 12/30/2023   Shock (HCC) 12/30/2023   Abnormal uterine bleeding 12/17/2023   Elevated total protein 12/17/2023   Diverticulitis 11/12/2023   Hepatocellular carcinoma (HCC) 10/29/2023   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) 10/29/2023   Ascites 10/29/2023   Perforation of sigmoid colon due to diverticulitis 10/10/2023   Hepatic cirrhosis (HCC) 10/10/2023   Cholelithiasis 10/10/2023   Hyponatremia 10/10/2023   Normocytic anemia 10/10/2023   Hypothyroidism 10/10/2023   Hyperlipidemia associated with type 2 diabetes mellitus (HCC) 10/10/2023   Hyperkalemia    Hyperglycemia 04/09/2015   Transaminitis 04/09/2015   Protein-calorie malnutrition, severe (HCC) 03/30/2015   Uncontrolled diabetes mellitus type 2 without complications (HCC) 03/29/2015   Insulin  dependent type 2 diabetes mellitus (HCC) 12/07/2011   Hypertension associated with diabetes (HCC) 12/07/2011   Autoimmune hepatitis (HCC) 12/03/2011    Palliative Care Assessment & Plan   Patient Profile:   68 year old lady with a life limiting illness of 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 Patient admitted to the hospital with multiorgan dysfunction with encephalopathy acute renal failure, decompensation of her liver disease additionally with suspected septic shock possibly from diverticulitis.  Patient already given broad-spectrum antibiotics vasopressors and intermittent albumin  and lactulose  PRBCs in this hospitalization.  Hospital course complicated by worsening renal function, patient did not dialysis candidate, oncology consulted who recommended hospice/comfort measures CODE STATUS changed to DO NOT RESUSCITATE Palliative consult for ongoing goals of care discussions has been requested. Assessment: Autoimmune hepatitis, acute hepatic encephalopathy hyperammonemia Hepatic cirrhosis with recurrent ascites in the setting of stage IV hepatocellular carcinoma portal hypertension hyperbilirubinemia hypoalbuminemia Septic shock present on admission secondary to acute diverticulitis with possible diverticular abscess Acute kidney injury  Recommendations/Plan: Continue current mode of care.  Continuing to gently support the family at this time.  Goals are not for comfort focused care.  Patient and family do wish to avoid the pain and suffering but do not want a discontinuation of routine blood work, do not want discontinuation of current mode of care.  Overall, they are aware of the serious incurable irreversible nature of the patient's condition.  Continue IV fentanyl  on an as-needed basis.  Unfortunately, and spite of current interventions, it appears that the patient is at high risk for decline decompensation and is at high risk for not surviving this hospitalization.  We are continuing to gently discuss this with the patient's family present at bedside which includes her husband and son and daughter and sister.    Code Status:    Code Status Orders  (From admission, onward)           Start     Ordered   01/01/24  0912  Do not attempt resuscitation (DNR)- Limited -Do Not Intubate (DNI)  (Code Status)  Continuous       Question Answer Comment  If pulseless and not breathing No CPR or chest compressions.   In Pre-Arrest Conditions (Patient Is Breathing  and Has A Pulse) Do not intubate. Provide all appropriate non-invasive medical interventions. Avoid ICU transfer unless indicated or required.   Consent: Discussion documented in EHR or advanced directives reviewed      01/01/24 0911           Code Status History     Date Active Date Inactive Code Status Order ID Comments User Context   12/30/2023 1621 01/01/2024 0911 Full Code [id number]  Delories Fetter, NP ED   10/10/2023 1942 10/15/2023 1540 Full Code [id number]  Kenny Peals, MD ED   11/12/2017 1638 11/17/2017 1853 Full Code [id number]  Adrienne Horning, MD ED   06/27/2017 1007 07/03/2017 1735 Full Code [id number]  Carmelo Chock, NP ED   04/09/2015 2033 04/13/2015 1554 Full Code [id number]  Maylene Spear, MD Inpatient   03/29/2015 0300 04/01/2015 1411 Full Code [id number]  Chucky Craver, MD Inpatient   12/03/2011 0340 12/10/2011 1759 Full Code [id number]  Deterding, Leitha Purl, MD Inpatient   12/03/2011 0234 12/03/2011 0340 Full Code [id number]  Lewis Red, MD ED       Prognosis:  Hours - Days  Discharge Planning: Anticipated Hospital Death  Care plan was discussed with son and sister at bedside, updated husband and daughter on the phone.   Thank you for allowing the Palliative Medicine Team to assist in the care of this patient.  Mod MDM.      Greater than 50%  of this time was spent counseling and coordinating care related to the above assessment and plan.  Lujean Sake, MD  Please contact Palliative Medicine Team phone at [phone] for questions and concerns.

## 2024-01-11 DIAGNOSIS — R579 Shock, unspecified: Secondary | ICD-10-CM | POA: Diagnosis not present

## 2024-01-11 LAB — CBC WITH DIFFERENTIAL/PLATELET
Abs Immature Granulocytes: 0.48 10*3/uL — ABNORMAL HIGH (ref 0.00–0.07)
Basophils Absolute: 0.1 10*3/uL (ref 0.0–0.1)
Basophils Relative: 0 %
Eosinophils Absolute: 0.3 10*3/uL (ref 0.0–0.5)
Eosinophils Relative: 1 %
HCT: 21.4 % — ABNORMAL LOW (ref 36.0–46.0)
Hemoglobin: 6.7 g/dL — CL (ref 12.0–15.0)
Immature Granulocytes: 3 %
Lymphocytes Relative: 14 %
Lymphs Abs: 2.7 10*3/uL (ref 0.7–4.0)
MCH: 31.9 pg (ref 26.0–34.0)
MCHC: 31.3 g/dL (ref 30.0–36.0)
MCV: 101.9 fL — ABNORMAL HIGH (ref 80.0–100.0)
Monocytes Absolute: 2.8 10*3/uL — ABNORMAL HIGH (ref 0.1–1.0)
Monocytes Relative: 15 %
Neutro Abs: 12.6 10*3/uL — ABNORMAL HIGH (ref 1.7–7.7)
Neutrophils Relative %: 67 %
Platelets: 48 10*3/uL — ABNORMAL LOW (ref 150–400)
RBC: 2.1 MIL/uL — ABNORMAL LOW (ref 3.87–5.11)
RDW: 20.7 % — ABNORMAL HIGH (ref 11.5–15.5)
WBC: 19 10*3/uL — ABNORMAL HIGH (ref 4.0–10.5)
nRBC: 5.5 % — ABNORMAL HIGH (ref 0.0–0.2)

## 2024-01-11 LAB — GLUCOSE, CAPILLARY
Glucose-Capillary: 183 mg/dL — ABNORMAL HIGH (ref 70–99)
Glucose-Capillary: 186 mg/dL — ABNORMAL HIGH (ref 70–99)
Glucose-Capillary: 192 mg/dL — ABNORMAL HIGH (ref 70–99)
Glucose-Capillary: 196 mg/dL — ABNORMAL HIGH (ref 70–99)

## 2024-01-11 LAB — COMPREHENSIVE METABOLIC PANEL WITH GFR
ALT: 86 U/L — ABNORMAL HIGH (ref 0–44)
AST: 187 U/L — ABNORMAL HIGH (ref 15–41)
Albumin: 1.8 g/dL — ABNORMAL LOW (ref 3.5–5.0)
Alkaline Phosphatase: 84 U/L (ref 38–126)
Anion gap: 15 (ref 5–15)
BUN: 144 mg/dL — ABNORMAL HIGH (ref 8–23)
CO2: 14 mmol/L — ABNORMAL LOW (ref 22–32)
Calcium: 8.5 mg/dL — ABNORMAL LOW (ref 8.9–10.3)
Chloride: 114 mmol/L — ABNORMAL HIGH (ref 98–111)
Creatinine, Ser: 10.46 mg/dL — ABNORMAL HIGH (ref 0.44–1.00)
GFR, Estimated: 4 mL/min — ABNORMAL LOW (ref >60)
Glucose, Bld: 207 mg/dL — ABNORMAL HIGH (ref 70–99)
Potassium: 5.2 mmol/L — ABNORMAL HIGH (ref 3.5–5.1)
Sodium: 143 mmol/L (ref 135–145)
Total Bilirubin: 7.1 mg/dL — ABNORMAL HIGH (ref 0.0–1.2)
Total Protein: 7.6 g/dL (ref 6.5–8.1)

## 2024-01-11 LAB — HEMOGLOBIN AND HEMATOCRIT, BLOOD
HCT: 23.5 % — ABNORMAL LOW (ref 36.0–46.0)
Hemoglobin: 7.1 g/dL — ABNORMAL LOW (ref 12.0–15.0)

## 2024-01-11 LAB — PREPARE RBC (CROSSMATCH)

## 2024-01-11 MED ORDER — SODIUM CHLORIDE 0.9% IV SOLUTION: Freq: Once | INTRAVENOUS | Status: AC

## 2024-01-11 MED ORDER — FENTANYL CITRATE PF 50 MCG/ML IJ SOSY
12.5000 ug | PREFILLED_SYRINGE | INTRAMUSCULAR | Status: DC | PRN
  Administered 2024-01-11: 12.5 ug via INTRAVENOUS
  Filled 2024-01-11: qty 1

## 2024-01-11 MED ORDER — MIDODRINE HCL 5 MG PO TABS
20.0000 mg | ORAL_TABLET | Freq: Three times a day (TID) | ORAL | Status: DC
  Administered 2024-01-11 (×2): 20 mg
  Filled 2024-01-11 (×2): qty 4

## 2024-01-11 MED ORDER — ALBUMIN HUMAN 5 % IV SOLN
25.0000 g | Freq: Once | INTRAVENOUS | Status: AC
  Administered 2024-01-11: 25 g via INTRAVENOUS
  Filled 2024-01-11: qty 500

## 2024-01-11 MED ORDER — SODIUM CHLORIDE 0.9 % IV BOLUS
250.0000 mL | Freq: Once | INTRAVENOUS | Status: AC
  Administered 2024-01-11: 250 mL via INTRAVENOUS

## 2024-01-13 ENCOUNTER — Telehealth: Payer: Self-pay | Admitting: Radiation Oncology

## 2024-01-13 LAB — TYPE AND SCREEN
ABO/RH(D): A POS
Antibody Screen: NEGATIVE
Unit division: 0

## 2024-01-13 LAB — BPAM RBC
Blood Product Expiration Date: 202505312359
ISSUE DATE / TIME: 202505030803
Unit Type and Rh: 6200

## 2024-01-13 NOTE — Telephone Encounter (Signed)
 Patient deceased per Epic, referral for Radiation Oncology closed.

## 2024-01-14 ENCOUNTER — Ambulatory Visit: Admitting: Oncology

## 2024-01-14 ENCOUNTER — Other Ambulatory Visit

## 2024-01-14 ENCOUNTER — Ambulatory Visit

## 2024-02-09 NOTE — Progress Notes (Signed)
 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  She was also treated with lactulose ; she required packed red cell transfusion for drop in hemoglobin.  Hospitalization complicated by worsening renal function.  Patient is not a dialysis candidate.  Oncology recommended hospice/comfort measures.  Patient was changed to DNR.  Care transferred to [facility] service from 01/06/2024 onwards.  Assessment & Plan:   Acute hepatic encephalopathy/hyperammonemia Autoimmune hepatitis Hepatic cirrhosis with recurrent ascites Stage IV hepatocellular carcinoma Portal hypertension Hyperbilirubinemia Elevated LFTs Hypoalbuminemia - AST and ALT are almost the same over the last few days but bili total is at 7.1 today.  Aldactone and Lasix continue to remain on hold because of hypotension. -Continue lactulose .   - Prognosis remains very poor.  Oncology recommended hospice/comfort measures.  Family understand this but want to continue current course of action including daily blood works and transfusion if needed.  I have explained to the family regarding the futility of the same.  Palliative care following  Coagulopathy/anemia of chronic disease/thrombocytopenia - Patient has received multiple packed red cells transfusion during this hospitalization.  Hemoglobin 6.5 on 01/08/2024.  Patient received another unit of packed red cells.  Hemoglobin 6.7 this morning; family want to continue doing daily  blood work for now.  Will transfuse 1 unit packed red cells.  Platelets trending downwards as well.  Patient also received fresh frozen plasma during this hospitalization.  Septic shock: Present on admission Acute diverticulitis with possible diverticular abscess - Off pressors.  Care transferred to [facility] service from 01/06/2024 onwards.  Blood pressure continues to be on the lower side.  Increase midodrine  to 20 mg 3 times daily.  Patient received 10 days of Zosyn ; switched to Augmentin  on 01/09/2024.  Complete 14-day course of therapy  Acute kidney injury on chronic kidney disease stage IIIb Acute metabolic acidosis - Baseline creatinine of 1.9-2.2.  Possibly from ATN in the setting of septic shock and diuretic use.  Creatinine continues to worsen and is 10.46 today.  Bicarb is 14 today.  Urine output is almost none.  Not a dialysis candidate.  Family aware.  Want to continue checking kidney function.  Leukocytosis - Still elevated at 19 today.  Monitor.  Blood cultures negative so far.  Hypernatremia - Improved  Diabetes mellitus type 2 with hyperglycemia -continue CBGs with SSI  Hypothyroidism -Continue levothyroxine  per tube  Nutrition -Continue tube feeds per dietary recommendations  Obesity class I - Outpatient follow-up   DVT prophylaxis: SCDs Code Status: DNR Family Communication: Husband and son at bedside Disposition Plan: Status is: Inpatient Remains inpatient appropriate because: Of severity of illness    Consultants: PCCM/oncology.  palliative care  Antimicrobials:  Anti-infectives (From admission, onward)    Start     Dose/Rate Route Frequency Ordered Stop   01/09/24 2200  amoxicillin -clavulanate (AUGMENTIN ) 400-57 MG/5ML suspension 500 mg        500 mg Per Tube Every 24 hours 01/09/24 1252     12/31/23 1000  piperacillin -tazobactam (ZOSYN ) IVPB 3.375 g  Status:  Discontinued        3.375 g 12.5 mL/hr  over 240 Minutes Intravenous 2 times daily 12/30/23 1558  01/09/24 1252   12/31/23 1000  linezolid  (ZYVOX ) IVPB 600 mg  Status:  Discontinued        600 mg 300 mL/hr over 60 Minutes Intravenous Every 12 hours 12/30/23 1723 12/31/23 0708   12/30/23 1230  ceFEPIme  (MAXIPIME ) 2 g in sodium chloride  0.9 % 100 mL IVPB        2 g 200 mL/hr over 30 Minutes Intravenous  Once 12/30/23 1218 12/30/23 1337   12/30/23 1230  metroNIDAZOLE  (FLAGYL ) IVPB 500 mg        500 mg 100 mL/hr over 60 Minutes Intravenous  Once 12/30/23 1218 12/30/23 1445   12/30/23 1230  vancomycin  (VANCOCIN ) IVPB 1000 mg/200 mL premix        1,000 mg 200 mL/hr over 60 Minutes Intravenous  Once 12/30/23 1218 12/30/23 1658        Subjective: Patient seen and examined at bedside.  No seizures, fever or agitation reported. Objective: Vitals:   02/06/2024 0500 02/01/2024 0503 01/30/2024 0600 02/06/2024 0700  BP: (!) 69/29 (!) 86/39 (!) 81/35 (!) 72/34  Pulse: 69 72 72 65  Resp: 18 (!) 21 18 19   Temp:      TempSrc:      SpO2: 100% 100% 100% 100%  Weight:      Height:        Intake/Output Summary (Last 24 hours) at 01/26/2024 0748 Last data filed at 01/17/2024 0700 Gross per 24 hour  Intake 1228.11 ml  Output 1085 ml  Net 143.11 ml   Filed Weights   01/09/24 0500 01/10/24 0700 01/20/2024 0459  Weight: 104.3 kg 104.2 kg 104.4 kg    Examination:  General: Currently on room air.  No distress.  Chronically ill and deconditioned looking.  Looks jaundiced. ENT/neck: Feeding tube present.  No JVD elevation or palpable thyromegaly noted  respiratory: Decreased breath sounds at bases bilaterally with some crackles CVS: S1-S2 heard; rate currently controlled abdominal: Soft, obese, nontender, remains distended; no organomegaly, bowel sounds are heard  extremities: Lower extremity edema present bilaterally; no cyanosis  CNS: Extremely poor historian.  Extremely slow to respond.  No focal deficits noted  lymph: No palpable lymphadenopathy Skin: No obvious lesions/ecchymosis psych: Cannot  assess because of mental status Musculoskeletal: No obvious joint swelling/deformity Data Reviewed: I have personally reviewed following labs and imaging studies  CBC: Recent Labs  Lab 01/08/24 0406 01/08/24 1118 01/08/24 1817 01/09/24 0356 01/10/24 0339 01/10/24 2108 01/23/2024 0345  WBC 17.4*  --   --  17.7* 20.1* 20.8* 19.0*  NEUTROABS  --   --   --   --  15.7*  --  12.6*  HGB 6.5*   < > 7.7* 7.9* 7.6* 7.3* 6.7*  HCT 20.1*   < > 24.3* 24.7* 23.9* 23.1* 21.4*  MCV 100.0  --   --  98.8 100.4* 101.3* 101.9*  PLT 47*  --   --  51* 55* 54* 48*   < > = values in this interval not displayed.   Basic Metabolic Panel: Recent Labs  Lab 01/05/24 0559 01/07/24 0444 01/08/24 0406 01/09/24 0356 01/10/24 0339 01/19/2024 0345  NA 142 146* 146* 144 144 143  K 4.1 4.5 4.5 4.8 5.1 5.2*  CL 115* 118* 119* 115* 116* 114*  CO2 19* 19* 16* 15* 14* 14*  GLUCOSE 200* 237* 114* 121* 195* 207*  BUN 74* 94* 100* 117* 128* 144*  CREATININE 5.52* 6.78* 7.40* 9.04* 10.10* 10.46*  CALCIUM  7.8* 8.2* 8.0* 8.2* 8.5* 8.5*  PHOS 4.6  --   --   --   --   --    GFR: Estimated Creatinine Clearance: 6.1 mL/min (A) (by C-G formula based on SCr of 10.46 mg/dL (H)). Liver Function Tests: Recent Labs  Lab 01/07/24 0444 01/08/24 0406 01/09/24 0356 01/10/24 0339 01/21/2024 0345  AST 177* 180* 192* 192* 187*  ALT 95* 89* 92* 93* 86*  ALKPHOS 78 64 66 71 84  BILITOT 4.5* 5.6* 6.4* 6.1* 7.1*  PROT 7.7 7.3 7.7 7.9 7.6  ALBUMIN  1.5* 1.6* 1.6* <1.5* 1.8*   No results for input(s): "LIPASE", "AMYLASE" in the last 168 hours. Recent Labs  Lab 01/04/24 1023  AMMONIA 35   Coagulation Profile: No results for input(s): "INR", "PROTIME" in the last 168 hours.  Cardiac Enzymes: No results for input(s): "CKTOTAL", "CKMB", "CKMBINDEX", "TROPONINI" in the last 168 hours. BNP (last 3 results) No results for input(s): "PROBNP" in the last 8760 hours. HbA1C: No results for input(s): "HGBA1C" in the last 72  hours. CBG: Recent Labs  Lab 01/10/24 1139 01/10/24 1615 01/10/24 2020 01/17/2024 0032 01/28/2024 0344  GLUCAP 196* 206* 194* 183* 192*   Lipid Profile: No results for input(s): "CHOL", "HDL", "LDLCALC", "TRIG", "CHOLHDL", "LDLDIRECT" in the last 72 hours. Thyroid Function Tests: No results for input(s): "TSH", "T4TOTAL", "FREET4", "T3FREE", "THYROIDAB" in the last 72 hours. Anemia Panel: No results for input(s): "VITAMINB12", "FOLATE", "FERRITIN", "TIBC", "IRON", "RETICCTPCT" in the last 72 hours. Sepsis Labs: No results for input(s): "PROCALCITON", "LATICACIDVEN" in the last 168 hours.  No results found for this or any previous visit (from the past 240 hours).        Radiology Studies: No results found.      Scheduled Meds:  sodium chloride    Intravenous Once   amoxicillin -clavulanate  500 mg Per Tube Q24H   Chlorhexidine  Gluconate Cloth  6 each Topical Daily   feeding supplement (PROSource TF20)  60 mL Per Tube Daily   free water   200 mL Per Tube Q8H   guaiFENesin -dextromethorphan   10 mL Per Tube Q6H   insulin  aspart  0-9 Units Subcutaneous Q4H   lactulose   20 g Per Tube TID   levothyroxine   150 mcg Per Tube Once per day on Sunday Saturday   levothyroxine   300 mcg Per Tube Once per day on Monday Tuesday Wednesday Thursday Friday   midodrine   15 mg Per Tube TID WC   mouth rinse  15 mL Mouth Rinse 4 times per day   pantoprazole  (PROTONIX ) IV  40 mg Intravenous Q12H   sodium chloride  flush  10-40 mL Intracatheter Q12H   Continuous Infusions:  feeding supplement (VITAL 1.5 CAL) 1,000 mL (01/22/2024 0555)          Audria Leather, MD [facility] 01/19/2024, 7:48 AM

## 2024-02-09 NOTE — Progress Notes (Signed)
 Pt unresponsive and apneic.  Unable to palpate carotid pulse.  DNR status confirmed.  Asystole on monitor and confirmed in two leads.  No breath sounds auscultated for full minute.  No heart sounds auscultated for full minute.  Pupils fixed and dilated.  Confirmed by second registered nurse. Patient pronounced dead at:  1340

## 2024-02-09 NOTE — Progress Notes (Signed)
 Palliative Care Progress Note  68 yo with advanced hepatocellular carcinoma, likely approaching end of life, hypotensive despite agressive interventions with multiorgan failure.   Family gathered at bedside, husband, two children, close friends. Dana Thornton is moaning, she is unable to make meaningful conversation or follow commands-more responsive to family than staff.   Family understand the severity of her condition, they understand that the medical assessment is that she will not survive, but they tell me they are people of faith and are hoping for a miracle.  I discussed pain management with them and using frequent low dose boluses when she is moaning and showing non verbal signs of discomfort.They are in agreement.  Her lungs are free of rhonchi or congestion anteriorly, low volumes. Abdomen is not tense. No tense peripheral edema.  Provided support, answered their questions.  Dana Humphreys, DO Palliative Medicine   Time: 35 minutes.

## 2024-02-09 NOTE — Plan of Care (Signed)
  Problem: Education: Goal: Ability to describe self-care measures that may prevent or decrease complications (Diabetes Survival Skills Education) will improve Outcome: Progressing   Problem: Coping: Goal: Ability to adjust to condition or change in health will improve Outcome: Progressing   Problem: Fluid Volume: Goal: Ability to maintain a balanced intake and output will improve Outcome: Progressing   Problem: Health Behavior/Discharge Planning: Goal: Ability to identify and utilize available resources and services will improve Outcome: Progressing Goal: Ability to manage health-related needs will improve Outcome: Progressing   Problem: Metabolic: Goal: Ability to maintain appropriate glucose levels will improve Outcome: Progressing   Problem: Nutritional: Goal: Maintenance of adequate nutrition will improve Outcome: Progressing Goal: Progress toward achieving an optimal weight will improve Outcome: Progressing   Problem: Skin Integrity: Goal: Risk for impaired skin integrity will decrease Outcome: Progressing   Problem: Tissue Perfusion: Goal: Adequacy of tissue perfusion will improve Outcome: Progressing   Problem: Education: Goal: Knowledge of General Education information will improve Description: Including pain rating scale, medication(s)/side effects and non-pharmacologic comfort measures Outcome: Progressing   Problem: Health Behavior/Discharge Planning: Goal: Ability to manage health-related needs will improve Outcome: Progressing   Problem: Clinical Measurements: Goal: Will remain free from infection Outcome: Progressing Goal: Respiratory complications will improve Outcome: Progressing Goal: Cardiovascular complication will be avoided Outcome: Progressing   Problem: Activity: Goal: Risk for activity intolerance will decrease Outcome: Progressing   Problem: Nutrition: Goal: Adequate nutrition will be maintained Outcome: Progressing   Problem:  Coping: Goal: Level of anxiety will decrease Outcome: Progressing   Problem: Elimination: Goal: Will not experience complications related to bowel motility Outcome: Progressing   Problem: Pain Managment: Goal: General experience of comfort will improve and/or be controlled Outcome: Progressing   Problem: Safety: Goal: Ability to remain free from injury will improve Outcome: Progressing   Problem: Skin Integrity: Goal: Risk for impaired skin integrity will decrease Outcome: Progressing   Problem: Clinical Measurements: Goal: Ability to maintain clinical measurements within normal limits will improve Outcome: Not Progressing Goal: Diagnostic test results will improve Outcome: Not Progressing   Problem: Elimination: Goal: Will not experience complications related to urinary retention Outcome: Not Applicable   Cindy S. Bernetta Brilliant BSN, RN, CCRP, CCRN 02/03/2024 1:52 AM

## 2024-02-09 NOTE — Death Summary Note (Addendum)
 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 She was also treated with lactulose ; she required packed red cell transfusion for drop in hemoglobin. Hospitalization complicated by worsening renal function. Patient is not a dialysis candidate. Oncology recommended hospice/comfort measures. Patient was changed to DNR. Care transferred to [facility] service from 01/06/2024 onwards. Her condition didn't improve despite aggressive medical treatment.  She passed away on Jan 19, 2024 at 1340.  Family was at bedside.  Final Diagnoses:   Acute hepatic encephalopathy/hyperammonemia Autoimmune hepatitis Hepatic cirrhosis with recurrent ascites Stage IV hepatocellular carcinoma Portal hypertension Hyperbilirubinemia Elevated LFTs Hypoalbuminemia Coagulopathy/anemia of chronic disease/thrombocytopenia  Septic shock: Present on admission Acute diverticulitis with possible diverticular abscess Acute kidney injury on chronic kidney disease stage IIIb Acute metabolic acidosis Leukocytosis Hyponatremia Diabetes mellitus type 2 with hyperglycemia Hypothyroidism Obesity class I  The results of significant diagnostics from this hospitalization (including imaging, microbiology, ancillary and laboratory) are listed below for reference.    Significant Diagnostic Studies: US  EKG SITE RITE Result Date:  01/02/2024 If Site Rite image not attached, placement could not be confirmed due to current cardiac rhythm.  DG Abd 1 View Result Date: 01/01/2024 CLINICAL DATA:  NG tube placement EXAM: ABDOMEN - 1 VIEW COMPARISON:  None Available. FINDINGS: The bowel gas pattern is normal. No radio-opaque calculi or other significant radiographic abnormality are seen. IMPRESSION: Negative.  Nasogastric tube tip in the body of the stomach Electronically Signed   By: Fredrich Jefferson M.D.   On: 01/01/2024 10:45   DG Chest Port 1 View Result Date: 01-07-2024 CLINICAL DATA:  Questionable sepsis - evaluate for abnormality EXAM: PORTABLE CHEST 1 VIEW COMPARISON:  October 14, 2023 FINDINGS: Markedly low lung volumes with bilateral perihilar interstitial opacities. Borderline cardiomegaly. Tortuous aorta. No pleural effusion or pneumothorax. Multilevel thoracic osteophytosis. IMPRESSION: Markedly low lung volumes. Bilateral perihilar interstitial opacities, which may reflect bronchovascular crowding, atypical/viral infection, or interstitial edema. Electronically Signed   By: Rance Burrows M.D.   On: Jan 07, 2024 14:07   CT ABDOMEN PELVIS WO CONTRAST Result Date: Jan 07, 2024 CLINICAL DATA:  Abdominal pain, acute, nonlocalized EXAM: CT ABDOMEN AND PELVIS WITHOUT CONTRAST TECHNIQUE: Multidetector CT imaging of the abdomen and pelvis was performed following the standard protocol without IV contrast. RADIATION DOSE REDUCTION: This exam was performed according to the departmental dose-optimization program which includes automated exposure control, adjustment of the mA and/or kV according to patient size and/or use of iterative reconstruction technique. COMPARISON:  Multiple, most recently October 10, 2023 FINDINGS: Of note, the lack of intravenous contrast limits evaluation of the solid organ parenchyma and vascularity. Lower chest: Subtle ground-glass opacities in the lung bases. No pleural effusion. No cardiomegaly. Hepatobiliary:  Cirrhotic liver. Similarly appearing hypodense mass in the inferior right hepatic tip, measuring 3.9 x 4.1 cm (previously 4 x 4 cm).Small radiopaque gallstones in a decompressed gallbladder.No intrahepatic or extrahepatic biliary ductal dilation. Pancreas: No mass or main ductal dilation.No peripancreatic inflammation or fluid collection. Spleen: Normal size. No mass. Adrenals/Urinary Tract: Similar appearance of the multiple bilateral renal cysts.  Bilobed soft tissue mass along the upper pole of the right kidney measuring 3.5 cm, likely an adrenal mass. No hydronephrosis or nephrolithiasis. The urinary bladder is completely decompressed. Stomach/Bowel: The stomach is decompressed without focal abnormality. No small bowel wall thickening or inflammation. No small bowel obstruction. Normal appendix. Descending and sigmoid colonic diverticulosis. Persistent small fluid-filled structure in the sigmoid mesocolon (axial 75-78), measuring 2.2 cm, possibly a small fluid collection or a fluid-filled diverticulum. Vascular/Lymphatic: No aortic aneurysm. Diffuse aortoiliac atherosclerosis. No intraabdominal or pelvic lymphadenopathy. Reproductive: Age-related atrophy of the uterus and ovaries. No concerning adnexal mass. Other: Diffuse mesenteric edema. Large volume ascites. Diffuse anasarca. Paraesophageal and perigastric varices. 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 IMPRESSION: 1. Descending and sigmoid colonic diverticulosis. Subtle inflammatory stranding about the distal sigmoid colon (axial 73-80), worrisome for acute diverticulitis. Persistent small fluid-filled structure in the sigmoid mesocolon (axial 75-78), measuring 2.2 cm, possibly a small peridiverticular abscess or fluid-filled diverticulum. 2. Cirrhotic liver with sequelae of portal hypertension. Increased mesenteric edema with worsening large volume ascites. 3. Similarly appearing mass  in the inferior right hepatic tip measuring 3.9 x 4.1 cm (previously, 4 x 4 cm, by my measurement). 4. Unchanged bilobed mass in the right adrenal gland measuring 3.5 cm. 5. Cholecystolithiasis. Electronically Signed   By: Rance Burrows M.D.   On: 12/30/2023 14:05   IR Paracentesis Result Date: 12/25/2023 INDICATION: 24-year-old female with history of hepatocellular carcinoma, with recurrent ascites. Interventional radiology was requested for therapeutic paracentesis. EXAM: ULTRASOUND GUIDED THERAPEUTIC PARACENTESIS MEDICATIONS: 25 cc of 1% lidocaine  COMPLICATIONS: None immediate. PROCEDURE: Informed written consent was obtained from the patient after a discussion of the risks, benefits and alternatives to treatment. A timeout was performed prior to the initiation of the procedure. Initial ultrasound scanning demonstrates a large amount of ascites within the right lower abdominal quadrant. The right lower abdomen was prepped and draped in the usual sterile fashion. 1% lidocaine  was used for local anesthesia. Following this, a 6 Fr Safe-T-Centesis catheter was introduced. An ultrasound image was saved for documentation purposes. The paracentesis was performed. The catheter was removed and a dressing was applied. The patient tolerated the procedure well without immediate post procedural complication. FINDINGS: A total of approximately 3.0 L of clear, straw-colored peritoneal fluid was removed. IMPRESSION: Successful ultrasound-guided paracentesis yielding 3.0 liters of peritoneal fluid. Procedure performed by Lambert Pillion, PA-C Electronically Signed   By: Nicoletta Barrier M.D.   On: 12/25/2023 17:02    Microbiology: No results found for this or any previous visit (from the past 240 hours).   Labs: Basic Metabolic Panel: Recent Labs  Lab 01/07/24 0444 01/08/24 0406 01/09/24 0356 01/10/24 0339 02/05/2024 0345  NA 146* 146* 144 144 143  K 4.5 4.5 4.8 5.1 5.2*  CL 118* 119* 115* 116* 114*  CO2 19* 16* 15*  14* 14*  GLUCOSE 237* 114* 121* 195* 207*  BUN 94* 100* 117* 128* 144*  CREATININE 6.78* 7.40* 9.04* 10.10* 10.46*  CALCIUM  8.2* 8.0* 8.2* 8.5* 8.5*   Liver Function Tests: Recent Labs  Lab 01/07/24 0444 01/08/24 0406 01/09/24 0356 01/10/24 0339 01/30/2024 0345  AST 177* 180* 192* 192* 187*  ALT 95* 89* 92* 93* 86*  ALKPHOS 78 64 66 71 84  BILITOT 4.5* 5.6* 6.4* 6.1* 7.1*  PROT 7.7 7.3 7.7 7.9 7.6  ALBUMIN  1.5* 1.6* 1.6* <1.5* 1.8*   No results for input(s): "LIPASE", "AMYLASE" in the last 168 hours. No results for input(s): "  AMMONIA" in the last 168 hours. CBC: Recent Labs  Lab 01/08/24 0406 01/08/24 1118 01/09/24 0356 01/10/24 0339 01/10/24 2108 01/30/2024 0345 01/18/2024 1108  WBC 17.4*  --  17.7* 20.1* 20.8* 19.0*  --   NEUTROABS  --   --   --  15.7*  --  12.6*  --   HGB 6.5*   < > 7.9* 7.6* 7.3* 6.7* 7.1*  HCT 20.1*   < > 24.7* 23.9* 23.1* 21.4* 23.5*  MCV 100.0  --  98.8 100.4* 101.3* 101.9*  --   PLT 47*  --  51* 55* 54* 48*  --    < > = values in this interval not displayed.   Cardiac Enzymes: No results for input(s): "CKTOTAL", "CKMB", "CKMBINDEX", "TROPONINI" in the last 168 hours. D-Dimer No results for input(s): "DDIMER" in the last 72 hours. BNP: Invalid input(s): "POCBNP" CBG: Recent Labs  Lab 01/10/24 2020 01/25/2024 0032 01/21/2024 0344 02/04/2024 0753 01/25/2024 1142  GLUCAP 194* 183* 192* 196* 186*   Anemia work up No results for input(s): "VITAMINB12", "FOLATE", "FERRITIN", "TIBC", "IRON", "RETICCTPCT" in the last 72 hours. Urinalysis    Component Value Date/Time   COLORURINE AMBER (A) 10/10/2023 1649   APPEARANCEUR HAZY (A) 10/10/2023 1649   LABSPEC 1.045 (H) 10/10/2023 1649   PHURINE 5.0 10/10/2023 1649   GLUCOSEU NEGATIVE 10/10/2023 1649   HGBUR NEGATIVE 10/10/2023 1649   BILIRUBINUR SMALL (A) 10/10/2023 1649   KETONESUR NEGATIVE 10/10/2023 1649   PROTEINUR NEGATIVE 10/10/2023 1649   UROBILINOGEN 1.0 04/09/2015 2040   NITRITE NEGATIVE  10/10/2023 1649   LEUKOCYTESUR NEGATIVE 10/10/2023 1649   Sepsis Labs Recent Labs  Lab 01/09/24 0356 01/10/24 0339 01/10/24 2108 01/10/2024 0345  WBC 17.7* 20.1* 20.8* 19.0*       SIGNED:  Audria Leather, MD  [facility] 01/12/2024, 2:23 PM

## 2024-02-09 DEATH — deceased

## 2024-02-11 ENCOUNTER — Other Ambulatory Visit

## 2024-02-11 ENCOUNTER — Ambulatory Visit

## 2024-02-11 ENCOUNTER — Ambulatory Visit: Admitting: Oncology
